# Patient Record
Sex: Female | Born: 1945
Health system: Southern US, Community
[De-identification: ages and names within clinical notes are randomized; demographics above are authoritative.]

## PROBLEM LIST (undated history)

## (undated) DIAGNOSIS — I10 Essential (primary) hypertension: Secondary | ICD-10-CM

## (undated) DIAGNOSIS — S62102A Fracture of unspecified carpal bone, left wrist, initial encounter for closed fracture: Secondary | ICD-10-CM

## (undated) DIAGNOSIS — F329 Major depressive disorder, single episode, unspecified: Secondary | ICD-10-CM

## (undated) DIAGNOSIS — J449 Chronic obstructive pulmonary disease, unspecified: Secondary | ICD-10-CM

## (undated) DIAGNOSIS — S82892B Other fracture of left lower leg, initial encounter for open fracture type I or II: Secondary | ICD-10-CM

## (undated) DIAGNOSIS — M1990 Unspecified osteoarthritis, unspecified site: Secondary | ICD-10-CM

## (undated) DIAGNOSIS — K219 Gastro-esophageal reflux disease without esophagitis: Secondary | ICD-10-CM

## (undated) DIAGNOSIS — E785 Hyperlipidemia, unspecified: Secondary | ICD-10-CM

## (undated) DIAGNOSIS — F32A Depression, unspecified: Secondary | ICD-10-CM

## (undated) DIAGNOSIS — M719 Bursopathy, unspecified: Secondary | ICD-10-CM

## (undated) DIAGNOSIS — R55 Syncope and collapse: Secondary | ICD-10-CM

## (undated) DIAGNOSIS — I2510 Atherosclerotic heart disease of native coronary artery without angina pectoris: Secondary | ICD-10-CM

## (undated) HISTORY — DX: Other fracture of left lower leg, initial encounter for open fracture type I or II: S82.892B

## (undated) HISTORY — PX: TOTAL KNEE ARTHROPLASTY: SHX125

## (undated) HISTORY — PX: CARDIAC CATHETERIZATION: SHX172

## (undated) HISTORY — DX: Hyperlipidemia, unspecified: E78.5

## (undated) HISTORY — PX: NECK SURGERY: SHX720

## (undated) HISTORY — DX: Fracture of unspecified carpal bone, left wrist, initial encounter for closed fracture: S62.102A

## (undated) HISTORY — PX: VAGINAL HYSTERECTOMY: SUR661

## (undated) HISTORY — DX: Unspecified osteoarthritis, unspecified site: M19.90

## (undated) HISTORY — DX: Gastro-esophageal reflux disease without esophagitis: K21.9

## (undated) HISTORY — DX: Depression, unspecified: F32.A

## (undated) HISTORY — DX: Major depressive disorder, single episode, unspecified: F32.9

## (undated) HISTORY — DX: Bursopathy, unspecified: M71.9

## (undated) HISTORY — DX: Morbid (severe) obesity due to excess calories: E66.01

---

## 1999-06-17 ENCOUNTER — Other Ambulatory Visit: Admission: RE | Admit: 1999-06-17 | Discharge: 1999-06-17 | Payer: Self-pay | Admitting: Internal Medicine

## 2000-02-11 ENCOUNTER — Encounter: Admission: RE | Admit: 2000-02-11 | Discharge: 2000-02-11 | Payer: Self-pay

## 2000-02-19 ENCOUNTER — Encounter: Payer: Self-pay | Admitting: Neurosurgery

## 2000-02-20 ENCOUNTER — Encounter: Payer: Self-pay | Admitting: Neurosurgery

## 2000-02-20 ENCOUNTER — Inpatient Hospital Stay (HOSPITAL_COMMUNITY): Admission: RE | Admit: 2000-02-20 | Discharge: 2000-02-21 | Payer: Self-pay | Admitting: Neurosurgery

## 2000-04-27 ENCOUNTER — Encounter: Admission: RE | Admit: 2000-04-27 | Discharge: 2000-04-27 | Payer: Self-pay | Admitting: Neurosurgery

## 2000-04-27 ENCOUNTER — Encounter: Payer: Self-pay | Admitting: Neurosurgery

## 2000-05-07 ENCOUNTER — Encounter: Admission: RE | Admit: 2000-05-07 | Discharge: 2000-05-18 | Payer: Self-pay | Admitting: Neurosurgery

## 2001-06-29 ENCOUNTER — Encounter: Payer: Self-pay | Admitting: *Deleted

## 2001-07-01 ENCOUNTER — Inpatient Hospital Stay (HOSPITAL_COMMUNITY): Admission: RE | Admit: 2001-07-01 | Discharge: 2001-07-07 | Payer: Self-pay | Admitting: *Deleted

## 2001-07-01 ENCOUNTER — Encounter: Payer: Self-pay | Admitting: *Deleted

## 2001-09-17 ENCOUNTER — Ambulatory Visit (HOSPITAL_COMMUNITY): Admission: RE | Admit: 2001-09-17 | Discharge: 2001-09-17 | Payer: Self-pay | Admitting: *Deleted

## 2001-11-25 ENCOUNTER — Encounter: Admission: RE | Admit: 2001-11-25 | Discharge: 2001-11-25 | Payer: Self-pay | Admitting: Internal Medicine

## 2001-11-25 ENCOUNTER — Encounter: Payer: Self-pay | Admitting: Internal Medicine

## 2002-01-27 ENCOUNTER — Ambulatory Visit (HOSPITAL_COMMUNITY): Admission: RE | Admit: 2002-01-27 | Discharge: 2002-01-27 | Payer: Self-pay | Admitting: Internal Medicine

## 2002-01-27 ENCOUNTER — Encounter: Payer: Self-pay | Admitting: Internal Medicine

## 2004-02-06 ENCOUNTER — Emergency Department (HOSPITAL_COMMUNITY): Admission: EM | Admit: 2004-02-06 | Discharge: 2004-02-07 | Payer: Self-pay | Admitting: Emergency Medicine

## 2004-02-16 ENCOUNTER — Encounter: Admission: RE | Admit: 2004-02-16 | Discharge: 2004-02-16 | Payer: Self-pay | Admitting: Internal Medicine

## 2004-02-29 ENCOUNTER — Ambulatory Visit (HOSPITAL_COMMUNITY): Admission: RE | Admit: 2004-02-29 | Discharge: 2004-02-29 | Payer: Self-pay | Admitting: Internal Medicine

## 2005-02-11 ENCOUNTER — Encounter: Admission: RE | Admit: 2005-02-11 | Discharge: 2005-02-11 | Payer: Self-pay | Admitting: Internal Medicine

## 2005-11-12 ENCOUNTER — Encounter: Admission: RE | Admit: 2005-11-12 | Discharge: 2005-11-12 | Payer: Self-pay | Admitting: Internal Medicine

## 2007-11-09 ENCOUNTER — Emergency Department (HOSPITAL_COMMUNITY): Admission: EM | Admit: 2007-11-09 | Discharge: 2007-11-09 | Payer: Self-pay | Admitting: Emergency Medicine

## 2008-02-21 ENCOUNTER — Inpatient Hospital Stay (HOSPITAL_COMMUNITY): Admission: EM | Admit: 2008-02-21 | Discharge: 2008-02-23 | Payer: Self-pay | Admitting: Emergency Medicine

## 2008-06-07 ENCOUNTER — Encounter: Admission: RE | Admit: 2008-06-07 | Discharge: 2008-07-12 | Payer: Self-pay | Admitting: Internal Medicine

## 2008-08-25 ENCOUNTER — Encounter (INDEPENDENT_AMBULATORY_CARE_PROVIDER_SITE_OTHER): Payer: Self-pay | Admitting: *Deleted

## 2008-08-25 ENCOUNTER — Ambulatory Visit (HOSPITAL_COMMUNITY): Admission: RE | Admit: 2008-08-25 | Discharge: 2008-08-25 | Payer: Self-pay | Admitting: *Deleted

## 2009-02-25 ENCOUNTER — Emergency Department (HOSPITAL_COMMUNITY): Admission: EM | Admit: 2009-02-25 | Discharge: 2009-02-25 | Payer: Self-pay | Admitting: Emergency Medicine

## 2009-07-16 ENCOUNTER — Encounter: Admission: RE | Admit: 2009-07-16 | Discharge: 2009-07-16 | Payer: Self-pay | Admitting: Internal Medicine

## 2009-09-09 ENCOUNTER — Encounter: Admission: RE | Admit: 2009-09-09 | Discharge: 2009-09-09 | Payer: Self-pay | Admitting: Internal Medicine

## 2009-09-17 ENCOUNTER — Encounter: Admission: RE | Admit: 2009-09-17 | Discharge: 2009-09-17 | Payer: Self-pay | Admitting: Neurosurgery

## 2009-11-20 ENCOUNTER — Encounter: Admission: RE | Admit: 2009-11-20 | Discharge: 2009-11-20 | Payer: Self-pay | Admitting: Neurosurgery

## 2010-06-27 ENCOUNTER — Encounter
Admission: RE | Admit: 2010-06-27 | Discharge: 2010-06-27 | Payer: Self-pay | Source: Home / Self Care | Admitting: Internal Medicine

## 2010-11-12 ENCOUNTER — Other Ambulatory Visit: Payer: Self-pay | Admitting: Internal Medicine

## 2010-11-12 DIAGNOSIS — D1803 Hemangioma of intra-abdominal structures: Secondary | ICD-10-CM

## 2010-11-13 ENCOUNTER — Ambulatory Visit
Admission: RE | Admit: 2010-11-13 | Discharge: 2010-11-13 | Disposition: A | Discharge status: Home / Self Care | Payer: Medicare Other | Source: Ambulatory Visit | Attending: Internal Medicine | Admitting: Internal Medicine

## 2010-11-13 DIAGNOSIS — D1803 Hemangioma of intra-abdominal structures: Secondary | ICD-10-CM

## 2010-11-15 ENCOUNTER — Other Ambulatory Visit: Payer: Self-pay | Admitting: Internal Medicine

## 2010-11-15 ENCOUNTER — Ambulatory Visit
Admission: RE | Admit: 2010-11-15 | Discharge: 2010-11-15 | Disposition: A | Discharge status: Home / Self Care | Payer: PRIVATE HEALTH INSURANCE | Source: Ambulatory Visit | Attending: Internal Medicine | Admitting: Internal Medicine

## 2010-11-15 DIAGNOSIS — R1011 Right upper quadrant pain: Secondary | ICD-10-CM

## 2010-11-17 ENCOUNTER — Emergency Department (HOSPITAL_COMMUNITY): Payer: Medicare Other

## 2010-11-17 ENCOUNTER — Observation Stay (HOSPITAL_COMMUNITY)
Admission: EM | Admit: 2010-11-17 | Discharge: 2010-11-18 | Disposition: A | Discharge status: Home / Self Care | Payer: Medicare Other | Attending: Internal Medicine | Admitting: Internal Medicine

## 2010-11-17 DIAGNOSIS — R61 Generalized hyperhidrosis: Secondary | ICD-10-CM | POA: Insufficient documentation

## 2010-11-17 DIAGNOSIS — R0989 Other specified symptoms and signs involving the circulatory and respiratory systems: Secondary | ICD-10-CM | POA: Insufficient documentation

## 2010-11-17 DIAGNOSIS — E669 Obesity, unspecified: Secondary | ICD-10-CM | POA: Insufficient documentation

## 2010-11-17 DIAGNOSIS — K219 Gastro-esophageal reflux disease without esophagitis: Secondary | ICD-10-CM | POA: Insufficient documentation

## 2010-11-17 DIAGNOSIS — R0609 Other forms of dyspnea: Secondary | ICD-10-CM | POA: Insufficient documentation

## 2010-11-17 DIAGNOSIS — M79609 Pain in unspecified limb: Secondary | ICD-10-CM | POA: Insufficient documentation

## 2010-11-17 DIAGNOSIS — I10 Essential (primary) hypertension: Secondary | ICD-10-CM | POA: Insufficient documentation

## 2010-11-17 DIAGNOSIS — E039 Hypothyroidism, unspecified: Secondary | ICD-10-CM | POA: Insufficient documentation

## 2010-11-17 DIAGNOSIS — E785 Hyperlipidemia, unspecified: Secondary | ICD-10-CM | POA: Insufficient documentation

## 2010-11-17 DIAGNOSIS — R0789 Other chest pain: Principal | ICD-10-CM | POA: Insufficient documentation

## 2010-11-17 DIAGNOSIS — R110 Nausea: Secondary | ICD-10-CM | POA: Insufficient documentation

## 2010-11-17 LAB — DIFFERENTIAL
Lymphocytes Relative: 27 % (ref 12–46)
Lymphs Abs: 3 10*3/uL (ref 0.7–4.0)
Monocytes Absolute: 0.6 10*3/uL (ref 0.1–1.0)
Monocytes Relative: 6 % (ref 3–12)
Neutro Abs: 7.1 10*3/uL (ref 1.7–7.7)

## 2010-11-17 LAB — TSH: TSH: 3.066 u[IU]/mL (ref 0.350–4.500)

## 2010-11-17 LAB — CBC
HCT: 43.8 % (ref 36.0–46.0)
Hemoglobin: 15.3 g/dL — ABNORMAL HIGH (ref 12.0–15.0)
Hemoglobin: 14.9 g/dL (ref 12.0–15.0)
MCH: 30.1 pg (ref 26.0–34.0)
MCHC: 34.9 g/dL (ref 30.0–36.0)
RBC: 5 MIL/uL (ref 3.87–5.11)

## 2010-11-17 LAB — BASIC METABOLIC PANEL
BUN: 14 mg/dL (ref 6–23)
CO2: 28 mEq/L (ref 19–32)
Calcium: 9.4 mg/dL (ref 8.4–10.5)
GFR calc Af Amer: >60 mL/min (ref >60)
GFR calc non Af Amer: >60 mL/min (ref >60)
Glucose, Bld: 136 mg/dL — ABNORMAL HIGH (ref 70–99)

## 2010-11-17 LAB — CK TOTAL AND CKMB (NOT AT ARMC)
CK, MB: 1.4 ng/mL (ref 0.3–4.0)
Relative Index: INVALID (ref 0.0–2.5)

## 2010-11-17 LAB — D-DIMER, QUANTITATIVE: D-Dimer, Quant: 0.88 ug/mL-FEU — ABNORMAL HIGH (ref 0.00–0.48)

## 2010-11-17 LAB — TROPONIN I: Troponin I: 0.03 ng/mL (ref 0.00–0.06)

## 2010-11-17 LAB — BRAIN NATRIURETIC PEPTIDE: Pro B Natriuretic peptide (BNP): <30 pg/mL (ref 0.0–100.0)

## 2010-11-18 ENCOUNTER — Observation Stay (HOSPITAL_COMMUNITY): Payer: Medicare Other

## 2010-11-18 DIAGNOSIS — M79609 Pain in unspecified limb: Secondary | ICD-10-CM

## 2010-11-18 DIAGNOSIS — R079 Chest pain, unspecified: Secondary | ICD-10-CM

## 2010-11-18 LAB — LIPID PANEL
HDL: 44 mg/dL (ref >39)
Total CHOL/HDL Ratio: 4.9 RATIO

## 2010-11-18 MED ORDER — TECHNETIUM TO 99M ALBUMIN AGGREGATED
6.0000 | Freq: Once | INTRAVENOUS | Status: AC | PRN
  Administered 2010-11-18: 6 via INTRAVENOUS

## 2010-11-18 MED ORDER — XENON XE 133 GAS
10.0000 | GAS_FOR_INHALATION | Freq: Once | RESPIRATORY_TRACT | Status: AC | PRN
  Administered 2010-11-18: 10 via RESPIRATORY_TRACT

## 2010-11-22 NOTE — Consult Note (Signed)
NAME:  KALEB, LINQUIST                 ACCOUNT NO.:  [id number]  MEDICAL RECORD NO.:  [id number]           PATIENT TYPE:  O  LOCATION:  2039                         FACILITY:  [facility]  PHYSICIAN:  Noralyn Pick. Eden Emms, MD, FACCDATE OF BIRTH:  [birth year]  DATE OF CONSULTATION: DATE OF DISCHARGE:                                CONSULTATION   Ms. Goyal is a 65-year patient of Dr. Pearson Grippe.  She was admitted to the hospital on November 17, 2010, for atypical chest pain.  The patient has had a history since Thursday she indicates of constant chest pain and the pain she says was unrelenting.  It had a positional component, worse on her left side.  There is no pleuritic component.  Nothing she did seem to make the pain better.  There was no associated shortness of breath, although she is chronically mildly dyspneic with exertion due to her weight.  The patient has mild lower extremity edema at baseline and status post 2 knee replacements.  There has been not any change in the lower extremity edema or leg girth.  She also has significant GI symptoms including some right upper and right mid quadrant pain with nausea.  She apparently saw Dr. Elmyra Ricks PA on the 17th and was referred for an outpatient abdominal ultrasound, which showed hepatic steatosis with hyperechoic lesions in the spleen, gallbladder itself was unremarkable.  She subsequently saw Dr. Selena Batten on the 19th and was going to be referred to a cardiologist but continued to have chest pain and was brought to the hospital by her family.  Since in the hospital, her pain has resolved spontaneously.  She is ruled out for myocardial infarction.  Her V/Q scan was low likelihood of PE, and there were no acute cardiopulmonary abnormalities.  On her chest x-ray, question of previous right apical lung mass on prior exam is not identified  In talking to the patient, she has had GI problems in the past.  She has had GERD with an esophageal dilatation and  this has caused nausea and some pains in her upper epigastrium area and chest in the past.  A 10-point review of systems is otherwise negative and in particular, there has been no melena, no hemoptysis, no syncope, no palpitations.  PAST MEDICAL HISTORY:  Hypertension, hyperlipidemia, arthritis, central obesity, GERD.  PAST SURGICAL HISTORY:  Esophageal dilatation, bilateral knee replacements, diskectomy of the neck, hysterectomy.  SOCIAL HISTORY:  The patient is a nonsmoker, nondrinker.  She is fairly sedentary.  ALLERGIES:  She is allergic to PENICILLIN and SULFA.  She reports having anaphylaxis with IV CONTRAST.  FAMILY HISTORY:  Negative for premature coronary artery disease.  MEDICATIONS:  Here in the hospital including an aspirin a day, Lovenox DVT prophylaxis, Protonix 40 a day, and Norco 2 tablets p.o. q.4 h. p.r.n., Zofran 4 mg IV q.6 h.  PHYSICAL EXAMINATION:  GENERAL:  Remarkable for an obese female in no distress. VITAL SIGNS:  Her blood pressure is 140/80, pulse 85 and regular, respiratory rate 14, afebrile, room air sats 93%. HEENT:  Unremarkable. NECK:  Carotids are normal without bruit.  No lymphadenopathy, thyromegaly, or JVP elevation. LUNGS:  Clear.  Good diaphragmatic motion.  No wheezing. CARDIAC:  S1 and S2 with distant heart sounds.  PMI not palpable. ABDOMEN:  Currently benign.  No hepatosplenomegaly, hepatojugular reflux.  No AAA.  No epigastric pain.  No right upper quadrant tenderness. EXTREMITIES:  Distal pulses are intact with trace edema status post bilateral knee replacements. NEUROLOGIC:  Nonfocal. SKIN:  Warm and dry.  No muscular weakness.  EKG shows sinus rhythm with nonspecific ST-T wave changes.  No acute changes, poor baseline tracing due to patient movement.  Lab work is remarkable for LDL cholesterol of 140, negative CPK x2 with negative troponins.  The D-dimer was mildly elevated at 0.88 but V/Q scan was low prob.  White count  mildly elevated at 11.6, hematocrit 43.4.  BNP less than 30 and hematocrit 43.8.  IMPRESSION: 1. Very atypical chest pain, negative enzymes despite fairly constant     pain since Thursday, positional features as well as GI features,     EKG essentially normal with nonspecific changes and negative     enzymes.  The patient can be discharged home.  She can have     outpatient followup with Dr. Selena Batten.  He may want to reevaluate her     and order an outpatient stress test given the patient's size,     either a dobutamine echo or today Lexiscan scan Myoview would     likely be the best test for her since her V/Q scan was negative and     she is painfree.  I think it is reasonable for the primary service     to discharge her home today. 2. Elevated lipids.  Dietary consultation with low carb diet.  Target     LDL in a patient without significant vascular disease would be 130     or less. 3. History of GERD with esophageal dilatation, should probably have GI     followup to consider repeat endoscopy.  This may be related to her     symptoms.     Noralyn Pick. Eden Emms, MD, [facility]     PCN/MEDQ  D:  11/18/2010  T:  11/19/2010  Job:  [id number]  Electronically Signed by Charlton Haws MD [facility] on 11/22/2010 11:30:09 AM

## 2010-11-25 ENCOUNTER — Encounter (HOSPITAL_COMMUNITY): Payer: Medicare Other | Admitting: Radiology

## 2010-11-27 NOTE — H&P (Signed)
NAME:  Shirley Leblanc, Shirley Leblanc NO.:  [id number]  MEDICAL RECORD NO.:  [id number]           PATIENT TYPE:  E  LOCATION:  [facility]                         FACILITY:  [facility]  PHYSICIAN:  Brendia Sacks, MD    DATE OF BIRTH:  1945-12-15  DATE OF ADMISSION:  11/17/2010 DATE OF DISCHARGE:                             HISTORY & PHYSICAL   PRIMARY CARE PHYSICIAN:  Massie Maroon, MD  CHIEF COMPLAINT:  Chest pain.  HISTORY OF PRESENT ILLNESS:  This 65 year old woman presents to the emergency room with a 5-day history of constant chest pain.  Prior to 5 days ago, the patient has never had chest pain before of significance. She has never had any stress testing done and she has no cardiologist. One week ago, she developed right upper to right mid quadrant pain with nausea.  Although she had nausea for months, this pain was new.  She saw Dr. Elmyra Ricks PA on the 17th and was referred for an outpatient abdominal ultrasound which was completed on the 18th.  This did show hepatic steatosis and 3 hyperechoic lesions of the spleen of unclear etiology. The gallbladder was unremarkable.  About 2 hours after the abdominal ultrasound on the 18th, she developed chest pain.  This was retrosternal in nature with some radiation to her left shoulder and left arm.  She had intermittent shortness of breath which was not necessarily associated with exertion although she did have worsening shortness of breath with exertion.  Pain lasted in intensity about 2 hours although it has been present since Wednesday.  She saw Dr. Selena Batten on the 19th and at that time an EKG was done and was, per the patient, noted to be abnormal and she was referred in the outpatient setting to Cardiology.  However, her pain persisted until today and her daughter insisted that she come to the emergency room for further evaluation.  At its maximum intensity, the pain has been a 5/10, associated with nausea but no vomiting.  She took some  aspirin last night which seemed to help and she was given aspirin in the emergency room which completely relieved her pain.  She is currently painfree.  She has no shortness of breath now.  She has had no diaphoresis.  REVIEW OF SYSTEMS:  Negative for fever, changes to her vision, sore throat, rash, muscle aches, dysuria, or bleeding.  She has had nausea for months now.  She denies any recent travel, prolonged immobility, or risk factors for venous thromboembolism.  PAST MEDICAL HISTORY: 1. Hypertension. 2. Hyperlipidemia. 3. Arthritis. 4. Obesity. 5. GERD. 6. Denies history of venous thromboembolism, MI, CVA, and diabetes.  PAST SURGICAL HISTORY: 1. Esophageal dilatation. 2. Bilateral total knee replacements. 3. Neck diskectomy. 4. Hysterectomy. 5. BTL.  SOCIAL HISTORY:  Nonsmoker, nondrinker.  ALLERGIES:  To PENICILLIN and SULFA which cause hives but not anaphylaxis and an allergy to IV CONTRAST which she describes as being anaphylaxis.  FAMILY HISTORY:  Father had heart disease in his 72s although the age of onset is unknown.  MEDICATIONS:  Pending at this time.  PHYSICAL EXAMINATION:  VITAL SIGNS:  Initial blood pressure 141/92, repeat 99/61, pulse 93-103, respirations 17, temperature 98.1, sat 97%. GENERAL:  Well-developed, well-nourished woman sitting up in bed, in no acute distress.  She appears calm and comfortable and has no pain. HEENT:  Eyes: She wears glasses.  Pupils equal, round, reactive to light.  Lids, irises, and conjunctivae appear unremarkable.  ENT: Hearing is grossly normal.  Lips and tongue appear unremarkable.  She does have dentures. NECK:  Supple.  No lymphadenopathy or masses.  No thyromegaly. CHEST:  Clear to auscultation bilaterally.  No wheezes, rales, or rhonchi.  There is normal respiratory effort. EXTREMITIES:  There is no significant lower extremity edema. ABDOMEN:  Soft, nontender, nondistended.  No masses are appreciated. SKIN:   Normal without rash or indurations.  Nontender to palpation. Tone and strength in the upper and extremities is 5/5 and symmetric. Digits and nails in the upper and lower extremities appear unremarkable. PSYCHIATRY:  Grossly normal mood and affect.  Speech is fluent and appropriate.  ANCILLARY STUDIES:  Independent review of EKG shows sinus tachycardia with a rate of 100, left axis deviation, no acute changes are seen.  IMAGING: 1. Chest x-ray April 22:  Interval possible right apical mass.     Consider further evaluation. 2. CT of the abdomen and pelvis performed prior to admission April 20:     Small fat-containing hernias but was described with erythematous     change of the abdominal aorta with no other acute abnormality seen. 3. Abdominal ultrasound performed April 18:  Probable hepatic     steatosis. 4. Hyperechoic lesions of the spleen.  Possibly multiple splenic     hemangiomata.  Consider MRI for further assessment as metastases     cannot be excluded. 5. Pelvic ultrasound, April 18:  No acute findings identified.  Status     post hysterectomy.  LABORATORY STUDIES: 1. CBC notable for modest leukocytosis of 11.2, hemoglobin is 15.3,     platelet count within normal limits. 2. Basic metabolic panel is notable for potassium of 3.3 and a random     glucose of 136.  First set of cardiac enzymes are negative.  BNP is     less than 30.  ASSESSMENT AND PLAN:  This is a 65 year old woman who presents to the emergency room with 5 days of constant chest pain and initial negative cardiac markers. 1. Atypical chest pain.  The patient has some features of typical     chest pain but also of atypical chest pain.  She has had 5 days of     chest pain with negative cardiac markers.  She is completely     painfree here in the emergency room and has already received     aspirin.  Clinically, I suspect gastroesophageal reflux as she     notes that she has frequent symptoms of this and she  has had an     esophageal dilatation in the past.  However, she has had risk     factors including hypertension and hyperlipidemia, so we will admit     for rule out with cardiac enzymes.  Hopefully can arrange for     stress testing in the morning.  If this is negative, would consider     pursuing outpatient gastroenterologic consultation.  We will place     on PPI therapy here. 2. Gastroesophageal reflux disease with frequent symptoms.  Plan as     above. 3. Splenic lesions seen on imaging, April 18.  Defer further  evaluation to the outpatient setting. 4. Abnormal chest x-ray.  The patient has a history of IV dye     anaphylaxis by report.  We will repeat a 2-view chest x-ray to see     whether this apical lesion persists and if so consider noncontrast     CT. 5. History of hypertension and hyperlipidemia.  These appear to be     stable at this point.  Her systolic blood pressure is in the high     90s.  At this point, she is completely asymptomatic.  We will     continue to follow this. 6. Hypokalemia.  We will replete. 7. The patient has no risk factors for venous thromboembolism.  We     will check a D-dimer as my suspicion is low.  If this is positive,     would consider a V/Q scan but at this point, given low clinical     suspicion, we will simply place her on prophylactic Lovenox.     Brendia Sacks, MD     DG/MEDQ  D:  11/17/2010  T:  11/17/2010  Job:  [id number]  cc:   Massie Maroon, MD  Electronically Signed by Brendia Sacks  on 11/27/2010 09:58:01 PM

## 2010-12-04 NOTE — Discharge Summary (Signed)
NAME:  Shirley Leblanc, Shirley Leblanc                 ACCOUNT NO.:  [id number]  MEDICAL RECORD NO.:  [id number]           PATIENT TYPE:  O  LOCATION:  2039                         FACILITY:  [facility]  PHYSICIAN:  Altha Harm, MDDATE OF BIRTH:  [birth year]  DATE OF ADMISSION:  11/17/2010 DATE OF DISCHARGE:  11/18/2010                              DISCHARGE SUMMARY   DISCHARGE DISPOSITION:  Home.  FINAL DISCHARGE DIAGNOSES: 1. Chest pain, atypical. 2. Hyperlipidemia. 3. Hypertension. 4. Arthritis. 5. Lower extremity pain. 6. Obesity. 7. Gastroesophageal reflux disease. 8. Hypothyroidism.  DISCHARGE MEDICATIONS: 1. Nitroglycerin 0.4 mg sublingual q.5 minutes up to 3 doses as needed     for chest pain. 2. Albuterol inhaler 2 puffs q.4 h p.r.n. shortness of breath. 3. Aspirin 81 mg p.o. daily. 4. Crestor 5 mg p.o. daily. 5. Hydrochlorothiazide 25 mg p.o. daily. 6. Vicodin 5/500 one tab p.o. q.6 h p.r.n. pain. 7. Prilosec 20 mg p.o. daily.8. Promethazine 25 mg p.o. q.6 h p.r.n. nausea. 9. Quinapril 40 mg p.o. daily. 10.Synthroid 50 mcg p.o. daily. 11.Vitamin D3, 4000 units p.o. daily. 12.Zoloft 150 mg p.o. daily.  CONSULTANTS:  [facility].  PROCEDURES:  None.  DIAGNOSTIC STUDIES: 1. V/Q scan which was low probability for PE. 2. Doppler ultrasound of lower extremity showed no evidence of DVTs. 3. Two-view portable chest x-ray which shows no evidence of     cardiopulmonary disease.  PRIMARY CARE PHYSICIAN:  Massie Maroon, MD  CODE STATUS:  Full code.  ALLERGIES: 1. CODEINE. 2. SULFA. 3. PENICILLIN. 4. CONTRAST MEDIA.  CHIEF COMPLAINT:  Chest pain.  HISTORY OF PRESENT ILLNESS:  Please refer to the H and P by Dr. Irene Limbo for details of the HPI.  However, in short, this is a 65 year old lady who presents with atypical chest pain in the setting of known hypertension and hyperlipidemia.  The patient had resolution of her chest pain over the course of  the hospitalization.  Cardiac enzymes were cycled and there were all found to be normal.  The patient did complain of some lower extremity pain and right-sided chest.  A D-dimer was obtained which was mildly elevated. The V/Q scan was obtained which was found to be negative as well as a lower of the lower extremity Doppler which was found to be negative for DVTs.  The patient was seen by Cardiology who recommended that she can have a stress test as an outpatient as she was low risk.  The patient is being discharged home to follow up with her primary care physician within 5 days and to follow up as an outpatient scheduled for stress test as an outpatient setting.  Otherwise, the patient has remained stable.  As of today, her chest pain is completely resolved.  Condition at time of discharge is stable.  PHYSICAL EXAMINATION:  VITAL SIGNS:  Temperature is 97.6, heart rate 85, blood pressure 140/83, respiratory rate 18, and O2 saturation of 98% on room air. HEENT:  She is normocephalic and atraumatic.  Pupils equally round and reactive to light and accommodation.  Extraocular movements are intact. Oropharynx is moist.  No exudate, erythema,  or lesions noted. LUNGS:  Clear to auscultation.  No wheezing or rhonchi noted. CARDIOVASCULAR:  She has got normal S1 and S2.  No murmurs, rubs, or gallops noted.  She has normal sinus rhythm on the monitor. ABDOMEN:  Obese, soft, nontender, and nondistended.  No masses.  No hepatosplenomegaly. EXTREMITIES:  No clubbing, cyanosis, or edema. NEUROLOGIC:  The patient has no focal neurological deficits.  Cranial nerves II through XII grossly intact. PSYCHIATRIC:  She is alert and oriented x3.  Good insight and cognition. Good recent and remote recall.  DISCHARGE INSTRUCTIONS:  The patient is to follow up with her primary care physician, Dr. Selena Batten, in 5 days.  In terms of Cardiology, an appointment will be made for the outpatient stress test at  [facility] prior to discharge.  DIETARY RESTRICTIONS:  The patient should be on a heart-healthy diet.  Total Time for Discharge including Face-to-Face time approximately 45 minutes.     Altha Harm, MD     MAM/MEDQ  D:  11/18/2010  T:  11/19/2010  Job:  [id number]  cc:   Massie Maroon, MD  Electronically Signed by Marthann Schiller MD on 12/04/2010 02:08:52 PM

## 2010-12-10 NOTE — H&P (Signed)
NAME:  Shirley, Leblanc                 ACCOUNT NO.:  [id number]   MEDICAL RECORD NO.:  [id number]          PATIENT TYPE:  INP   LOCATION:  0112                         FACILITY:  [facility]   PHYSICIAN:  Hind I Elsaid, MD      DATE OF BIRTH:  03-11-1946   DATE OF ADMISSION:  02/21/2008  DATE OF DISCHARGE:                              HISTORY & PHYSICAL   CHIEF COMPLAINT:  Generalized body weakness, nausea, vomiting, and  diarrhea.   HISTORY OF PRESENT ILLNESS:  This is a 65 year old female with a history  of hypertension.  Patient apparently went to the beach two weeks ago.  At that time, she admitted she started feeling weak and coughing.  Lots  of her family members were there with acute bronchitis but they  improved.  Her symptoms started one week ago, mainly with cough,  nonproductive.  Also onset of nausea, vomiting, and diarrhea.  The  patient used to vomit 4-5 times daily, mainly clear vomitus, associated  with diarrhea, but no blood.  No mucus.  Patient has contacted Dr. Selena Batten.  He ran blood tests.  Everything seemed to be under good control,  including white blood cells, renal function, and thyroid function tests.  At that time, they only found her TSH is 7.5.  At that time, the patient  diagnosed with acute bronchitis.  Patient prescribed antibiotics.  The  patient cannot exactly remember the antibiotic's name but mentioned the  antibiotic could be Biaxin.  Four days from that day, the patient  contacted Dr. Selena Batten again and prescribed a stronger antibiotic.  No  significant improvement on her condition.   The patient today was on her way to work and started feeling sick again  with generalized pain, nausea, and vomiting and had one episode of  diarrhea.  Did call Dr. Selena Batten but he recommend to come to the emergency  room for evaluation.  In the emergency room, the patient found to be  hypotensive with blood pressure of 83/53 and some raise in her  creatinine.  Was advised to be admitted  for evaluation for dehydration.  The patient denies any abdominal pain.  Continued to complain of nausea  and vomiting.  According to her, the diarrhea significantly improved  since last week.  The last time she had diarrhea once a day in the  morning, and since coming to the emergency room, no further diarrhea.  Patient denies shortness of breath.  Denies any chest pain.  Continues  to complain of dry cough.  Denies any fever.  Denies any chills.  Continues to have sweating all day long.  The patient denies any dysuria  or burning micturition.  Denies any normal rash.  Patient denies any  dysuria or burning micturition.  Denies any abnormal rash.  Denies any  headache.  Denies any neck pain.   PAST MEDICAL HISTORY:  1. Hypertension.  2. Hyperlipidemia.  3. Osteoarthritis.  4. Obesity.  5. Gastroesophageal reflux disease.   MEDICATIONS:  1. Accupril.  2. Hydrochlorothiazide.  3. Zoloft 200 mg.  4. Vicodin.   ALLERGIES:  1. PENICILLIN.  2. SULFA.   PAST SURGICAL HISTORY:  1. Hysterectomy.  2. Tubal ligation.  3. Two knee surgeries, back surgeries.  4. Tonsillectomy and adenoidectomy when she was young.   SOCIAL HISTORY:  She lives with her son and his wife and two  grandchildren.  She denies smoking.  Denies any drinking.  Denies any IV  drug abuse.   REVIEW OF SYSTEMS:  As reviewed in the HPI.   FAMILY HISTORY:  Noncontributory.   PHYSICAL EXAMINATION:  VITAL SIGNS:  Temperature 98.1, blood pressure  94/62 after IV fluids, pulse rate 81, respiratory rate 16, saturating  100% on room air.  HEENT:  Patient not in respiratory distress or shortness of breath but  in pain.  Pupils are equal, round and reactive to light and  accommodation.  Extraocular muscle movement within normal.  Mucosa  moist.  NECK:  Supple.  No JVD.  No lymphadenopathy.  HEART:  S1 and S2 without any murmur.  LUNGS:  Normal fascicular breathing with equal air entry.  ABDOMEN:  Obese, soft,  nontender.  Bowel sounds positive.  EXTREMITIES :  Peripheral pulses intact.  No lower limb edema.  CNS:  Patient alert and oriented x3 without focal neurological finding.   BLOOD WORK:  Urine microscopy 0-2, sodium 138, potassium 3.1, chloride  94, CO2 32, glucose 120, BUN 22, creatinine 1.61.  Total bilirubin 0.99,  alkaline phosphatase 76, AST 29, ALT 26.  Total protein 7.1.  CBC:  White blood cells 12.1, hemoglobin 14.7, hematocrit 42.2, platelets 285.   CHEST X-RAY:  No active cardiopulmonary disease.   ASSESSMENT/PLAN:  This is a 65 year old female who presented with:  1. Acute bronchitis:  Will treat with oxygen.  Will hold off on      antibiotic.  Patient already received enough antibiotic.  Chest x-      ray did not show any evidence of pneumonia.  2. Nausea, vomiting, and diarrhea:  Will treat with IV fluids.      Symptom management, mainly antiemetics.  Get stool workup.  Would      hold off on antibiotics at this time.  3. Dehydration:  IV fluids.  4. Hypokalemia replacement.  5. Hypotension:  Will bolus with IV fluids and see if responds to IV      fluids.  6. Deep venous thrombosis/gastrointestinal prophylaxis.      Hind Bosie Helper, MD  Electronically Signed     HIE/MEDQ  D:  02/21/2008  T:  02/21/2008  Job:  [id number]

## 2010-12-10 NOTE — Op Note (Signed)
NAME:  Shirley Leblanc, Shirley Leblanc                 ACCOUNT NO.:  [id number]   MEDICAL RECORD NO.:  [id number]          PATIENT TYPE:  AMB   LOCATION:  ENDO                         FACILITY:  [facility]   PHYSICIAN:  Georgiana Spinner, M.D.    DATE OF BIRTH:  1946-01-10   DATE OF PROCEDURE:  08/25/2008  DATE OF DISCHARGE:                               OPERATIVE REPORT   PROCEDURE:  Upper endoscopy with dilation and biopsy.   INDICATIONS:  Dysphagia.   ANESTHESIA:  Fentanyl 100 mcg, Versed 10 mg, Benadryl 25 mg.   PROCEDURE:  With the patient mildly sedated in the left lateral  decubitus position, the Pentax videoscopic endoscope was inserted in the  mouth passed under direct vision through the esophagus, which appeared  normal although the distal esophagus was not well seen due to some edema  there, but we advanced the endoscope into the stomach.  Fundus, body,  antrum,  duodenal bulb, second portion of duodenum were visualized.  All  appeared normal.  From this point, the endoscope was slowly withdrawn  taking circumferential views of duodenal mucosa until the endoscope been  pulled back into stomach and placed in retroflexion to view the stomach  from below.  The endoscope was then straightened.  A guidewire was  passed the endoscope was withdrawn.  Subsequently over the guidewire, 15  and 17 Savary dilators were passed with fluoroscopic control rather  easily.  No heme was seen on either dilator.  With the latter the  guidewire was removed, the endoscope was reinserted into the stomach and  pulled back.  The distal esophagus showed changes of possible Barrett's  photographed and biopsied.  We then withdrew the endoscope taking  circumferential views of remaining esophageal mucosa.  The patient's  vital signs, pulse oximeter remained stable.  The patient tolerated  procedure well without apparent complications.   FINDINGS:  Question of Barrett's esophagus.  Dilation of distal  esophagus to 15 and 17  Savary under fluoroscopic control.   PLAN:  Await biopsy report and clinical response.  The patient will call  me for results and follow-up with me as an outpatient.           ______________________________  Georgiana Spinner, M.D.     GMO/MEDQ  D:  08/25/2008  T:  08/25/2008  Job:  [id number]

## 2010-12-10 NOTE — Discharge Summary (Signed)
NAME:  Shirley Leblanc, Shirley Leblanc                 ACCOUNT NO.:  [id number]   MEDICAL RECORD NO.:  [id number]          PATIENT TYPE:  INP   LOCATION:  1440                         FACILITY:  [facility]   PHYSICIAN:  Herbie Saxon, MDDATE OF BIRTH:  12-07-1945   DATE OF ADMISSION:  02/21/2008  DATE OF DISCHARGE:  02/23/2008                               DISCHARGE SUMMARY   DISCHARGE DIAGNOSES:  1. Acute bronchitis, improved.  2. Acute gastroenteritis, improved.  3. Dehydration, improved.  4. Hypokalemia, improved.  5. Hypotension, resolved.  6. History of hyperlipidemia.  7. Osteoarthritis.  8. Morbid obesity.  9. Gastroesophageal reflux disease.   HOSPITAL COURSE:  This 65 year old lady was admitted with nausea,  vomiting, diarrhea, generalized body weakness, cough productive of clear  phlegm.  The patient was profoundly hypotensive and dehydrated on  admission.  She has been successfully resuscitated with IV fluid  hydration.  Potassium has been supplemented.  Diuretic is on hold.  The  patient was noticed to have a prolonged QT on EKG.  It is felt it  might  be secondary to hypokalemia, and this has been repleted.  She has been  placed on statins (Zocor) for hyperlipidemia.  She is very anxious to be  discharged, and her primary care physician, Dr. Selena Batten, visited her today.  There is much improvement, no nausea, and her energy level is also  improved.   DISCHARGE CONDITION:  Stable.   DIET:  Heart healthy, low cholesterol.   ACTIVITY:  No restrictions.   FOLLOW UP:  With her primary care physician, Dr. Selena Batten 02/24/2008.  She is  to have a repeat potassium level at that time.   MEDICATIONS ON DISCHARGE:  1. Accupril 2.5 mg daily.  2. HCTZ 0.5 mg daily.  3. Blood pressure medications to be reviewed, and treated as an      outpatient by the primary care physician.  4. KCl 20 mEq daily for the next one week, and this potassium therapy      is also to be reviewed as an outpatient by the  primary care      physician.  5. Continue with the Zoloft 20 mg daily.  6. Start her on Cipro 500 mg b.i.d. for the next three days.  7. Zocor 20 mg q.h.s.  8. Tylenol 60 mg q 6 hours p.r.n.  9. Motrin 40 mg q 8 hours p.r.n.   On examination today, she is an elderly lady, not in acute distress.  Temperature 98, pulse 88, respiratory rate is 18.  Blood pressure is  131/72.  Pupils are equal, reactive to light and accommodation, extraocular  muscles are intact.  Head is atraumatic, normocephalic.  Nasopharynx and  oropharynx are clear.  NECK:  Supple.  There is no elevated JVD, no adenopathy or thyromegaly.  CHEST:  Clinically clear, heart sounds 1 and 2, regular rate and rhythm.  ABDOMEN:  Benign.  She has truncal obesity, no organomegaly.  She is  alert and oriented to time, place and person.  Power is 5 in all limbs, peripheral pulses present, no pedal edema.  Cranial  nerves II-XII are intact.   LABS:  The WBC is 8, hematocrit 39, platelet count is 197.  Chemistry  shows a sodium of 142, potassium 3.4, chloride 103, bicarbonate 13, BUN  11, creatinine 0.8.  Note that the hemoglobin A1c is 6.2  Borderline  hypoglycemia, rule out type 2 diabetes .  This will be followed up in  the outpatient setting.  I noted a CD toxin was negative.  Fecal  lactoferrin was 2.2.  was negative.  T4 was normal.  Urine culture shows  35,000 colonies of multiple bacteria of multiple types.  Blood culture:  No growth to date.  Total lipids are 251, triglyceride 265, LDL 164, HDL  34.   RADIOLOGY:  Chest x-ray on 02/21/2008 showed no active cardiopulmonary  disease.  The patient's  test, and need to follow up with the primary  care physician.  Potassium level  explained to her.  She verbalizes  understanding.      Herbie Saxon, MD  Electronically Signed     MIO/MEDQ  D:  02/23/2008  T:  02/23/2008  Job:  [id number]   cc:   Massie Maroon, MD  Fax: [phone]

## 2010-12-13 NOTE — H&P (Signed)
[place]. [facility]  Patient:    Shirley Leblanc, Shirley Leblanc. Visit Number: [id number] MRN: [id number]          Service Type: Attending:  Reynolds Bowl, M.D. Dictated by:   Reynolds Bowl, M.D.                           History and Physical  INTRODUCTION:  Mrs. Doorn is a 65 year old lady with chief complaint of severe right knee pain.  She was evaluated in this office a month ago and found to have severe osteoarthritis of her knees.  She had previously undergone Synvisc therapy to no avail.  We discussed total knee arthroplasty which she had in her left knee in October 1997.  She feels that that knee now is essentially asymptomatic.  We discussed the fact there are no guarantees.  There could be complications of infection, phlebitis, pulmonary emboli, loosening, pain, limitation of motion, etc. and she would still like to proceed on with total knee arthroplasty.  PAST HISTORY:  ALLERGIES:  PENICILLIN.  PRIOR SURGERY:  Lumbar surgery in 2001.  Left total knee in October 1997.  REVIEW OF SYSTEMS:  Some urinary frequency and questionable burning.  No cardiovascular or GI symptoms.  DAILY MEDICATIONS:  Vicodin, estradiol, Zoloft, Accupril, Norvasc, furosemide.  FURTHER REVIEW OF SYSTEMS:  Patient relates that she is not current with mammograms or Pap smear.  We discussed that and she plan on scheduling that some time in the future.  PHYSICAL EXAMINATION:  VITAL SIGNS:  She is 5 feet 7 inches, 262 pounds.  Blood pressure 144/90, temperature 98.2, pulse 84, respirations 16.  HEENT:  Has upper dentures.  Her extraocular movements are full.  Her tympanic membranes are normal.  NECK:  There is no carotid bruit, no cervical mass.  She moves her neck well.  CHEST:  Volume is good.  Breath sounds are clear.  HEART:  Regular.  No murmurs heard.  ABDOMEN:  Round, soft, nontender.  No palpable masses.  Bowel sounds are present.  VASCULAR:  Femoral pulses are  vague.  There is no murmur heard.  Dorsalis pedis pulse is excellent and the foot is warm.  EXTREMITIES:  There is no leg edema.  The left knee that underwent total knee arthroplasty in October 1997 has 0-135 degrees of motion and is stable.  The right knee motion is from 7-120 degrees.  It is crepitant and it is painful. There is 1+ varus-valgus laxity and a slight amount of effusion.  LABORATORY DATA:  X-rays show a severe osteoarthritis and varus deformity of the right knee.  IMPRESSION: 1. Right knee osteoarthritis with varus deformity. 2. Hypertension. 3. Obesity.  PLAN:  Right total knee arthroplasty as fully discussed with patient; no guarantees were offered.  Today, I wrote prescriptions for her to use postoperatively.  That would be to take Tylox or Vicodin and Coumadin as per protocol.  I wrote a prescription for her to start outpatient physical therapy a little more than two weeks postoperatively and she plans on having home physical therapy prior to that. Dictated by:   Reynolds Bowl, M.D. Attending:  Reynolds Bowl, M.D. DD:  06/29/01 TD:  06/29/01 Job: [id number]

## 2010-12-13 NOTE — Op Note (Signed)
[place]. [facility]  Patient:    Shirley Leblanc, Shirley Leblanc Visit Number: [id number] MRN: [id number]          Service Type: SUR Location: [id number] Attending Physician:  Maryanna Shape Dictated by:   Reynolds Bowl, M.D. Proc. Date: 07/01/01 Admit Date:  07/01/2001                             Operative Report  PREOPERATIVE DIAGNOSIS: Osteoarthritis, right knee, with varus deformity.  POSTOPERATIVE DIAGNOSIS: Osteoarthritis, right knee, with varus deformity.  OPERATION/PROCEDURE: Right total knee arthroplasty.  SURGEON: Reynolds Bowl, M.D.  ASSISTANT: Cammy Copa, M.D.  ANESTHESIA: General via endotracheal tube and postoperative femoral block.  DESCRIPTION OF PROCEDURE: The patient was given general anesthetic and by IV was given 1 g of vancomycin.  A Foley catheter was started.  Arms were placed in a comfortable position and a roll placed under her right hip.  A proximal pneumatic tourniquet was applied over a stockinette and stabilized with a U-drape.  The right lower extremity was prepped from the toe tips to include the edges of the U-drape, then draped in the usual fashion including the use of two Vi-Drapes.  The knee was placed in flexion and a long anterior incision made and carried down to the level of the prepatella bursa.  At that point soft tissue was dissected medially sufficiently to allow opening of the retinaculum medially.  This incision was then carried on down to the medial side of the patellar tendon and proximally the quadriceps tendon was incised, leaving about a 1/4 inch base of the tendon intact.  This then allowed the patella to be everted and the knee flexed.  There were patellar osteophytes, degenerative changes, and there was articular loss with exposure of bone medially, and there were osteophytes around the femur.  I resected part of the fat pad, did partial medial and lateral meniscectomy, then subperiosteally  elevated the medial capsule and collateral ligament to the posterior medial corner.  Likewise, soft tissues were mobilized laterally.  After exposing the femur and the tibia and resecting the cruciates, I then resected the distal femur in five degrees of valgus using an intramedullary guide.  We then used the sizer and established that the femoral size was #7.  We then marked for about four degrees of external rotation and used the guide to cut the various chamfers.  A notch for the Scorpio component was then made using the guides and cutters and impacter.  Attention was then directed to the tibia.  The tibial plateau was resected between 11-12 mm down from the medial tibial plateau using the intramedullary guide and cutting with five degrees of posterior tilt.  Having accomplished all this we then put in trials and felt that there was some lift-off, therefore did posterior releases using a Cobb osteotome and elevating the periosteum from the femur.  Having done this then it was felt that 12 mm thickness tibial bearing insert was required for good stability. At this point we began to have some problems with flexion and extension, having the tibial tray want to internally rotate with flexion and what was felt to be relieved by further release in some posterolateral capsule as well as recessing the popliteus, and then in order to keep it from being completely free I used suture and sutured it to the right soft tissues.  Then, with flexion and extension this  rotation problem seemed to be under control.  There were some lateral femoral patellar bands which were released.  Some of the synovium was tight laterally and that was released.  I dissected a little bit laterally at the level of the prepatella bursa so that I could ream the patella and stand it perpendicular in the femoral trochlea.  A resurfacing patella prosthesis was used by first freeing up soft tissue around the edges of the  patella all the way around, then measuring the patella to be a little more than 21 mm in thickness.  I resected 10-11 mm and used the guide and decided on patella component size #5.  Peg holes were drilled and then with all the components in place the patella seemed to track quite well.  We then removed the trial components again, copiously irrigated with pulsatile lavage, then cemented the tibial tray, then the femoral component, looked for and removed all excess cement and while this was setting had a trial 12 mm tibial bearing in place and allowed the knee to full in extension and rest that way, and having done that we then went on to cement the patellar button and held it in place using a patellar clamp and removed all excess cement.  We then continued to irrigate and waited until all the cement had hardened. We then removed the trial tibial bearing and made a concerted effort to remove all bits of excess cement and any free bits of cement.  Following this the tibia was subluxed anteriorly.  The tray was completely cleaned and we then inserted the permanent tibial bearing insert #5, 12 mm in thickness. Following this we had good stability and the knee would fall into essentially full flexion and the patella tracked well.  The area was then again irrigated with pulsatile lavage.  The retinaculum was approximated using figure-of-eight sutures of #1 Vicryl.  The subcutaneous tissues were approximated with 2-0 Vicryl and skin edges were held in apposition with metal staples.  Xeroform, 4 x 4s, ABD type dressing followed by Kerlix and Ace wraps were applied.  Just superficial to the retinaculum a suction Hemovac was placed and then exited superolaterally.  Having completed the dressing a knee immobilizer was applied loosely.  Throughout the procedure the wound was intermittently irrigated with pulsatile lavage.  After having cemented the components the proximal pneumatic tourniquet  which had been elevated to 350 mm Hg was let down.  There were a few venous bleeders and these were cauterized.   At the completion of the procedure it was felt that estimated blood loss was about 300, perhaps 350 cc, none was replaced.  Following this Dr. Zoila Shutter was going to do a femoral block. Dictated by:   Reynolds Bowl, M.D. Attending Physician:  Maryanna Shape DD:  07/01/01 TD:  07/01/01 Job: [id number]

## 2010-12-13 NOTE — Discharge Summary (Signed)
[facility]  Patient:    Shirley Leblanc, Shirley Leblanc                        MRN: [id number] Adm. Date:  [id number] Disc. Date: [id number] Attending:  Colon Branch                           Discharge Summary  DIAGNOSES: 1. Paralumbar stenosis L4-5. 2. Left foraminal stenosis L5-S1.  PROCEDURE:  Bilateral 4-5 laminectomies and foraminotomies and left 5-1 semi-hemilaminectomy and foraminotomy with microdissection with microscope.  REASON FOR ADMISSION:  The patient is a 65 year old woman who, over the last six months, had progressive low back and left pain with neurogenic claudication after one block.  Steroid dosepak helped only minimally and temporarily.  Nonsteroidal anti-inflammatories do not help much at all.  MRI was done showing lateral stenosis at 4-5 due to spondylitic change and foraminal stenosis left 5-1 compressing the 5 root.  The patient was brought in for decompression.  HOSPITAL COURSE:   The patient was admitted on the day of surgery and underwent the procedure noted above without complications.  Postop, the patient was transferred to the recovery room and then the floor.  There, she is ambulating limited to the bathroom and back, having significant incisional pain but no leg pain.  On the first postop day, she started eating well, and we will have her ambulate a little more in the hall here, get a little more active, and then after lunch she will be discharged home.  CONDITION:  Stable.  DISCHARGE MEDICATIONS:  Same as preoperatively plus Percocet 1 to 2 p.o. q.4-6h. p.r.n., #51 given; and Flexeril 10 mg p.o. q.8h. p.r.n.  DIET:  As tolerated.  ACTIVITY:  No strenuous activity.  No bending, twisting.  May ambulate ad lib. No sitting longer than 20 to 30 minutes at a time.  No driving for a couple of weeks.  FOLLOWUP:  With me in my office. DD:  02/21/00 TD:  02/24/00 Job: [id number]

## 2010-12-13 NOTE — Op Note (Signed)
[place]. [facility]  Patient:    Shirley Leblanc, Shirley Leblanc                        MRN: [id number] Proc. Date: 02/20/00 Adm. Date:  [id number] Attending:  Colon Branch                           Operative Report  PREOPERATIVE DIAGNOSIS:  Lumbar stenosis L4-5 and foraminal stenosis left L5-S1.  POSTOPERATIVE DIAGNOSIS:  Lumbar stenosis L4-5 and foraminal stenosis left L5-S1.  PROCEDURE:  Bilateral L4-5 semi-hemilaminectomies and foraminotomies, left L5-S1 semi-hemilaminectomy and foraminotomy, microdissection with microscope.  SURGEON:  Clydene Fake, M.D.  ASSISTANT:  Bradd Canary., M.D.  ANESTHESIA:  General endotracheal anesthesia.  ESTIMATED BLOOD LOSS:  Minimal.  BLOOD GIVEN:  None.  DRAINS:  None.  COMPLICATIONS:  None.  REASON FOR PROCEDURE:  The patient is a 65 year old woman who has had neurogenic claudication of one block and has been worsening over six months, back and left leg pain in the L5 distribution.  MRI was done showing stenosis of L4-5 due to spinal changes and left foraminal narrowing compressing the L5 root.  The patient brought in for a decompression.  PROCEDURE IN DETAIL:  The patient was brought to the operating room and general anesthesia was induced.  The patient was placed in the prone position on a Wilson frame with all pressure points padded.  The patient was prepped and draped in the sterile fashion.  The site of the incision was injected with 10 cc of 1% lidocaine with epinephrine.  A needle was placed in the midline and a lower lumbar spine x-ray was obtain showing that this was at the L3-4 level.  An incision was then made centered below where the needle was.  The incision was taken down to the fascia and hemostasis was obtained with Bovie cauterization.  The fascia was incised and subperiosteal dissection was done on the left side over the L4 to S1 spinous processes of the lamina up to the facets.  A  marker was placed at the 4-5 interspace.  X-ray obtained showing this was 4-5.  Subperiosteal dissection was done on the right side at L4-5 out to the facet there.  Retractor was placed.  Microscope was brought into the field for microdissection.  A drill was used to perform hemilaminectomy on each side. This was extended with Kerrison punch.  Ligamentum flavum was then removed decompressing the thecal sac.  We then went out laterally removing the hypertrophied ligament and doing foraminotomies both over L4-5 roots.  We did this first on the left side extensively and repeated this on the right side. We explored the disk space.  There was no disk herniation seen.  After this, we had good decompression of the thecal sac and nerve roots bilaterally.  We also used the curets to pull on the ligament that was more cephalad from the bony exposure out to further decompressing the stenosis. Hemostasis was obtained with bipolar cauterization and Gelfoam and thrombin in the epidural space.  Attention was then taken to the L5-S1 interspace on the left where a semi-hemilaminectomy was performed with drill and Kerrison punches.  We did a foraminotomy over the S1 root and explored the disk space.  There was no disk herniation but there was a small bulge there and we left that alone.  We then more importantly started removing the hypertrophied ligament  and some osteophytes from the facet that were impinging the upper foramen that was compressing the fiber.  We opened this up.  We had complete decompression of the left L5 root after this.  Epidural hemostasis was obtained with bipolar cauterization and Gelfoam and thrombin.  All the Gelfoam was then irrigated out and irrigated with antibiotic solution and then the fascia was closed with 0 Vicryl interrupted suture.  The subcutaneous tissues were closed with 3-0 Vicryl interrupted sutures and the skin was closed with Dura-Bond skin glue. Dressing was  placed.  The patient was placed back in the supine position, awakened from anesthesia and transferred to the recovery room in stable condition. DD:  02/20/00 TD:  02/21/00 Job: [id number]

## 2010-12-13 NOTE — H&P (Signed)
[place]. [facility]  Patient:    Shirley Leblanc, Shirley Leblanc                        MRN: [id number] Adm. Date:  [id number] Attending:  Colon Branch                         History and Physical  CHIEF COMPLAINT:  Back and left pain.  HISTORY OF PRESENT ILLNESS:  This patient is a 65 year old woman who has undergone a two-level anterior cervical diskectomy and fusion in 1999, who over the last ix months has been having progressive low back and left leg pain with neurogenic claudication.  Has to stop after about one block.  The pain goes down in the L5  distribution.  No bowel or bladder complaints.  She had been on a steroid dosepak which helped just temporarily and has been on anti-inflammatories but they have not helped much.  An MRI of the lumbar spine was done showing degenerative changes and spondylosis throughout the lumbar spine, most significant at L4-5 with lateral stenosis due to a disk bulging and hypertrophic facets and ligamentous hypertrophy.  There is also a foraminal narrowing of the left L5-S1, compressing the L5 root there.  The patient is admitted for a decompression.  PAST MEDICAL HISTORY: 1. Hypertension. 2. Obesity.  PAST SURGICAL HISTORY: 1. Hysterectomy. 2. Left knee replacement in the [facility].  CURRENT MEDICATIONS: 1. Furosemide. 2. Estrace. 3. Accupril. 4. Zoloft. 5. Percocet.  ALLERGIES:  PENICILLIN which causes a rash.  CODEINE causes itching.  REVIEW OF SYSTEMS:  Otherwise negative.  FAMILY HISTORY:   Noncontributory.  PHYSICAL EXAMINATION:  GENERAL:  The patient is pleasant.  HEENT:  Unremarkable.  NECK:  Supple.  LUNGS:  Clear.  HEART:  A regular rhythm.  ABDOMEN:  Soft, nontender, obese.  EXTREMITIES:  Intact, no edema.  NEUROLOGIC/BACK:  Shows range of motion in the back flexion decreased, in extension which increases her back pain.  Left lateral bending also increases the left leg pain.  Gait is kind of  a shuffling-type of gait.  She has a straight leg raising on the left, negative on the right.  Motor strength is intact.  Sensation slightly  decreased in the left L5 distribution.  Deep tendon reflexes decreased at the knees and ankles bilaterally.  ASSESSMENT:   A patient with lumbar stenosis with L5 radicular component.  The patient is brought in for a decompression. DD:  02/20/00 TD:  02/20/00 Job: [id number]

## 2010-12-13 NOTE — Discharge Summary (Signed)
[place]  Patient:    Shirley Leblanc, Shirley Leblanc Visit Number: [id number] MRN: [id number]          Service Type: SUR Location: [id number] Attending Physician:  Maryanna Shape Dictated by:   Reynolds Bowl, M.D. Admit Date:  07/01/2001 Discharge Date: 07/07/2001                             Discharge Summary  ADMITTING DIAGNOSES: 1. Right knee osteoarthritis with varus deformity. 2. Hypertension. 3. Obesity.  OPERATIVE PROCEDURE:  On July 01, 2001, right total knee replacement with cementing of components utilizing tibial tray size 5, patellar component size 5, femoral component size 7, tibial bearing insert size 5, 12 mm thickness.  HISTORY OF PRESENT ILLNESS:  For history and physical, see that dictated on admission.  HOSPITAL COURSE:  On admission, the patient was taken to the operating room. She underwent right total knee arthroplasty as detailed in the operative note. All components were cemented.  The estimated blood loss was 300 cc of fluid. Pre- and postoperatively she was on prophylactic antibiotics, and she was begun on Coumadin postoperatively.  Her Foley was discontinued the following day, as was her suction Hemovac.  She was progressed with CPM and early ambulation.  By day 4, her INR was 2.5 with a goal of 2.0, her wound appeared fine, and the knee was flexing to approximately 70 degrees.  On December 10, she was flexing to approximately 75 degrees, her wound was felt to be healing well, and plans were made for her to be discharged for home physical therapy, continued Coumadin a total of four weeks per protocol, take Tylox for pain, partially weightbear to tolerance.  She was discharged December 11 after every other staple was removed.  Some of the laboratory data obtained for this admission:  EKG:  Normal sinus rhythm, normal EKG, and sent EKG of February 19, 2000, within normal limits. Initial hemoglobin 13.9, final hemoglobin  8.5.  On admission, her sedimentation rate was 37.  Her INR on discharge was 2.6.  Her admission electrolytes were normal.  Total protein 7.1, the albumin 3.7.  Routine chemistries were normal.  C-reactive protein was 2.8.  Urinalysis normal.  Urine culture:  Multiple species, probable contamination. Dictated by:   Reynolds Bowl, M.D. Attending Physician:  Maryanna Shape DD:  07/29/01 TD:  07/29/01 Job: [id number]

## 2011-03-10 ENCOUNTER — Telehealth: Payer: Self-pay | Admitting: Cardiology

## 2011-03-10 NOTE — Telephone Encounter (Signed)
The patient is scheduled for an appointment on 04/02/11 with Dr. Jens Som for an abnormal EKG. We have received no records on this patient per scheduling. I have left a message for Dr. Elmyra Ricks office at [phone] to send records.

## 2011-03-10 NOTE — Telephone Encounter (Signed)
Per pt call, pt has been having awful sweats when pt is just walking and pt breathing is horrible trying to catch her breath. Pt said she can hardly do anything, go to the grocery store or anything without sweating bad. Pt said her EKG was abnormal the past time she had it. Pt has an appt w/ Crenshaw Sept 5th but wanted to know if she can be seen sooner by either Crenshaw or if she can see another MD. Pt has not seen anyone is practice. Pt husband has since passed but was a previous pt of Dr. Jens Som. Please return pt call to advise/discuss.

## 2011-03-10 NOTE — Telephone Encounter (Signed)
I left a message for the patient to call. 

## 2011-03-11 NOTE — Telephone Encounter (Signed)
Records received from [facility] with pt, she reports not being able to walk from room to room in the house without getting SOB. She denies any edema. She states her medical md wanted her to see [place] sooner. She will see dr wall on Friday this week at 9:30am Shirley Leblanc

## 2011-03-13 ENCOUNTER — Encounter: Payer: Self-pay | Admitting: Cardiology

## 2011-03-13 ENCOUNTER — Encounter: Payer: Self-pay | Admitting: *Deleted

## 2011-03-14 ENCOUNTER — Ambulatory Visit (INDEPENDENT_AMBULATORY_CARE_PROVIDER_SITE_OTHER): Payer: Medicare Other | Admitting: Cardiology

## 2011-03-14 ENCOUNTER — Encounter: Payer: Self-pay | Admitting: Cardiology

## 2011-03-14 VITALS — BP 148/92 | HR 93 | Ht 65.0 in | Wt 276.0 lb

## 2011-03-14 DIAGNOSIS — R0600 Dyspnea, unspecified: Secondary | ICD-10-CM | POA: Insufficient documentation

## 2011-03-14 DIAGNOSIS — R0789 Other chest pain: Secondary | ICD-10-CM

## 2011-03-14 DIAGNOSIS — R0609 Other forms of dyspnea: Secondary | ICD-10-CM

## 2011-03-14 DIAGNOSIS — R0989 Other specified symptoms and signs involving the circulatory and respiratory systems: Secondary | ICD-10-CM

## 2011-03-14 NOTE — Progress Notes (Signed)
HPI Shirley Leblanc is a J89-year-old white female who comes in today referred by Dr. Selena Batten for evaluation of relatively new onset dyspnea on exertion and some mild chest tightness.  He was admitted to the hospital in April with some atypical chest pain. She ruled out for myocardial infarction. She had lower extremity Dopplers which were negative for DVT with a low positive d-dimer. She subsequently had a VQ scan which was low probably of chest x-ray showed no acute cardiopulmonary disease.  The symptoms she is having now are somewhat different than the band. She can only walk for 4 minutes on a treadmill. However, she is very overweight, does not exercise on regular basis, has had 2 total knee replacements.  Her risk factors also include hyperlipidemia currently on statin, hypertension, and age.  She was scheduled for stress test after her hospitalization but canceled it.  Her EKG today shows normal sinus rhythm with a left axis deviation with no acute changes. There's been no change since November 18, 2010.  Also of note she is allergic to contrast. He said the last time she had a contrast load she could not breathe. Past Medical History  Diagnosis Date  . DJD (degenerative joint disease)   . Bursitis   . Hyperlipemia   . Depression   . GERD (gastroesophageal reflux disease)   . Obesities, morbid   . Urinary incontinence     Past Surgical History  Procedure Date  . Neck surgery   . Cardiac catheterization   . Total knee arthroplasty     x2  . Vaginal hysterectomy     Family History  Problem Relation Age of Onset  . Lung disease    . Heart attack      History   Social History  . Marital Status: Widowed    Spouse Name: N/A    Number of Children: 4  . Years of Education: N/A   Occupational History  . RETIRED    Social History Main Topics  . Smoking status: Never Smoker   . Smokeless tobacco: Not on file  . Alcohol Use: No  . Drug Use: No  . Sexually Active: Not on file    Other Topics Concern  . Not on file   Social History Narrative  . No narrative on file    Allergies  Allergen Reactions  . Iodinated Diagnostic Agents   . Penicillins   . Sulfa Antibiotics     Current Outpatient Prescriptions  Medication Sig Dispense Refill  . aspirin 81 MG EC tablet Take 81 mg by mouth daily.        . Cholecalciferol (VITAMIN D3) 2000 UNITS TABS Take 2,000 Int'l Units by mouth daily.        . hydrochlorothiazide (,MICROZIDE/HYDRODIURIL,) 12.5 MG capsule Take 12.5 mg by mouth daily.        Marland Kitchen HYDROcodone-acetaminophen (VICODIN) 5-500 MG per tablet Take 1 tablet by mouth 2 (two) times daily.        Marland Kitchen levothyroxine (SYNTHROID, LEVOTHROID) 50 MCG tablet Take 50 mcg by mouth daily.        Marland Kitchen LORazepam (ATIVAN) 0.5 MG tablet Take 0.5 mg by mouth as needed.        . quinapril (ACCUPRIL) 40 MG tablet Take 40 mg by mouth at bedtime.        . rosuvastatin (CRESTOR) 5 MG tablet Take 5 mg by mouth at bedtime.        . sertraline (ZOLOFT) 100 MG tablet Take 100 mg  by mouth daily. Take 1 and 1/2 tablets daily         ROS No cough, fever, difficulty swallowing, reflux, abdominal discomfort, melena. All other review of systems are negative.   PE General Appearance: well developed, well nourished in no acute distress, morbidly obese HEENT: symmetrical face, PERRLA, dentures Neck: no JVD, thyromegaly, or adenopathy, trachea midline Chest: symmetric without deformity Cardiac: PMI not well appreciated, RRR, normal S1, S2, no gallop or murmur Lung: clear to ausculation and percussion Vascular: all pulses full without bruits  Abdominal: nondistended, nontender, good bowel sounds, no HSM, no bruits Extremities: no cyanosis, clubbing or edema, no sign of DVT, no varicosities  Skin: normal color, no rashes Neuro: alert and oriented x 3, non-focal Pysch: normal affect Filed Vitals:   03/14/11 0856  BP: 148/92  Pulse: 93  Height: 5\' 5"  (1.651 m)  Weight: 276 lb (125.193 kg)     EKG  Labs and Studies Reviewed.   Lab Results  Component Value Date   WBC 11.6* 11/17/2010   HGB 14.9 11/17/2010   HCT 43.4 11/17/2010   MCV 86.8 11/17/2010   PLT 266 11/17/2010      Chemistry      Component Value Date/Time   NA 138 11/17/2010 0945   K 3.3* 11/17/2010 0945   CL 100 11/17/2010 0945   CO2 28 11/17/2010 0945   BUN 14 11/17/2010 0945   CREATININE 0.77 11/17/2010 0945      Component Value Date/Time   CALCIUM 9.4 11/17/2010 0945       Lab Results  Component Value Date   CHOL  Value: 217        ATP III CLASSIFICATION:  <200     mg/dL   Desirable  454-098  mg/dL   Borderline High  >=119    mg/dL   High       * 1/47/8295   Lab Results  Component Value Date   HDL 44 11/18/2010   Lab Results  Component Value Date   LDLCALC  Value: 140        Total Cholesterol/HDL:CHD Risk Coronary Heart Disease Risk Table                     Men   Women  1/2 Average Risk   3.4   3.3  Average Risk       5.0   4.4  2 X Average Risk   9.6   7.1  3 X Average Risk  23.4   11.0        Use the calculated Patient Ratio above and the CHD Risk Table to determine the patient's CHD Risk.        ATP III CLASSIFICATION (LDL):  <100     mg/dL   Optimal  621-308  mg/dL   Near or Above                    Optimal  130-159  mg/dL   Borderline  657-846  mg/dL   High  >962     mg/dL   Very High* 9/52/8413   Lab Results  Component Value Date   TRIG 164* 11/18/2010   Lab Results  Component Value Date   CHOLHDL 4.9 11/18/2010   No results found for this basename: HGBA1C   No results found for this basename: ALT, AST, GGT, ALKPHOS, BILITOT   Lab Results  Component Value Date   TSH 3.066 11/17/2010

## 2011-03-14 NOTE — Patient Instructions (Signed)
Your physician has requested that you have a lexiscan myoview. For further information please visit [email or web address]. Please follow instruction sheet, as given.   

## 2011-03-17 ENCOUNTER — Encounter: Payer: Self-pay | Admitting: Cardiology

## 2011-03-27 ENCOUNTER — Ambulatory Visit (HOSPITAL_COMMUNITY): Payer: Medicare Other | Attending: Cardiology | Admitting: Radiology

## 2011-03-27 DIAGNOSIS — R079 Chest pain, unspecified: Secondary | ICD-10-CM | POA: Insufficient documentation

## 2011-03-27 DIAGNOSIS — R0609 Other forms of dyspnea: Secondary | ICD-10-CM | POA: Insufficient documentation

## 2011-03-27 DIAGNOSIS — I4949 Other premature depolarization: Secondary | ICD-10-CM

## 2011-03-27 DIAGNOSIS — R0989 Other specified symptoms and signs involving the circulatory and respiratory systems: Secondary | ICD-10-CM

## 2011-03-27 DIAGNOSIS — R0602 Shortness of breath: Secondary | ICD-10-CM

## 2011-03-27 DIAGNOSIS — R0789 Other chest pain: Secondary | ICD-10-CM

## 2011-03-27 MED ORDER — TECHNETIUM TC 99M TETROFOSMIN IV KIT
33.0000 | PACK | Freq: Once | INTRAVENOUS | Status: AC | PRN
  Administered 2011-03-27: 33 via INTRAVENOUS

## 2011-03-27 MED ORDER — REGADENOSON 0.4 MG/5ML IV SOLN
0.4000 mg | Freq: Once | INTRAVENOUS | Status: AC
  Administered 2011-03-27: 0.4 mg via INTRAVENOUS

## 2011-03-27 NOTE — Progress Notes (Signed)
[facility] SITE 3 NUCLEAR MED [address] [place] [address] [phone]  Cardiology Nuclear Med Study  LULAR LETSON is a 65 y.o. female [id number] 1945-12-15   Nuclear Med Background Indication for Stress Test:  Evaluation for Ischemia, and Abnormal EKG History: 02/07 Heart Catheterization and 01/07 Myocardial Perfusion Study: inferoseptlateral ischemia Cardiac Risk Factors: Family History - CAD, Hypertension, Lipids and Obesity  Symptoms:  Chest Tightness, DOE, Nausea, Palpitations and SOB   Nuclear Pre-Procedure Caffeine/Decaff Intake:  None NPO After: 10:00pm   Lungs: clear IV 0.9% NS with Angio Cath:  20g  IV Site: R Antecubital  IV Started by:  Stanton Kidney, EMT-P  Chest Size (in):  48 Cup Size: D  Height: 5\' 5"  (1.651 m)  Weight:  277 lb (125.646 kg)  BMI:  Body mass index is 46.10 kg/(m^2). Tech Comments:  NA    Nuclear Med Study 1 or 2 day study: 2 day  Stress Test Type:  Eugenie Birks  Reading MD: Willa Rough MD  Order Authorizing Provider:  T.Wall  Resting Radionuclide: Technetium 44m Tetrofosmin  Resting Radionuclide Dose: 33 mCi   Stress Radionuclide:  Technetium 48m Tetrofosmin  Stress Radionuclide Dose: 33 mCi           Stress Protocol Rest HR: 86 Stress HR: 103  Rest BP: 115/86 Stress BP: 129/97  Exercise Time (min): n/a METS: n/a   Predicted Max HR: 155 bpm % Max HR: 66.45 bpm Rate Pressure Product: 21308   Dose of Adenosine (mg):  n/a Dose of Lexiscan: 0.4 mg  Dose of Atropine (mg): n/a Dose of Dobutamine: n/a mcg/kg/min (at max HR)  Stress Test Technologist: Milana Na, EMT-P  Nuclear Technologist:  Domenic Polite, CNMT     Rest Procedure:  Myocardial perfusion imaging was performed at rest 45 minutes following the intravenous administration of Technetium 22m Tetrofosmin. Rest ECG: NSR  Stress Procedure:  The patient received IV Lexiscan 0.4 mg over 15-seconds.  Technetium 17m Tetrofosmin injected at 30-seconds.   There were no significant changes and rare pvcs  with Lexiscan.  Quantitative spect images were obtained after a 45 minute delay. Stress ECG: No significant change from baseline ECG  QPS Raw Data Images:  Normal; no motion artifact; normal heart/lung ratio. Stress Images:  slight decreased activity at the apical cap. Rest Images:  same as stress Subtraction (SDS):  No evidence of ischemia. Transient Ischemic Dilatation (Normal <1.22):  1.05 Lung/Heart Ratio (Normal <0.45):  .41  Quantitative Gated Spect Images QGS EDV:  86 ml QGS ESV:  30 ml QGS cine images:  Normal Wall Motion QGS EF: 65%  Impression Exercise Capacity:  Lexiscan with no exercise. BP Response:  Normal blood pressure response. Clinical Symptoms:  SOB ECG Impression:  No significant ST segment change suggestive of ischemia. Comparison with Prior Nuclear Study: No images to compare  Overall Impression:  Negative stress nuclear study. There is mild apical thinning. There is no scar or ischemia.  Willa Rough

## 2011-04-01 ENCOUNTER — Ambulatory Visit (HOSPITAL_COMMUNITY): Payer: Medicare Other | Attending: Cardiology | Admitting: Radiology

## 2011-04-01 DIAGNOSIS — R079 Chest pain, unspecified: Secondary | ICD-10-CM | POA: Insufficient documentation

## 2011-04-01 DIAGNOSIS — R0989 Other specified symptoms and signs involving the circulatory and respiratory systems: Secondary | ICD-10-CM | POA: Insufficient documentation

## 2011-04-01 DIAGNOSIS — R0609 Other forms of dyspnea: Secondary | ICD-10-CM | POA: Insufficient documentation

## 2011-04-01 MED ORDER — TECHNETIUM TC 99M TETROFOSMIN IV KIT
33.0000 | PACK | Freq: Once | INTRAVENOUS | Status: AC | PRN
  Administered 2011-04-01: 33 via INTRAVENOUS

## 2011-04-02 ENCOUNTER — Ambulatory Visit: Payer: Medicare Other | Admitting: Cardiology

## 2011-04-04 ENCOUNTER — Telehealth: Payer: Self-pay | Admitting: *Deleted

## 2011-04-04 NOTE — Telephone Encounter (Signed)
Detailed message left on personal voicemail--normal stress test Mylo Red RN

## 2011-04-22 LAB — DIFFERENTIAL
Eosinophils Relative: 1
Lymphocytes Relative: 21
Lymphs Abs: 2.9
Monocytes Absolute: 0.8

## 2011-04-22 LAB — COMPREHENSIVE METABOLIC PANEL
ALT: 19
Albumin: 3.7
BUN: 13
CO2: 29
Calcium: 9.4
Chloride: 102
Potassium: 3.4 — ABNORMAL LOW
Total Protein: 7

## 2011-04-22 LAB — CBC
HCT: 49.4 — ABNORMAL HIGH
Hemoglobin: 16.8 — ABNORMAL HIGH
RBC: 5.67 — ABNORMAL HIGH
WBC: 13.8 — ABNORMAL HIGH

## 2011-04-22 LAB — URINALYSIS, ROUTINE W REFLEX MICROSCOPIC
Protein, ur: NEGATIVE
Urobilinogen, UA: 1

## 2011-04-22 LAB — LIPASE, BLOOD: Lipase: 27

## 2011-04-22 LAB — URINE MICROSCOPIC-ADD ON

## 2011-04-25 LAB — COMPREHENSIVE METABOLIC PANEL
ALT: 26
AST: 29
Albumin: 3.4 — ABNORMAL LOW
Alkaline Phosphatase: 76
BUN: 22
CO2: 32
Calcium: 9.1
Chloride: 94 — ABNORMAL LOW
Creatinine, Ser: 1.61 — ABNORMAL HIGH
GFR calc Af Amer: >60
Glucose, Bld: 120 — ABNORMAL HIGH
Potassium: 3.1 — ABNORMAL LOW
Sodium: 137
Total Bilirubin: 0.9
Total Protein: 6.6

## 2011-04-25 LAB — CLOSTRIDIUM DIFFICILE EIA: C difficile Toxins A+B, EIA: NEGATIVE

## 2011-04-25 LAB — DIFFERENTIAL
Basophils Absolute: 0
Basophils Relative: 0
Neutro Abs: 8.4 — ABNORMAL HIGH
Neutrophils Relative %: 69

## 2011-04-25 LAB — URINE MICROSCOPIC-ADD ON

## 2011-04-25 LAB — CBC
HCT: 39
HCT: 42.2
Hemoglobin: 13.3
Hemoglobin: 14.7
MCHC: 34.1
MCHC: 34.2
MCV: 85.4
MCV: 88.6
Platelets: 197
RBC: 4.83
RDW: 12.2
RDW: 13.3
WBC: 12.1 — ABNORMAL HIGH

## 2011-04-25 LAB — CARDIAC PANEL(CRET KIN+CKTOT+MB+TROPI)
CK, MB: 2
CK, MB: 2.2
Relative Index: 1.5
Total CK: 117
Total CK: 142
Troponin I: 0.04

## 2011-04-25 LAB — CULTURE, BLOOD (ROUTINE X 2): Culture: NO GROWTH

## 2011-04-25 LAB — URINALYSIS, ROUTINE W REFLEX MICROSCOPIC
Glucose, UA: NEGATIVE
Ketones, ur: NEGATIVE
Nitrite: NEGATIVE
Protein, ur: 30 — AB

## 2011-04-25 LAB — BASIC METABOLIC PANEL
BUN: 11
CO2: 30
GFR calc non Af Amer: >60
Glucose, Bld: 126 — ABNORMAL HIGH
Potassium: 3.4 — ABNORMAL LOW

## 2011-04-25 LAB — B-NATRIURETIC PEPTIDE (CONVERTED LAB): Pro B Natriuretic peptide (BNP): <30

## 2011-04-25 LAB — PHOSPHORUS: Phosphorus: 3.9

## 2011-04-25 LAB — OVA AND PARASITE EXAMINATION

## 2011-04-25 LAB — URINE CULTURE: Colony Count: 35000

## 2011-04-25 LAB — TSH: TSH: 2.237

## 2011-04-25 LAB — FECAL LACTOFERRIN, QUANT

## 2011-04-25 LAB — HOMOCYSTEINE: Homocysteine: 17.3 — ABNORMAL HIGH

## 2011-04-25 LAB — MAGNESIUM: Magnesium: 2.2

## 2011-10-07 DIAGNOSIS — Z79899 Other long term (current) drug therapy: Secondary | ICD-10-CM | POA: Diagnosis not present

## 2011-10-07 DIAGNOSIS — E039 Hypothyroidism, unspecified: Secondary | ICD-10-CM | POA: Diagnosis not present

## 2011-10-07 DIAGNOSIS — I10 Essential (primary) hypertension: Secondary | ICD-10-CM | POA: Diagnosis not present

## 2011-10-07 DIAGNOSIS — E782 Mixed hyperlipidemia: Secondary | ICD-10-CM | POA: Diagnosis not present

## 2011-12-07 DIAGNOSIS — G4733 Obstructive sleep apnea (adult) (pediatric): Secondary | ICD-10-CM | POA: Diagnosis not present

## 2011-12-16 DIAGNOSIS — F329 Major depressive disorder, single episode, unspecified: Secondary | ICD-10-CM | POA: Diagnosis not present

## 2011-12-16 DIAGNOSIS — E669 Obesity, unspecified: Secondary | ICD-10-CM | POA: Diagnosis not present

## 2011-12-16 DIAGNOSIS — R7309 Other abnormal glucose: Secondary | ICD-10-CM | POA: Diagnosis not present

## 2012-03-31 DIAGNOSIS — M25569 Pain in unspecified knee: Secondary | ICD-10-CM | POA: Diagnosis not present

## 2012-03-31 DIAGNOSIS — R7309 Other abnormal glucose: Secondary | ICD-10-CM | POA: Diagnosis not present

## 2012-03-31 DIAGNOSIS — E039 Hypothyroidism, unspecified: Secondary | ICD-10-CM | POA: Diagnosis not present

## 2012-03-31 DIAGNOSIS — M549 Dorsalgia, unspecified: Secondary | ICD-10-CM | POA: Diagnosis not present

## 2012-03-31 DIAGNOSIS — I10 Essential (primary) hypertension: Secondary | ICD-10-CM | POA: Diagnosis not present

## 2012-03-31 DIAGNOSIS — M545 Low back pain, unspecified: Secondary | ICD-10-CM | POA: Diagnosis not present

## 2012-04-01 DIAGNOSIS — R7309 Other abnormal glucose: Secondary | ICD-10-CM | POA: Diagnosis not present

## 2012-04-07 DIAGNOSIS — G894 Chronic pain syndrome: Secondary | ICD-10-CM | POA: Diagnosis not present

## 2012-04-07 DIAGNOSIS — M5380 Other specified dorsopathies, site unspecified: Secondary | ICD-10-CM | POA: Diagnosis not present

## 2012-04-07 DIAGNOSIS — Z79899 Other long term (current) drug therapy: Secondary | ICD-10-CM | POA: Diagnosis not present

## 2012-04-07 DIAGNOSIS — M79609 Pain in unspecified limb: Secondary | ICD-10-CM | POA: Diagnosis not present

## 2012-04-07 DIAGNOSIS — M5137 Other intervertebral disc degeneration, lumbosacral region: Secondary | ICD-10-CM | POA: Diagnosis not present

## 2012-04-20 DIAGNOSIS — IMO0002 Reserved for concepts with insufficient information to code with codable children: Secondary | ICD-10-CM | POA: Diagnosis not present

## 2012-04-22 DIAGNOSIS — M5137 Other intervertebral disc degeneration, lumbosacral region: Secondary | ICD-10-CM | POA: Diagnosis not present

## 2012-04-22 DIAGNOSIS — M5126 Other intervertebral disc displacement, lumbar region: Secondary | ICD-10-CM | POA: Diagnosis not present

## 2012-04-29 DIAGNOSIS — M79609 Pain in unspecified limb: Secondary | ICD-10-CM | POA: Diagnosis not present

## 2012-04-29 DIAGNOSIS — Z23 Encounter for immunization: Secondary | ICD-10-CM | POA: Diagnosis not present

## 2012-04-29 DIAGNOSIS — IMO0002 Reserved for concepts with insufficient information to code with codable children: Secondary | ICD-10-CM | POA: Diagnosis not present

## 2012-04-30 ENCOUNTER — Other Ambulatory Visit: Payer: Self-pay | Admitting: Internal Medicine

## 2012-04-30 DIAGNOSIS — Z1231 Encounter for screening mammogram for malignant neoplasm of breast: Secondary | ICD-10-CM

## 2012-05-05 ENCOUNTER — Ambulatory Visit: Payer: Medicare Other

## 2012-05-14 DIAGNOSIS — M5137 Other intervertebral disc degeneration, lumbosacral region: Secondary | ICD-10-CM | POA: Diagnosis not present

## 2012-05-14 DIAGNOSIS — M76899 Other specified enthesopathies of unspecified lower limb, excluding foot: Secondary | ICD-10-CM | POA: Diagnosis not present

## 2012-05-14 DIAGNOSIS — M5380 Other specified dorsopathies, site unspecified: Secondary | ICD-10-CM | POA: Diagnosis not present

## 2012-05-14 DIAGNOSIS — G894 Chronic pain syndrome: Secondary | ICD-10-CM | POA: Diagnosis not present

## 2012-05-18 DIAGNOSIS — IMO0002 Reserved for concepts with insufficient information to code with codable children: Secondary | ICD-10-CM | POA: Diagnosis not present

## 2012-05-19 DIAGNOSIS — M5137 Other intervertebral disc degeneration, lumbosacral region: Secondary | ICD-10-CM | POA: Diagnosis not present

## 2012-05-20 DIAGNOSIS — M5137 Other intervertebral disc degeneration, lumbosacral region: Secondary | ICD-10-CM | POA: Diagnosis not present

## 2012-05-24 DIAGNOSIS — M5137 Other intervertebral disc degeneration, lumbosacral region: Secondary | ICD-10-CM | POA: Diagnosis not present

## 2012-05-26 DIAGNOSIS — M5137 Other intervertebral disc degeneration, lumbosacral region: Secondary | ICD-10-CM | POA: Diagnosis not present

## 2012-05-31 DIAGNOSIS — M5137 Other intervertebral disc degeneration, lumbosacral region: Secondary | ICD-10-CM | POA: Diagnosis not present

## 2012-06-02 DIAGNOSIS — M5137 Other intervertebral disc degeneration, lumbosacral region: Secondary | ICD-10-CM | POA: Diagnosis not present

## 2012-06-08 DIAGNOSIS — M5137 Other intervertebral disc degeneration, lumbosacral region: Secondary | ICD-10-CM | POA: Diagnosis not present

## 2012-06-11 DIAGNOSIS — M5137 Other intervertebral disc degeneration, lumbosacral region: Secondary | ICD-10-CM | POA: Diagnosis not present

## 2012-06-14 DIAGNOSIS — M5137 Other intervertebral disc degeneration, lumbosacral region: Secondary | ICD-10-CM | POA: Diagnosis not present

## 2012-06-17 DIAGNOSIS — G894 Chronic pain syndrome: Secondary | ICD-10-CM | POA: Diagnosis not present

## 2012-06-17 DIAGNOSIS — IMO0002 Reserved for concepts with insufficient information to code with codable children: Secondary | ICD-10-CM | POA: Diagnosis not present

## 2012-06-17 DIAGNOSIS — M1710 Unilateral primary osteoarthritis, unspecified knee: Secondary | ICD-10-CM | POA: Diagnosis not present

## 2012-06-17 DIAGNOSIS — M5137 Other intervertebral disc degeneration, lumbosacral region: Secondary | ICD-10-CM | POA: Diagnosis not present

## 2012-07-05 DIAGNOSIS — R7309 Other abnormal glucose: Secondary | ICD-10-CM | POA: Diagnosis not present

## 2012-07-05 DIAGNOSIS — E785 Hyperlipidemia, unspecified: Secondary | ICD-10-CM | POA: Diagnosis not present

## 2012-07-05 DIAGNOSIS — J209 Acute bronchitis, unspecified: Secondary | ICD-10-CM | POA: Diagnosis not present

## 2012-08-05 DIAGNOSIS — IMO0002 Reserved for concepts with insufficient information to code with codable children: Secondary | ICD-10-CM | POA: Diagnosis not present

## 2012-08-05 DIAGNOSIS — M47817 Spondylosis without myelopathy or radiculopathy, lumbosacral region: Secondary | ICD-10-CM | POA: Diagnosis not present

## 2012-08-05 DIAGNOSIS — M1710 Unilateral primary osteoarthritis, unspecified knee: Secondary | ICD-10-CM | POA: Diagnosis not present

## 2012-08-05 DIAGNOSIS — G894 Chronic pain syndrome: Secondary | ICD-10-CM | POA: Diagnosis not present

## 2012-08-05 DIAGNOSIS — M5137 Other intervertebral disc degeneration, lumbosacral region: Secondary | ICD-10-CM | POA: Diagnosis not present

## 2012-09-01 DIAGNOSIS — M25559 Pain in unspecified hip: Secondary | ICD-10-CM | POA: Diagnosis not present

## 2012-09-01 DIAGNOSIS — M25569 Pain in unspecified knee: Secondary | ICD-10-CM | POA: Diagnosis not present

## 2012-09-01 DIAGNOSIS — M545 Low back pain, unspecified: Secondary | ICD-10-CM | POA: Diagnosis not present

## 2012-09-14 DIAGNOSIS — M5137 Other intervertebral disc degeneration, lumbosacral region: Secondary | ICD-10-CM | POA: Diagnosis not present

## 2012-10-04 DIAGNOSIS — J411 Mucopurulent chronic bronchitis: Secondary | ICD-10-CM | POA: Diagnosis not present

## 2012-10-29 ENCOUNTER — Ambulatory Visit
Admission: RE | Admit: 2012-10-29 | Discharge: 2012-10-29 | Disposition: A | Discharge status: Home / Self Care | Payer: Medicare Other | Source: Ambulatory Visit | Attending: Internal Medicine | Admitting: Internal Medicine

## 2012-10-29 DIAGNOSIS — Z1231 Encounter for screening mammogram for malignant neoplasm of breast: Secondary | ICD-10-CM | POA: Diagnosis not present

## 2012-11-01 ENCOUNTER — Other Ambulatory Visit: Payer: Self-pay | Admitting: Internal Medicine

## 2012-11-01 DIAGNOSIS — R928 Other abnormal and inconclusive findings on diagnostic imaging of breast: Secondary | ICD-10-CM

## 2012-11-09 DIAGNOSIS — F411 Generalized anxiety disorder: Secondary | ICD-10-CM | POA: Diagnosis not present

## 2012-11-09 DIAGNOSIS — M5137 Other intervertebral disc degeneration, lumbosacral region: Secondary | ICD-10-CM | POA: Diagnosis not present

## 2012-11-09 DIAGNOSIS — I10 Essential (primary) hypertension: Secondary | ICD-10-CM | POA: Diagnosis not present

## 2012-11-09 DIAGNOSIS — E039 Hypothyroidism, unspecified: Secondary | ICD-10-CM | POA: Diagnosis not present

## 2012-11-09 DIAGNOSIS — R7309 Other abnormal glucose: Secondary | ICD-10-CM | POA: Diagnosis not present

## 2012-11-10 ENCOUNTER — Other Ambulatory Visit: Payer: Self-pay | Admitting: Internal Medicine

## 2012-11-10 DIAGNOSIS — I8390 Asymptomatic varicose veins of unspecified lower extremity: Secondary | ICD-10-CM

## 2012-11-12 ENCOUNTER — Ambulatory Visit
Admission: RE | Admit: 2012-11-12 | Discharge: 2012-11-12 | Disposition: A | Discharge status: Home / Self Care | Payer: Medicare Other | Source: Ambulatory Visit | Attending: Internal Medicine | Admitting: Internal Medicine

## 2012-11-12 DIAGNOSIS — R928 Other abnormal and inconclusive findings on diagnostic imaging of breast: Secondary | ICD-10-CM | POA: Diagnosis not present

## 2012-11-21 ENCOUNTER — Emergency Department: Payer: Self-pay | Admitting: Emergency Medicine

## 2012-11-21 DIAGNOSIS — M549 Dorsalgia, unspecified: Secondary | ICD-10-CM | POA: Diagnosis not present

## 2012-11-21 DIAGNOSIS — S32009A Unspecified fracture of unspecified lumbar vertebra, initial encounter for closed fracture: Secondary | ICD-10-CM | POA: Diagnosis not present

## 2012-11-21 DIAGNOSIS — Z043 Encounter for examination and observation following other accident: Secondary | ICD-10-CM | POA: Diagnosis not present

## 2012-11-21 DIAGNOSIS — S93409A Sprain of unspecified ligament of unspecified ankle, initial encounter: Secondary | ICD-10-CM | POA: Diagnosis not present

## 2012-11-21 DIAGNOSIS — M47817 Spondylosis without myelopathy or radiculopathy, lumbosacral region: Secondary | ICD-10-CM | POA: Diagnosis not present

## 2012-11-21 DIAGNOSIS — M25559 Pain in unspecified hip: Secondary | ICD-10-CM | POA: Diagnosis not present

## 2012-11-25 ENCOUNTER — Other Ambulatory Visit: Payer: Medicare Other

## 2013-01-04 DIAGNOSIS — M5137 Other intervertebral disc degeneration, lumbosacral region: Secondary | ICD-10-CM | POA: Diagnosis not present

## 2013-01-11 DIAGNOSIS — Z7982 Long term (current) use of aspirin: Secondary | ICD-10-CM | POA: Diagnosis not present

## 2013-01-11 DIAGNOSIS — S51809A Unspecified open wound of unspecified forearm, initial encounter: Secondary | ICD-10-CM | POA: Diagnosis not present

## 2013-01-12 DIAGNOSIS — M25569 Pain in unspecified knee: Secondary | ICD-10-CM | POA: Diagnosis not present

## 2013-01-31 ENCOUNTER — Other Ambulatory Visit: Payer: Self-pay | Admitting: Internal Medicine

## 2013-01-31 DIAGNOSIS — R921 Mammographic calcification found on diagnostic imaging of breast: Secondary | ICD-10-CM

## 2013-02-01 DIAGNOSIS — M5137 Other intervertebral disc degeneration, lumbosacral region: Secondary | ICD-10-CM | POA: Diagnosis not present

## 2013-02-11 ENCOUNTER — Emergency Department: Payer: Self-pay | Admitting: Emergency Medicine

## 2013-02-11 DIAGNOSIS — M7989 Other specified soft tissue disorders: Secondary | ICD-10-CM | POA: Diagnosis not present

## 2013-02-11 DIAGNOSIS — S42293A Other displaced fracture of upper end of unspecified humerus, initial encounter for closed fracture: Secondary | ICD-10-CM | POA: Diagnosis not present

## 2013-02-11 DIAGNOSIS — IMO0002 Reserved for concepts with insufficient information to code with codable children: Secondary | ICD-10-CM | POA: Diagnosis not present

## 2013-02-11 DIAGNOSIS — M25569 Pain in unspecified knee: Secondary | ICD-10-CM | POA: Diagnosis not present

## 2013-02-11 DIAGNOSIS — S43006A Unspecified dislocation of unspecified shoulder joint, initial encounter: Secondary | ICD-10-CM | POA: Diagnosis not present

## 2013-02-11 DIAGNOSIS — T148XXA Other injury of unspecified body region, initial encounter: Secondary | ICD-10-CM | POA: Diagnosis not present

## 2013-02-11 DIAGNOSIS — S42213A Unspecified displaced fracture of surgical neck of unspecified humerus, initial encounter for closed fracture: Secondary | ICD-10-CM | POA: Diagnosis not present

## 2013-02-11 DIAGNOSIS — M549 Dorsalgia, unspecified: Secondary | ICD-10-CM | POA: Diagnosis not present

## 2013-02-14 DIAGNOSIS — M25519 Pain in unspecified shoulder: Secondary | ICD-10-CM | POA: Diagnosis not present

## 2013-02-14 DIAGNOSIS — S42209A Unspecified fracture of upper end of unspecified humerus, initial encounter for closed fracture: Secondary | ICD-10-CM | POA: Diagnosis not present

## 2013-02-16 ENCOUNTER — Emergency Department (HOSPITAL_COMMUNITY)
Admission: EM | Admit: 2013-02-16 | Discharge: 2013-02-16 | Disposition: A | Discharge status: Home / Self Care | Payer: Medicare Other | Attending: Emergency Medicine | Admitting: Emergency Medicine

## 2013-02-16 ENCOUNTER — Emergency Department (HOSPITAL_COMMUNITY): Payer: Medicare Other

## 2013-02-16 ENCOUNTER — Encounter (HOSPITAL_COMMUNITY): Payer: Self-pay | Admitting: *Deleted

## 2013-02-16 ENCOUNTER — Other Ambulatory Visit: Payer: Self-pay | Admitting: Orthopedic Surgery

## 2013-02-16 DIAGNOSIS — Z79899 Other long term (current) drug therapy: Secondary | ICD-10-CM | POA: Diagnosis not present

## 2013-02-16 DIAGNOSIS — S42309D Unspecified fracture of shaft of humerus, unspecified arm, subsequent encounter for fracture with routine healing: Secondary | ICD-10-CM | POA: Diagnosis not present

## 2013-02-16 DIAGNOSIS — S8990XA Unspecified injury of unspecified lower leg, initial encounter: Secondary | ICD-10-CM | POA: Diagnosis not present

## 2013-02-16 DIAGNOSIS — Z8719 Personal history of other diseases of the digestive system: Secondary | ICD-10-CM | POA: Diagnosis not present

## 2013-02-16 DIAGNOSIS — Z8739 Personal history of other diseases of the musculoskeletal system and connective tissue: Secondary | ICD-10-CM | POA: Insufficient documentation

## 2013-02-16 DIAGNOSIS — R531 Weakness: Secondary | ICD-10-CM

## 2013-02-16 DIAGNOSIS — Y92009 Unspecified place in unspecified non-institutional (private) residence as the place of occurrence of the external cause: Secondary | ICD-10-CM | POA: Insufficient documentation

## 2013-02-16 DIAGNOSIS — R6889 Other general symptoms and signs: Secondary | ICD-10-CM | POA: Diagnosis not present

## 2013-02-16 DIAGNOSIS — F3289 Other specified depressive episodes: Secondary | ICD-10-CM | POA: Insufficient documentation

## 2013-02-16 DIAGNOSIS — W19XXXA Unspecified fall, initial encounter: Secondary | ICD-10-CM | POA: Insufficient documentation

## 2013-02-16 DIAGNOSIS — E860 Dehydration: Secondary | ICD-10-CM | POA: Diagnosis not present

## 2013-02-16 DIAGNOSIS — M25569 Pain in unspecified knee: Secondary | ICD-10-CM | POA: Diagnosis not present

## 2013-02-16 DIAGNOSIS — M25512 Pain in left shoulder: Secondary | ICD-10-CM

## 2013-02-16 DIAGNOSIS — E785 Hyperlipidemia, unspecified: Secondary | ICD-10-CM | POA: Diagnosis not present

## 2013-02-16 DIAGNOSIS — Z87448 Personal history of other diseases of urinary system: Secondary | ICD-10-CM | POA: Diagnosis not present

## 2013-02-16 DIAGNOSIS — S42302K Unspecified fracture of shaft of humerus, left arm, subsequent encounter for fracture with nonunion: Secondary | ICD-10-CM

## 2013-02-16 DIAGNOSIS — S79919A Unspecified injury of unspecified hip, initial encounter: Secondary | ICD-10-CM | POA: Diagnosis not present

## 2013-02-16 DIAGNOSIS — M25519 Pain in unspecified shoulder: Secondary | ICD-10-CM | POA: Diagnosis not present

## 2013-02-16 DIAGNOSIS — M25559 Pain in unspecified hip: Secondary | ICD-10-CM | POA: Diagnosis not present

## 2013-02-16 DIAGNOSIS — F329 Major depressive disorder, single episode, unspecified: Secondary | ICD-10-CM | POA: Insufficient documentation

## 2013-02-16 DIAGNOSIS — G8921 Chronic pain due to trauma: Secondary | ICD-10-CM | POA: Insufficient documentation

## 2013-02-16 DIAGNOSIS — T148XXA Other injury of unspecified body region, initial encounter: Secondary | ICD-10-CM | POA: Diagnosis not present

## 2013-02-16 DIAGNOSIS — Z7982 Long term (current) use of aspirin: Secondary | ICD-10-CM | POA: Insufficient documentation

## 2013-02-16 DIAGNOSIS — S42213A Unspecified displaced fracture of surgical neck of unspecified humerus, initial encounter for closed fracture: Secondary | ICD-10-CM | POA: Diagnosis not present

## 2013-02-16 DIAGNOSIS — Y939 Activity, unspecified: Secondary | ICD-10-CM | POA: Insufficient documentation

## 2013-02-16 DIAGNOSIS — S42209A Unspecified fracture of upper end of unspecified humerus, initial encounter for closed fracture: Secondary | ICD-10-CM | POA: Diagnosis not present

## 2013-02-16 LAB — COMPREHENSIVE METABOLIC PANEL
AST: 17 U/L (ref 0–37)
Albumin: 2.9 g/dL — ABNORMAL LOW (ref 3.5–5.2)
BUN: 34 mg/dL — ABNORMAL HIGH (ref 6–23)
Calcium: 9.8 mg/dL (ref 8.4–10.5)
Chloride: 95 mEq/L — ABNORMAL LOW (ref 96–112)
Creatinine, Ser: 1.3 mg/dL — ABNORMAL HIGH (ref 0.50–1.10)
Total Protein: 6.3 g/dL (ref 6.0–8.3)

## 2013-02-16 LAB — CBC WITH DIFFERENTIAL/PLATELET
Basophils Absolute: 0 10*3/uL (ref 0.0–0.1)
Basophils Relative: 0 % (ref 0–1)
HCT: 35.3 % — ABNORMAL LOW (ref 36.0–46.0)
Hemoglobin: 11.9 g/dL — ABNORMAL LOW (ref 12.0–15.0)
MCH: 30.3 pg (ref 26.0–34.0)
MCV: 89.8 fL (ref 78.0–100.0)
Monocytes Absolute: 1.3 10*3/uL — ABNORMAL HIGH (ref 0.1–1.0)
Platelets: 253 10*3/uL (ref 150–400)
RBC: 3.93 MIL/uL (ref 3.87–5.11)
WBC: 17.3 10*3/uL — ABNORMAL HIGH (ref 4.0–10.5)

## 2013-02-16 MED ORDER — SODIUM CHLORIDE 0.9 % IV SOLN
Freq: Once | INTRAVENOUS | Status: AC
  Administered 2013-02-16: 21:00:00 via INTRAVENOUS

## 2013-02-16 MED ORDER — FENTANYL CITRATE 0.05 MG/ML IJ SOLN
50.0000 ug | Freq: Once | INTRAMUSCULAR | Status: AC
  Administered 2013-02-16: 50 ug via INTRAVENOUS
  Filled 2013-02-16: qty 2

## 2013-02-16 NOTE — ED Provider Notes (Signed)
History    CSN: [id number] Arrival date & time 02/16/13  1702  First MD Initiated Contact with Patient 02/16/13 1705     Chief Complaint  Patient presents with  . Fall   (Consider location/radiation/quality/duration/timing/severity/associated sxs/prior Treatment) HPI  Patient presents after a fall. She had a series of falls over the past week.  After the initial fall she had a diagnosis of fracture of humerus. In the interval which she continues to take clonazepam and Flexeril and Norco. The falls seem to occur after periods of disequilibrium, with no near-syncope/syncope, chest pain or other notable prodromal period. No falls have resulted in loss of consciousness, or any post fall chest pain, lightheadedness, syncope, nausea, vomiting, disorientation, confusion.    Past Medical History  Diagnosis Date  . DJD (degenerative joint disease)   . Bursitis   . Hyperlipemia   . Depression   . GERD (gastroesophageal reflux disease)   . Obesities, morbid   . Urinary incontinence    Past Surgical History  Procedure Laterality Date  . Neck surgery    . Cardiac catheterization    . Total knee arthroplasty      x2  . Vaginal hysterectomy     Family History  Problem Relation Age of Onset  . Lung disease    . Heart attack     History  Substance Use Topics  . Smoking status: Never Smoker   . Smokeless tobacco: Not on file  . Alcohol Use: No   OB History   Grav Para Term Preterm Abortions TAB SAB Ect Mult Living                 Review of Systems  Constitutional:       Per HPI, otherwise negative  HENT:       Per HPI, otherwise negative  Respiratory:       Per HPI, otherwise negative  Cardiovascular:       Per HPI, otherwise negative  Gastrointestinal: Negative for vomiting.  Endocrine:       Negative aside from HPI  Genitourinary:       Neg aside from HPI   Musculoskeletal:       Per HPI, otherwise negative  Skin: Negative.   Neurological: Negative for  syncope.    Allergies  Contrast media; Penicillins; Sulfa antibiotics; and Morphine and related  Home Medications   Current Outpatient Rx  Name  Route  Sig  Dispense  Refill  . aspirin 81 MG EC tablet   Oral   Take 81 mg by mouth daily.           Marland Kitchen atorvastatin (LIPITOR) 10 MG tablet   Oral   Take 10 mg by mouth daily.         . Cholecalciferol (VITAMIN D3) 2000 UNITS TABS   Oral   Take 2,000 Units by mouth daily.          . clonazePAM (KLONOPIN) 0.5 MG tablet   Oral   Take 0.5 mg by mouth 2 (two) times daily as needed for anxiety.         . cyclobenzaprine (FLEXERIL) 10 MG tablet   Oral   Take 10 mg by mouth 3 (three) times daily as needed for muscle spasms (up to three times daily).         . hydrochlorothiazide (,MICROZIDE/HYDRODIURIL,) 12.5 MG capsule   Oral   Take 12.5 mg by mouth daily.           Marland Kitchen  HYDROcodone-acetaminophen (NORCO) 10-325 MG per tablet   Oral   Take 1 tablet by mouth every 4 (four) hours as needed for pain.         Marland Kitchen ibuprofen (ADVIL,MOTRIN) 600 MG tablet   Oral   Take 600 mg by mouth every 8 (eight) hours as needed for pain.         Marland Kitchen levothyroxine (SYNTHROID, LEVOTHROID) 50 MCG tablet   Oral   Take 50 mcg by mouth daily.           Marland Kitchen oxyCODONE-acetaminophen (PERCOCET) 10-325 MG per tablet   Oral   Take 1 tablet by mouth every 4 (four) hours as needed for pain.         Marland Kitchen quinapril (ACCUPRIL) 40 MG tablet   Oral   Take 40 mg by mouth at bedtime.           . sertraline (ZOLOFT) 100 MG tablet   Oral   Take 200 mg by mouth daily.           BP 83/34  Pulse 111  Temp(Src) 97.9 F (36.6 C) (Oral)  Resp 20  SpO2 95% Physical Exam  Nursing note and vitals reviewed. Constitutional: She is oriented to person, place, and time. She appears well-developed and well-nourished.  Obese elderly female resting in bed with a sling on left arm.  HENT:  Head: Normocephalic and atraumatic.  Eyes: Conjunctivae and EOM are  normal.  Neck: Trachea normal and full passive range of motion without pain. Muscular tenderness present. No spinous process tenderness present. No rigidity. No edema, no erythema and normal range of motion present. No Brudzinski's sign noted.  Cardiovascular: Normal rate and regular rhythm.   Pulmonary/Chest: Effort normal and breath sounds normal. No stridor. No respiratory distress.  Abdominal: She exhibits no distension.  Musculoskeletal: She exhibits no edema.       Left shoulder: She exhibits decreased range of motion, tenderness, bony tenderness, swelling, effusion, crepitus, deformity, pain and spasm. She exhibits no laceration, normal pulse and normal strength.       Left elbow: Normal.       Left wrist: Normal.  Patient has tenderness to palpation about both anterior hips, laterally, superiorly.  She does flex each hip independently.  Hips have no deformities, otherwise no notable tenderness to palpation.   Left knee and right knee both have midline scar from prior surgery. The left knee has a notable ecchymotic changes with edema.  Range of motion is limited to 180/110. Distal neurovascular status is appropriate.   Neurological: She is alert and oriented to person, place, and time. She displays no atrophy and no tremor. No cranial nerve deficit or sensory deficit. She exhibits normal muscle tone. She displays no seizure activity.  Skin: Skin is warm and dry.  Psychiatric: She has a normal mood and affect.    ED Course  Procedures (including critical care time) Labs Reviewed  COMPREHENSIVE METABOLIC PANEL - Abnormal; Notable for the following:    Chloride 95 (*)    Glucose, Bld 124 (*)    BUN 34 (*)    Creatinine, Ser 1.30 (*)    Albumin 2.9 (*)    GFR calc non Af Amer 41 (*)    GFR calc Af Amer 48 (*)    All other components within normal limits  CBC WITH DIFFERENTIAL - Abnormal; Notable for the following:    WBC 17.3 (*)    Hemoglobin 11.9 (*)    HCT 35.3 (*)  Neutro Abs 12.5 (*)    Monocytes Absolute 1.3 (*)    All other components within normal limits  URINALYSIS, ROUTINE W REFLEX MICROSCOPIC   Dg Hip Complete Left  02/16/2013   *RADIOLOGY REPORT*  Clinical Data: Fall.  Bilateral hip pain.  LEFT HIP - COMPLETE 2+ VIEW  Comparison: Right hip films of the same day.  Findings: The left hip is located.  No acute bone or soft tissue abnormalities are present.  IMPRESSION: No acute abnormality.   Original Report Authenticated By: Marin Roberts, M.D.   Dg Hip Complete Right  02/16/2013   *RADIOLOGY REPORT*  Clinical Data: Fall.  Bilateral hip pain.  RIGHT HIP - COMPLETE 2+ VIEW  Comparison: CT abdomen and pelvis 11/15/2010 at [facility].  Findings: No acute bone or soft tissue abnormalities are evident. The right hip is located.  Mild osteopenia is evident. Degenerative changes are noted at the SI joints and lower lumbar spine.  IMPRESSION:  1.  No acute abnormality. 2.  Mild osteopenia. 3.  Degenerative changes.   Original Report Authenticated By: Marin Roberts, M.D.   Dg Knee Complete 4 Views Left  02/16/2013   *RADIOLOGY REPORT*  Clinical Data: Fall.  Lateral knee pain.  LEFT KNEE - COMPLETE 4+ VIEW  Comparison: None.  Findings: The the patient is status post left total knee arthroplasty.  Knee is located.  No acute bone or soft tissue abnormalities are present.  There is no significant joint effusion.  IMPRESSION:  1.  Status post left total knee arthroplasty. 2.  No acute abnormality.   Original Report Authenticated By: Marin Roberts, M.D.   No diagnosis found.   I reviewed the patient's chart. Pulse oximetry 100% room air normal  Cardiac: 95sr, normal  EKG with rate 104, sinus tachycardia, with premature atrial complexes, LAD, abnormal EKG.  No   Patient's orthopedist has recommendation for CT shoulder. 8:07 PM HR improved, BP improved.  Update: Patient appears better.  I reviewed the results with her and her family  members.  With the patient's ongoing disequilibrium, although she is largely reassuring labs, we discussed nursing home placement.  The patient was not amenable to this suggestion. She was amenable to additional home health, which she will have a prescription for.  Update: I reviewed the patient's evaluation, imaging, including CT with her and her family. MDM  Patient presents after a series of the falls.  Notably, the patient was briefly hypotensive after receiving initial analgesics.  However, she is awake and alert throughout her emergency department stay.  She rate in no distress, with appropriate pain reduction with different analgesics. Patient's demonstration of preserved neurologic the capacity, the absence of notable new traumatic findings is all reassuring.  The patient does have humerus fracture.  This was imaged, and results were demonstrated to the patient and her family members.  She has followup with orthopedist for additional evaluation and management already.  I suggested that the patient could be admitted for rehabilitation or nursing home placement given her recurrent falls.  The patient was not agreeable to this suggestion.  Gerhard Munch, MD 02/16/13 2213

## 2013-02-16 NOTE — ED Notes (Signed)
The pt returned from c-t iv nss was supposed  To be running wo.  She came back with the iv disconneted from xray so the iv fluid  Had not run in

## 2013-02-16 NOTE — ED Notes (Signed)
The pt arrived from [facility] from home.  She fell one week ago and fractured her lt humerus and she fell again today.  C/o pain in that shoulder distal pulses.  ems gave her ms 4mg  enroute for pain.  bp low after ms given

## 2013-02-16 NOTE — ED Notes (Signed)
Liter of nss has infused.

## 2013-02-16 NOTE — ED Notes (Signed)
Iv med given for pain

## 2013-02-16 NOTE — ED Notes (Signed)
The pt returned from c-t 

## 2013-02-16 NOTE — ED Notes (Signed)
Pt given an ice pack for her lt knee

## 2013-02-16 NOTE — ED Notes (Signed)
The pt has fallen x 3 in the past week.  Her daughter reports that the pts knees give way when she attempts to walk. Pt alert bruising with swelling

## 2013-02-16 NOTE — ED Notes (Signed)
Pt to xray

## 2013-02-21 DIAGNOSIS — M25519 Pain in unspecified shoulder: Secondary | ICD-10-CM | POA: Diagnosis not present

## 2013-02-25 DIAGNOSIS — M24873 Other specific joint derangements of unspecified ankle, not elsewhere classified: Secondary | ICD-10-CM | POA: Diagnosis not present

## 2013-02-25 DIAGNOSIS — M24876 Other specific joint derangements of unspecified foot, not elsewhere classified: Secondary | ICD-10-CM | POA: Diagnosis not present

## 2013-02-25 DIAGNOSIS — M549 Dorsalgia, unspecified: Secondary | ICD-10-CM | POA: Diagnosis not present

## 2013-02-25 DIAGNOSIS — M25569 Pain in unspecified knee: Secondary | ICD-10-CM | POA: Diagnosis not present

## 2013-02-25 DIAGNOSIS — R269 Unspecified abnormalities of gait and mobility: Secondary | ICD-10-CM | POA: Diagnosis not present

## 2013-03-03 DIAGNOSIS — S42209A Unspecified fracture of upper end of unspecified humerus, initial encounter for closed fracture: Secondary | ICD-10-CM | POA: Diagnosis not present

## 2013-03-24 DIAGNOSIS — S42209A Unspecified fracture of upper end of unspecified humerus, initial encounter for closed fracture: Secondary | ICD-10-CM | POA: Diagnosis not present

## 2013-05-17 DIAGNOSIS — M5137 Other intervertebral disc degeneration, lumbosacral region: Secondary | ICD-10-CM | POA: Diagnosis not present

## 2013-06-08 DIAGNOSIS — I10 Essential (primary) hypertension: Secondary | ICD-10-CM | POA: Diagnosis not present

## 2013-06-08 DIAGNOSIS — E039 Hypothyroidism, unspecified: Secondary | ICD-10-CM | POA: Diagnosis not present

## 2013-06-08 DIAGNOSIS — R7309 Other abnormal glucose: Secondary | ICD-10-CM | POA: Diagnosis not present

## 2013-06-08 DIAGNOSIS — Z23 Encounter for immunization: Secondary | ICD-10-CM | POA: Diagnosis not present

## 2013-06-08 DIAGNOSIS — B359 Dermatophytosis, unspecified: Secondary | ICD-10-CM | POA: Diagnosis not present

## 2013-06-14 DIAGNOSIS — M5137 Other intervertebral disc degeneration, lumbosacral region: Secondary | ICD-10-CM | POA: Diagnosis not present

## 2013-07-13 ENCOUNTER — Other Ambulatory Visit: Payer: Self-pay | Admitting: Internal Medicine

## 2013-07-13 DIAGNOSIS — N644 Mastodynia: Secondary | ICD-10-CM

## 2013-07-13 DIAGNOSIS — R921 Mammographic calcification found on diagnostic imaging of breast: Secondary | ICD-10-CM

## 2013-07-13 DIAGNOSIS — N63 Unspecified lump in unspecified breast: Secondary | ICD-10-CM

## 2013-07-22 ENCOUNTER — Ambulatory Visit
Admission: RE | Admit: 2013-07-22 | Discharge: 2013-07-22 | Disposition: A | Discharge status: Home / Self Care | Payer: Medicare Other | Source: Ambulatory Visit | Attending: Internal Medicine | Admitting: Internal Medicine

## 2013-07-22 DIAGNOSIS — N63 Unspecified lump in unspecified breast: Secondary | ICD-10-CM

## 2013-07-22 DIAGNOSIS — N644 Mastodynia: Secondary | ICD-10-CM

## 2013-07-22 DIAGNOSIS — R928 Other abnormal and inconclusive findings on diagnostic imaging of breast: Secondary | ICD-10-CM | POA: Diagnosis not present

## 2013-07-22 DIAGNOSIS — R921 Mammographic calcification found on diagnostic imaging of breast: Secondary | ICD-10-CM

## 2013-08-03 DIAGNOSIS — L821 Other seborrheic keratosis: Secondary | ICD-10-CM | POA: Diagnosis not present

## 2013-08-03 DIAGNOSIS — B354 Tinea corporis: Secondary | ICD-10-CM | POA: Diagnosis not present

## 2013-10-13 DIAGNOSIS — M5137 Other intervertebral disc degeneration, lumbosacral region: Secondary | ICD-10-CM | POA: Diagnosis not present

## 2013-11-11 DIAGNOSIS — M5126 Other intervertebral disc displacement, lumbar region: Secondary | ICD-10-CM | POA: Diagnosis not present

## 2013-11-11 DIAGNOSIS — M4120 Other idiopathic scoliosis, site unspecified: Secondary | ICD-10-CM | POA: Diagnosis not present

## 2013-11-11 DIAGNOSIS — M48061 Spinal stenosis, lumbar region without neurogenic claudication: Secondary | ICD-10-CM | POA: Diagnosis not present

## 2013-11-22 DIAGNOSIS — H47329 Drusen of optic disc, unspecified eye: Secondary | ICD-10-CM | POA: Diagnosis not present

## 2013-11-22 DIAGNOSIS — H2510 Age-related nuclear cataract, unspecified eye: Secondary | ICD-10-CM | POA: Diagnosis not present

## 2013-11-29 DIAGNOSIS — H2510 Age-related nuclear cataract, unspecified eye: Secondary | ICD-10-CM | POA: Diagnosis not present

## 2013-11-29 DIAGNOSIS — H02839 Dermatochalasis of unspecified eye, unspecified eyelid: Secondary | ICD-10-CM | POA: Diagnosis not present

## 2013-11-29 DIAGNOSIS — H47329 Drusen of optic disc, unspecified eye: Secondary | ICD-10-CM | POA: Diagnosis not present

## 2013-12-08 DIAGNOSIS — M5137 Other intervertebral disc degeneration, lumbosacral region: Secondary | ICD-10-CM | POA: Diagnosis not present

## 2013-12-12 DIAGNOSIS — H2510 Age-related nuclear cataract, unspecified eye: Secondary | ICD-10-CM | POA: Diagnosis not present

## 2013-12-13 DIAGNOSIS — H2589 Other age-related cataract: Secondary | ICD-10-CM | POA: Diagnosis not present

## 2013-12-19 DIAGNOSIS — H269 Unspecified cataract: Secondary | ICD-10-CM | POA: Diagnosis not present

## 2013-12-19 DIAGNOSIS — H2589 Other age-related cataract: Secondary | ICD-10-CM | POA: Diagnosis not present

## 2014-01-18 ENCOUNTER — Emergency Department (HOSPITAL_COMMUNITY): Payer: Medicare Other

## 2014-01-18 ENCOUNTER — Emergency Department (HOSPITAL_COMMUNITY)
Admission: EM | Admit: 2014-01-18 | Discharge: 2014-01-18 | Disposition: A | Discharge status: Home / Self Care | Payer: Medicare Other | Attending: Emergency Medicine | Admitting: Emergency Medicine

## 2014-01-18 ENCOUNTER — Encounter (HOSPITAL_COMMUNITY): Payer: Self-pay | Admitting: Emergency Medicine

## 2014-01-18 ENCOUNTER — Other Ambulatory Visit: Payer: Self-pay | Admitting: Family Medicine

## 2014-01-18 DIAGNOSIS — Z8639 Personal history of other endocrine, nutritional and metabolic disease: Secondary | ICD-10-CM | POA: Insufficient documentation

## 2014-01-18 DIAGNOSIS — Z8719 Personal history of other diseases of the digestive system: Secondary | ICD-10-CM | POA: Diagnosis not present

## 2014-01-18 DIAGNOSIS — R51 Headache: Secondary | ICD-10-CM | POA: Insufficient documentation

## 2014-01-18 DIAGNOSIS — Z862 Personal history of diseases of the blood and blood-forming organs and certain disorders involving the immune mechanism: Secondary | ICD-10-CM | POA: Insufficient documentation

## 2014-01-18 DIAGNOSIS — R0989 Other specified symptoms and signs involving the circulatory and respiratory systems: Secondary | ICD-10-CM | POA: Insufficient documentation

## 2014-01-18 DIAGNOSIS — R0600 Dyspnea, unspecified: Secondary | ICD-10-CM

## 2014-01-18 DIAGNOSIS — Z8739 Personal history of other diseases of the musculoskeletal system and connective tissue: Secondary | ICD-10-CM | POA: Insufficient documentation

## 2014-01-18 DIAGNOSIS — R079 Chest pain, unspecified: Secondary | ICD-10-CM | POA: Diagnosis not present

## 2014-01-18 DIAGNOSIS — F329 Major depressive disorder, single episode, unspecified: Secondary | ICD-10-CM | POA: Insufficient documentation

## 2014-01-18 DIAGNOSIS — R0609 Other forms of dyspnea: Secondary | ICD-10-CM | POA: Diagnosis not present

## 2014-01-18 DIAGNOSIS — M7989 Other specified soft tissue disorders: Secondary | ICD-10-CM

## 2014-01-18 DIAGNOSIS — Z79899 Other long term (current) drug therapy: Secondary | ICD-10-CM | POA: Insufficient documentation

## 2014-01-18 DIAGNOSIS — Z880 Allergy status to penicillin: Secondary | ICD-10-CM | POA: Diagnosis not present

## 2014-01-18 DIAGNOSIS — I8310 Varicose veins of unspecified lower extremity with inflammation: Secondary | ICD-10-CM | POA: Diagnosis not present

## 2014-01-18 DIAGNOSIS — F3289 Other specified depressive episodes: Secondary | ICD-10-CM | POA: Insufficient documentation

## 2014-01-18 DIAGNOSIS — R0602 Shortness of breath: Secondary | ICD-10-CM

## 2014-01-18 LAB — BASIC METABOLIC PANEL
BUN: 16 mg/dL (ref 6–23)
CALCIUM: 10 mg/dL (ref 8.4–10.5)
CHLORIDE: 99 meq/L (ref 96–112)
CO2: 29 meq/L (ref 19–32)
Creatinine, Ser: 0.72 mg/dL (ref 0.50–1.10)
GFR calc Af Amer: >90 mL/min (ref >90)
GFR calc non Af Amer: 86 mL/min — ABNORMAL LOW (ref >90)
GLUCOSE: 118 mg/dL — AB (ref 70–99)
POTASSIUM: 3.8 meq/L (ref 3.7–5.3)
SODIUM: 142 meq/L (ref 137–147)

## 2014-01-18 LAB — CBC
HCT: 42.3 % (ref 36.0–46.0)
HEMOGLOBIN: 13.9 g/dL (ref 12.0–15.0)
MCH: 29.6 pg (ref 26.0–34.0)
MCHC: 32.9 g/dL (ref 30.0–36.0)
MCV: 90 fL (ref 78.0–100.0)
PLATELETS: 234 10*3/uL (ref 150–400)
RBC: 4.7 MIL/uL (ref 3.87–5.11)
RDW: 13.4 % (ref 11.5–15.5)
WBC: 12.6 10*3/uL — AB (ref 4.0–10.5)

## 2014-01-18 LAB — I-STAT TROPONIN, ED
TROPONIN I, POC: 0 ng/mL (ref 0.00–0.08)
TROPONIN I, POC: 0.01 ng/mL (ref 0.00–0.08)

## 2014-01-18 LAB — PRO B NATRIURETIC PEPTIDE: PRO B NATRI PEPTIDE: 130.2 pg/mL — AB (ref 0–125)

## 2014-01-18 LAB — D-DIMER, QUANTITATIVE (NOT AT ARMC): D DIMER QUANT: 1.32 ug{FEU}/mL — AB (ref 0.00–0.48)

## 2014-01-18 MED ORDER — HYDROCODONE-ACETAMINOPHEN 10-325 MG PO TABS: 1.0000 | ORAL_TABLET | ORAL | Status: DC | PRN

## 2014-01-18 MED ORDER — HYDROCODONE-ACETAMINOPHEN 5-325 MG PO TABS
2.0000 | ORAL_TABLET | ORAL | Status: DC | PRN
  Administered 2014-01-18: 2 via ORAL
  Filled 2014-01-18: qty 2

## 2014-01-18 MED ORDER — TECHNETIUM TC 99M DIETHYLENETRIAME-PENTAACETIC ACID: 40.0000 | Freq: Once | INTRAVENOUS | Status: DC | PRN

## 2014-01-18 MED ORDER — TECHNETIUM TO 99M ALBUMIN AGGREGATED
6.0000 | Freq: Once | INTRAVENOUS | Status: AC | PRN
  Administered 2014-01-18: 6 via INTRAVENOUS

## 2014-01-18 NOTE — ED Notes (Signed)
Spoke with radiology, will take 1 hour to mix medication for VQ scan.

## 2014-01-18 NOTE — ED Notes (Signed)
Pt off unit for pulmonary perfusion

## 2014-01-18 NOTE — ED Notes (Signed)
Pt undressed for vascular.

## 2014-01-18 NOTE — ED Notes (Signed)
Pt reports central dull chest pressure x months. States she has intermittent diaphoresis. Pt also reports progressively worsening SOB with exertion x "months". States increase in swelling to legs as well for several months. Pt speaks in complete sentences, NAD. Denies chest pain at this time.

## 2014-01-18 NOTE — Progress Notes (Signed)
VASCULAR LAB PRELIMINARY  PRELIMINARY  PRELIMINARY  PRELIMINARY  Bilateral lower extremity venous duplex completed.    Preliminary report:  Bilateral:  No evidence of DVT, superficial thrombosis, or Baker's Cyst.   SLAUGHTER, [place], RVS 01/18/2014, 7:06 PM

## 2014-01-18 NOTE — Discharge Instructions (Signed)

## 2014-01-18 NOTE — ED Provider Notes (Addendum)
CSN: [id number]     Arrival date & time 01/18/14  1305 History   First MD Initiated Contact with Patient 01/18/14 1557     Chief Complaint  Patient presents with  . Chest Pain  . Shortness of Breath     (Consider location/radiation/quality/duration/timing/severity/associated sxs/prior Treatment) Patient is a 68 y.o. female presenting with chest pain and shortness of breath. The history is provided by the patient.  Chest Pain Associated symptoms: headache   Associated symptoms: no abdominal pain, no back pain, no fever, no nausea, no shortness of breath and not vomiting   Shortness of Breath Associated symptoms: chest pain and headaches   Associated symptoms: no abdominal pain, no fever, no rash and no vomiting    patient with several month history of exertional shortness of breath. Associated with some mild dull chest pain. But the main complaint is the shortness of breath. Patient also had increased swelling in her legs. Patient was at being clinic earlier today they noted the leg swelling and found out about the history of his exertional shortness of breath and a center here for further evaluation. None of this is new or worse. Patient states that is just dull chest pain nonradiating shortness breath is the main complaint. Patient walks any distance she gets short of breath. Patient does not have a history of asthma or COPD and is a nonsmoker. Past Medical History  Diagnosis Date  . DJD (degenerative joint disease)   . Bursitis   . Hyperlipemia   . Depression   . GERD (gastroesophageal reflux disease)   . Obesities, morbid   . Urinary incontinence    Past Surgical History  Procedure Laterality Date  . Neck surgery    . Cardiac catheterization    . Total knee arthroplasty      x2  . Vaginal hysterectomy     Family History  Problem Relation Age of Onset  . Lung disease    . Heart attack     History  Substance Use Topics  . Smoking status: Never Smoker   . Smokeless  tobacco: Not on file  . Alcohol Use: No   OB History   Grav Para Term Preterm Abortions TAB SAB Ect Mult Living                 Review of Systems  Constitutional: Negative for fever.  HENT: Negative for congestion.   Eyes: Negative for visual disturbance.  Respiratory: Negative for shortness of breath.   Cardiovascular: Positive for chest pain and leg swelling.  Gastrointestinal: Negative for nausea, vomiting and abdominal pain.  Genitourinary: Negative for dysuria.  Musculoskeletal: Negative for back pain.  Skin: Negative for rash.  Neurological: Positive for headaches.  Hematological: Does not bruise/bleed easily.  Psychiatric/Behavioral: Negative for confusion.      Allergies  Contrast media; Penicillins; Sulfa antibiotics; and Morphine and related  Home Medications   Prior to Admission medications   Medication Sig Start Date End Date Taking? Authorizing Provider  acetaminophen (TYLENOL) 500 MG tablet Take 1,000 mg by mouth every 6 (six) hours as needed for moderate pain.   Yes Historical Provider, MD  aspirin 81 MG EC tablet Take 81 mg by mouth daily.     Yes Historical Provider, MD  atorvastatin (LIPITOR) 10 MG tablet Take 10 mg by mouth daily.   Yes Historical Provider, MD  Cholecalciferol (VITAMIN D3) 2000 UNITS TABS Take 2,000 Units by mouth daily.    Yes Historical Provider, MD  clonazePAM (KLONOPIN) 0.5  MG tablet Take 0.5 mg by mouth 2 (two) times daily as needed for anxiety.   Yes Historical Provider, MD  cyclobenzaprine (FLEXERIL) 10 MG tablet Take 10 mg by mouth 3 (three) times daily as needed for muscle spasms (up to three times daily).   Yes Historical Provider, MD  hydrochlorothiazide (,MICROZIDE/HYDRODIURIL,) 12.5 MG capsule Take 12.5 mg by mouth daily.     Yes Historical Provider, MD  HYDROcodone-acetaminophen (NORCO) 10-325 MG per tablet Take 1 tablet by mouth every 4 (four) hours as needed for pain.   Yes Historical Provider, MD  ibuprofen (ADVIL,MOTRIN)  600 MG tablet Take 600 mg by mouth every 8 (eight) hours as needed for pain.   Yes Historical Provider, MD  levothyroxine (SYNTHROID, LEVOTHROID) 50 MCG tablet Take 50 mcg by mouth daily.     Yes Historical Provider, MD  quinapril (ACCUPRIL) 40 MG tablet Take 40 mg by mouth at bedtime.     Yes Historical Provider, MD  sertraline (ZOLOFT) 100 MG tablet Take 200 mg by mouth daily.    Yes Historical Provider, MD   BP 136/105  Pulse 84  Temp(Src) 97.6 F (36.4 C) (Oral)  Resp 23  SpO2 96% Physical Exam  Nursing note and vitals reviewed. Constitutional: She is oriented to person, place, and time. She appears well-developed and well-nourished. No distress.  HENT:  Head: Normocephalic and atraumatic.  Mouth/Throat: Oropharynx is clear and moist.  Eyes: Conjunctivae are normal. Pupils are equal, round, and reactive to light.  Neck: Normal range of motion.  Cardiovascular: Normal rate, regular rhythm and normal heart sounds.   No murmur heard. Pulmonary/Chest: Effort normal and breath sounds normal. No respiratory distress. She has no wheezes. She has no rales.  Abdominal: Soft. Bowel sounds are normal. There is no tenderness.  Musculoskeletal: Normal range of motion. She exhibits edema.  Neurological: She is alert and oriented to person, place, and time. No cranial nerve deficit. She exhibits normal muscle tone. Coordination normal.  Skin: Skin is warm. No rash noted.    ED Course  Procedures (including critical care time) Labs Review Labs Reviewed  CBC - Abnormal; Notable for the following:    WBC 12.6 (*)    All other components within normal limits  BASIC METABOLIC PANEL - Abnormal; Notable for the following:    Glucose, Bld 118 (*)    GFR calc non Af Amer 86 (*)    All other components within normal limits  PRO B NATRIURETIC PEPTIDE - Abnormal; Notable for the following:    Pro B Natriuretic peptide (BNP) 130.2 (*)    All other components within normal limits  D-DIMER,  QUANTITATIVE - Abnormal; Notable for the following:    D-Dimer, Quant 1.32 (*)    All other components within normal limits  I-STAT TROPOININ, ED  I-STAT TROPOININ, ED   Results for orders placed during the hospital encounter of 01/18/14  CBC      Result Value Ref Range   WBC 12.6 (*) 4.0 - 10.5 K/uL   RBC 4.70  3.87 - 5.11 MIL/uL   Hemoglobin 13.9  12.0 - 15.0 g/dL   HCT 42.3  36.0 - 46.0 %   MCV 90.0  78.0 - 100.0 fL   MCH 29.6  26.0 - 34.0 pg   MCHC 32.9  30.0 - 36.0 g/dL   RDW 13.4  11.5 - 15.5 %   Platelets 234  150 - 400 K/uL  BASIC METABOLIC PANEL      Result Value Ref  Range   Sodium 142  137 - 147 mEq/L   Potassium 3.8  3.7 - 5.3 mEq/L   Chloride 99  96 - 112 mEq/L   CO2 29  19 - 32 mEq/L   Glucose, Bld 118 (*) 70 - 99 mg/dL   BUN 16  6 - 23 mg/dL   Creatinine, Ser 0.72  0.50 - 1.10 mg/dL   Calcium 10.0  8.4 - 10.5 mg/dL   GFR calc non Af Amer 86 (*) >90 mL/min   GFR calc Af Amer >90  >90 mL/min  PRO B NATRIURETIC PEPTIDE      Result Value Ref Range   Pro B Natriuretic peptide (BNP) 130.2 (*) 0 - 125 pg/mL  D-DIMER, QUANTITATIVE      Result Value Ref Range   D-Dimer, Quant 1.32 (*) 0.00 - 0.48 ug/mL-FEU  I-STAT TROPOININ, ED      Result Value Ref Range   Troponin i, poc 0.00  0.00 - 0.08 ng/mL   Comment 3           I-STAT TROPOININ, ED      Result Value Ref Range   Troponin i, poc 0.01  0.00 - 0.08 ng/mL   Comment 3              Imaging Review Dg Chest 2 View  01/18/2014   CLINICAL DATA:  Shortness of breath.  Chest pain.  Dizziness.  EXAM: CHEST  2 VIEW  COMPARISON:  11/18/2010  FINDINGS: The heart size and mediastinal contours are within normal limits. Both lungs are clear. The visualized skeletal structures are unremarkable.  IMPRESSION: No acute abnormality.  No change since the prior exam.   Electronically Signed   By: Rozetta Nunnery M.D.   On: 01/18/2014 14:25   Ct Chest Wo Contrast  01/18/2014   CLINICAL DATA:  Chest pain and shortness of breath.   EXAM: CT CHEST WITHOUT CONTRAST  TECHNIQUE: Multidetector CT imaging of the chest was performed following the standard protocol without IV contrast.  COMPARISON:  Chest x-ray 01/18/2014  FINDINGS: The chest wall is unremarkable. No breast masses, supraclavicular or axillary adenopathy. The thyroid gland is grossly normal. The bony thorax is intact. There appears to be chronic subluxation of the left humeral head and probable remote fracture. Cervical fusion hardware is noted. The thoracic vertebral bodies are normally aligned. No fracture or destructive bony changes. Moderate degenerative changes are noted.  The heart is normal in size. No pericardial effusion. Aortic and coronary artery calcifications are noted. No focal aneurysm. No mediastinal or hilar mass or adenopathy. The esophagus is grossly normal.  Examination of the lung parenchyma demonstrates patchy areas of subsegmental and deep and atelectasis but no infiltrates, edema or effusions. No pneumothorax. No worrisome pulmonary lesions.  The upper abdomen is unremarkable.  IMPRESSION: No acute pulmonary findings. No worrisome pulmonary lesions or pleural effusion.  No mediastinal or hilar mass or adenopathy.  Scattered atherosclerotic calcifications involving the aorta and three-vessel coronary artery calcifications.   Electronically Signed   By: Kalman Jewels M.D.   On: 01/18/2014 17:29     EKG Interpretation   Date/Time:  Wednesday January 18 2014 13:07:18 EDT Ventricular Rate:  96 PR Interval:  144 QRS Duration: 78 QT Interval:  358 QTC Calculation: 452 R Axis:   -42 Text Interpretation:  Normal sinus rhythm Left axis deviation Abnormal ECG  No significant change since last tracing Confirmed by ZACKOWSKI  MD, SCOTT  ([id number]) on 01/18/2014 3:50:31 PM  MDM   Final diagnoses:  Dyspnea on exertion    The patient presenting with a complaint of shortness of breath on exertion according to daughter's been present for many months maybe  9 months. Patient also with bilateral leg swelling. Patient was at the pain clinic today they noted the swelling. The history of the shortness of breath and she was referred in for further evaluation. Patient's workup here a chest x-ray without any significant findings. Troponin negative x2. BMP without significant elevation. Dimer was elevated over 1. Doppler studies ordered of both legs where there is swelling. Patient is not eligible for CT and you do to a significant dye allergy. CT of chest so did not show any pulmonary edema or any subtle pneumonia findings. Patient will go for VQ scan. Disposition will be based on the results of the Doppler studies of the leg in VQ scan. If it's shows a PE patient will require admission if it's equivocal patient may require admission and observation if it's negative or low probability patient can probably go home.  Patient was ambulated here in the emergency department to evaluate her shortness of breath. Patient ambulated fine she went with a pulse ox oxygen saturation is never dropped below 90%.   Fredia Sorrow, MD 01/18/14 1926  Doppler studies of both legs without evidence of DVT or any other abnormalities. Still waiting on VQ scan. Patient will be turned over to the evening mid-level for disposition.  Fredia Sorrow, MD 01/18/14 2035

## 2014-01-18 NOTE — ED Notes (Signed)
Pt c/o SOB when standing too long, in the hot weather or anytime walking more than 10 feet.  She becomes dizzy.  She has fallen 4 times in the last 25month after feeling out of breath and dizzy.

## 2014-01-18 NOTE — ED Notes (Signed)
Pt taken to CT with Roderic Palau

## 2014-01-18 NOTE — ED Notes (Signed)
Pt rt from CT.

## 2014-01-18 NOTE — ED Notes (Signed)
Pager 2 

## 2014-01-18 NOTE — ED Notes (Addendum)
Pt ambulated in hallway on pulse oximetry. Ranged from 90%-94% during ambulation, went up to 98%-100% once pt returned to bed.

## 2014-01-18 NOTE — ED Provider Notes (Signed)
2325 - Patient care assumed from Dr. Rogene Houston at 2000. Patient presenting for dyspnea on exertion. Patient with V/Q scan pending at shift change. Plan discussed with Dr. Venita Sheffield which includes discharge if VQ scan shows low probability of pulmonary embolism. Imaging results reviewed. V/Q today with low probability of PE. Patient has been hemodynamically stable throughout ED course. She is able to ambulate without hypoxia. Cardiac workup also negative. Patient stable and appropriate for discharge with instruction to followup with her primary care provider. Return precautions provided and patient agreeable to plan with no unaddressed concerns.   Filed Vitals:   01/18/14 2100 01/18/14 2130 01/18/14 2145 01/18/14 2316  BP: 118/77   121/99  Pulse: 81 84 70   Temp:      TempSrc:      Resp: 17 18 21 15   SpO2: 95% 95% 94%     Dg Chest 2 View  01/18/2014   CLINICAL DATA:  Shortness of breath.  Chest pain.  Dizziness.  EXAM: CHEST  2 VIEW  COMPARISON:  11/18/2010  FINDINGS: The heart size and mediastinal contours are within normal limits. Both lungs are clear. The visualized skeletal structures are unremarkable.  IMPRESSION: No acute abnormality.  No change since the prior exam.   Electronically Signed   By: Rozetta Nunnery M.D.   On: 01/18/2014 14:25   Ct Chest Wo Contrast  01/18/2014   CLINICAL DATA:  Chest pain and shortness of breath.  EXAM: CT CHEST WITHOUT CONTRAST  TECHNIQUE: Multidetector CT imaging of the chest was performed following the standard protocol without IV contrast.  COMPARISON:  Chest x-ray 01/18/2014  FINDINGS: The chest wall is unremarkable. No breast masses, supraclavicular or axillary adenopathy. The thyroid gland is grossly normal. The bony thorax is intact. There appears to be chronic subluxation of the left humeral head and probable remote fracture. Cervical fusion hardware is noted. The thoracic vertebral bodies are normally aligned. No fracture or destructive bony changes.  Moderate degenerative changes are noted.  The heart is normal in size. No pericardial effusion. Aortic and coronary artery calcifications are noted. No focal aneurysm. No mediastinal or hilar mass or adenopathy. The esophagus is grossly normal.  Examination of the lung parenchyma demonstrates patchy areas of subsegmental and deep and atelectasis but no infiltrates, edema or effusions. No pneumothorax. No worrisome pulmonary lesions.  The upper abdomen is unremarkable.  IMPRESSION: No acute pulmonary findings. No worrisome pulmonary lesions or pleural effusion.  No mediastinal or hilar mass or adenopathy.  Scattered atherosclerotic calcifications involving the aorta and three-vessel coronary artery calcifications.   Electronically Signed   By: Kalman Jewels M.D.   On: 01/18/2014 17:29   Nm Pulmonary Perf And Vent  01/18/2014   CLINICAL DATA:  Shortness of breath and chest pain for a couple of weeks.  EXAM: NUCLEAR MEDICINE VENTILATION - PERFUSION LUNG SCAN  TECHNIQUE: Ventilation images were obtained in multiple projections using inhaled aerosol technetium 99 M DTPA. Perfusion images were obtained in multiple projections after intravenous injection of Tc-60m MAA.  RADIOPHARMACEUTICALS:  40 mCi Tc-7m DTPA aerosol and 6 mCi Tc-51m MAA  COMPARISON:  11/18/2010  FINDINGS: Ventilation: There is somewhat heterogeneous uptake of tracer activity in the lungs bilaterally with significant non tagged activity in the GI tract.  Perfusion: No wedge shaped peripheral perfusion defects to suggest acute pulmonary embolism.  IMPRESSION: Low probability of pulmonary embolus.   Electronically Signed   By: Lucienne Capers M.D.   On: 01/18/2014 23:20  Antonietta Breach, PA-C 01/18/14 2340

## 2014-01-19 NOTE — ED Provider Notes (Signed)
Medical screening examination/treatment/procedure(s) were performed by non-physician practitioner and as supervising physician I was immediately available for consultation/collaboration.   EKG Interpretation   Date/Time:  Wednesday January 18 2014 13:07:18 EDT Ventricular Rate:  96 PR Interval:  144 QRS Duration: 78 QT Interval:  358 QTC Calculation: 452 R Axis:   -42 Text Interpretation:  Normal sinus rhythm Left axis deviation Abnormal ECG  No significant change since last tracing Confirmed by ZACKOWSKI  MD, Morral  [phone]) on 01/18/2014 3:50:31 PM        Shea Evans Ruthe Mannan, MD 01/19/14 1756

## 2014-01-21 ENCOUNTER — Other Ambulatory Visit: Payer: Self-pay | Admitting: Family Medicine

## 2014-01-24 DIAGNOSIS — M5137 Other intervertebral disc degeneration, lumbosacral region: Secondary | ICD-10-CM | POA: Diagnosis not present

## 2014-02-21 DIAGNOSIS — M47817 Spondylosis without myelopathy or radiculopathy, lumbosacral region: Secondary | ICD-10-CM | POA: Diagnosis not present

## 2014-03-07 DIAGNOSIS — G894 Chronic pain syndrome: Secondary | ICD-10-CM | POA: Diagnosis not present

## 2014-03-07 DIAGNOSIS — M47817 Spondylosis without myelopathy or radiculopathy, lumbosacral region: Secondary | ICD-10-CM | POA: Diagnosis not present

## 2014-03-07 DIAGNOSIS — M545 Low back pain, unspecified: Secondary | ICD-10-CM | POA: Diagnosis not present

## 2014-03-30 DIAGNOSIS — M5137 Other intervertebral disc degeneration, lumbosacral region: Secondary | ICD-10-CM | POA: Diagnosis not present

## 2014-04-18 DIAGNOSIS — M5137 Other intervertebral disc degeneration, lumbosacral region: Secondary | ICD-10-CM | POA: Diagnosis not present

## 2014-04-18 DIAGNOSIS — M47817 Spondylosis without myelopathy or radiculopathy, lumbosacral region: Secondary | ICD-10-CM | POA: Diagnosis not present

## 2014-04-18 DIAGNOSIS — G894 Chronic pain syndrome: Secondary | ICD-10-CM | POA: Diagnosis not present

## 2014-04-18 DIAGNOSIS — M79609 Pain in unspecified limb: Secondary | ICD-10-CM | POA: Diagnosis not present

## 2014-05-03 DIAGNOSIS — G894 Chronic pain syndrome: Secondary | ICD-10-CM | POA: Diagnosis not present

## 2014-05-03 DIAGNOSIS — M5137 Other intervertebral disc degeneration, lumbosacral region: Secondary | ICD-10-CM | POA: Diagnosis not present

## 2014-05-03 DIAGNOSIS — M5417 Radiculopathy, lumbosacral region: Secondary | ICD-10-CM | POA: Diagnosis not present

## 2014-05-03 DIAGNOSIS — Z79899 Other long term (current) drug therapy: Secondary | ICD-10-CM | POA: Diagnosis not present

## 2014-05-04 DIAGNOSIS — M5137 Other intervertebral disc degeneration, lumbosacral region: Secondary | ICD-10-CM | POA: Diagnosis not present

## 2014-05-22 DIAGNOSIS — E039 Hypothyroidism, unspecified: Secondary | ICD-10-CM | POA: Diagnosis not present

## 2014-05-22 DIAGNOSIS — R739 Hyperglycemia, unspecified: Secondary | ICD-10-CM | POA: Diagnosis not present

## 2014-05-22 DIAGNOSIS — I10 Essential (primary) hypertension: Secondary | ICD-10-CM | POA: Diagnosis not present

## 2014-05-22 DIAGNOSIS — Z23 Encounter for immunization: Secondary | ICD-10-CM | POA: Diagnosis not present

## 2014-06-01 DIAGNOSIS — M961 Postlaminectomy syndrome, not elsewhere classified: Secondary | ICD-10-CM | POA: Diagnosis not present

## 2014-06-19 ENCOUNTER — Ambulatory Visit (INDEPENDENT_AMBULATORY_CARE_PROVIDER_SITE_OTHER): Payer: Medicare Other

## 2014-06-19 ENCOUNTER — Encounter: Payer: Self-pay | Admitting: Podiatry

## 2014-06-19 ENCOUNTER — Ambulatory Visit (INDEPENDENT_AMBULATORY_CARE_PROVIDER_SITE_OTHER): Payer: Medicare Other | Admitting: Podiatry

## 2014-06-19 VITALS — BP 135/79 | HR 72 | Resp 16

## 2014-06-19 DIAGNOSIS — M722 Plantar fascial fibromatosis: Secondary | ICD-10-CM

## 2014-06-19 DIAGNOSIS — M779 Enthesopathy, unspecified: Secondary | ICD-10-CM

## 2014-06-19 MED ORDER — TRIAMCINOLONE ACETONIDE 10 MG/ML IJ SUSP
10.0000 mg | Freq: Once | INTRAMUSCULAR | Status: AC
  Administered 2014-06-19: 10 mg

## 2014-06-19 NOTE — Progress Notes (Signed)
   Subjective:    Patient ID: Shirley Leblanc, female    DOB: 1946-03-14, 68 y.o.   MRN: [id number]  HPI Comments: "I turned my foot"  Patient states that she twisted foot, left, about 1 month. She fell in the driveway and the lateral foot and ankle on both are really swollen and sore. No home or professional treatment.  Foot Pain Associated symptoms include arthralgias and myalgias.      Review of Systems  HENT: Positive for hearing loss.   Respiratory: Positive for wheezing.   Cardiovascular: Positive for palpitations and leg swelling.  Endocrine: Positive for polyphagia.  Musculoskeletal: Positive for myalgias, back pain, arthralgias and gait problem.  Hematological: Bruises/bleeds easily.  All other systems reviewed and are negative.      Objective:   Physical Exam        Assessment & Plan:

## 2014-06-20 NOTE — Progress Notes (Signed)
Subjective:     Patient ID: Shirley Leblanc, female   DOB: Dec 26, 1945, 68 y.o.   MRN: [id number]  HPI patient presents stating that I twisted my ankle and I been having pain in both of them with the worse being the left over the right   Review of Systems  All other systems reviewed and are negative.      Objective:   Physical Exam  Constitutional: She is oriented to person, place, and time.  Cardiovascular: Intact distal pulses.   Musculoskeletal: Normal range of motion.  Neurological: She is oriented to person, place, and time.  Skin: Skin is warm.   neurovascular status intact muscle strength adequate and range of motion somewhat diminished. Patient does not have good inversion eversion of the ankles and I noted there to be quite a bit of discomfort in the sinus tarsi of both feet with mild discomfort in the lateral foot and into the forefoot around the metatarsal phalangeal joints. Patient's digits are well-perfused and the patient is well oriented 3     Assessment:     Probable inflammatory sinus tarsitis secondary to injury in gait    Plan:     H&P and x-rays reviewed and today I did careful sinus tarsi injection bilateral 3 mg Kenalog 5 mg Xylocaine and instructed on physical therapy supportive shoes and to reappoint if symptoms persist

## 2014-07-12 DIAGNOSIS — M549 Dorsalgia, unspecified: Secondary | ICD-10-CM | POA: Diagnosis not present

## 2014-07-12 DIAGNOSIS — M5136 Other intervertebral disc degeneration, lumbar region: Secondary | ICD-10-CM | POA: Diagnosis not present

## 2014-07-12 DIAGNOSIS — J45909 Unspecified asthma, uncomplicated: Secondary | ICD-10-CM | POA: Diagnosis not present

## 2014-07-12 DIAGNOSIS — I10 Essential (primary) hypertension: Secondary | ICD-10-CM | POA: Diagnosis not present

## 2014-07-12 DIAGNOSIS — M797 Fibromyalgia: Secondary | ICD-10-CM | POA: Diagnosis not present

## 2014-07-12 DIAGNOSIS — M5137 Other intervertebral disc degeneration, lumbosacral region: Secondary | ICD-10-CM | POA: Diagnosis not present

## 2014-07-12 DIAGNOSIS — Z880 Allergy status to penicillin: Secondary | ICD-10-CM | POA: Diagnosis not present

## 2014-07-12 DIAGNOSIS — Z91041 Radiographic dye allergy status: Secondary | ICD-10-CM | POA: Diagnosis not present

## 2014-07-12 DIAGNOSIS — E119 Type 2 diabetes mellitus without complications: Secondary | ICD-10-CM | POA: Diagnosis not present

## 2014-07-12 DIAGNOSIS — Z79899 Other long term (current) drug therapy: Secondary | ICD-10-CM | POA: Diagnosis not present

## 2014-07-12 DIAGNOSIS — Z981 Arthrodesis status: Secondary | ICD-10-CM | POA: Diagnosis not present

## 2014-07-12 DIAGNOSIS — E785 Hyperlipidemia, unspecified: Secondary | ICD-10-CM | POA: Diagnosis not present

## 2014-07-12 DIAGNOSIS — M479 Spondylosis, unspecified: Secondary | ICD-10-CM | POA: Diagnosis not present

## 2014-07-12 DIAGNOSIS — Z882 Allergy status to sulfonamides status: Secondary | ICD-10-CM | POA: Diagnosis not present

## 2014-07-12 DIAGNOSIS — E039 Hypothyroidism, unspecified: Secondary | ICD-10-CM | POA: Diagnosis not present

## 2014-07-18 DIAGNOSIS — G894 Chronic pain syndrome: Secondary | ICD-10-CM | POA: Diagnosis not present

## 2014-07-18 DIAGNOSIS — M961 Postlaminectomy syndrome, not elsewhere classified: Secondary | ICD-10-CM | POA: Diagnosis not present

## 2014-07-25 DIAGNOSIS — G894 Chronic pain syndrome: Secondary | ICD-10-CM | POA: Diagnosis not present

## 2014-07-25 DIAGNOSIS — Z9181 History of falling: Secondary | ICD-10-CM | POA: Diagnosis not present

## 2014-07-25 DIAGNOSIS — M47817 Spondylosis without myelopathy or radiculopathy, lumbosacral region: Secondary | ICD-10-CM | POA: Diagnosis not present

## 2014-07-25 DIAGNOSIS — M5137 Other intervertebral disc degeneration, lumbosacral region: Secondary | ICD-10-CM | POA: Diagnosis not present

## 2014-07-25 DIAGNOSIS — M1990 Unspecified osteoarthritis, unspecified site: Secondary | ICD-10-CM | POA: Diagnosis not present

## 2014-07-25 DIAGNOSIS — E119 Type 2 diabetes mellitus without complications: Secondary | ICD-10-CM | POA: Diagnosis not present

## 2014-07-25 DIAGNOSIS — M5412 Radiculopathy, cervical region: Secondary | ICD-10-CM | POA: Diagnosis not present

## 2014-07-25 DIAGNOSIS — M79609 Pain in unspecified limb: Secondary | ICD-10-CM | POA: Diagnosis not present

## 2014-07-25 DIAGNOSIS — M961 Postlaminectomy syndrome, not elsewhere classified: Secondary | ICD-10-CM | POA: Diagnosis not present

## 2014-08-01 ENCOUNTER — Other Ambulatory Visit: Payer: Self-pay | Admitting: Pain Medicine

## 2014-08-01 DIAGNOSIS — M542 Cervicalgia: Secondary | ICD-10-CM

## 2014-08-03 DIAGNOSIS — M542 Cervicalgia: Secondary | ICD-10-CM | POA: Diagnosis not present

## 2014-08-03 DIAGNOSIS — G894 Chronic pain syndrome: Secondary | ICD-10-CM | POA: Diagnosis not present

## 2014-08-03 DIAGNOSIS — M545 Low back pain: Secondary | ICD-10-CM | POA: Diagnosis not present

## 2014-08-03 DIAGNOSIS — M961 Postlaminectomy syndrome, not elsewhere classified: Secondary | ICD-10-CM | POA: Diagnosis not present

## 2014-08-04 DIAGNOSIS — E119 Type 2 diabetes mellitus without complications: Secondary | ICD-10-CM | POA: Diagnosis not present

## 2014-08-04 DIAGNOSIS — H02831 Dermatochalasis of right upper eyelid: Secondary | ICD-10-CM | POA: Diagnosis not present

## 2014-08-04 DIAGNOSIS — H47323 Drusen of optic disc, bilateral: Secondary | ICD-10-CM | POA: Diagnosis not present

## 2014-08-04 DIAGNOSIS — Z961 Presence of intraocular lens: Secondary | ICD-10-CM | POA: Diagnosis not present

## 2014-08-04 DIAGNOSIS — H02834 Dermatochalasis of left upper eyelid: Secondary | ICD-10-CM | POA: Diagnosis not present

## 2014-08-05 ENCOUNTER — Other Ambulatory Visit: Payer: PRIVATE HEALTH INSURANCE

## 2014-08-16 DIAGNOSIS — I10 Essential (primary) hypertension: Secondary | ICD-10-CM | POA: Diagnosis not present

## 2014-08-16 DIAGNOSIS — R05 Cough: Secondary | ICD-10-CM | POA: Diagnosis not present

## 2014-08-16 DIAGNOSIS — R739 Hyperglycemia, unspecified: Secondary | ICD-10-CM | POA: Diagnosis not present

## 2014-08-16 DIAGNOSIS — I700 Atherosclerosis of aorta: Secondary | ICD-10-CM | POA: Diagnosis not present

## 2014-08-16 DIAGNOSIS — E039 Hypothyroidism, unspecified: Secondary | ICD-10-CM | POA: Diagnosis not present

## 2014-08-23 DIAGNOSIS — G894 Chronic pain syndrome: Secondary | ICD-10-CM | POA: Diagnosis not present

## 2014-08-23 DIAGNOSIS — M545 Low back pain: Secondary | ICD-10-CM | POA: Diagnosis not present

## 2014-08-23 DIAGNOSIS — M961 Postlaminectomy syndrome, not elsewhere classified: Secondary | ICD-10-CM | POA: Diagnosis not present

## 2014-08-23 DIAGNOSIS — M542 Cervicalgia: Secondary | ICD-10-CM | POA: Diagnosis not present

## 2014-10-04 DIAGNOSIS — M961 Postlaminectomy syndrome, not elsewhere classified: Secondary | ICD-10-CM | POA: Diagnosis not present

## 2014-10-04 DIAGNOSIS — M545 Low back pain: Secondary | ICD-10-CM | POA: Diagnosis not present

## 2014-10-04 DIAGNOSIS — M542 Cervicalgia: Secondary | ICD-10-CM | POA: Diagnosis not present

## 2014-10-04 DIAGNOSIS — G894 Chronic pain syndrome: Secondary | ICD-10-CM | POA: Diagnosis not present

## 2014-10-05 ENCOUNTER — Other Ambulatory Visit: Payer: Self-pay | Admitting: Internal Medicine

## 2014-10-05 DIAGNOSIS — R921 Mammographic calcification found on diagnostic imaging of breast: Secondary | ICD-10-CM

## 2014-10-11 ENCOUNTER — Ambulatory Visit
Admission: RE | Admit: 2014-10-11 | Discharge: 2014-10-11 | Disposition: A | Discharge status: Home / Self Care | Payer: Medicare Other | Source: Ambulatory Visit | Attending: Internal Medicine | Admitting: Internal Medicine

## 2014-10-11 DIAGNOSIS — R921 Mammographic calcification found on diagnostic imaging of breast: Secondary | ICD-10-CM | POA: Diagnosis not present

## 2014-10-16 DIAGNOSIS — E039 Hypothyroidism, unspecified: Secondary | ICD-10-CM | POA: Diagnosis not present

## 2014-10-16 DIAGNOSIS — I10 Essential (primary) hypertension: Secondary | ICD-10-CM | POA: Diagnosis not present

## 2014-10-16 DIAGNOSIS — E559 Vitamin D deficiency, unspecified: Secondary | ICD-10-CM | POA: Diagnosis not present

## 2014-10-16 DIAGNOSIS — M1990 Unspecified osteoarthritis, unspecified site: Secondary | ICD-10-CM | POA: Diagnosis not present

## 2014-11-13 DIAGNOSIS — E119 Type 2 diabetes mellitus without complications: Secondary | ICD-10-CM | POA: Diagnosis not present

## 2014-11-13 DIAGNOSIS — E780 Pure hypercholesterolemia: Secondary | ICD-10-CM | POA: Diagnosis not present

## 2015-04-25 ENCOUNTER — Emergency Department (HOSPITAL_COMMUNITY): Payer: Medicare Other

## 2015-04-25 ENCOUNTER — Emergency Department (HOSPITAL_COMMUNITY)
Admission: EM | Admit: 2015-04-25 | Discharge: 2015-04-25 | Disposition: A | Discharge status: Home / Self Care | Payer: Medicare Other | Attending: Emergency Medicine | Admitting: Emergency Medicine

## 2015-04-25 ENCOUNTER — Encounter (HOSPITAL_COMMUNITY): Payer: Self-pay | Admitting: Emergency Medicine

## 2015-04-25 DIAGNOSIS — R109 Unspecified abdominal pain: Secondary | ICD-10-CM | POA: Diagnosis not present

## 2015-04-25 DIAGNOSIS — Z7982 Long term (current) use of aspirin: Secondary | ICD-10-CM | POA: Diagnosis not present

## 2015-04-25 DIAGNOSIS — K219 Gastro-esophageal reflux disease without esophagitis: Secondary | ICD-10-CM | POA: Insufficient documentation

## 2015-04-25 DIAGNOSIS — R079 Chest pain, unspecified: Secondary | ICD-10-CM | POA: Insufficient documentation

## 2015-04-25 DIAGNOSIS — E785 Hyperlipidemia, unspecified: Secondary | ICD-10-CM | POA: Insufficient documentation

## 2015-04-25 DIAGNOSIS — Z90710 Acquired absence of both cervix and uterus: Secondary | ICD-10-CM | POA: Diagnosis not present

## 2015-04-25 DIAGNOSIS — R0602 Shortness of breath: Secondary | ICD-10-CM | POA: Insufficient documentation

## 2015-04-25 DIAGNOSIS — R1110 Vomiting, unspecified: Secondary | ICD-10-CM | POA: Insufficient documentation

## 2015-04-25 DIAGNOSIS — Z79899 Other long term (current) drug therapy: Secondary | ICD-10-CM | POA: Diagnosis not present

## 2015-04-25 DIAGNOSIS — F329 Major depressive disorder, single episode, unspecified: Secondary | ICD-10-CM | POA: Diagnosis not present

## 2015-04-25 DIAGNOSIS — R531 Weakness: Secondary | ICD-10-CM | POA: Diagnosis present

## 2015-04-25 DIAGNOSIS — M1990 Unspecified osteoarthritis, unspecified site: Secondary | ICD-10-CM | POA: Diagnosis not present

## 2015-04-25 DIAGNOSIS — Z880 Allergy status to penicillin: Secondary | ICD-10-CM | POA: Insufficient documentation

## 2015-04-25 LAB — URINALYSIS, ROUTINE W REFLEX MICROSCOPIC
GLUCOSE, UA: NEGATIVE mg/dL
HGB URINE DIPSTICK: NEGATIVE
KETONES UR: NEGATIVE mg/dL
Nitrite: NEGATIVE
PROTEIN: NEGATIVE mg/dL
Specific Gravity, Urine: 1.038 — ABNORMAL HIGH (ref 1.005–1.030)
UROBILINOGEN UA: 0.2 mg/dL (ref 0.0–1.0)
pH: 5 (ref 5.0–8.0)

## 2015-04-25 LAB — BASIC METABOLIC PANEL
ANION GAP: 8 (ref 5–15)
BUN: 13 mg/dL (ref 6–20)
CALCIUM: 9.4 mg/dL (ref 8.9–10.3)
CO2: 24 mmol/L (ref 22–32)
CREATININE: 0.65 mg/dL (ref 0.44–1.00)
Chloride: 104 mmol/L (ref 101–111)
GLUCOSE: 114 mg/dL — AB (ref 65–99)
Potassium: 3.9 mmol/L (ref 3.5–5.1)
Sodium: 136 mmol/L (ref 135–145)

## 2015-04-25 LAB — CBC
HCT: 42.5 % (ref 36.0–46.0)
HEMOGLOBIN: 14 g/dL (ref 12.0–15.0)
MCH: 30.1 pg (ref 26.0–34.0)
MCHC: 32.9 g/dL (ref 30.0–36.0)
MCV: 91.4 fL (ref 78.0–100.0)
PLATELETS: 231 10*3/uL (ref 150–400)
RBC: 4.65 MIL/uL (ref 3.87–5.11)
RDW: 13.2 % (ref 11.5–15.5)
WBC: 11.2 10*3/uL — ABNORMAL HIGH (ref 4.0–10.5)

## 2015-04-25 LAB — URINE MICROSCOPIC-ADD ON

## 2015-04-25 LAB — LIPASE, BLOOD: LIPASE: 21 U/L — AB (ref 22–51)

## 2015-04-25 LAB — I-STAT TROPONIN, ED: Troponin i, poc: 0 ng/mL (ref 0.00–0.08)

## 2015-04-25 LAB — BRAIN NATRIURETIC PEPTIDE: B Natriuretic Peptide: 28.8 pg/mL (ref 0.0–100.0)

## 2015-04-25 MED ORDER — LORAZEPAM 2 MG/ML IJ SOLN
INTRAMUSCULAR | Status: AC
  Filled 2015-04-25: qty 1

## 2015-04-25 NOTE — ED Notes (Signed)
EKG shown to Dr. Goldston  

## 2015-04-25 NOTE — ED Notes (Signed)
Per pt, states she has been weak for 2 weeks-states she is SOB upon exertion-states chest pain on and off

## 2015-04-25 NOTE — ED Provider Notes (Signed)
CSN: [id number]     Arrival date & time 04/25/15  1127 History   First MD Initiated Contact with Patient 04/25/15 1149     Chief Complaint  Patient presents with  . Weakness     (Consider location/radiation/quality/duration/timing/severity/associated sxs/prior Treatment) HPI Comments: 69 year old female here with 2 weeks of shortness of breath, vomiting, chest pain. Shortness of breath worse with ambulation. Mild cough is nonproductive. Chest pain is tightness, central, nonradiating.  Patient is a 69 y.o. female presenting with weakness. The history is provided by the patient.  Weakness This is a new problem. The current episode started more than 1 week ago. The problem occurs constantly. The problem has not changed since onset.Associated symptoms include chest pain, abdominal pain and shortness of breath. Nothing aggravates the symptoms. Nothing relieves the symptoms.    Past Medical History  Diagnosis Date  . DJD (degenerative joint disease)   . Bursitis   . Hyperlipemia   . Depression   . GERD (gastroesophageal reflux disease)   . Obesities, morbid   . Urinary incontinence    Past Surgical History  Procedure Laterality Date  . Neck surgery    . Cardiac catheterization    . Total knee arthroplasty      x2  . Vaginal hysterectomy     Family History  Problem Relation Age of Onset  . Lung disease    . Heart attack     Social History  Substance Use Topics  . Smoking status: Never Smoker   . Smokeless tobacco: None  . Alcohol Use: No   OB History    No data available     Review of Systems  Constitutional: Negative for fever.  Respiratory: Positive for shortness of breath. Negative for cough.   Cardiovascular: Positive for chest pain. Negative for leg swelling.  Gastrointestinal: Positive for vomiting (every morning) and abdominal pain.  Neurological: Positive for weakness.  All other systems reviewed and are negative.     Allergies  Contrast media;  Penicillins; Sulfa antibiotics; and Morphine and related  Home Medications   Prior to Admission medications   Medication Sig Start Date End Date Taking? Authorizing Nillie Bartolotta  aspirin 81 MG EC tablet Take 81 mg by mouth daily.     Yes Historical Mazell Aylesworth, MD  atorvastatin (LIPITOR) 10 MG tablet Take 10 mg by mouth daily.   Yes Historical Cortana Vanderford, MD  Cholecalciferol (VITAMIN D3) 2000 UNITS TABS Take 2,000 Units by mouth daily.    Yes Historical Josseline Reddin, MD  clonazePAM (KLONOPIN) 0.5 MG tablet Take 0.5 mg by mouth 2 (two) times daily as needed for anxiety.   Yes Historical Maaz Spiering, MD  hydrochlorothiazide (,MICROZIDE/HYDRODIURIL,) 12.5 MG capsule Take 12.5 mg by mouth daily.     Yes Historical Edenilson Austad, MD  HYDROcodone-acetaminophen (NORCO) 10-325 MG per tablet Take 1-2 tablets by mouth every 4 (four) hours as needed for moderate pain.    Yes Historical Rosie Golson, MD  ibuprofen (ADVIL,MOTRIN) 600 MG tablet Take 600 mg by mouth every 8 (eight) hours as needed for pain.   Yes Historical Ramzy Cappelletti, MD  levothyroxine (SYNTHROID, LEVOTHROID) 50 MCG tablet Take 50 mcg by mouth daily.     Yes Historical Fabiha Rougeau, MD  quinapril (ACCUPRIL) 40 MG tablet Take 40 mg by mouth at bedtime.     Yes Historical Trampas Stettner, MD  sertraline (ZOLOFT) 100 MG tablet Take 200 mg by mouth daily.    Yes Historical Khalia Gong, MD   BP 146/93 mmHg  Pulse 85  Temp(Src) 98.4 F (36.9  C) (Oral)  Resp 18  SpO2 94% Physical Exam  Constitutional: She is oriented to person, place, and time. She appears well-developed and well-nourished. No distress.  HENT:  Head: Normocephalic and atraumatic.  Mouth/Throat: Oropharynx is clear and moist.  Eyes: EOM are normal. Pupils are equal, round, and reactive to light.  Neck: Normal range of motion. Neck supple.  Cardiovascular: Normal rate and regular rhythm.  Exam reveals no friction rub.   No murmur heard. Pulmonary/Chest: Effort normal and breath sounds normal. No respiratory  distress. She has no wheezes. She has no rales.  Abdominal: Soft. She exhibits no distension. There is no tenderness. There is no rebound.  Musculoskeletal: Normal range of motion. She exhibits no edema.  Neurological: She is alert and oriented to person, place, and time.  Skin: No rash noted. She is not diaphoretic.  Nursing note and vitals reviewed.   ED Course  Procedures (including critical care time) Labs Review Labs Reviewed  CBC - Abnormal; Notable for the following:    WBC 11.2 (*)    All other components within normal limits  BASIC METABOLIC PANEL  URINALYSIS, ROUTINE W REFLEX MICROSCOPIC (NOT AT [facility])  LIPASE, BLOOD  BRAIN NATRIURETIC PEPTIDE  I-STAT TROPOININ, ED    Imaging Review Dg Chest 2 View  04/25/2015   CLINICAL DATA:  ongoing intermittent chest pain for 6 months  EXAM: CHEST  2 VIEW  COMPARISON:  Radiograph 08/16/2014  FINDINGS: Anterior cervical fixation noted. Stimulation electrodes in the lower thoracic spine central canal. Normal cardiac silhouette with ectatic aorta. Central venous pulmonary pulmonary congestion is present. No pulmonary edema. Small RIGHT effusion.  IMPRESSION: Central venous congestion small RIGHT effusion.   Electronically Signed   By: Suzy Bouchard M.D.   On: 04/25/2015 13:00   I have personally reviewed and evaluated these images and lab results as part of my medical decision-making.   EKG Interpretation   Date/Time:  Wednesday April 25 2015 11:40:04 EDT Ventricular Rate:  87 PR Interval:  141 QRS Duration: 89 QT Interval:  367 QTC Calculation: 441 R Axis:   -34 Text Interpretation:  Sinus rhythm Left axis deviation Low voltage,  precordial leads Abnormal R-wave progression, late transition No  significant change since last tracing Confirmed by Mingo Amber  MD, Jordan  ([id number]) on 04/25/2015 12:38:40 PM      MDM   Final diagnoses:  Shortness of breath    69 year old female with history of obesity presents with 2 weeks of  multiple complaints. She's been vomiting every day, having dyspnea on exertion, having central chest pain, and having shortness of breath when not had exertion. No fever. States mild cough. No diarrhea. She has some belly pain also. Here lungs are clear. She has no peripheral edema or JVD. Abdominal exam is normal without focal pain, peritonitis, guarding, masses. We'll check labs, chest x-ray, urinalysis.  Labs all normal. Offered a CT scan because she complained about some intermittent belly pain but she refused. Instructed to follow-up with PCP. No concern for heart failure, pneumonia. Chest x-ray shows small right pleural effusion which could have multiple etiologies. I do not feel she needs to get admitted with stable vitals and normal labs.  She is amenable to this plan. Stable for discharge.  Evelina Bucy, MD 04/25/15 [phone]

## 2015-04-25 NOTE — Discharge Instructions (Signed)

## 2015-04-25 NOTE — ED Notes (Signed)
Patient transported to X-ray 

## 2015-04-30 ENCOUNTER — Other Ambulatory Visit: Payer: Self-pay | Admitting: Internal Medicine

## 2015-04-30 DIAGNOSIS — R5383 Other fatigue: Secondary | ICD-10-CM | POA: Diagnosis not present

## 2015-04-30 DIAGNOSIS — R0600 Dyspnea, unspecified: Secondary | ICD-10-CM

## 2015-04-30 DIAGNOSIS — J90 Pleural effusion, not elsewhere classified: Secondary | ICD-10-CM | POA: Diagnosis not present

## 2015-04-30 DIAGNOSIS — R739 Hyperglycemia, unspecified: Secondary | ICD-10-CM | POA: Diagnosis not present

## 2015-05-02 ENCOUNTER — Ambulatory Visit
Admission: RE | Admit: 2015-05-02 | Discharge: 2015-05-02 | Disposition: A | Discharge status: Home / Self Care | Payer: Medicare Other | Source: Ambulatory Visit | Attending: Internal Medicine | Admitting: Internal Medicine

## 2015-05-02 DIAGNOSIS — R0600 Dyspnea, unspecified: Secondary | ICD-10-CM

## 2015-05-02 DIAGNOSIS — R0602 Shortness of breath: Secondary | ICD-10-CM | POA: Diagnosis not present

## 2015-05-02 DIAGNOSIS — R0609 Other forms of dyspnea: Secondary | ICD-10-CM | POA: Diagnosis not present

## 2015-05-09 ENCOUNTER — Emergency Department (HOSPITAL_COMMUNITY): Payer: Medicare Other

## 2015-05-09 ENCOUNTER — Encounter (HOSPITAL_COMMUNITY): Payer: Self-pay | Admitting: Emergency Medicine

## 2015-05-09 ENCOUNTER — Inpatient Hospital Stay (HOSPITAL_COMMUNITY)
Admission: EM | Admit: 2015-05-09 | Discharge: 2015-05-17 | DRG: 417 | Disposition: A | Discharge status: Home Health | Payer: Medicare Other | Attending: Internal Medicine | Admitting: Internal Medicine

## 2015-05-09 DIAGNOSIS — I480 Paroxysmal atrial fibrillation: Secondary | ICD-10-CM | POA: Diagnosis not present

## 2015-05-09 DIAGNOSIS — Z23 Encounter for immunization: Secondary | ICD-10-CM

## 2015-05-09 DIAGNOSIS — E039 Hypothyroidism, unspecified: Secondary | ICD-10-CM | POA: Diagnosis present

## 2015-05-09 DIAGNOSIS — K8000 Calculus of gallbladder with acute cholecystitis without obstruction: Secondary | ICD-10-CM | POA: Diagnosis not present

## 2015-05-09 DIAGNOSIS — J9811 Atelectasis: Secondary | ICD-10-CM | POA: Diagnosis not present

## 2015-05-09 DIAGNOSIS — R1011 Right upper quadrant pain: Secondary | ICD-10-CM | POA: Diagnosis not present

## 2015-05-09 DIAGNOSIS — Z96653 Presence of artificial knee joint, bilateral: Secondary | ICD-10-CM | POA: Diagnosis present

## 2015-05-09 DIAGNOSIS — E871 Hypo-osmolality and hyponatremia: Secondary | ICD-10-CM | POA: Diagnosis present

## 2015-05-09 DIAGNOSIS — I2510 Atherosclerotic heart disease of native coronary artery without angina pectoris: Secondary | ICD-10-CM | POA: Diagnosis not present

## 2015-05-09 DIAGNOSIS — E1159 Type 2 diabetes mellitus with other circulatory complications: Secondary | ICD-10-CM

## 2015-05-09 DIAGNOSIS — I10 Essential (primary) hypertension: Secondary | ICD-10-CM | POA: Diagnosis not present

## 2015-05-09 DIAGNOSIS — Z885 Allergy status to narcotic agent status: Secondary | ICD-10-CM

## 2015-05-09 DIAGNOSIS — Z791 Long term (current) use of non-steroidal anti-inflammatories (NSAID): Secondary | ICD-10-CM

## 2015-05-09 DIAGNOSIS — Z79899 Other long term (current) drug therapy: Secondary | ICD-10-CM

## 2015-05-09 DIAGNOSIS — R059 Cough, unspecified: Secondary | ICD-10-CM

## 2015-05-09 DIAGNOSIS — E785 Hyperlipidemia, unspecified: Secondary | ICD-10-CM | POA: Diagnosis present

## 2015-05-09 DIAGNOSIS — R0902 Hypoxemia: Secondary | ICD-10-CM

## 2015-05-09 DIAGNOSIS — I4891 Unspecified atrial fibrillation: Secondary | ICD-10-CM

## 2015-05-09 DIAGNOSIS — Z7982 Long term (current) use of aspirin: Secondary | ICD-10-CM

## 2015-05-09 DIAGNOSIS — J9601 Acute respiratory failure with hypoxia: Secondary | ICD-10-CM | POA: Diagnosis not present

## 2015-05-09 DIAGNOSIS — E876 Hypokalemia: Secondary | ICD-10-CM | POA: Diagnosis not present

## 2015-05-09 DIAGNOSIS — R0602 Shortness of breath: Secondary | ICD-10-CM

## 2015-05-09 DIAGNOSIS — Z6841 Body Mass Index (BMI) 40.0 and over, adult: Secondary | ICD-10-CM | POA: Diagnosis not present

## 2015-05-09 DIAGNOSIS — K8020 Calculus of gallbladder without cholecystitis without obstruction: Secondary | ICD-10-CM | POA: Diagnosis not present

## 2015-05-09 DIAGNOSIS — F329 Major depressive disorder, single episode, unspecified: Secondary | ICD-10-CM | POA: Diagnosis not present

## 2015-05-09 DIAGNOSIS — K810 Acute cholecystitis: Secondary | ICD-10-CM | POA: Diagnosis not present

## 2015-05-09 DIAGNOSIS — G8929 Other chronic pain: Secondary | ICD-10-CM | POA: Diagnosis not present

## 2015-05-09 DIAGNOSIS — R1013 Epigastric pain: Secondary | ICD-10-CM | POA: Diagnosis not present

## 2015-05-09 DIAGNOSIS — Z880 Allergy status to penicillin: Secondary | ICD-10-CM

## 2015-05-09 DIAGNOSIS — I110 Hypertensive heart disease with heart failure: Secondary | ICD-10-CM | POA: Diagnosis present

## 2015-05-09 DIAGNOSIS — I5032 Chronic diastolic (congestive) heart failure: Secondary | ICD-10-CM | POA: Diagnosis present

## 2015-05-09 DIAGNOSIS — R112 Nausea with vomiting, unspecified: Secondary | ICD-10-CM | POA: Diagnosis not present

## 2015-05-09 DIAGNOSIS — R072 Precordial pain: Secondary | ICD-10-CM | POA: Insufficient documentation

## 2015-05-09 DIAGNOSIS — Z91041 Radiographic dye allergy status: Secondary | ICD-10-CM

## 2015-05-09 DIAGNOSIS — Z9049 Acquired absence of other specified parts of digestive tract: Secondary | ICD-10-CM | POA: Insufficient documentation

## 2015-05-09 DIAGNOSIS — M6281 Muscle weakness (generalized): Secondary | ICD-10-CM | POA: Diagnosis present

## 2015-05-09 DIAGNOSIS — Z79891 Long term (current) use of opiate analgesic: Secondary | ICD-10-CM

## 2015-05-09 DIAGNOSIS — M549 Dorsalgia, unspecified: Secondary | ICD-10-CM | POA: Diagnosis not present

## 2015-05-09 DIAGNOSIS — K219 Gastro-esophageal reflux disease without esophagitis: Secondary | ICD-10-CM | POA: Diagnosis present

## 2015-05-09 DIAGNOSIS — Z881 Allergy status to other antibiotic agents status: Secondary | ICD-10-CM

## 2015-05-09 DIAGNOSIS — I152 Hypertension secondary to endocrine disorders: Secondary | ICD-10-CM

## 2015-05-09 DIAGNOSIS — R05 Cough: Secondary | ICD-10-CM

## 2015-05-09 LAB — CBC
HCT: 41.6 % (ref 36.0–46.0)
Hemoglobin: 13.9 g/dL (ref 12.0–15.0)
MCH: 30.3 pg (ref 26.0–34.0)
MCHC: 33.4 g/dL (ref 30.0–36.0)
MCV: 90.6 fL (ref 78.0–100.0)
Platelets: 212 10*3/uL (ref 150–400)
RBC: 4.59 MIL/uL (ref 3.87–5.11)
RDW: 12.9 % (ref 11.5–15.5)
WBC: 9.7 10*3/uL (ref 4.0–10.5)

## 2015-05-09 LAB — COMPREHENSIVE METABOLIC PANEL
ALBUMIN: 3.9 g/dL (ref 3.5–5.0)
ALK PHOS: 85 U/L (ref 38–126)
ALT: 17 U/L (ref 14–54)
ANION GAP: 8 (ref 5–15)
AST: 20 U/L (ref 15–41)
BILIRUBIN TOTAL: 0.7 mg/dL (ref 0.3–1.2)
BUN: 14 mg/dL (ref 6–20)
CALCIUM: 9.4 mg/dL (ref 8.9–10.3)
CO2: 26 mmol/L (ref 22–32)
Chloride: 104 mmol/L (ref 101–111)
Creatinine, Ser: 0.55 mg/dL (ref 0.44–1.00)
GFR calc Af Amer: >60 mL/min (ref >60)
Glucose, Bld: 131 mg/dL — ABNORMAL HIGH (ref 65–99)
Potassium: 3.8 mmol/L (ref 3.5–5.1)
Sodium: 138 mmol/L (ref 135–145)
TOTAL PROTEIN: 7.1 g/dL (ref 6.5–8.1)

## 2015-05-09 LAB — URINALYSIS, ROUTINE W REFLEX MICROSCOPIC
Glucose, UA: NEGATIVE mg/dL
Ketones, ur: NEGATIVE mg/dL
Nitrite: NEGATIVE
PH: 6 (ref 5.0–8.0)
Protein, ur: NEGATIVE mg/dL
SPECIFIC GRAVITY, URINE: 1.03 (ref 1.005–1.030)
UROBILINOGEN UA: 0.2 mg/dL (ref 0.0–1.0)

## 2015-05-09 LAB — URINE MICROSCOPIC-ADD ON

## 2015-05-09 LAB — I-STAT TROPONIN, ED: Troponin i, poc: 0 ng/mL (ref 0.00–0.08)

## 2015-05-09 LAB — LIPASE, BLOOD: Lipase: 23 U/L (ref 22–51)

## 2015-05-09 MED ORDER — ONDANSETRON HCL 4 MG/2ML IJ SOLN
4.0000 mg | Freq: Four times a day (QID) | INTRAMUSCULAR | Status: DC | PRN
  Administered 2015-05-10 – 2015-05-14 (×4): 4 mg via INTRAVENOUS
  Filled 2015-05-09 (×6): qty 2

## 2015-05-09 MED ORDER — ATORVASTATIN CALCIUM 10 MG PO TABS
10.0000 mg | ORAL_TABLET | Freq: Every day | ORAL | Status: DC
  Administered 2015-05-10 – 2015-05-17 (×8): 10 mg via ORAL
  Filled 2015-05-09 (×8): qty 1

## 2015-05-09 MED ORDER — LISINOPRIL 40 MG PO TABS
40.0000 mg | ORAL_TABLET | Freq: Every day | ORAL | Status: DC
  Administered 2015-05-10 – 2015-05-13 (×4): 40 mg via ORAL
  Filled 2015-05-09 (×4): qty 1

## 2015-05-09 MED ORDER — ONDANSETRON HCL 4 MG PO TABS
4.0000 mg | ORAL_TABLET | Freq: Four times a day (QID) | ORAL | Status: DC | PRN
  Administered 2015-05-14 – 2015-05-16 (×3): 4 mg via ORAL
  Filled 2015-05-09 (×3): qty 1

## 2015-05-09 MED ORDER — ASPIRIN EC 81 MG PO TBEC
81.0000 mg | DELAYED_RELEASE_TABLET | Freq: Every day | ORAL | Status: DC
  Administered 2015-05-10 – 2015-05-17 (×6): 81 mg via ORAL
  Filled 2015-05-09 (×7): qty 1

## 2015-05-09 MED ORDER — DEXTROSE-NACL 5-0.45 % IV SOLN
INTRAVENOUS | Status: DC
  Administered 2015-05-09 – 2015-05-10 (×2): via INTRAVENOUS

## 2015-05-09 MED ORDER — POLYETHYLENE GLYCOL 3350 17 G PO PACK: 17.0000 g | PACK | Freq: Every day | ORAL | Status: DC | PRN

## 2015-05-09 MED ORDER — ONDANSETRON HCL 4 MG/2ML IJ SOLN
4.0000 mg | Freq: Once | INTRAMUSCULAR | Status: AC
  Administered 2015-05-09: 4 mg via INTRAVENOUS
  Filled 2015-05-09: qty 2

## 2015-05-09 MED ORDER — INFLUENZA VAC SPLIT QUAD 0.5 ML IM SUSY
0.5000 mL | PREFILLED_SYRINGE | INTRAMUSCULAR | Status: AC | PRN
  Administered 2015-05-17: 0.5 mL via INTRAMUSCULAR
  Filled 2015-05-09: qty 0.5

## 2015-05-09 MED ORDER — PANTOPRAZOLE SODIUM 40 MG IV SOLR
40.0000 mg | Freq: Two times a day (BID) | INTRAVENOUS | Status: DC
  Administered 2015-05-09 – 2015-05-16 (×14): 40 mg via INTRAVENOUS
  Filled 2015-05-09 (×15): qty 40

## 2015-05-09 MED ORDER — ACETAMINOPHEN 500 MG PO TABS
1000.0000 mg | ORAL_TABLET | Freq: Once | ORAL | Status: AC
  Administered 2015-05-09: 1000 mg via ORAL
  Filled 2015-05-09: qty 2

## 2015-05-09 MED ORDER — SODIUM CHLORIDE 0.9 % IV BOLUS (SEPSIS)
1000.0000 mL | Freq: Once | INTRAVENOUS | Status: AC
  Administered 2015-05-09: 1000 mL via INTRAVENOUS

## 2015-05-09 MED ORDER — HYDROCODONE-ACETAMINOPHEN 10-325 MG PO TABS
1.0000 | ORAL_TABLET | ORAL | Status: DC | PRN
  Administered 2015-05-09 – 2015-05-10 (×2): 2 via ORAL
  Administered 2015-05-10: 1 via ORAL
  Administered 2015-05-11: 2 via ORAL
  Administered 2015-05-11: 1 via ORAL
  Administered 2015-05-12 – 2015-05-15 (×6): 2 via ORAL
  Administered 2015-05-16: 1 via ORAL
  Administered 2015-05-16: 2 via ORAL
  Administered 2015-05-16: 1 via ORAL
  Filled 2015-05-09 (×6): qty 2
  Filled 2015-05-09 (×2): qty 1
  Filled 2015-05-09 (×2): qty 2
  Filled 2015-05-09 (×2): qty 1
  Filled 2015-05-09 (×4): qty 2

## 2015-05-09 MED ORDER — HYDROMORPHONE HCL 1 MG/ML IJ SOLN
0.5000 mg | INTRAMUSCULAR | Status: DC | PRN
  Administered 2015-05-10 – 2015-05-11 (×8): 0.5 mg via INTRAVENOUS
  Filled 2015-05-09 (×8): qty 1

## 2015-05-09 MED ORDER — QUINAPRIL HCL 10 MG PO TABS: 40.0000 mg | ORAL_TABLET | Freq: Every day | ORAL | Status: DC

## 2015-05-09 MED ORDER — HYDROCHLOROTHIAZIDE 12.5 MG PO CAPS
12.5000 mg | ORAL_CAPSULE | Freq: Every day | ORAL | Status: DC
  Administered 2015-05-10 – 2015-05-11 (×2): 12.5 mg via ORAL
  Filled 2015-05-09 (×2): qty 1

## 2015-05-09 MED ORDER — HEPARIN SODIUM (PORCINE) 5000 UNIT/ML IJ SOLN
5000.0000 [IU] | Freq: Three times a day (TID) | INTRAMUSCULAR | Status: DC
  Administered 2015-05-09 – 2015-05-16 (×20): 5000 [IU] via SUBCUTANEOUS
  Filled 2015-05-09 (×22): qty 1

## 2015-05-09 MED ORDER — HYDROMORPHONE HCL 1 MG/ML IJ SOLN
1.0000 mg | Freq: Once | INTRAMUSCULAR | Status: AC
  Administered 2015-05-09: 1 mg via INTRAVENOUS
  Filled 2015-05-09: qty 1

## 2015-05-09 MED ORDER — LEVOTHYROXINE SODIUM 50 MCG PO TABS
50.0000 ug | ORAL_TABLET | Freq: Every day | ORAL | Status: DC
  Administered 2015-05-10 – 2015-05-17 (×8): 50 ug via ORAL
  Filled 2015-05-09 (×9): qty 1

## 2015-05-09 MED ORDER — CLONAZEPAM 0.5 MG PO TABS
0.5000 mg | ORAL_TABLET | Freq: Two times a day (BID) | ORAL | Status: DC | PRN
Start: 2015-05-09 — End: 2015-05-17
  Administered 2015-05-15: 0.5 mg via ORAL
  Filled 2015-05-09: qty 1

## 2015-05-09 MED ORDER — CETYLPYRIDINIUM CHLORIDE 0.05 % MT LIQD
7.0000 mL | Freq: Two times a day (BID) | OROMUCOSAL | Status: DC
  Administered 2015-05-09 – 2015-05-17 (×12): 7 mL via OROMUCOSAL

## 2015-05-09 MED ORDER — SERTRALINE HCL 100 MG PO TABS
200.0000 mg | ORAL_TABLET | Freq: Every day | ORAL | Status: DC
  Administered 2015-05-10 – 2015-05-17 (×8): 200 mg via ORAL
  Filled 2015-05-09 (×8): qty 2

## 2015-05-09 NOTE — ED Notes (Signed)
Patient transported to X-ray 

## 2015-05-09 NOTE — ED Notes (Signed)
MD at bedside. EDP Mingo Amber

## 2015-05-09 NOTE — H&P (Signed)
[facility] History and Physical     History and Physical:    Shirley Leblanc   PPJ:[id number] DOB: 1945-10-25 DOA: 05/09/2015  Referring MD/provider: Dr. Mingo Amber PCP: [name], MD   Chief Complaint: abd pain  History of Present Illness:   Shirley Leblanc is an 69 y.o. female obese morbid, essential hypertension also with a history of heard the comes into the hospital for epigastric pain and right upper quadrant pain, she relates she stopped taking ibuprofen about 3-4 weeks when her PCP change her to [facility] as she was having abdominal pain since then she's been having epigastric and right upper quadrant pain and vomiting once a day for the last 2 weeks, she relates her pain has been on and off with nausea and some vomiting she relates no blood, she relates she's had a fever but she hasn't checked her temperature, she also relates pain in her genital area but no dysuria. She's been having regular bowel movements but no diarrhea or constipation. She relates her water doesn't make it worse she relates she did go out to eat in a restaurant and did not vomit.  In the ED Basic metabolic panel was done with a CBC that was within normal limits her blood glucose was 131 for set of cardiac biomarkers was negative, her UA showed no white blood cells and a few bacteria . ED a CT scan of the abdomen and pelvis was done gallbladder stone and abdominal ultrasound was done that showed choledocholithiasis but no acute cholecystitis signs. She has no elevation in her LFTs.  ROS:   ROS  Constitutional: No fever, no chills;  Appetite normal; No weight loss, no weight gain, no fatigue.   HEENT: No blurry vision, no diplopia, no pharyngitis, no dysphagia  CV: No chest pain, no palpitations, no PND, no orthopnea, no edema.   Resp: No SOB, no cough, no pleuritic pain.  GI:  no diarrhea, no melena, no hematochezia, no constipation, no abdominal pain.   GU: No dysuria, no hematuria, no frequency, no urgency.   MSK: No myalgias, no arthralgias.   Neuro:  No headache, no focal neurological deficits, no history of seizures.   Psych: No depression, no anxiety.   Endo: No heat intolerance, no cold intolerance, no polyuria, no polydipsia   Skin: No rashes, no skin lesions.   Heme: No easy bruising.   Travel history: No recent travel.   Past Medical History:   Past Medical History  Diagnosis Date  . DJD (degenerative joint disease)   . Bursitis   . Hyperlipemia   . Depression   . GERD (gastroesophageal reflux disease)   . Obesities, morbid ([place])   . Urinary incontinence     Past Surgical History:   Past Surgical History  Procedure Laterality Date  . Neck surgery    . Cardiac catheterization    . Total knee arthroplasty      x2  . Vaginal hysterectomy      Social History:   Social History   Social History  . Marital Status: Widowed    Spouse Name: N/A  . Number of Children: 4  . Years of Education: N/A   Occupational History  . RETIRED    Social History Main Topics  . Smoking status: Never Smoker   . Smokeless tobacco: Not on file  . Alcohol Use: No  . Drug Use: No  . Sexual Activity: No   Other Topics Concern  . Not on file   Social  History Narrative    Family history:   Family History  Problem Relation Age of Onset  . Lung disease    . Heart attack    . Lung disease Mother   . Heart attack Father     Allergies   Contrast media; Penicillins; Sulfa antibiotics; and Morphine and related  Current Medications:   Prior to Admission medications   Medication Sig Start Date End Date Taking? Authorizing Provider  aspirin 81 MG EC tablet Take 81 mg by mouth daily.     Yes Historical Provider, MD  atorvastatin (LIPITOR) 10 MG tablet Take 10 mg by mouth daily.   Yes Historical Provider, MD  Cholecalciferol (VITAMIN D3) 2000 UNITS TABS Take 2,000 Units by mouth daily.    Yes Historical Provider, MD  clonazePAM (KLONOPIN) 0.5 MG tablet Take 0.5 mg by mouth 2  (two) times daily as needed for anxiety.   Yes Historical Provider, MD  hydrochlorothiazide (,MICROZIDE/HYDRODIURIL,) 12.5 MG capsule Take 12.5 mg by mouth daily.     Yes Historical Provider, MD  HYDROcodone-acetaminophen (NORCO) 10-325 MG per tablet Take 1-2 tablets by mouth every 4 (four) hours as needed for moderate pain.    Yes Historical Provider, MD  levothyroxine (SYNTHROID, LEVOTHROID) 50 MCG tablet Take 50 mcg by mouth daily.     Yes Historical Provider, MD  quinapril (ACCUPRIL) 40 MG tablet Take 40 mg by mouth daily.    Yes Historical Provider, MD  sertraline (ZOLOFT) 100 MG tablet Take 200 mg by mouth daily.    Yes Historical Provider, MD    Physical Exam:   Filed Vitals:   05/09/15 1135 05/09/15 1328 05/09/15 1523 05/09/15 1533  BP: 108/65 116/52  134/70  Pulse: 85 89 89 83  Temp:    99.5 F (37.5 C)  TempSrc:    Rectal  Resp: 18 22 15 14   Height:      Weight:      SpO2: 94% 98% 88% 94%     Physical Exam: Blood pressure 134/70, pulse 83, temperature 99.5 F (37.5 C), temperature source Rectal, resp. rate 14, height 5\' 6"  (1.676 m), weight 118.842 kg (262 lb), SpO2 94 %. Gen: No acute distress. Head: Normocephalic, atraumatic. Eyes: PERRL, EOMI, sclerae nonicteric. Mouth: Oropharynx Neck: Supple, no thyromegaly, no lymphadenopathy, no jugular venous distention. Chest: Good air movement clear to auscultation CV: Regular rate and rhythm Abdomen: Soft, with epigastric tenderness Murphy sign negative no rebound or guarding some mild tenderness in the right upper quadrant Extremities: Extremities Skin: Warm and dry. Neuro: Alert and oriented times 3; cranial nerves II through XII grossly intact. Psych: Mood and affect normal.   Data Review:    Labs: Basic Metabolic Panel:  Recent Labs Lab 05/09/15 1047  NA 138  K 3.8  CL 104  CO2 26  GLUCOSE 131*  BUN 14  CREATININE 0.55  CALCIUM 9.4   Liver Function Tests:  Recent Labs Lab 05/09/15 1047  AST 20    ALT 17  ALKPHOS 85  BILITOT 0.7  PROT 7.1  ALBUMIN 3.9    Recent Labs Lab 05/09/15 1047  LIPASE 23   No results for input(s): AMMONIA in the last 168 hours. CBC:  Recent Labs Lab 05/09/15 1047  WBC 9.7  HGB 13.9  HCT 41.6  MCV 90.6  PLT 212   Cardiac Enzymes: No results for input(s): CKTOTAL, CKMB, CKMBINDEX, TROPONINI in the last 168 hours.  BNP (last 3 results) No results for input(s): PROBNP in the last 8760  hours. CBG: No results for input(s): GLUCAP in the last 168 hours.  Radiographic Studies: Ct Abdomen Pelvis Wo Contrast  05/09/2015  CLINICAL DATA:  Right side abdominal pain, nausea, vomiting for 2 weeks. Gallstones. EXAM: CT ABDOMEN AND PELVIS WITHOUT CONTRAST TECHNIQUE: Multidetector CT imaging of the abdomen and pelvis was performed following the standard protocol without IV contrast. COMPARISON:  Ultrasound 05/09/2015 FINDINGS: Lung bases are clear.  No effusions.  Heart is normal size. Mild elevation of the right hemidiaphragm. Mild fatty infiltration of the liver without focal abnormality. Small gallstone noted layering within the gallbladder. Spleen, pancreas, adrenals and kidneys have an unremarkable unenhanced appearance. No renal or ureteral stones. No hydronephrosis. 14 mm calcified splenic artery aneurysm in the left upper quadrant. Small duodenal diverticulum off the second portion of the duodenum. Stomach and small bowel are decompressed. Appendix not visualized. Large bowel unremarkable. No free fluid, free air or adenopathy. Prior hysterectomy. No adnexal masses. Urinary bladder is decompressed, grossly unremarkable. Aortic and iliac calcifications without aneurysm. No acute bony abnormality or focal bone lesion. Degenerative disc and facet disease throughout the lumbar spine. Mild levoscoliosis. IMPRESSION: No evidence of renal or ureteral stones.  No hydronephrosis. No acute findings in the abdomen or pelvis. Fatty liver. For 2 mm calcified splenic  artery aneurysm. Electronically Signed   By: Rolm Baptise M.D.   On: 05/09/2015 12:29   Dg Chest 2 View  05/09/2015  CLINICAL DATA:  Shortness of breath and chest pain for 3 month EXAM: CHEST  2 VIEW COMPARISON:  Chest radiograph April 25, 2015; chest CT May 02, 2015 FINDINGS: There is mild scarring in the left lower lobe. Lungs elsewhere clear. Heart size and pulmonary vascularity are normal. No adenopathy. No pneumothorax. There is a thoracic stimulator with the tip posteriorly at the mid thoracic level. There is postoperative change in the lower cervical spine. There is degenerative change in the thoracic spine. IMPRESSION: No edema or consolidation.  Slight scarring left lower lobe. Electronically Signed   By: Lowella Grip III M.D.   On: 05/09/2015 15:53   US Abdomen Limited Ruq  05/09/2015  CLINICAL DATA:  Vomiting for 1 month.  Hypertension. EXAM: US ABDOMEN LIMITED - RIGHT UPPER QUADRANT COMPARISON:  11/15/2010 FINDINGS: Gallbladder: Echogenic mobile 1 cm gallstone in the gallbladder. No gallbladder wall thickening or sonographic Murphy sign. No pericholecystic fluid. Common bile duct: Diameter: 4 mm, within normal limits. Liver: Coarse echogenic liver with poor sonic penetration compatible with diffuse hepatic steatosis. IMPRESSION: 1. Cholelithiasis. Sonographic Murphy's sign absent; no gallbladder wall thickening. 2. Coarse echogenic liver with poor sonic penetration compatible with diffuse hepatic steatosis. Electronically Signed   By: Van Clines M.D.   On: 05/09/2015 10:44   *I have personally reviewed the images above*  EKG: Independently reviewed. Sinus rhythm with interventricular block nonspecific T-wave changes   Assessment/Plan:   RUQ pain Her CT scan does not show any acute cholecystitis, abdominal ultrasound was also done that did not show any acute cholecystitis is some gallbladder stones, her Percell Miller sign is negative her LFTs are within normal limits she  relates her symptoms are on and off on constant. She doesn't have a white count and has remained afebrile here in the hospital. She relates she had been taking ibuprofen until a re-weeks ago when she was changed to Norco due to her severe epigastric pain. She's had a history of GERD in the past and has taken Protonix in the past. I will go ahead and admitted to  the MedSurg unit start her on a clear liquid diet started on Protonix IV twice a day and reevaluate her in the morning. Surgery has already been consulted by emergency room physician.  Essential hypertension Continue current home regimen.  Hypothyroidism: Continue Synthroid.  Morbid obesity: Counseled.  DVT prophylaxis  Code Status: Full. Family Communication: non Disposition Plan: Home when stable.  Time spent: 75 min  Charlynne Cousins [facility] Pager [phone]  If 7PM-7AM, please contact night-coverage [email or web address] Password [email or web address] 05/09/2015, 4:09 PM

## 2015-05-09 NOTE — ED Notes (Signed)
MD at bedside. ADMITTING MD PRESENT SPEAKING WITH PT

## 2015-05-09 NOTE — ED Notes (Signed)
Patient transported to CT 

## 2015-05-09 NOTE — ED Notes (Signed)
Pt sent by doctor.  Has been having rt sided pain and NV x 2 wks.  Had CT done that showed gallstones.

## 2015-05-09 NOTE — ED Provider Notes (Signed)
CSN: [id number]     Arrival date & time 05/09/15  6834 History   First MD Initiated Contact with Patient 05/09/15 [phone]     Chief Complaint  Patient presents with  . Cholelithiasis  . Nausea  . Emesis     (Consider location/radiation/quality/duration/timing/severity/associated sxs/prior Treatment) HPI Comments: RUQ pain for past week, pain radiates to other parts of her abdomen - LUQ and R flank. She's also having some mild chest pain just above her RUQ.  Patient is a 69 y.o. female presenting with vomiting. The history is provided by the patient.  Emesis Severity:  Mild Timing:  Intermittent Quality:  Stomach contents Progression:  Worsening Associated symptoms: abdominal pain   Associated symptoms: no diarrhea   Abdominal pain:    Location:  RUQ and LUQ   Quality:  Aching and sharp   Severity:  Moderate   Duration:  1 week   Timing:  Intermittent   Progression:  Worsening   Chronicity:  New   Past Medical History  Diagnosis Date  . DJD (degenerative joint disease)   . Bursitis   . Hyperlipemia   . Depression   . GERD (gastroesophageal reflux disease)   . Obesities, morbid ([place])   . Urinary incontinence    Past Surgical History  Procedure Laterality Date  . Neck surgery    . Cardiac catheterization    . Total knee arthroplasty      x2  . Vaginal hysterectomy     Family History  Problem Relation Age of Onset  . Lung disease    . Heart attack     Social History  Substance Use Topics  . Smoking status: Never Smoker   . Smokeless tobacco: None  . Alcohol Use: No   OB History    No data available     Review of Systems  Constitutional: Negative for fever.  Respiratory: Negative for cough and shortness of breath.   Gastrointestinal: Positive for nausea, vomiting and abdominal pain. Negative for diarrhea.  All other systems reviewed and are negative.     Allergies  Contrast media; Penicillins; Sulfa antibiotics; and Morphine and related  Home  Medications   Prior to Admission medications   Medication Sig Start Date End Date Taking? Authorizing Provider  aspirin 81 MG EC tablet Take 81 mg by mouth daily.      Historical Provider, MD  atorvastatin (LIPITOR) 10 MG tablet Take 10 mg by mouth daily.    Historical Provider, MD  Cholecalciferol (VITAMIN D3) 2000 UNITS TABS Take 2,000 Units by mouth daily.     Historical Provider, MD  clonazePAM (KLONOPIN) 0.5 MG tablet Take 0.5 mg by mouth 2 (two) times daily as needed for anxiety.    Historical Provider, MD  hydrochlorothiazide (,MICROZIDE/HYDRODIURIL,) 12.5 MG capsule Take 12.5 mg by mouth daily.      Historical Provider, MD  HYDROcodone-acetaminophen (NORCO) 10-325 MG per tablet Take 1-2 tablets by mouth every 4 (four) hours as needed for moderate pain.     Historical Provider, MD  ibuprofen (ADVIL,MOTRIN) 600 MG tablet Take 600 mg by mouth every 8 (eight) hours as needed for pain.    Historical Provider, MD  levothyroxine (SYNTHROID, LEVOTHROID) 50 MCG tablet Take 50 mcg by mouth daily.      Historical Provider, MD  quinapril (ACCUPRIL) 40 MG tablet Take 40 mg by mouth at bedtime.      Historical Provider, MD  sertraline (ZOLOFT) 100 MG tablet Take 200 mg by mouth daily.  Historical Provider, MD   BP 130/82 mmHg  Pulse 97  Temp(Src) 97.7 F (36.5 C) (Oral)  Resp 19  SpO2 95% Physical Exam  Constitutional: She is oriented to person, place, and time. She appears well-developed and well-nourished. No distress.  HENT:  Head: Normocephalic and atraumatic.  Mouth/Throat: Oropharynx is clear and moist.  Eyes: EOM are normal. Pupils are equal, round, and reactive to light.  Neck: Normal range of motion. Neck supple.  Cardiovascular: Normal rate and regular rhythm.  Exam reveals no friction rub.   No murmur heard. Pulmonary/Chest: Effort normal and breath sounds normal. No respiratory distress. She has no wheezes. She has no rales.  Abdominal: Soft. She exhibits no distension. There  is tenderness (RUQ, moderate with guarding. Mild LUQ. No lower abdominal pain. ). There is no rebound.  Musculoskeletal: Normal range of motion. She exhibits no edema.  Neurological: She is alert and oriented to person, place, and time. No cranial nerve deficit. She exhibits normal muscle tone. Coordination normal.  Skin: No rash noted. She is not diaphoretic.  Nursing note and vitals reviewed.   ED Course  Procedures (including critical care time) Labs Review Labs Reviewed - No data to display  Imaging Review Ct Abdomen Pelvis Wo Contrast  05/09/2015  CLINICAL DATA:  Right side abdominal pain, nausea, vomiting for 2 weeks. Gallstones. EXAM: CT ABDOMEN AND PELVIS WITHOUT CONTRAST TECHNIQUE: Multidetector CT imaging of the abdomen and pelvis was performed following the standard protocol without IV contrast. COMPARISON:  Ultrasound 05/09/2015 FINDINGS: Lung bases are clear.  No effusions.  Heart is normal size. Mild elevation of the right hemidiaphragm. Mild fatty infiltration of the liver without focal abnormality. Small gallstone noted layering within the gallbladder. Spleen, pancreas, adrenals and kidneys have an unremarkable unenhanced appearance. No renal or ureteral stones. No hydronephrosis. 14 mm calcified splenic artery aneurysm in the left upper quadrant. Small duodenal diverticulum off the second portion of the duodenum. Stomach and small bowel are decompressed. Appendix not visualized. Large bowel unremarkable. No free fluid, free air or adenopathy. Prior hysterectomy. No adnexal masses. Urinary bladder is decompressed, grossly unremarkable. Aortic and iliac calcifications without aneurysm. No acute bony abnormality or focal bone lesion. Degenerative disc and facet disease throughout the lumbar spine. Mild levoscoliosis. IMPRESSION: No evidence of renal or ureteral stones.  No hydronephrosis. No acute findings in the abdomen or pelvis. Fatty liver. For 2 mm calcified splenic artery aneurysm.  Electronically Signed   By: Rolm Baptise M.D.   On: 05/09/2015 12:29   Dg Chest 2 View  05/09/2015  CLINICAL DATA:  Shortness of breath and chest pain for 3 month EXAM: CHEST  2 VIEW COMPARISON:  Chest radiograph April 25, 2015; chest CT May 02, 2015 FINDINGS: There is mild scarring in the left lower lobe. Lungs elsewhere clear. Heart size and pulmonary vascularity are normal. No adenopathy. No pneumothorax. There is a thoracic stimulator with the tip posteriorly at the mid thoracic level. There is postoperative change in the lower cervical spine. There is degenerative change in the thoracic spine. IMPRESSION: No edema or consolidation.  Slight scarring left lower lobe. Electronically Signed   By: Lowella Grip III M.D.   On: 05/09/2015 15:53   US Abdomen Limited Ruq  05/09/2015  CLINICAL DATA:  Vomiting for 1 month.  Hypertension. EXAM: US ABDOMEN LIMITED - RIGHT UPPER QUADRANT COMPARISON:  11/15/2010 FINDINGS: Gallbladder: Echogenic mobile 1 cm gallstone in the gallbladder. No gallbladder wall thickening or sonographic Murphy sign. No pericholecystic fluid.  Common bile duct: Diameter: 4 mm, within normal limits. Liver: Coarse echogenic liver with poor sonic penetration compatible with diffuse hepatic steatosis. IMPRESSION: 1. Cholelithiasis. Sonographic Murphy's sign absent; no gallbladder wall thickening. 2. Coarse echogenic liver with poor sonic penetration compatible with diffuse hepatic steatosis. Electronically Signed   By: Van Clines M.D.   On: 05/09/2015 10:44   I have personally reviewed and evaluated these images and lab results as part of my medical decision-making.   EKG Interpretation None      MDM   Final diagnoses:  RUQ pain    17F here with abdominal pain. She's had a right upper quadrant pain for the past week. Pain is worse with eating. Intermittent throughout the day. She's had some intermittent fevers. She had a CT done by her PCP without contrast of her  chest that showed some mild gallstones. She's had no relief with hydrocodone. Here vitals are stable. She has some mild left upper quadrant pain and a lot of right upper quadrant pain with some mild guarding. She's not pulling my hand away during exam. Will start with right quadrant ultrasound, labs, pain meds, fluids. Concern for possible cholecystitis. If right upper quadrant which was nonrevealing we'll scan her abdomen.  Ultrasound shows gallstones. CT without acute abdominal findings. I spoke with neurosurgery to see if she fit requirements for symptomatic gallstones. She does not have acute cholecystitis. Surgery evaluated patient and surgery feels she needs evaluation by medicine for some chest pain, shortness of breath. She is mildly hypoxic here. She does have a recent chest CT that was normal. Chest x-ray normal.  Admitted by medicine  Evelina Bucy, MD 05/09/15 [phone]

## 2015-05-09 NOTE — ED Notes (Signed)
MD at bedside. Surgical MD present to speak with this pt at [facility]. Delay in Transport.

## 2015-05-09 NOTE — Consult Note (Signed)
Shirley Leblanc is an 69 y.o. female.  Chief Complaint: Weakness, SOB, vomiting and chest pain, right sided pain for 2 weeks.  PCP:  [name], MD Requested by:  Dr. Evelina Bucy  (ED)  HPI:  Pt with a 2 week hx of RUQ pain, going to LUQ and right flank. Nausea and vomiting for 2 weeks also. She was seen on 04/25/15 with weakness, DOE, chest pain on and off. She also complained of abdominal pain at that time, but refused to get a CT scan at that point. She was sent home and referred to her PCP. CXR showed some congestion and small right effusion. CT scan done on 05/02/15 without contrast showed no acute issues.  She returns today with the above noted complaints, she reports some pre syncope yesterday. She notes She is sick mostly in the AM, she does not eat breakfast, but will try to eat lunch or supper, which always makes her vomit. She does have pain currently on palpation RUQ.  Work up in the ED shows she is afebrile, BP down some, but HR and RR are stable. Labs are normal aside from a mildly elevated glucose. CBC is normal. UA shows some ketones, and WBC in urine. Troponin are negative x 1. US shows: 1 cm gallstone in the gallbladder. No gallbladder wall thickening or sonographic Murphy sign. No pericholecystic fluid. CBD is 4 mm. Coarse echogenic liver with poor sonic penetration compatible with diffuse hepatic steatosis. CT scan shows: Mild elevation of the right hemidiaphragm. Mild fatty infiltration of the liver without focal abnormality. Small gallstone noted layering within the gallbladder. Spleen, pancreas, adrenals and kidneys have an unremarkable unenhanced appearance. No renal or ureteral stones. No hydronephrosis. 2 mm splenic aneurysm. Which is stable. In the ER she drops her sats of O2, she reports pre syncope, chronic DOE, sleep apnea, untreated, occasional chest pain, chronic back pain with a TENs unit implanted. We are ask to see for symptomatic  cholelithiasis.  Past Medical History  Diagnosis Date  DJD (degenerative joint disease)   Bursitis   Hyperlipemia   Depression   GERD (gastroesophageal reflux disease)   Obesities, morbid (BMI 42)   Hypertension    Sleep apnea   Hypothyroid   Chronic back pain with implanted TENS unit, prior back surgery.   Remote history of diverticulitis   Anxiety   14 mm splenic artery aneurysm     Past Surgical History  Procedure Laterality Date  . Neck surgery    . Cardiac catheterization 09/08/05    . Total knee arthroplasty      x2  . Vaginal hysterectomy Prior back surgery with implanted TENS unit on the right.      Family History  Problem Relation Age of Onset  . Lung disease    . Heart attack     Social History:  reports that she has never smoked. She does not have any smokeless tobacco history on file. She reports that she does not drink alcohol or use illicit drugs. Tobacco: 1 year Allergies:  Allergies  Allergen Reactions  . Contrast Media [Iodinated Diagnostic Agents] Anaphylaxis       . Penicillins Hives, Itching and Rash    Has patient had a PCN reaction causing immediate rash, facial/tongue/throat swelling, SOB or lightheadedness with hypotension Yes Has patient had a PCN reaction causing severe rash involving mucus membranes or skin necrosis: Yes Has patient had a PCN reaction that required hospitalization No Has patient had a PCN reaction occurring within  the last 10 years: No If all of the above answers are "NO", then may proceed with Cephalosporin use.   . Sulfa Antibiotics Hives, Itching and Rash  . Morphine And Related Itching    Prior to Admission medications   Medication Sig Start Date End Date Taking? Authorizing Provider  aspirin 81 MG EC tablet Take 81 mg by mouth daily.    Yes Historical Provider, MD  atorvastatin (LIPITOR) 10 MG tablet Take  10 mg by mouth daily.   Yes Historical Provider, MD  Cholecalciferol (VITAMIN D3) 2000 UNITS TABS Take 2,000 Units by mouth daily.    Yes Historical Provider, MD  clonazePAM (KLONOPIN) 0.5 MG tablet Take 0.5 mg by mouth 2 (two) times daily as needed for anxiety.   Yes Historical Provider, MD  hydrochlorothiazide (,MICROZIDE/HYDRODIURIL,) 12.5 MG capsule Take 12.5 mg by mouth daily.    Yes Historical Provider, MD  HYDROcodone-acetaminophen (NORCO) 10-325 MG per tablet Take 1-2 tablets by mouth every 4 (four) hours as needed for moderate pain.    Yes Historical Provider, MD  levothyroxine (SYNTHROID, LEVOTHROID) 50 MCG tablet Take 50 mcg by mouth daily.    Yes Historical Provider, MD  quinapril (ACCUPRIL) 40 MG tablet Take 40 mg by mouth daily.    Yes Historical Provider, MD  sertraline (ZOLOFT) 100 MG tablet Take 200 mg by mouth daily.    Yes Historical Provider, MD      Lab Results Last 48 Hours    Results for orders placed or performed during the hospital encounter of 05/09/15 (from the past 48 hour(s))  Urinalysis, Routine w reflex microscopic (not at [facility]) Status: Abnormal   Collection Time: 05/09/15 10:33 AM  Result Value Ref Range   Color, Urine AMBER (A) YELLOW    Comment: BIOCHEMICALS MAY BE AFFECTED BY COLOR   APPearance CLOUDY (A) CLEAR   Specific Gravity, Urine 1.030 1.005 - 1.030   pH 6.0 5.0 - 8.0   Glucose, UA NEGATIVE NEGATIVE mg/dL   Hgb urine dipstick TRACE (A) NEGATIVE   Bilirubin Urine SMALL (A) NEGATIVE   Ketones, ur NEGATIVE NEGATIVE mg/dL   Protein, ur NEGATIVE NEGATIVE mg/dL   Urobilinogen, UA 0.2 0.0 - 1.0 mg/dL   Nitrite NEGATIVE NEGATIVE   Leukocytes, UA SMALL (A) NEGATIVE  Urine microscopic-add on Status: Abnormal   Collection Time: 05/09/15 10:33 AM  Result Value Ref Range   Squamous Epithelial / LPF RARE RARE   WBC, UA 3-6 <3  WBC/hpf   RBC / HPF 0-2 <3 RBC/hpf   Bacteria, UA FEW (A) RARE   Urine-Other MUCOUS PRESENT   CBC Status: None   Collection Time: 05/09/15 10:47 AM  Result Value Ref Range   WBC 9.7 4.0 - 10.5 K/uL   RBC 4.59 3.87 - 5.11 MIL/uL   Hemoglobin 13.9 12.0 - 15.0 g/dL   HCT 41.6 36.0 - 46.0 %   MCV 90.6 78.0 - 100.0 fL   MCH 30.3 26.0 - 34.0 pg   MCHC 33.4 30.0 - 36.0 g/dL   RDW 12.9 11.5 - 15.5 %   Platelets 212 150 - 400 K/uL  Comprehensive metabolic panel Status: Abnormal   Collection Time: 05/09/15 10:47 AM  Result Value Ref Range   Sodium 138 135 - 145 mmol/L   Potassium 3.8 3.5 - 5.1 mmol/L   Chloride 104 101 - 111 mmol/L   CO2 26 22 - 32 mmol/L   Glucose, Bld 131 (H) 65 - 99 mg/dL   BUN 14 6 - 20 mg/dL  Creatinine, Ser 0.55 0.44 - 1.00 mg/dL   Calcium 9.4 8.9 - 10.3 mg/dL   Total Protein 7.1 6.5 - 8.1 g/dL   Albumin 3.9 3.5 - 5.0 g/dL   AST 20 15 - 41 U/L   ALT 17 14 - 54 U/L   Alkaline Phosphatase 85 38 - 126 U/L   Total Bilirubin 0.7 0.3 - 1.2 mg/dL   GFR calc non Af Amer >60 >60 mL/min   GFR calc Af Amer >60 >60 mL/min    Comment: (NOTE) The eGFR has been calculated using the CKD EPI equation. This calculation has not been validated in all clinical situations. eGFR's persistently <60 mL/min signify possible Chronic Kidney Disease.    Anion gap 8 5 - 15  Lipase, blood Status: None   Collection Time: 05/09/15 10:47 AM  Result Value Ref Range   Lipase 23 22 - 51 U/L  I-stat troponin, ED Status: None   Collection Time: 05/09/15 10:57 AM  Result Value Ref Range   Troponin i, poc 0.00 0.00 - 0.08 ng/mL   Comment 3       Comment: Due to the release kinetics of cTnI, a negative result within the first hours of the onset of symptoms does not rule out myocardial infarction with  certainty. If myocardial infarction is still suspected, repeat the test at appropriate intervals.       Imaging Results (Last 48 hours)    Ct Abdomen Pelvis Wo Contrast  05/09/2015 CLINICAL DATA: Right side abdominal pain, nausea, vomiting for 2 weeks. Gallstones. EXAM: CT ABDOMEN AND PELVIS WITHOUT CONTRAST TECHNIQUE: Multidetector CT imaging of the abdomen and pelvis was performed following the standard protocol without IV contrast. COMPARISON: Ultrasound 05/09/2015 FINDINGS: Lung bases are clear. No effusions. Heart is normal size. Mild elevation of the right hemidiaphragm. Mild fatty infiltration of the liver without focal abnormality. Small gallstone noted layering within the gallbladder. Spleen, pancreas, adrenals and kidneys have an unremarkable unenhanced appearance. No renal or ureteral stones. No hydronephrosis. 14 mm calcified splenic artery aneurysm in the left upper quadrant. Small duodenal diverticulum off the second portion of the duodenum. Stomach and small bowel are decompressed. Appendix not visualized. Large bowel unremarkable. No free fluid, free air or adenopathy. Prior hysterectomy. No adnexal masses. Urinary bladder is decompressed, grossly unremarkable. Aortic and iliac calcifications without aneurysm. No acute bony abnormality or focal bone lesion. Degenerative disc and facet disease throughout the lumbar spine. Mild levoscoliosis. IMPRESSION: No evidence of renal or ureteral stones. No hydronephrosis. No acute findings in the abdomen or pelvis. Fatty liver. For 2 mm calcified splenic artery aneurysm. Electronically Signed By: Rolm Baptise M.D. On: 05/09/2015 12:29   US Abdomen Limited Ruq  05/09/2015 CLINICAL DATA: Vomiting for 1 month. Hypertension. EXAM: US ABDOMEN LIMITED - RIGHT UPPER QUADRANT COMPARISON: 11/15/2010 FINDINGS: Gallbladder: Echogenic mobile 1 cm gallstone in the gallbladder. No gallbladder wall thickening or sonographic Murphy sign. No  pericholecystic fluid. Common bile duct: Diameter: 4 mm, within normal limits. Liver: Coarse echogenic liver with poor sonic penetration compatible with diffuse hepatic steatosis. IMPRESSION: 1. Cholelithiasis. Sonographic Murphy's sign absent; no gallbladder wall thickening. 2. Coarse echogenic liver with poor sonic penetration compatible with diffuse hepatic steatosis. Electronically Signed By: Van Clines M.D. On: 05/09/2015 10:44     Review of Systems  Constitutional: Positive for fever and malaise/fatigue. Weight loss: she thinks so but has not taken her temp.  HENT: Negative.  Eyes: Negative.  Respiratory: Positive for shortness of breath (DOE at 10-20 feet, scheduled  for PFT's 05/17/15). Negative for cough, hemoptysis, sputum production and wheezing.  Cardiovascular: Positive for chest pain, leg swelling and PND (she has sleep apnea but has not had the test for treatment). Negative for palpitations, orthopnea and claudication.  Gastrointestinal: Positive for heartburn, nausea, vomiting, abdominal pain (ruq, midabdomen, occasional mid abd pain: "going to her vagina ") and diarrhea. Negative for constipation (R), blood in stool and melena.  Genitourinary: Positive for dysuria and frequency.  Musculoskeletal: Positive for back pain (chronic back pain with an implanted TENS unit, prior back surgery), joint pain (Both knees hurt with walking, prior knee replacements bilat) and falls (she has been dizzy at home and pre syncope yesterday.).  Skin: Negative.  Neurological: Positive for dizziness, loss of consciousness (Pre syncope, but no sycope reported) and weakness. Negative for tingling, tremors, sensory change, speech change, focal weakness and seizures.  Endo/Heme/Allergies: Negative for environmental allergies and polydipsia. Does not bruise/bleed easily.  Psychiatric/Behavioral: The patient is nervous/anxious.    Blood pressure 116/52, pulse 89, temperature 97.7 F (36.5  C), temperature source Oral, resp. rate 22, height $RemoveBe'5\' 6"'sLQURZfeb$  (1.676 m), weight 118.842 kg (262 lb), SpO2 98 %. Physical Exam  Constitutional:  Pt is in bed very uncomfortable, on O2, sats in the low 90's. Desaturates to 88-86% off O2. Chronic back pain, pain with both knees. Trouble with mobility and some presyncope/dizziness recorded.  HENT:  Head: Normocephalic and atraumatic.  Nose: Nose normal.  Eyes: Conjunctivae and EOM are normal. Right eye exhibits no discharge. Left eye exhibits no discharge. No scleral icterus.  Neck: Normal range of motion. Neck supple. No JVD present. No tracheal deviation present. No thyromegaly present.  Cardiovascular: Normal rate, normal heart sounds and intact distal pulses.  No murmur heard. HR 90's. Sinus tachycardia at rest in bed. Respiratory: Breath sounds normal. She is in respiratory distress (mild chronic respiratory issues, cannot walk more than to BR without SOB). She has no wheezes. She has no rales. She exhibits no tenderness.  Her sats drop to 88-86% off [place] at rest, I had her move up in bed, RR up to 33,  GI: Soft. Bowel sounds are normal. She exhibits no distension and no mass. There is tenderness (RUQ currently, she points to mid line, upper chest, and down to LLQ as sites of pain before today, but not currently.). There is no rebound and no guarding.  Musculoskeletal: She exhibits tenderness. She exhibits no edema.  Lymphadenopathy:   She has no cervical adenopathy.  Neurological: She is alert. No cranial nerve deficit.  Skin: Skin is warm and dry. No rash noted. No erythema. No pallor.  Psychiatric: She has a normal mood and affect. Her behavior is normal. Judgment and thought content normal.     Assessment/Plan Nausea, vomiting, abdominal pain, with probable symptomatic cholelithiasis; 2-4 weeks duration Hiatal hernia Chest pain, DOE, with prior hx of cardiac catheterization 2007 Pre-syncope Sleep apnea, without  treatment Hypertension Hypothyroid Chronic back pain with DJD/prior back surgery/TENS unit Dyslipidemia Bilateral knee pain with prior knee replacements bilateral Hx of depression and anxiety Morbid obesity with BMI 42   Plan: I think she probably does have symptomatic cholelithiasis. Unfortunately I think her intermittent chest pain, DOE, presyncope, all need to be looked at before we consider taking her to the OR. She is scheduled to have PFT's next week for her chronic DOE. i would allow Medicine to admit and evaluate her cardiac and pulmonary issues, keep her NPO except ice, hydrate her, try PPI for her  GERD like symptoms and get her cleared for possible surgery. We would operate on her based on her symptoms, currently there is no hard data to prove her cholelithiasis is the source of her troubles. It is all based on history and exam.     Alleya Demeter 05/09/2015, 2:14 PM

## 2015-05-09 NOTE — ED Notes (Signed)
Lab delay due to ultrasound being in room

## 2015-05-09 NOTE — H&P (Deleted)
Shirley Leblanc is an 69 y.o. female.   Chief Complaint:  Weakness, SOB, vomiting and chest pain, right sided pain  for 2 weeks.   HPI:  Pt with a 2 week hx of RUQ pain, going to LUQ and right flank.  Nausea and vomiting for 2 weeks also.   She was seen on 04/25/15 with weakness, DOE, chest pain on and off.  She also complained of abdominal pain at that time, but refused to get a CT scan at that point.  She was sent home and referred to her PCP.  CXR showed some congestion and small right effusion.  CT scan done on 05/02/15 without contrast showed no acute issues.  She returns today with the above noted complaints, she reports some pre syncope yesterday.  She notes She is sick mostly in the AM, she does not eat breakfast, but will try to eat lunch or supper, which always makes her vomit.  She does have pain currently on palpation RUQ.  Work up in the ED shows she is afebrile, BP down some, but HR and RR are stable.  Labs are normal aside from a mildly elevated glucose.  CBC is normal.  UA shows some ketones, and WBC in urine.   Troponin are negative x 1.  US shows:  1 cm gallstone in the gallbladder. No gallbladder wall thickening or sonographic Murphy sign. No pericholecystic fluid.  CBD is 4 mm.   Coarse echogenic liver with poor sonic penetration compatible with diffuse hepatic steatosis.  CT scan shows:  Mild elevation of the right hemidiaphragm. Mild fatty infiltration of the liver without focal abnormality. Small gallstone noted layering within the gallbladder. Spleen, pancreas, adrenals and kidneys have an unremarkable unenhanced appearance. No renal or ureteral stones. No hydronephrosis. 2 mm splenic aneurysm. Which is stable.  In the ER she drops her sats of O2, she reports pre syncope, chronic DOE, sleep apnea, untreated, occasional chest pain, chronic back pain with a TENs unit implanted.  We are ask to see for symptomatic cholelithiasis.  Past Medical History  Diagnosis Date  DJD (degenerative  joint disease)   Bursitis   Hyperlipemia   Depression   GERD (gastroesophageal reflux disease)   Obesities, morbid (BMI 42)   Hypertension    Sleep apnea   Hypothyroid   Chronic back pain with implanted TENS unit, prior back surgery.   Remote history of diverticulitis   Anxiety   14 mm splenic artery aneurysm     Past Surgical History  Procedure Laterality Date  . Neck surgery    . Cardiac catheterization  09/08/05    . Total knee arthroplasty      x2  . Vaginal hysterectomy Prior back surgery with implanted TENS unit on the right.      Family History  Problem Relation Age of Onset  . Lung disease    . Heart attack     Social History:  reports that she has never smoked. She does not have any smokeless tobacco history on file. She reports that she does not drink alcohol or use illicit drugs. Tobacco:  1 year Allergies:  Allergies  Allergen Reactions  . Contrast Media [Iodinated Diagnostic Agents] Anaphylaxis       . Penicillins Hives, Itching and Rash    Has patient had a PCN reaction causing immediate rash, facial/tongue/throat swelling, SOB or lightheadedness with hypotension Yes Has patient had a PCN reaction causing severe rash involving mucus membranes or skin necrosis: Yes Has patient had  a PCN reaction that required hospitalization No Has patient had a PCN reaction occurring within the last 10 years: No If all of the above answers are "NO", then may proceed with Cephalosporin use.   . Sulfa Antibiotics Hives, Itching and Rash  . Morphine And Related Itching    Prior to Admission medications   Medication Sig Start Date End Date Taking? Authorizing Provider  aspirin 81 MG EC tablet Take 81 mg by mouth daily.     Yes Historical Provider, MD  atorvastatin (LIPITOR) 10 MG tablet Take 10 mg by mouth daily.   Yes Historical Provider, MD  Cholecalciferol (VITAMIN D3) 2000 UNITS TABS Take 2,000 Units by mouth daily.    Yes Historical Provider, MD  clonazePAM  (KLONOPIN) 0.5 MG tablet Take 0.5 mg by mouth 2 (two) times daily as needed for anxiety.   Yes Historical Provider, MD  hydrochlorothiazide (,MICROZIDE/HYDRODIURIL,) 12.5 MG capsule Take 12.5 mg by mouth daily.     Yes Historical Provider, MD  HYDROcodone-acetaminophen (NORCO) 10-325 MG per tablet Take 1-2 tablets by mouth every 4 (four) hours as needed for moderate pain.    Yes Historical Provider, MD  levothyroxine (SYNTHROID, LEVOTHROID) 50 MCG tablet Take 50 mcg by mouth daily.     Yes Historical Provider, MD  quinapril (ACCUPRIL) 40 MG tablet Take 40 mg by mouth daily.    Yes Historical Provider, MD  sertraline (ZOLOFT) 100 MG tablet Take 200 mg by mouth daily.    Yes Historical Provider, MD     Results for orders placed or performed during the hospital encounter of 05/09/15 (from the past 48 hour(s))  Urinalysis, Routine w reflex microscopic (not at [facility])     Status: Abnormal   Collection Time: 05/09/15 10:33 AM  Result Value Ref Range   Color, Urine AMBER (A) YELLOW    Comment: BIOCHEMICALS MAY BE AFFECTED BY COLOR   APPearance CLOUDY (A) CLEAR   Specific Gravity, Urine 1.030 1.005 - 1.030   pH 6.0 5.0 - 8.0   Glucose, UA NEGATIVE NEGATIVE mg/dL   Hgb urine dipstick TRACE (A) NEGATIVE   Bilirubin Urine SMALL (A) NEGATIVE   Ketones, ur NEGATIVE NEGATIVE mg/dL   Protein, ur NEGATIVE NEGATIVE mg/dL   Urobilinogen, UA 0.2 0.0 - 1.0 mg/dL   Nitrite NEGATIVE NEGATIVE   Leukocytes, UA SMALL (A) NEGATIVE  Urine microscopic-add on     Status: Abnormal   Collection Time: 05/09/15 10:33 AM  Result Value Ref Range   Squamous Epithelial / LPF RARE RARE   WBC, UA 3-6 <3 WBC/hpf   RBC / HPF 0-2 <3 RBC/hpf   Bacteria, UA FEW (A) RARE   Urine-Other MUCOUS PRESENT   CBC     Status: None   Collection Time: 05/09/15 10:47 AM  Result Value Ref Range   WBC 9.7 4.0 - 10.5 K/uL   RBC 4.59 3.87 - 5.11 MIL/uL   Hemoglobin 13.9 12.0 - 15.0 g/dL   HCT 41.6 36.0 - 46.0 %   MCV 90.6 78.0 - 100.0 fL    MCH 30.3 26.0 - 34.0 pg   MCHC 33.4 30.0 - 36.0 g/dL   RDW 12.9 11.5 - 15.5 %   Platelets 212 150 - 400 K/uL  Comprehensive metabolic panel     Status: Abnormal   Collection Time: 05/09/15 10:47 AM  Result Value Ref Range   Sodium 138 135 - 145 mmol/L   Potassium 3.8 3.5 - 5.1 mmol/L   Chloride 104 101 - 111 mmol/L  CO2 26 22 - 32 mmol/L   Glucose, Bld 131 (H) 65 - 99 mg/dL   BUN 14 6 - 20 mg/dL   Creatinine, Ser 0.55 0.44 - 1.00 mg/dL   Calcium 9.4 8.9 - 10.3 mg/dL   Total Protein 7.1 6.5 - 8.1 g/dL   Albumin 3.9 3.5 - 5.0 g/dL   AST 20 15 - 41 U/L   ALT 17 14 - 54 U/L   Alkaline Phosphatase 85 38 - 126 U/L   Total Bilirubin 0.7 0.3 - 1.2 mg/dL   GFR calc non Af Amer >60 >60 mL/min   GFR calc Af Amer >60 >60 mL/min    Comment: (NOTE) The eGFR has been calculated using the CKD EPI equation. This calculation has not been validated in all clinical situations. eGFR's persistently <60 mL/min signify possible Chronic Kidney Disease.    Anion gap 8 5 - 15  Lipase, blood     Status: None   Collection Time: 05/09/15 10:47 AM  Result Value Ref Range   Lipase 23 22 - 51 U/L  I-stat troponin, ED     Status: None   Collection Time: 05/09/15 10:57 AM  Result Value Ref Range   Troponin i, poc 0.00 0.00 - 0.08 ng/mL   Comment 3            Comment: Due to the release kinetics of cTnI, a negative result within the first hours of the onset of symptoms does not rule out myocardial infarction with certainty. If myocardial infarction is still suspected, repeat the test at appropriate intervals.    Ct Abdomen Pelvis Wo Contrast  05/09/2015  CLINICAL DATA:  Right side abdominal pain, nausea, vomiting for 2 weeks. Gallstones. EXAM: CT ABDOMEN AND PELVIS WITHOUT CONTRAST TECHNIQUE: Multidetector CT imaging of the abdomen and pelvis was performed following the standard protocol without IV contrast. COMPARISON:  Ultrasound 05/09/2015 FINDINGS: Lung bases are clear.  No effusions.  Heart is  normal size. Mild elevation of the right hemidiaphragm. Mild fatty infiltration of the liver without focal abnormality. Small gallstone noted layering within the gallbladder. Spleen, pancreas, adrenals and kidneys have an unremarkable unenhanced appearance. No renal or ureteral stones. No hydronephrosis. 14 mm calcified splenic artery aneurysm in the left upper quadrant. Small duodenal diverticulum off the second portion of the duodenum. Stomach and small bowel are decompressed. Appendix not visualized. Large bowel unremarkable. No free fluid, free air or adenopathy. Prior hysterectomy. No adnexal masses. Urinary bladder is decompressed, grossly unremarkable. Aortic and iliac calcifications without aneurysm. No acute bony abnormality or focal bone lesion. Degenerative disc and facet disease throughout the lumbar spine. Mild levoscoliosis. IMPRESSION: No evidence of renal or ureteral stones.  No hydronephrosis. No acute findings in the abdomen or pelvis. Fatty liver. For 2 mm calcified splenic artery aneurysm. Electronically Signed   By: Rolm Baptise M.D.   On: 05/09/2015 12:29   US Abdomen Limited Ruq  05/09/2015  CLINICAL DATA:  Vomiting for 1 month.  Hypertension. EXAM: US ABDOMEN LIMITED - RIGHT UPPER QUADRANT COMPARISON:  11/15/2010 FINDINGS: Gallbladder: Echogenic mobile 1 cm gallstone in the gallbladder. No gallbladder wall thickening or sonographic Murphy sign. No pericholecystic fluid. Common bile duct: Diameter: 4 mm, within normal limits. Liver: Coarse echogenic liver with poor sonic penetration compatible with diffuse hepatic steatosis. IMPRESSION: 1. Cholelithiasis. Sonographic Murphy's sign absent; no gallbladder wall thickening. 2. Coarse echogenic liver with poor sonic penetration compatible with diffuse hepatic steatosis. Electronically Signed   By: Cindra Eves.D.  On: 05/09/2015 10:44    Review of Systems  Constitutional: Positive for fever and malaise/fatigue. Weight loss: she  thinks so but has not taken her temp.  HENT: Negative.   Eyes: Negative.   Respiratory: Positive for shortness of breath (DOE at 10-20 feet, scheduled for PFT's 05/17/15). Negative for cough, hemoptysis, sputum production and wheezing.   Cardiovascular: Positive for chest pain, leg swelling and PND (she has sleep apnea but has not had the test for treatment). Negative for palpitations, orthopnea and claudication.  Gastrointestinal: Positive for heartburn, nausea, vomiting, abdominal pain (ruq, midabdomen,  occasional mid abd pain: "going to her vagina ") and diarrhea. Negative for constipation (R), blood in stool and melena.  Genitourinary: Positive for dysuria and frequency.  Musculoskeletal: Positive for back pain (chronic back pain with an implanted TENS unit, prior back surgery), joint pain (Both knees hurt with walking, prior knee replacements bilat) and falls (she has been dizzy at home and pre syncope yesterday.).  Skin: Negative.   Neurological: Positive for dizziness, loss of consciousness (Pre syncope, but no sycope reported) and weakness. Negative for tingling, tremors, sensory change, speech change, focal weakness and seizures.  Endo/Heme/Allergies: Negative for environmental allergies and polydipsia. Does not bruise/bleed easily.  Psychiatric/Behavioral: The patient is nervous/anxious.     Blood pressure 116/52, pulse 89, temperature 97.7 F (36.5 C), temperature source Oral, resp. rate 22, height 5' 6"  (1.676 m), weight 118.842 kg (262 lb), SpO2 98 %. Physical Exam  Constitutional:  Pt is in bed very uncomfortable, on O2, sats in the low 90's.  Desaturates to 88-86% off O2.  Chronic back pain, pain with both knees.  Trouble with mobility and some presyncope/dizziness recorded.  HENT:  Head: Normocephalic and atraumatic.  Nose: Nose normal.  Eyes: Conjunctivae and EOM are normal. Right eye exhibits no discharge. Left eye exhibits no discharge. No scleral icterus.  Neck: Normal  range of motion. Neck supple. No JVD present. No tracheal deviation present. No thyromegaly present.  Cardiovascular: Normal rate, normal heart sounds and intact distal pulses.   No murmur heard. HR 90's.  Sinus tachycardia at rest in bed.   Respiratory: Breath sounds normal. She is in respiratory distress (mild chronic respiratory issues, cannot walk more than to BR without SOB). She has no wheezes. She has no rales. She exhibits no tenderness.  Her sats drop to 88-86% off [place] at rest,  I had her move up in bed, RR up to 33,   GI: Soft. Bowel sounds are normal. She exhibits no distension and no mass. There is tenderness (RUQ currently, she points to mid line, upper chest, and down to LLQ as sites of pain before today, but not currently.). There is no rebound and no guarding.  Musculoskeletal: She exhibits tenderness. She exhibits no edema.  Lymphadenopathy:    She has no cervical adenopathy.  Neurological: She is alert. No cranial nerve deficit.  Skin: Skin is warm and dry. No rash noted. No erythema. No pallor.  Psychiatric: She has a normal mood and affect. Her behavior is normal. Judgment and thought content normal.     Assessment/Plan Nausea, vomiting, abdominal pain, with probable symptomatic cholelithiasis; 2-4 weeks duration Hiatal hernia Chest pain, DOE, with prior hx of cardiac catheterization 2007 Pre-syncope Sleep apnea, without treatment Hypertension Hypothyroid Chronic back pain with DJD/prior back surgery/TENS unit Dyslipidemia Bilateral knee pain with prior knee replacements bilateral Hx of depression and anxiety Morbid obesity with BMI 42   Plan:  I think she probably  does have symptomatic cholelithiasis.  Unfortunately I think her intermittent chest pain, DOE, presyncope, all need to be looked at before we consider taking her to the OR.  She is scheduled to have PFT's next week for her chronic DOE.   i would allow Medicine to admit and evaluate her cardiac and  pulmonary issues, keep her NPO except ice, hydrate her, try PPI for her GERD like symptoms and get her cleared for possible surgery.  We would operate on her based on her symptoms, currently there is no hard data to prove her cholelithiasis is the source of her troubles. It is all based on history and exam.      Jess Toney 05/09/2015, 2:14 PM

## 2015-05-09 NOTE — ED Notes (Signed)
Drinking oral contrast 

## 2015-05-09 NOTE — ED Notes (Signed)
Will do ekg when ultrasound finishes

## 2015-05-10 DIAGNOSIS — K8000 Calculus of gallbladder with acute cholecystitis without obstruction: Secondary | ICD-10-CM | POA: Diagnosis not present

## 2015-05-10 DIAGNOSIS — Z6841 Body Mass Index (BMI) 40.0 and over, adult: Secondary | ICD-10-CM | POA: Diagnosis not present

## 2015-05-10 DIAGNOSIS — E871 Hypo-osmolality and hyponatremia: Secondary | ICD-10-CM | POA: Diagnosis not present

## 2015-05-10 DIAGNOSIS — I110 Hypertensive heart disease with heart failure: Secondary | ICD-10-CM | POA: Diagnosis not present

## 2015-05-10 DIAGNOSIS — I10 Essential (primary) hypertension: Secondary | ICD-10-CM | POA: Diagnosis not present

## 2015-05-10 DIAGNOSIS — I5032 Chronic diastolic (congestive) heart failure: Secondary | ICD-10-CM | POA: Diagnosis not present

## 2015-05-10 DIAGNOSIS — J9601 Acute respiratory failure with hypoxia: Secondary | ICD-10-CM | POA: Diagnosis not present

## 2015-05-10 DIAGNOSIS — R1011 Right upper quadrant pain: Secondary | ICD-10-CM | POA: Diagnosis not present

## 2015-05-10 DIAGNOSIS — R1013 Epigastric pain: Secondary | ICD-10-CM | POA: Diagnosis not present

## 2015-05-10 DIAGNOSIS — K810 Acute cholecystitis: Secondary | ICD-10-CM | POA: Diagnosis not present

## 2015-05-10 DIAGNOSIS — R072 Precordial pain: Secondary | ICD-10-CM | POA: Diagnosis not present

## 2015-05-10 LAB — BASIC METABOLIC PANEL
ANION GAP: 6 (ref 5–15)
BUN: 13 mg/dL (ref 6–20)
CALCIUM: 9 mg/dL (ref 8.9–10.3)
CO2: 27 mmol/L (ref 22–32)
Chloride: 102 mmol/L (ref 101–111)
Creatinine, Ser: 0.69 mg/dL (ref 0.44–1.00)
Glucose, Bld: 118 mg/dL — ABNORMAL HIGH (ref 65–99)
Potassium: 4 mmol/L (ref 3.5–5.1)
SODIUM: 135 mmol/L (ref 135–145)

## 2015-05-10 LAB — HEPATIC FUNCTION PANEL
ALBUMIN: 3.5 g/dL (ref 3.5–5.0)
ALT: 17 U/L (ref 14–54)
AST: 19 U/L (ref 15–41)
Alkaline Phosphatase: 75 U/L (ref 38–126)
Bilirubin, Direct: 0.1 mg/dL (ref 0.1–0.5)
Indirect Bilirubin: 0.4 mg/dL (ref 0.3–0.9)
TOTAL PROTEIN: 6.3 g/dL — AB (ref 6.5–8.1)
Total Bilirubin: 0.5 mg/dL (ref 0.3–1.2)

## 2015-05-10 LAB — CBC
HEMATOCRIT: 40 % (ref 36.0–46.0)
Hemoglobin: 12.9 g/dL (ref 12.0–15.0)
MCH: 29.7 pg (ref 26.0–34.0)
MCHC: 32.3 g/dL (ref 30.0–36.0)
MCV: 92.2 fL (ref 78.0–100.0)
PLATELETS: 212 10*3/uL (ref 150–400)
RBC: 4.34 MIL/uL (ref 3.87–5.11)
RDW: 13.1 % (ref 11.5–15.5)
WBC: 7.6 10*3/uL (ref 4.0–10.5)

## 2015-05-10 NOTE — Progress Notes (Signed)
[facility] PROGRESS NOTE    Progress Note   Shirley Leblanc UDJ:[id number] DOB: 01-Mar-1946 DOA: 05/09/2015 PCP: [name], MD   Brief Narrative:   Shirley Leblanc is an 69 y.o. female they comes in for epigastric pain nausea and vomiting  Assessment/Plan:   Active Problems:   RUQ pain   Essential hypertension   Epigastric abdominal pain  She continues to have abdominal pain she relates she vomited this morning. She is tolerating her diet now. She says the protonic does not help with the pain she relates that only narcotics help with the pain. Her LFTs are pending. Multiple risk factor for surgery. We'll get a 2-D echo consulted cardiology for preop evaluation. They recommended a 2 days stress an transfer to [facility]. Her BP seems to be stable continue current regimen. Will hold lisinopril 24 hours prior to surgery.    DVT Prophylaxis - Lovenox ordered.  Family Communication: none Disposition Plan: Home when stable. Code Status:     Code Status Orders        Start     Ordered   05/09/15 1700  Full code   Continuous     05/09/15 1659        IV Access:    Peripheral IV   Procedures and diagnostic studies:   Ct Abdomen Pelvis Wo Contrast  05/09/2015  CLINICAL DATA:  Right side abdominal pain, nausea, vomiting for 2 weeks. Gallstones. EXAM: CT ABDOMEN AND PELVIS WITHOUT CONTRAST TECHNIQUE: Multidetector CT imaging of the abdomen and pelvis was performed following the standard protocol without IV contrast. COMPARISON:  Ultrasound 05/09/2015 FINDINGS: Lung bases are clear.  No effusions.  Heart is normal size. Mild elevation of the right hemidiaphragm. Mild fatty infiltration of the liver without focal abnormality. Small gallstone noted layering within the gallbladder. Spleen, pancreas, adrenals and kidneys have an unremarkable unenhanced appearance. No renal or ureteral stones. No hydronephrosis. 14 mm calcified splenic artery aneurysm in the left upper quadrant.  Small duodenal diverticulum off the second portion of the duodenum. Stomach and small bowel are decompressed. Appendix not visualized. Large bowel unremarkable. No free fluid, free air or adenopathy. Prior hysterectomy. No adnexal masses. Urinary bladder is decompressed, grossly unremarkable. Aortic and iliac calcifications without aneurysm. No acute bony abnormality or focal bone lesion. Degenerative disc and facet disease throughout the lumbar spine. Mild levoscoliosis. IMPRESSION: No evidence of renal or ureteral stones.  No hydronephrosis. No acute findings in the abdomen or pelvis. Fatty liver. For 2 mm calcified splenic artery aneurysm. Electronically Signed   By: Rolm Baptise M.D.   On: 05/09/2015 12:29   Dg Chest 2 View  05/09/2015  CLINICAL DATA:  Shortness of breath and chest pain for 3 month EXAM: CHEST  2 VIEW COMPARISON:  Chest radiograph April 25, 2015; chest CT May 02, 2015 FINDINGS: There is mild scarring in the left lower lobe. Lungs elsewhere clear. Heart size and pulmonary vascularity are normal. No adenopathy. No pneumothorax. There is a thoracic stimulator with the tip posteriorly at the mid thoracic level. There is postoperative change in the lower cervical spine. There is degenerative change in the thoracic spine. IMPRESSION: No edema or consolidation.  Slight scarring left lower lobe. Electronically Signed   By: Lowella Grip III M.D.   On: 05/09/2015 15:53   US Abdomen Limited Ruq  05/09/2015  CLINICAL DATA:  Vomiting for 1 month.  Hypertension. EXAM: US ABDOMEN LIMITED - RIGHT UPPER QUADRANT COMPARISON:  11/15/2010 FINDINGS: Gallbladder: Echogenic mobile 1  cm gallstone in the gallbladder. No gallbladder wall thickening or sonographic Murphy sign. No pericholecystic fluid. Common bile duct: Diameter: 4 mm, within normal limits. Liver: Coarse echogenic liver with poor sonic penetration compatible with diffuse hepatic steatosis. IMPRESSION: 1. Cholelithiasis. Sonographic  Murphy's sign absent; no gallbladder wall thickening. 2. Coarse echogenic liver with poor sonic penetration compatible with diffuse hepatic steatosis. Electronically Signed   By: Van Clines M.D.   On: 05/09/2015 10:44     Medical Consultants:    None.  Anti-Infectives:   Anti-infectives    None      Subjective:    HONG TIMM she continues to have abdominal pain she not vomited this morning.  Objective:    Filed Vitals:   05/09/15 2300 05/10/15 0500 05/10/15 0935 05/10/15 1300  BP: 145/69 138/76 139/74 147/68  Pulse: 76 78 86 75  Temp: 98.3 F (36.8 C) 97.5 F (36.4 C) 98.5 F (36.9 C) 98.1 F (36.7 C)  TempSrc:  Oral Oral Oral  Resp: 20 20 18 20   Height:      Weight:      SpO2: 96% 96% 95% 92%    Intake/Output Summary (Last 24 hours) at 05/10/15 1457 Last data filed at 05/10/15 1341  Gross per 24 hour  Intake   1555 ml  Output   1550 ml  Net      5 ml   Filed Weights   05/09/15 1050  Weight: 118.842 kg (262 lb)    Exam: Gen:  NAD Cardiovascular:  RRR, No M/R/G Chest and lungs:   CTAB Abdomen:  Abdomen soft, NT/ND, + BS Extremities:  No C/E/C   Data Reviewed:    Labs: Basic Metabolic Panel:  Recent Labs Lab 05/09/15 1047 05/10/15 0515  NA 138 135  K 3.8 4.0  CL 104 102  CO2 26 27  GLUCOSE 131* 118*  BUN 14 13  CREATININE 0.55 0.69  CALCIUM 9.4 9.0   GFR Estimated Creatinine Clearance: 87.1 mL/min (by C-G formula based on Cr of 0.69). Liver Function Tests:  Recent Labs Lab 05/09/15 1047 05/10/15 0515  AST 20 19  ALT 17 17  ALKPHOS 85 75  BILITOT 0.7 0.5  PROT 7.1 6.3*  ALBUMIN 3.9 3.5    Recent Labs Lab 05/09/15 1047  LIPASE 23   No results for input(s): AMMONIA in the last 168 hours. Coagulation profile No results for input(s): INR, PROTIME in the last 168 hours.  CBC:  Recent Labs Lab 05/09/15 1047 05/10/15 0515  WBC 9.7 7.6  HGB 13.9 12.9  HCT 41.6 40.0  MCV 90.6 92.2  PLT 212 212   Cardiac  Enzymes: No results for input(s): CKTOTAL, CKMB, CKMBINDEX, TROPONINI in the last 168 hours. BNP (last 3 results) No results for input(s): PROBNP in the last 8760 hours. CBG: No results for input(s): GLUCAP in the last 168 hours. D-Dimer: No results for input(s): DDIMER in the last 72 hours. Hgb A1c: No results for input(s): HGBA1C in the last 72 hours. Lipid Profile: No results for input(s): CHOL, HDL, LDLCALC, TRIG, CHOLHDL, LDLDIRECT in the last 72 hours. Thyroid function studies: No results for input(s): TSH, T4TOTAL, T3FREE, THYROIDAB in the last 72 hours.  Invalid input(s): FREET3 Anemia work up: No results for input(s): VITAMINB12, FOLATE, FERRITIN, TIBC, IRON, RETICCTPCT in the last 72 hours. Sepsis Labs:  Recent Labs Lab 05/09/15 1047 05/10/15 0515  WBC 9.7 7.6   Microbiology No results found for this or any previous visit (from the past  240 hour(s)).   Medications:   . antiseptic oral rinse  7 mL Mouth Rinse BID  . aspirin EC  81 mg Oral Daily  . atorvastatin  10 mg Oral Daily  . heparin  5,000 Units Subcutaneous 3 times per day  . hydrochlorothiazide  12.5 mg Oral Daily  . levothyroxine  50 mcg Oral QAC breakfast  . lisinopril  40 mg Oral Daily  . pantoprazole (PROTONIX) IV  40 mg Intravenous Q12H  . sertraline  200 mg Oral Daily   Continuous Infusions: . dextrose 5 % and 0.45% NaCl 50 mL/hr at 05/10/15 1403    Time spent: 25 min   LOS: 1 day   Charlynne Cousins  [facility] Pager [phone]  *Please refer to [email or web address], password [email or web address] to get updated schedule on who will round on this patient, as hospitalists switch teams weekly. If 7PM-7AM, please contact night-coverage at [email or web address], password [email or web address] for any overnight needs.  05/10/2015, 2:57 PM

## 2015-05-10 NOTE — Progress Notes (Addendum)
[facility] PROGRESS NOTE    Progress Note   [name] BZJ:[id number] DOB: Dec 02, 1945 DOA: 05/09/2015 PCP: [name], MD   Brief Narrative:   MALAISHA Leblanc is an 69 y.o. female they comes in for epigastric pain nausea and vomiting  Assessment/Plan:   Active Problems:   RUQ pain   Essential hypertension   Epigastric abdominal pain  She continues to have abdominal pain she relates she vomited this morning. She is tolerating her diet now. She says the protonic does not help with the pain she relates that only narcotics help with the pain. Her LFTs are pending. We'll get a 2-D echo consult cardiologist for preop evaluation. They recommended a 2 days stress an transfer to [facility]. Her BP seems to be stable continue current regimen. Will hold lisinopril 24 hours prior to surgery.    DVT Prophylaxis - Lovenox ordered.  Family Communication: none Disposition Plan: Home when stable. Code Status:     Code Status Orders        Start     Ordered   05/09/15 1700  Full code   Continuous     05/09/15 1659        IV Access:    Peripheral IV   Procedures and diagnostic studies:   Ct Abdomen Pelvis Wo Contrast  05/09/2015  CLINICAL DATA:  Right side abdominal pain, nausea, vomiting for 2 weeks. Gallstones. EXAM: CT ABDOMEN AND PELVIS WITHOUT CONTRAST TECHNIQUE: Multidetector CT imaging of the abdomen and pelvis was performed following the standard protocol without IV contrast. COMPARISON:  Ultrasound 05/09/2015 FINDINGS: Lung bases are clear.  No effusions.  Heart is normal size. Mild elevation of the right hemidiaphragm. Mild fatty infiltration of the liver without focal abnormality. Small gallstone noted layering within the gallbladder. Spleen, pancreas, adrenals and kidneys have an unremarkable unenhanced appearance. No renal or ureteral stones. No hydronephrosis. 14 mm calcified splenic artery aneurysm in the left upper quadrant. Small duodenal diverticulum off the  second portion of the duodenum. Stomach and small bowel are decompressed. Appendix not visualized. Large bowel unremarkable. No free fluid, free air or adenopathy. Prior hysterectomy. No adnexal masses. Urinary bladder is decompressed, grossly unremarkable. Aortic and iliac calcifications without aneurysm. No acute bony abnormality or focal bone lesion. Degenerative disc and facet disease throughout the lumbar spine. Mild levoscoliosis. IMPRESSION: No evidence of renal or ureteral stones.  No hydronephrosis. No acute findings in the abdomen or pelvis. Fatty liver. For 2 mm calcified splenic artery aneurysm. Electronically Signed   By: Rolm Baptise M.D.   On: 05/09/2015 12:29   Dg Chest 2 View  05/09/2015  CLINICAL DATA:  Shortness of breath and chest pain for 3 month EXAM: CHEST  2 VIEW COMPARISON:  Chest radiograph April 25, 2015; chest CT May 02, 2015 FINDINGS: There is mild scarring in the left lower lobe. Lungs elsewhere clear. Heart size and pulmonary vascularity are normal. No adenopathy. No pneumothorax. There is a thoracic stimulator with the tip posteriorly at the mid thoracic level. There is postoperative change in the lower cervical spine. There is degenerative change in the thoracic spine. IMPRESSION: No edema or consolidation.  Slight scarring left lower lobe. Electronically Signed   By: Lowella Grip III M.D.   On: 05/09/2015 15:53   US Abdomen Limited Ruq  05/09/2015  CLINICAL DATA:  Vomiting for 1 month.  Hypertension. EXAM: US ABDOMEN LIMITED - RIGHT UPPER QUADRANT COMPARISON:  11/15/2010 FINDINGS: Gallbladder: Echogenic mobile 1 cm gallstone in the gallbladder.  No gallbladder wall thickening or sonographic Murphy sign. No pericholecystic fluid. Common bile duct: Diameter: 4 mm, within normal limits. Liver: Coarse echogenic liver with poor sonic penetration compatible with diffuse hepatic steatosis. IMPRESSION: 1. Cholelithiasis. Sonographic Murphy's sign absent; no gallbladder  wall thickening. 2. Coarse echogenic liver with poor sonic penetration compatible with diffuse hepatic steatosis. Electronically Signed   By: Van Clines M.D.   On: 05/09/2015 10:44     Medical Consultants:    None.  Anti-Infectives:   Anti-infectives    None      Subjective:    Shirley Leblanc she continues to have abdominal pain she not vomited this morning.  Objective:    Filed Vitals:   05/09/15 1700 05/09/15 2300 05/10/15 0500 05/10/15 0935  BP: 133/84 145/69 138/76 139/74  Pulse: 84 76 78 86  Temp:  98.3 F (36.8 C) 97.5 F (36.4 C) 98.5 F (36.9 C)  TempSrc:   Oral Oral  Resp: 20 20 20 18   Height:      Weight:      SpO2: 95% 96% 96% 95%    Intake/Output Summary (Last 24 hours) at 05/10/15 1148 Last data filed at 05/10/15 1100  Gross per 24 hour  Intake 1180.83 ml  Output    700 ml  Net 480.83 ml   Filed Weights   05/09/15 1050  Weight: 118.842 kg (262 lb)    Exam: Gen:  NAD Cardiovascular:  RRR, No M/R/G Chest and lungs:   CTAB Abdomen:  Abdomen soft, NT/ND, + BS Extremities:  No C/E/C   Data Reviewed:    Labs: Basic Metabolic Panel:  Recent Labs Lab 05/09/15 1047 05/10/15 0515  NA 138 135  K 3.8 4.0  CL 104 102  CO2 26 27  GLUCOSE 131* 118*  BUN 14 13  CREATININE 0.55 0.69  CALCIUM 9.4 9.0   GFR Estimated Creatinine Clearance: 87.1 mL/min (by C-G formula based on Cr of 0.69). Liver Function Tests:  Recent Labs Lab 05/09/15 1047  AST 20  ALT 17  ALKPHOS 85  BILITOT 0.7  PROT 7.1  ALBUMIN 3.9    Recent Labs Lab 05/09/15 1047  LIPASE 23   No results for input(s): AMMONIA in the last 168 hours. Coagulation profile No results for input(s): INR, PROTIME in the last 168 hours.  CBC:  Recent Labs Lab 05/09/15 1047 05/10/15 0515  WBC 9.7 7.6  HGB 13.9 12.9  HCT 41.6 40.0  MCV 90.6 92.2  PLT 212 212   Cardiac Enzymes: No results for input(s): CKTOTAL, CKMB, CKMBINDEX, TROPONINI in the last 168  hours. BNP (last 3 results) No results for input(s): PROBNP in the last 8760 hours. CBG: No results for input(s): GLUCAP in the last 168 hours. D-Dimer: No results for input(s): DDIMER in the last 72 hours. Hgb A1c: No results for input(s): HGBA1C in the last 72 hours. Lipid Profile: No results for input(s): CHOL, HDL, LDLCALC, TRIG, CHOLHDL, LDLDIRECT in the last 72 hours. Thyroid function studies: No results for input(s): TSH, T4TOTAL, T3FREE, THYROIDAB in the last 72 hours.  Invalid input(s): FREET3 Anemia work up: No results for input(s): VITAMINB12, FOLATE, FERRITIN, TIBC, IRON, RETICCTPCT in the last 72 hours. Sepsis Labs:  Recent Labs Lab 05/09/15 1047 05/10/15 0515  WBC 9.7 7.6   Microbiology No results found for this or any previous visit (from the past 240 hour(s)).   Medications:   . antiseptic oral rinse  7 mL Mouth Rinse BID  . aspirin EC  81 mg Oral Daily  . atorvastatin  10 mg Oral Daily  . heparin  5,000 Units Subcutaneous 3 times per day  . hydrochlorothiazide  12.5 mg Oral Daily  . levothyroxine  50 mcg Oral QAC breakfast  . lisinopril  40 mg Oral Daily  . pantoprazole (PROTONIX) IV  40 mg Intravenous Q12H  . sertraline  200 mg Oral Daily   Continuous Infusions: . dextrose 5 % and 0.45% NaCl 50 mL/hr at 05/09/15 1835    Time spent: 25 min   LOS: 1 day   Charlynne Cousins  [facility] Pager [phone]  *Please refer to [email or web address], password [email or web address] to get updated schedule on who will round on this patient, as hospitalists switch teams weekly. If 7PM-7AM, please contact night-coverage at [email or web address], password [email or web address] for any overnight needs.  05/10/2015, 11:48 AM

## 2015-05-10 NOTE — Progress Notes (Signed)
Subjective: She looks fine, she is taking clears well.  She says pain is in her abdomen going to her back.  She says it's not her regular back pain.  Again labs are normal.  Objective: Vital signs in last 24 hours: Temp:  [97.5 F (36.4 C)-99.5 F (37.5 C)] 98.5 F (36.9 C) (10/13 0935) Pulse Rate:  [76-89] 86 (10/13 0935) Resp:  [14-22] 18 (10/13 0935) BP: (108-145)/(52-84) 139/74 mmHg (10/13 0935) SpO2:  [88 %-98 %] 95 % (10/13 0935) FiO2 (%):  [2 %] 2 % (10/13 0500) Last BM Date: 05/09/15 PO 360 since admit recorded 700 urine output Afebrile, VSS  sats OK on Tool Labs remain normal Diet clears Intake/Output from previous day: 10/12 0701 - 10/13 0700 In: 690.8 [P.O.:120; I.V.:570.8] Out: 700 [Urine:700] Intake/Output this shift: Total I/O In: 490 [P.O.:240; I.V.:250] Out: -   General appearance: alert, cooperative and no distress Resp: clear to auscultation bilaterally GI: soft, still tender mid epigastric area and her RUQ, but not very much.  Otherwise normal abdominal exam  Lab Results:   Recent Labs  05/09/15 1047 05/10/15 0515  WBC 9.7 7.6  HGB 13.9 12.9  HCT 41.6 40.0  PLT 212 212    BMET  Recent Labs  05/09/15 1047 05/10/15 0515  NA 138 135  K 3.8 4.0  CL 104 102  CO2 26 27  GLUCOSE 131* 118*  BUN 14 13  CREATININE 0.55 0.69  CALCIUM 9.4 9.0   PT/INR No results for input(s): LABPROT, INR in the last 72 hours.   Recent Labs Lab 05/09/15 1047  AST 20  ALT 17  ALKPHOS 85  BILITOT 0.7  PROT 7.1  ALBUMIN 3.9     Lipase     Component Value Date/Time   LIPASE 23 05/09/2015 1047     Studies/Results: Ct Abdomen Pelvis Wo Contrast  05/09/2015  CLINICAL DATA:  Right side abdominal pain, nausea, vomiting for 2 weeks. Gallstones. EXAM: CT ABDOMEN AND PELVIS WITHOUT CONTRAST TECHNIQUE: Multidetector CT imaging of the abdomen and pelvis was performed following the standard protocol without IV contrast. COMPARISON:  Ultrasound 05/09/2015  FINDINGS: Lung bases are clear.  No effusions.  Heart is normal size. Mild elevation of the right hemidiaphragm. Mild fatty infiltration of the liver without focal abnormality. Small gallstone noted layering within the gallbladder. Spleen, pancreas, adrenals and kidneys have an unremarkable unenhanced appearance. No renal or ureteral stones. No hydronephrosis. 14 mm calcified splenic artery aneurysm in the left upper quadrant. Small duodenal diverticulum off the second portion of the duodenum. Stomach and small bowel are decompressed. Appendix not visualized. Large bowel unremarkable. No free fluid, free air or adenopathy. Prior hysterectomy. No adnexal masses. Urinary bladder is decompressed, grossly unremarkable. Aortic and iliac calcifications without aneurysm. No acute bony abnormality or focal bone lesion. Degenerative disc and facet disease throughout the lumbar spine. Mild levoscoliosis. IMPRESSION: No evidence of renal or ureteral stones.  No hydronephrosis. No acute findings in the abdomen or pelvis. Fatty liver. For 2 mm calcified splenic artery aneurysm. Electronically Signed   By: Rolm Baptise M.D.   On: 05/09/2015 12:29   Dg Chest 2 View  05/09/2015  CLINICAL DATA:  Shortness of breath and chest pain for 3 month EXAM: CHEST  2 VIEW COMPARISON:  Chest radiograph April 25, 2015; chest CT May 02, 2015 FINDINGS: There is mild scarring in the left lower lobe. Lungs elsewhere clear. Heart size and pulmonary vascularity are normal. No adenopathy. No pneumothorax. There is a thoracic  stimulator with the tip posteriorly at the mid thoracic level. There is postoperative change in the lower cervical spine. There is degenerative change in the thoracic spine. IMPRESSION: No edema or consolidation.  Slight scarring left lower lobe. Electronically Signed   By: Lowella Grip III M.D.   On: 05/09/2015 15:53   US Abdomen Limited Ruq  05/09/2015  CLINICAL DATA:  Vomiting for 1 month.  Hypertension.  EXAM: US ABDOMEN LIMITED - RIGHT UPPER QUADRANT COMPARISON:  11/15/2010 FINDINGS: Gallbladder: Echogenic mobile 1 cm gallstone in the gallbladder. No gallbladder wall thickening or sonographic Murphy sign. No pericholecystic fluid. Common bile duct: Diameter: 4 mm, within normal limits. Liver: Coarse echogenic liver with poor sonic penetration compatible with diffuse hepatic steatosis. IMPRESSION: 1. Cholelithiasis. Sonographic Murphy's sign absent; no gallbladder wall thickening. 2. Coarse echogenic liver with poor sonic penetration compatible with diffuse hepatic steatosis. Electronically Signed   By: Van Clines M.D.   On: 05/09/2015 10:44    Medications: . antiseptic oral rinse  7 mL Mouth Rinse BID  . aspirin EC  81 mg Oral Daily  . atorvastatin  10 mg Oral Daily  . heparin  5,000 Units Subcutaneous 3 times per day  . hydrochlorothiazide  12.5 mg Oral Daily  . levothyroxine  50 mcg Oral QAC breakfast  . lisinopril  40 mg Oral Daily  . pantoprazole (PROTONIX) IV  40 mg Intravenous Q12H  . sertraline  200 mg Oral Daily    Assessment/Plan Nausea, vomiting, abdominal pain, with probable symptomatic cholelithiasis; 2-4 weeks duration Hiatal hernia Chest pain, DOE, with prior hx of cardiac catheterization 2007 Pre-syncope Sleep apnea, without treatment Hypertension Hypothyroid Chronic back pain with DJD/prior back surgery/TENS unit Dyslipidemia Bilateral knee pain with prior knee replacements bilateral Hx of depression and anxiety Morbid obesity with BMI 42 Antibiotics:  None DVT: heparin/SCD Plan:  Dr. Olevia Bowens is checking Echo, and getting cardiology to see.  Labs remain normal.  Will follow with you.    LOS: 1 day    Shirley Leblanc 05/10/2015

## 2015-05-10 NOTE — Consult Note (Signed)
CARDIOLOGY CONSULT NOTE   Patient ID: Shirley Leblanc MRN: [id number] DOB/AGE: 69/15/1947 69 y.o.  Admit date: 05/09/2015  Primary Physician   [name], MD Primary Cardiologist   New (previously seen by Dr. Verl Blalock) Reason for Consultation   Pre op clearance.   HPI: Shirley Leblanc is a 69 y.o. female with a history of morbid obesity, HLD, HTN, chronic dyspnea and chest pain who presented to [facility] with N/V & abdominal pain and felt to have probable symptomatic cholelithiasis x2-4 weeks duration. Cardiology is asked for pre-op clearance.   She had a dobutamine cardiolite in 07/2005 which showed evidence of ischemia in inferolat/septal segments. She was was set up for diagnostic heart catheterization as well as evaluation of her abdominal aorta and renal arteries due to her long history of HTN. This revealed single vessel CAD ( prox LAD had 40% narrowing) all other coronary arteries appeared normal. There was a large dominant normal RCA which supplied the distribution of the previous Cardiolite abnormality. She had a normal LIMA, normal LV function and normal abdominal aorta, renal, iliac and femoral arteries. She had a subsequent normal lexiscan myoview w/ EF 65% in 02/2011. She was told she did not need to follow with cardiology long term. Recent chest CT showed no acute abnormality but "scattered atherosclerotic calcifications aorta, coronary arteries and minimally proximal great vessels."  Patient is hard of hearing and a poor historian. She is extremely overweight and not very mobile. She gets around with a walker and has chronic dyspnea. She cannot go up a flight of stairs without significant dyspnea. She has a long history of chest pain with negative work ups in the past. Last episode of chest pain was last week. Her chest pain is atypical and not related to exertion.    Past Medical History  Diagnosis Date  . DJD (degenerative joint disease)   . Bursitis   . Hyperlipemia   . Depression     . GERD (gastroesophageal reflux disease)   . Obesities, morbid ([place])   . Urinary incontinence      Past Surgical History  Procedure Laterality Date  . Neck surgery    . Cardiac catheterization    . Total knee arthroplasty      x2  . Vaginal hysterectomy      Allergies  Allergen Reactions  . Contrast Media [Iodinated Diagnostic Agents] Anaphylaxis       . Penicillins Hives, Itching and Rash    Has patient had a PCN reaction causing immediate rash, facial/tongue/throat swelling, SOB or lightheadedness with hypotension Yes Has patient had a PCN reaction causing severe rash involving mucus membranes or skin necrosis: Yes Has patient had a PCN reaction that required hospitalization No Has patient had a PCN reaction occurring within the last 10 years: No If all of the above answers are "NO", then may proceed with Cephalosporin use.   . Sulfa Antibiotics Hives, Itching and Rash  . Morphine And Related Itching    I have reviewed the patient's current medications . antiseptic oral rinse  7 mL Mouth Rinse BID  . aspirin EC  81 mg Oral Daily  . atorvastatin  10 mg Oral Daily  . heparin  5,000 Units Subcutaneous 3 times per day  . hydrochlorothiazide  12.5 mg Oral Daily  . levothyroxine  50 mcg Oral QAC breakfast  . lisinopril  40 mg Oral Daily  . pantoprazole (PROTONIX) IV  40 mg Intravenous Q12H  . sertraline  200  mg Oral Daily   . dextrose 5 % and 0.45% NaCl 50 mL/hr at 05/09/15 1835   clonazePAM, HYDROcodone-acetaminophen, HYDROmorphone (DILAUDID) injection, Influenza vac split quadrivalent PF, ondansetron **OR** ondansetron (ZOFRAN) IV, polyethylene glycol  Prior to Admission medications   Medication Sig Start Date End Date Taking? Authorizing Provider  aspirin 81 MG EC tablet Take 81 mg by mouth daily.     Yes Historical Provider, MD  atorvastatin (LIPITOR) 10 MG tablet Take 10 mg by mouth daily.   Yes Historical Provider, MD  Cholecalciferol (VITAMIN D3) 2000 UNITS TABS  Take 2,000 Units by mouth daily.    Yes Historical Provider, MD  clonazePAM (KLONOPIN) 0.5 MG tablet Take 0.5 mg by mouth 2 (two) times daily as needed for anxiety.   Yes Historical Provider, MD  hydrochlorothiazide (,MICROZIDE/HYDRODIURIL,) 12.5 MG capsule Take 12.5 mg by mouth daily.     Yes Historical Provider, MD  HYDROcodone-acetaminophen (NORCO) 10-325 MG per tablet Take 1-2 tablets by mouth every 4 (four) hours as needed for moderate pain.    Yes Historical Provider, MD  levothyroxine (SYNTHROID, LEVOTHROID) 50 MCG tablet Take 50 mcg by mouth daily.     Yes Historical Provider, MD  quinapril (ACCUPRIL) 40 MG tablet Take 40 mg by mouth daily.    Yes Historical Provider, MD  sertraline (ZOLOFT) 100 MG tablet Take 200 mg by mouth daily.    Yes Historical Provider, MD     Social History   Social History  . Marital Status: Widowed    Spouse Name: N/A  . Number of Children: 4  . Years of Education: N/A   Occupational History  . RETIRED    Social History Main Topics  . Smoking status: Never Smoker   . Smokeless tobacco: Not on file  . Alcohol Use: No  . Drug Use: No  . Sexual Activity: No   Other Topics Concern  . Not on file   Social History Narrative    No family status information on file.   Family History  Problem Relation Age of Onset  . Lung disease    . Heart attack    . Lung disease Mother   . Heart attack Father      ROS:  Full 14 point review of systems complete and found to be negative unless listed above.  Physical Exam: Blood pressure 139/74, pulse 86, temperature 98.5 F (36.9 C), temperature source Oral, resp. rate 18, height 5\' 6"  (1.676 m), weight 262 lb (118.842 kg), SpO2 95 %.  General: Well developed, well nourished, female in no acute distress. obese Head: Eyes PERRLA, No xanthomas.   Normocephalic and atraumatic, oropharynx without edema or exudate.   Lungs: ctab Heart: HRRR S1 S2, no rub/gallop, Heart irregular rate and rhythm with S1, S2   murmur. pulses are 2+ extrem.   Neck: No carotid bruits. No lymphadenopathy. No JVD. Abdomen: Bowel sounds present, abdomen soft and non-tender without masses or hernias noted. Msk:  No spine or cva tenderness. No weakness, no joint deformities or effusions. Extremities: No clubbing or cyanosis. Trace LE bilateral  edema.  Neuro: Alert and oriented X 3. No focal deficits noted. Psych:  Good affect, responds appropriately Skin: No rashes or lesions noted.  Labs:   Lab Results  Component Value Date   WBC 7.6 05/10/2015   HGB 12.9 05/10/2015   HCT 40.0 05/10/2015   MCV 92.2 05/10/2015   PLT 212 05/10/2015   No results for input(s): INR in the last 72 hours.  Recent Labs Lab 05/09/15 1047 05/10/15 0515  NA 138 135  K 3.8 4.0  CL 104 102  CO2 26 27  BUN 14 13  CREATININE 0.55 0.69  CALCIUM 9.4 9.0  PROT 7.1  --   BILITOT 0.7  --   ALKPHOS 85  --   ALT 17  --   AST 20  --   GLUCOSE 131* 118*  ALBUMIN 3.9  --      Recent Labs  05/09/15 1057  TROPIPOC 0.00   PRO B NATRIURETIC PEPTIDE (BNP)  Date/Time Value Ref Range Status  01/18/2014 01:15 PM 130.2* 0 - 125 pg/mL Final  11/17/2010 09:45 AM <30.0 0.0 - 100.0 pg/mL Final     LIPASE  Date/Time Value Ref Range Status  05/09/2015 10:47 AM 23 22 - 51 U/L Final    Echo: pending  ECG: Sinus, LAFB  Abnormal R-wave progression, late transition Borderline T abnormalities, diffuse leads Prolonged QT interval  Radiology:  Ct Abdomen Pelvis Wo Contrast  05/09/2015  CLINICAL DATA:  Right side abdominal pain, nausea, vomiting for 2 weeks. Gallstones. EXAM: CT ABDOMEN AND PELVIS WITHOUT CONTRAST TECHNIQUE: Multidetector CT imaging of the abdomen and pelvis was performed following the standard protocol without IV contrast. COMPARISON:  Ultrasound 05/09/2015 FINDINGS: Lung bases are clear.  No effusions.  Heart is normal size. Mild elevation of the right hemidiaphragm. Mild fatty infiltration of the liver without focal  abnormality. Small gallstone noted layering within the gallbladder. Spleen, pancreas, adrenals and kidneys have an unremarkable unenhanced appearance. No renal or ureteral stones. No hydronephrosis. 14 mm calcified splenic artery aneurysm in the left upper quadrant. Small duodenal diverticulum off the second portion of the duodenum. Stomach and small bowel are decompressed. Appendix not visualized. Large bowel unremarkable. No free fluid, free air or adenopathy. Prior hysterectomy. No adnexal masses. Urinary bladder is decompressed, grossly unremarkable. Aortic and iliac calcifications without aneurysm. No acute bony abnormality or focal bone lesion. Degenerative disc and facet disease throughout the lumbar spine. Mild levoscoliosis. IMPRESSION: No evidence of renal or ureteral stones.  No hydronephrosis. No acute findings in the abdomen or pelvis. Fatty liver. For 2 mm calcified splenic artery aneurysm. Electronically Signed   By: Rolm Baptise M.D.   On: 05/09/2015 12:29   Dg Chest 2 View  05/09/2015  CLINICAL DATA:  Shortness of breath and chest pain for 3 month EXAM: CHEST  2 VIEW COMPARISON:  Chest radiograph April 25, 2015; chest CT May 02, 2015 FINDINGS: There is mild scarring in the left lower lobe. Lungs elsewhere clear. Heart size and pulmonary vascularity are normal. No adenopathy. No pneumothorax. There is a thoracic stimulator with the tip posteriorly at the mid thoracic level. There is postoperative change in the lower cervical spine. There is degenerative change in the thoracic spine. IMPRESSION: No edema or consolidation.  Slight scarring left lower lobe. Electronically Signed   By: Lowella Grip III M.D.   On: 05/09/2015 15:53   US Abdomen Limited Ruq  05/09/2015  CLINICAL DATA:  Vomiting for 1 month.  Hypertension. EXAM: US ABDOMEN LIMITED - RIGHT UPPER QUADRANT COMPARISON:  11/15/2010 FINDINGS: Gallbladder: Echogenic mobile 1 cm gallstone in the gallbladder. No gallbladder wall  thickening or sonographic Murphy sign. No pericholecystic fluid. Common bile duct: Diameter: 4 mm, within normal limits. Liver: Coarse echogenic liver with poor sonic penetration compatible with diffuse hepatic steatosis. IMPRESSION: 1. Cholelithiasis. Sonographic Murphy's sign absent; no gallbladder wall thickening. 2. Coarse echogenic liver with poor sonic penetration compatible  with diffuse hepatic steatosis. Electronically Signed   By: Van Clines M.D.   On: 05/09/2015 10:44    ASSESSMENT AND PLAN:    Active Problems:   RUQ pain   Essential hypertension   Epigastric abdominal pain   Shirley Leblanc is a 69 y.o. female with a history of morbid obesity, HLD, HTN, chronic dyspnea and chest pain who presented to [facility] with N/V & abdominal pain and felt to have probable symptomatic cholelithiasis x2-4 weeks duration. Cardiology is asked for pre-op clearance.   Pre-op Clearance: patient complains of dyspnea and chest pain. Review of prior records reveal that this is chronic. She states it has gotten worse recently. She does have ongoing RFs of HTN, HLD and obesity, evidence of coronary calcifications incidentally noted on Chest CT, 40% stenosis in LAD on 2007 heart cath. Her most recent ischemic w/up was in 2012 and normal will EF 65% and no evidence of ischemia. Her functional capacity is very poor and she likely cannot perform 4 METS without difficulty. Agree with 2D ECHO being ordered by primary team. Will defer to Dr. Percival Spanish on thoughts about further ischemic w/up prior to planned cholecystectomy.   SignedCrista Luria 05/10/2015 12:33 PM  Pager [phone]  Co-Sign MD  History and all data above reviewed.  Patient examined.  I agree with the findings as above. She has had progressive dyspnea. She has had frequent chest pain for the past month.  She had previous non obstructive disease.  She has a very low functional level.   The patient exam reveals COR:RRR  ,  Lungs:  Decreased breath sounds  ,  Abd: Positive bowel sounds, no rebound no guarding, Ext No edema  .  All available labs, radiology testing, previous records reviewed. Agree with documented assessment and plan. Preop:  Given the symptoms and situation, I cannot exclude the possibility that she has obstructive high risk CAD.  She needs to be screened and I think a stress perfusion study would be reasonable.  However, this will need to be done at [facility] and as a two day study.  Of note she reports a recent echo at [place] and so I will cancel the one ordered and we can try to locate this one.    Shirley Leblanc  2:15 PM  05/10/2015

## 2015-05-11 ENCOUNTER — Encounter (HOSPITAL_COMMUNITY): Payer: Medicare Other

## 2015-05-11 ENCOUNTER — Observation Stay (HOSPITAL_COMMUNITY): Payer: Medicare Other

## 2015-05-11 DIAGNOSIS — I5032 Chronic diastolic (congestive) heart failure: Secondary | ICD-10-CM | POA: Diagnosis not present

## 2015-05-11 DIAGNOSIS — K810 Acute cholecystitis: Secondary | ICD-10-CM | POA: Diagnosis not present

## 2015-05-11 DIAGNOSIS — R072 Precordial pain: Secondary | ICD-10-CM | POA: Diagnosis not present

## 2015-05-11 DIAGNOSIS — E039 Hypothyroidism, unspecified: Secondary | ICD-10-CM

## 2015-05-11 DIAGNOSIS — J9601 Acute respiratory failure with hypoxia: Secondary | ICD-10-CM | POA: Diagnosis not present

## 2015-05-11 DIAGNOSIS — I10 Essential (primary) hypertension: Secondary | ICD-10-CM | POA: Diagnosis not present

## 2015-05-11 DIAGNOSIS — K8000 Calculus of gallbladder with acute cholecystitis without obstruction: Secondary | ICD-10-CM | POA: Diagnosis not present

## 2015-05-11 DIAGNOSIS — R1011 Right upper quadrant pain: Secondary | ICD-10-CM | POA: Diagnosis not present

## 2015-05-11 DIAGNOSIS — I110 Hypertensive heart disease with heart failure: Secondary | ICD-10-CM | POA: Diagnosis not present

## 2015-05-11 DIAGNOSIS — J9811 Atelectasis: Secondary | ICD-10-CM | POA: Diagnosis not present

## 2015-05-11 DIAGNOSIS — E871 Hypo-osmolality and hyponatremia: Secondary | ICD-10-CM | POA: Diagnosis not present

## 2015-05-11 DIAGNOSIS — Z6841 Body Mass Index (BMI) 40.0 and over, adult: Secondary | ICD-10-CM | POA: Diagnosis not present

## 2015-05-11 DIAGNOSIS — R05 Cough: Secondary | ICD-10-CM | POA: Diagnosis not present

## 2015-05-11 MED ORDER — REGADENOSON 0.4 MG/5ML IV SOLN
INTRAVENOUS | Status: AC
  Administered 2015-05-11: 0.4 mg via INTRAVENOUS
  Filled 2015-05-11: qty 5

## 2015-05-11 MED ORDER — GUAIFENESIN ER 600 MG PO TB12
1200.0000 mg | ORAL_TABLET | Freq: Two times a day (BID) | ORAL | Status: DC
  Administered 2015-05-11 – 2015-05-12 (×3): 1200 mg via ORAL
  Administered 2015-05-12: 600 mg via ORAL
  Administered 2015-05-13 – 2015-05-17 (×8): 1200 mg via ORAL
  Filled 2015-05-11 (×12): qty 2

## 2015-05-11 MED ORDER — REGADENOSON 0.4 MG/5ML IV SOLN
0.4000 mg | Freq: Once | INTRAVENOUS | Status: AC
  Administered 2015-05-11: 0.4 mg via INTRAVENOUS
  Filled 2015-05-11: qty 5

## 2015-05-11 NOTE — Progress Notes (Signed)
Patient Demographics  Shirley Leblanc, is a 69 y.o. female, DOB - August 01, 1945, NID:[id number]  Admit date - 05/09/2015   Admitting Physician Charlynne Cousins, MD  Outpatient Primary MD for the patient is [name], MD  LOS - 2   Chief Complaint  Patient presents with  . Cholelithiasis  . Nausea  . Emesis       Admission HPI/Brief narrative: 69 y/o female presents with  2 week hx of RUQ pain, CT abd /pelvis significant for gallstone, and by surgery, pain consistent with pericolic, cardiology following patient for preoperative clearance, patient transferred from [facility] to [facility] cone 10/13 for planned 2 days stress test prior to surgery.  Subjective:   [name] today has, No headache, No chest pain, No Nausea, No new weakness tingling or numbness, No Cough - SOB., Complaining of abdominal pain.  Assessment & Plan    Active Problems:   RUQ pain   Essential hypertension   Epigastric abdominal pain  Right upper quadrant pain secondary to be antecolic - Workup Significant for cholelithiasis - Cardiology consult appreciated, plan is for nuclear stress test for preop evaluation. - Possible need for laparoscopic cholecystectomy depending on stress test results. - Continue with when necessary pain and nausea medication.  Hypertension - Acceptable, continue with lisinopril and hydrochlorothiazide  Hyperlipidemia - Continue with statin  Hypothyroidism - Continue with Synthroid  Patient having productive cough today, will check chest x-ray, will start on Mucinex, and incentive spirometry, and ambulate.  Code Status: Full  Family Communication: none at bedside  Disposition Plan: Pending further workup   Procedures  none   Consults   Gen. Surgery Cardiology   Medications  Scheduled Meds: . antiseptic oral rinse  7 mL Mouth Rinse BID  . aspirin EC  81 mg Oral Daily  .  atorvastatin  10 mg Oral Daily  . heparin  5,000 Units Subcutaneous 3 times per day  . hydrochlorothiazide  12.5 mg Oral Daily  . levothyroxine  50 mcg Oral QAC breakfast  . lisinopril  40 mg Oral Daily  . pantoprazole (PROTONIX) IV  40 mg Intravenous Q12H  . sertraline  200 mg Oral Daily   Continuous Infusions: . dextrose 5 % and 0.45% NaCl 50 mL/hr at 05/10/15 1403   PRN Meds:.clonazePAM, HYDROcodone-acetaminophen, HYDROmorphone (DILAUDID) injection, Influenza vac split quadrivalent PF, ondansetron **OR** ondansetron (ZOFRAN) IV, polyethylene glycol  DVT Prophylaxis  heparin  Lab Results  Component Value Date   PLT 212 05/10/2015    Antibiotics    Anti-infectives    None          Objective:   Filed Vitals:   05/10/15 2058 05/10/15 2220 05/11/15 0441 05/11/15 0928  BP: 118/59 125/78 119/60 135/78  Pulse: 73 85 86   Temp: 98.1 F (36.7 C) 98.1 F (36.7 C) 98.6 F (37 C)   TempSrc: Oral Oral Oral   Resp: 16 16 16    Height:  5\' 6"  (1.676 m)    Weight:  123.832 kg (273 lb)    SpO2: 98% 92% 92%     Wt Readings from Last 3 Encounters:  05/10/15 123.832 kg (273 lb)  03/27/11 125.646 kg (277 lb)  03/14/11 125.193 kg (276 lb)     Intake/Output Summary (  Last 24 hours) at 05/11/15 1013 Last data filed at 05/11/15 0800  Gross per 24 hour  Intake 864.17 ml  Output   2200 ml  Net -1335.83 ml     Physical Exam  Awake Alert, Oriented X 3, [place].AT,PERRAL Supple Neck,No JVD,  Symmetrical Chest wall movement, Good air movement bilaterally,  RRR,No Gallops,Rubs or new Murmurs, No Parasternal Heave +ve B.Sounds, Abd Soft, mild right upper quadrant tenderness,No rebound - guarding or rigidity. No Cyanosis, Clubbing or edema, No new Rash or bruise     Data Review   Micro Results No results found for this or any previous visit (from the past 240 hour(s)).  Radiology Reports Ct Abdomen Pelvis Wo Contrast  05/09/2015  CLINICAL DATA:  Right side abdominal pain,  nausea, vomiting for 2 weeks. Gallstones. EXAM: CT ABDOMEN AND PELVIS WITHOUT CONTRAST TECHNIQUE: Multidetector CT imaging of the abdomen and pelvis was performed following the standard protocol without IV contrast. COMPARISON:  Ultrasound 05/09/2015 FINDINGS: Lung bases are clear.  No effusions.  Heart is normal size. Mild elevation of the right hemidiaphragm. Mild fatty infiltration of the liver without focal abnormality. Small gallstone noted layering within the gallbladder. Spleen, pancreas, adrenals and kidneys have an unremarkable unenhanced appearance. No renal or ureteral stones. No hydronephrosis. 14 mm calcified splenic artery aneurysm in the left upper quadrant. Small duodenal diverticulum off the second portion of the duodenum. Stomach and small bowel are decompressed. Appendix not visualized. Large bowel unremarkable. No free fluid, free air or adenopathy. Prior hysterectomy. No adnexal masses. Urinary bladder is decompressed, grossly unremarkable. Aortic and iliac calcifications without aneurysm. No acute bony abnormality or focal bone lesion. Degenerative disc and facet disease throughout the lumbar spine. Mild levoscoliosis. IMPRESSION: No evidence of renal or ureteral stones.  No hydronephrosis. No acute findings in the abdomen or pelvis. Fatty liver. For 2 mm calcified splenic artery aneurysm. Electronically Signed   By: Rolm Baptise M.D.   On: 05/09/2015 12:29   Dg Chest 2 View  05/09/2015  CLINICAL DATA:  Shortness of breath and chest pain for 3 month EXAM: CHEST  2 VIEW COMPARISON:  Chest radiograph April 25, 2015; chest CT May 02, 2015 FINDINGS: There is mild scarring in the left lower lobe. Lungs elsewhere clear. Heart size and pulmonary vascularity are normal. No adenopathy. No pneumothorax. There is a thoracic stimulator with the tip posteriorly at the mid thoracic level. There is postoperative change in the lower cervical spine. There is degenerative change in the thoracic spine.  IMPRESSION: No edema or consolidation.  Slight scarring left lower lobe. Electronically Signed   By: Lowella Grip III M.D.   On: 05/09/2015 15:53   Dg Chest 2 View  04/25/2015  CLINICAL DATA:  ongoing intermittent chest pain for 6 months EXAM: CHEST  2 VIEW COMPARISON:  Radiograph 08/16/2014 FINDINGS: Anterior cervical fixation noted. Stimulation electrodes in the lower thoracic spine central canal. Normal cardiac silhouette with ectatic aorta. Central venous pulmonary pulmonary congestion is present. No pulmonary edema. Small RIGHT effusion. IMPRESSION: Central venous congestion small RIGHT effusion. Electronically Signed   By: Suzy Bouchard M.D.   On: 04/25/2015 13:00   Ct Chest Wo Contrast  05/02/2015  CLINICAL DATA:  Shortness of breath, mid chest pain intermittently, productive cough for 2 weeks, small RIGHT pleural effusion on recent chest radiograph EXAM: CT CHEST WITHOUT CONTRAST TECHNIQUE: Multidetector CT imaging of the chest was performed following the standard protocol without IV contrast. COMPARISON:  01/18/2014 CT chest, chest radiograph 04/25/2015  FINDINGS: Scattered atherosclerotic calcifications aorta, coronary arteries and minimally proximal great vessels. No thoracic adenopathy. Minimal dependent density within gallbladder question tiny gallstones. Small hiatal hernia questioned. Calcified splenic artery aneurysm 15 x 12 mm unchanged. Intraspinal stimulator. Lungs clear. No pulmonary infiltrate, pleural effusion or pneumothorax. Mild elevation of RIGHT diaphragm and prominent epicardial fat pads noted. Mild degenerative disc disease changes thoracic spine with evidence of prior cervical spine fusion. IMPRESSION: Small hiatal hernia. Scattered calcified atherosclerotic plaque including a small calcified splenic artery aneurysm. No acute intra thoracic abnormalities. Suspect minimal cholelithiasis. Electronically Signed   By: Lavonia Dana M.D.   On: 05/02/2015 09:17   US Abdomen  Limited Ruq  05/09/2015  CLINICAL DATA:  Vomiting for 1 month.  Hypertension. EXAM: US ABDOMEN LIMITED - RIGHT UPPER QUADRANT COMPARISON:  11/15/2010 FINDINGS: Gallbladder: Echogenic mobile 1 cm gallstone in the gallbladder. No gallbladder wall thickening or sonographic Murphy sign. No pericholecystic fluid. Common bile duct: Diameter: 4 mm, within normal limits. Liver: Coarse echogenic liver with poor sonic penetration compatible with diffuse hepatic steatosis. IMPRESSION: 1. Cholelithiasis. Sonographic Murphy's sign absent; no gallbladder wall thickening. 2. Coarse echogenic liver with poor sonic penetration compatible with diffuse hepatic steatosis. Electronically Signed   By: Van Clines M.D.   On: 05/09/2015 10:44     CBC  Recent Labs Lab 05/09/15 1047 05/10/15 0515  WBC 9.7 7.6  HGB 13.9 12.9  HCT 41.6 40.0  PLT 212 212  MCV 90.6 92.2  MCH 30.3 29.7  MCHC 33.4 32.3  RDW 12.9 13.1    Chemistries   Recent Labs Lab 05/09/15 1047 05/10/15 0515  NA 138 135  K 3.8 4.0  CL 104 102  CO2 26 27  GLUCOSE 131* 118*  BUN 14 13  CREATININE 0.55 0.69  CALCIUM 9.4 9.0  AST 20 19  ALT 17 17  ALKPHOS 85 75  BILITOT 0.7 0.5   ------------------------------------------------------------------------------------------------------------------ estimated creatinine clearance is 89.2 mL/min (by C-G formula based on Cr of 0.69). ------------------------------------------------------------------------------------------------------------------ No results for input(s): HGBA1C in the last 72 hours. ------------------------------------------------------------------------------------------------------------------ No results for input(s): CHOL, HDL, LDLCALC, TRIG, CHOLHDL, LDLDIRECT in the last 72 hours. ------------------------------------------------------------------------------------------------------------------ No results for input(s): TSH, T4TOTAL, T3FREE, THYROIDAB in the last 72  hours.  Invalid input(s): FREET3 ------------------------------------------------------------------------------------------------------------------ No results for input(s): VITAMINB12, FOLATE, FERRITIN, TIBC, IRON, RETICCTPCT in the last 72 hours.  Coagulation profile No results for input(s): INR, PROTIME in the last 168 hours.  No results for input(s): DDIMER in the last 72 hours.  Cardiac Enzymes No results for input(s): CKMB, TROPONINI, MYOGLOBIN in the last 168 hours.  Invalid input(s): CK ------------------------------------------------------------------------------------------------------------------ Invalid input(s): POCBNP     Time Spent in minutes   35 minutes   Dua Mehler M.D on 05/11/2015 at 10:13 AM  Between 7am to 7pm - Pager - [phone]  After 7pm go to [email or web address] - password [email or web address]  [facility]   Office  [phone]

## 2015-05-11 NOTE — Progress Notes (Signed)
    SUBJECTIVE:  No further chest pain.     PHYSICAL EXAM Filed Vitals:   05/10/15 2058 05/10/15 2220 05/11/15 0441 05/11/15 0928  BP: 118/59 125/78 119/60 135/78  Pulse: 73 85 86   Temp: 98.1 F (36.7 C) 98.1 F (36.7 C) 98.6 F (37 C)   TempSrc: Oral Oral Oral   Resp: 16 16 16    Height:  5\' 6"  (1.676 m)    Weight:  273 lb (123.832 kg)    SpO2: 98% 92% 92%    General:  No distress Lungs:  Clear Heart:  RRR Abdomen:  Positive bowel sounds, no rebound no guarding Extremities:  No edema   LABS:  No results found for this or any previous visit (from the past 24 hour(s)).  Intake/Output Summary (Last 24 hours) at 05/11/15 0946 Last data filed at 05/10/15 2200  Gross per 24 hour  Intake 1104.17 ml  Output   1950 ml  Net -845.83 ml     ASSESSMENT AND PLAN:  CHEST PAIN:   Day one of a two day stress test today.  Cholecystectomy is pending this result.  No change in therapy.    Jeneen Rinks [facility] 05/11/2015 9:46 AM

## 2015-05-11 NOTE — Progress Notes (Signed)
Subjective: No complaints of pain, complains of productive cough  Objective: Vital signs in last 24 hours: Temp:  [98.1 F (36.7 C)-98.6 F (37 C)] 98.6 F (37 C) (10/14 0441) Pulse Rate:  [73-86] 86 (10/14 0441) Resp:  [16-20] 16 (10/14 0441) BP: (118-147)/(59-78) 119/60 mmHg (10/14 0441) SpO2:  [92 %-98 %] 92 % (10/14 0441) Weight:  [123.832 kg (273 lb)] 123.832 kg (273 lb) (10/13 2220) Last BM Date: 05/09/15  Intake/Output from previous day: 10/13 0701 - 10/14 0700 In: 1104.2 [P.O.:720; I.V.:384.2] Out: 1950 [Urine:1950] Intake/Output this shift:    GI: mild ruq tenderness, soft  Lab Results:   Recent Labs  05/09/15 1047 05/10/15 0515  WBC 9.7 7.6  HGB 13.9 12.9  HCT 41.6 40.0  PLT 212 212   BMET  Recent Labs  05/09/15 1047 05/10/15 0515  NA 138 135  K 3.8 4.0  CL 104 102  CO2 26 27  GLUCOSE 131* 118*  BUN 14 13  CREATININE 0.55 0.69  CALCIUM 9.4 9.0   PT/INR No results for input(s): LABPROT, INR in the last 72 hours. ABG No results for input(s): PHART, HCO3 in the last 72 hours.  Invalid input(s): PCO2, PO2  Studies/Results: Ct Abdomen Pelvis Wo Contrast  05/09/2015  CLINICAL DATA:  Right side abdominal pain, nausea, vomiting for 2 weeks. Gallstones. EXAM: CT ABDOMEN AND PELVIS WITHOUT CONTRAST TECHNIQUE: Multidetector CT imaging of the abdomen and pelvis was performed following the standard protocol without IV contrast. COMPARISON:  Ultrasound 05/09/2015 FINDINGS: Lung bases are clear.  No effusions.  Heart is normal size. Mild elevation of the right hemidiaphragm. Mild fatty infiltration of the liver without focal abnormality. Small gallstone noted layering within the gallbladder. Spleen, pancreas, adrenals and kidneys have an unremarkable unenhanced appearance. No renal or ureteral stones. No hydronephrosis. 14 mm calcified splenic artery aneurysm in the left upper quadrant. Small duodenal diverticulum off the second portion of the duodenum.  Stomach and small bowel are decompressed. Appendix not visualized. Large bowel unremarkable. No free fluid, free air or adenopathy. Prior hysterectomy. No adnexal masses. Urinary bladder is decompressed, grossly unremarkable. Aortic and iliac calcifications without aneurysm. No acute bony abnormality or focal bone lesion. Degenerative disc and facet disease throughout the lumbar spine. Mild levoscoliosis. IMPRESSION: No evidence of renal or ureteral stones.  No hydronephrosis. No acute findings in the abdomen or pelvis. Fatty liver. For 2 mm calcified splenic artery aneurysm. Electronically Signed   By: Rolm Baptise M.D.   On: 05/09/2015 12:29   Dg Chest 2 View  05/09/2015  CLINICAL DATA:  Shortness of breath and chest pain for 3 month EXAM: CHEST  2 VIEW COMPARISON:  Chest radiograph April 25, 2015; chest CT May 02, 2015 FINDINGS: There is mild scarring in the left lower lobe. Lungs elsewhere clear. Heart size and pulmonary vascularity are normal. No adenopathy. No pneumothorax. There is a thoracic stimulator with the tip posteriorly at the mid thoracic level. There is postoperative change in the lower cervical spine. There is degenerative change in the thoracic spine. IMPRESSION: No edema or consolidation.  Slight scarring left lower lobe. Electronically Signed   By: Lowella Grip III M.D.   On: 05/09/2015 15:53   US Abdomen Limited Ruq  05/09/2015  CLINICAL DATA:  Vomiting for 1 month.  Hypertension. EXAM: US ABDOMEN LIMITED - RIGHT UPPER QUADRANT COMPARISON:  11/15/2010 FINDINGS: Gallbladder: Echogenic mobile 1 cm gallstone in the gallbladder. No gallbladder wall thickening or sonographic Murphy sign. No pericholecystic fluid. Common bile  duct: Diameter: 4 mm, within normal limits. Liver: Coarse echogenic liver with poor sonic penetration compatible with diffuse hepatic steatosis. IMPRESSION: 1. Cholelithiasis. Sonographic Murphy's sign absent; no gallbladder wall thickening. 2. Coarse  echogenic liver with poor sonic penetration compatible with diffuse hepatic steatosis. Electronically Signed   By: Van Clines M.D.   On: 05/09/2015 10:44    Anti-infectives: Anti-infectives    None      Assessment/Plan: Biliary colic  Will await cardiac stress test before discussing possible lap chole  [facility] 05/11/2015

## 2015-05-12 ENCOUNTER — Observation Stay (HOSPITAL_BASED_OUTPATIENT_CLINIC_OR_DEPARTMENT_OTHER): Payer: Medicare Other

## 2015-05-12 DIAGNOSIS — R1011 Right upper quadrant pain: Secondary | ICD-10-CM | POA: Diagnosis not present

## 2015-05-12 DIAGNOSIS — I5032 Chronic diastolic (congestive) heart failure: Secondary | ICD-10-CM | POA: Diagnosis not present

## 2015-05-12 DIAGNOSIS — R0789 Other chest pain: Secondary | ICD-10-CM | POA: Diagnosis not present

## 2015-05-12 DIAGNOSIS — I110 Hypertensive heart disease with heart failure: Secondary | ICD-10-CM | POA: Diagnosis not present

## 2015-05-12 DIAGNOSIS — K8000 Calculus of gallbladder with acute cholecystitis without obstruction: Secondary | ICD-10-CM | POA: Diagnosis not present

## 2015-05-12 DIAGNOSIS — R079 Chest pain, unspecified: Secondary | ICD-10-CM | POA: Diagnosis not present

## 2015-05-12 DIAGNOSIS — E871 Hypo-osmolality and hyponatremia: Secondary | ICD-10-CM | POA: Diagnosis not present

## 2015-05-12 DIAGNOSIS — Z6841 Body Mass Index (BMI) 40.0 and over, adult: Secondary | ICD-10-CM | POA: Diagnosis not present

## 2015-05-12 DIAGNOSIS — J9601 Acute respiratory failure with hypoxia: Secondary | ICD-10-CM | POA: Diagnosis not present

## 2015-05-12 DIAGNOSIS — I10 Essential (primary) hypertension: Secondary | ICD-10-CM

## 2015-05-12 LAB — BASIC METABOLIC PANEL
Anion gap: 9 (ref 5–15)
BUN: 9 mg/dL (ref 6–20)
CALCIUM: 9.4 mg/dL (ref 8.9–10.3)
CHLORIDE: 96 mmol/L — AB (ref 101–111)
CO2: 28 mmol/L (ref 22–32)
Creatinine, Ser: 0.8 mg/dL (ref 0.44–1.00)
GFR calc non Af Amer: >60 mL/min (ref >60)
Glucose, Bld: 114 mg/dL — ABNORMAL HIGH (ref 65–99)
POTASSIUM: 4 mmol/L (ref 3.5–5.1)
Sodium: 133 mmol/L — ABNORMAL LOW (ref 135–145)

## 2015-05-12 LAB — NM MYOCAR MULTI W/SPECT W/WALL MOTION / EF
CHL CUP RESTING HR STRESS: 85 {beats}/min
Peak HR: 92 {beats}/min

## 2015-05-12 LAB — CBC
HEMATOCRIT: 39.2 % (ref 36.0–46.0)
HEMOGLOBIN: 12.7 g/dL (ref 12.0–15.0)
MCH: 29.7 pg (ref 26.0–34.0)
MCHC: 32.4 g/dL (ref 30.0–36.0)
MCV: 91.8 fL (ref 78.0–100.0)
Platelets: 220 10*3/uL (ref 150–400)
RBC: 4.27 MIL/uL (ref 3.87–5.11)
RDW: 13.4 % (ref 11.5–15.5)
WBC: 10.3 10*3/uL (ref 4.0–10.5)

## 2015-05-12 MED ORDER — HYDROMORPHONE HCL 1 MG/ML IJ SOLN
0.5000 mg | INTRAMUSCULAR | Status: DC | PRN
  Administered 2015-05-13: 0.5 mg via INTRAVENOUS
  Filled 2015-05-12: qty 1

## 2015-05-12 MED ORDER — TECHNETIUM TC 99M SESTAMIBI - CARDIOLITE
30.0000 | Freq: Once | INTRAVENOUS | Status: AC | PRN
  Administered 2015-05-12: 30 via INTRAVENOUS

## 2015-05-12 MED ORDER — TECHNETIUM TC 99M SESTAMIBI GENERIC - CARDIOLITE
30.0000 | Freq: Once | INTRAVENOUS | Status: AC | PRN
  Administered 2015-05-12: 30 via INTRAVENOUS

## 2015-05-12 NOTE — Progress Notes (Signed)
  Echocardiogram 2D Echocardiogram has been performed.  Shirley Leblanc 05/12/2015, 1:42 PM

## 2015-05-12 NOTE — Progress Notes (Signed)
Patient chart's reviewed per unit system list.    Per 05/12/15 MD progress note:  69 y/o female presents with 2 week hx of RUQ pain, CT abd /pelvis significant for gallstone, and by surgery, pain consistent with pericolic, cardiology following patient for preoperative clearance, patient transferred to [facility] cone 10/13 for planned 2 days stress test prior to surgery.    Patient has a history of morbid obesity and currently uses a walker at home.   Attempted to speak with patient via phone, no answer and unable to leave a message.   Physical therapy evaluation pending.  Unit RNCM will follow up for further discharge planning needs.   No discharge planning needs identified at this time.  Miriam Liles H. Annia Friendly, BSN, [facility]

## 2015-05-12 NOTE — Progress Notes (Signed)
PT Cancellation Note  Patient Details Name: Shirley Leblanc MRN: [id number] DOB: 31-Aug-1945   Cancelled Treatment:    Reason Eval/Treat Not Completed: Other (comment);Fatigue/lethargy limiting ability to participate (Tired and asked PT to check later)   Ramond Dial 05/12/2015, 11:48 AM   Mee Hives, PT MS Acute Rehab Dept. Number: [facility] [id number] and [place] [phone]

## 2015-05-12 NOTE — Progress Notes (Addendum)
    SUBJECTIVE:  No further chest pain.     PHYSICAL EXAM Filed Vitals:   05/11/15 1042 05/11/15 1339 05/11/15 2005 05/12/15 1103  BP:  134/77 116/53 101/51  Pulse: 87 87 77 76  Temp:  97.5 F (36.4 C) 98.5 F (36.9 C)   TempSrc:  Oral Oral   Resp:  17 18   Height:      Weight:      SpO2:  93% 93%    General:  No distress Lungs:  Clear Heart:  RRR Abdomen:  Positive bowel sounds, no rebound no guarding Extremities:  No edema   LABS:  Results for orders placed or performed during the hospital encounter of 05/09/15 (from the past 24 hour(s))  CBC     Status: None   Collection Time: 05/12/15  4:00 AM  Result Value Ref Range   WBC 10.3 4.0 - 10.5 K/uL   RBC 4.27 3.87 - 5.11 MIL/uL   Hemoglobin 12.7 12.0 - 15.0 g/dL   HCT 39.2 36.0 - 46.0 %   MCV 91.8 78.0 - 100.0 fL   MCH 29.7 26.0 - 34.0 pg   MCHC 32.4 30.0 - 36.0 g/dL   RDW 13.4 11.5 - 15.5 %   Platelets 220 150 - 400 K/uL  Basic metabolic panel     Status: Abnormal   Collection Time: 05/12/15  4:00 AM  Result Value Ref Range   Sodium 133 (L) 135 - 145 mmol/L   Potassium 4.0 3.5 - 5.1 mmol/L   Chloride 96 (L) 101 - 111 mmol/L   CO2 28 22 - 32 mmol/L   Glucose, Bld 114 (H) 65 - 99 mg/dL   BUN 9 6 - 20 mg/dL   Creatinine, Ser 0.80 0.44 - 1.00 mg/dL   Calcium 9.4 8.9 - 10.3 mg/dL   GFR calc non Af Amer >60 >60 mL/min   GFR calc Af Amer >60 >60 mL/min   Anion gap 9 5 - 15    Intake/Output Summary (Last 24 hours) at 05/12/15 1449 Last data filed at 05/12/15 1100  Gross per 24 hour  Intake    240 ml  Output   1000 ml  Net   -760 ml   Myoview: Perfusion: No decreased activity in the left ventricle on stress imaging to suggest reversible ischemia or infarction.  Wall Motion: Normal left ventricular wall motion. No left ventricular dilation.  Left Ventricular Ejection Fraction: 67 %  ASSESSMENT AND PLAN:  Pre-op clearance: Patient had chest pain and shortness of breath.  Had stress test done which  shows no evidence of ischemia with an EF of 67% and no wall motion abnormalities.  Chest pain has resolved at this point.  She is therefore at intermediate risk for an intermediate risk procedure.  Would recommend continuing her statin during the periop period to decrease her risk further.   Shirley Leblanc 05/12/2015 2:49 PM

## 2015-05-12 NOTE — Progress Notes (Signed)
   05/12/15 1100  PT Visit Information  Last PT Received On 05/12/15  Reason Eval/Treat Not Completed Other (comment);Fatigue/lethargy limiting ability to participate (Tired and asked PT to check later)  History of Present Illness 69 yo female with significant history of 2 weeks N & V from gallstone and cardiology is going to clear for surgery if able.  Also noted bibasilar atelectasis and thoracic neurostim device.  Precautions  Precautions Fall (telemetry)  Restrictions  Weight Bearing Restrictions No  Home Living  Family/patient expects to be discharged to: Private residence  Living Arrangements Alone  Available Help at Discharge Family  Type of Max One level  Bathroom Shower/Tub Walk-in shower  [facility];Shower seat;Walker - 2 wheels  Prior Function  Level of Independence Independent with assistive device(s)  Communication  Communication No difficulties  Pain Assessment  Pain Assessment No/denies pain  Cognition  Arousal/Alertness Awake/alert  Behavior During Therapy WFL for tasks assessed/performed  Overall Cognitive Status Within Functional Limits for tasks assessed  Upper Extremity Assessment  Upper Extremity Assessment Overall WFL for tasks assessed  Lower Extremity Assessment  Lower Extremity Assessment Generalized weakness  Cervical / Trunk Assessment  Cervical / Trunk Assessment Normal  Bed Mobility  Overal bed mobility Modified Independent  Transfers  Overall transfer level Modified independent  Equipment used Rolling walker (2 wheeled)  Ambulation/Gait  Ambulation/Gait assistance Min guard  Ambulation Distance (Feet) 80 Feet  Assistive device Rolling walker (2 wheeled);1 person hand held assist  Gait Pattern/deviations Step-through pattern;Wide base of support;Shuffle;Decreased dorsiflexion - left;Decreased dorsiflexion - right  General Gait Details Pt has slow pace and  pulses were controlled with both 80' out and back with sitting rest  Gait velocity reduced  Gait velocity interpretation Below normal speed for age/gender  Balance  Overall balance assessment Modified Independent  General Comments  General comments (skin integrity, edema, etc.) Pt is demonstrating low endurance with rest needed to walk.  Will need to follow up with therapy at home using HHPT and instruct her in setting limits  PT - End of Session  Activity Tolerance Patient tolerated treatment well;Patient limited by fatigue  Patient left in bed;with call bell/phone within reach  Nurse Communication Mobility status  PT Assessment  PT Therapy Diagnosis  Difficulty walking  PT Recommendation/Assessment Patient needs continued PT services  PT Problem List Decreased strength;Decreased range of motion;Decreased activity tolerance;Decreased balance;Decreased mobility;Decreased coordination;Decreased knowledge of use of DME;Cardiopulmonary status limiting activity;Obesity  Barriers to Discharge Decreased caregiver support  PT Plan  PT Frequency (ACUTE ONLY) Min 3X/week  PT Treatment/Interventions (ACUTE ONLY) DME instruction;Gait training;Functional mobility training;Therapeutic activities;Therapeutic exercise;Balance training;Neuromuscular re-education;Patient/family education  PT Recommendation  Follow Up Recommendations Home health PT  PT equipment None recommended by PT  Individuals Consulted  Consulted and Agree with Results and Recommendations Patient  Acute Rehab PT Goals  Patient Stated Goal to get home  PT Goal Formulation With patient  Time For Goal Achievement 05/26/15  Potential to Achieve Goals Good  PT Time Calculation  PT Start Time (ACUTE ONLY) 1525  PT Stop Time (ACUTE ONLY) 1550  PT Time Calculation (min) (ACUTE ONLY) 25 min  PT General Charges  $$ ACUTE PT VISIT 1 Procedure  PT Evaluation  $Initial PT Evaluation Tier I 1 Procedure  PT Treatments  $Gait Training 8-22  mins   Mee Hives, PT MS Acute Rehab Dept. Number: [facility] [id number] and [place] [phone]

## 2015-05-12 NOTE — Progress Notes (Signed)
Patient Demographics  Shirley Leblanc, is a 69 y.o. female, DOB - April 27, 1946, BTD:[id number]  Admit date - 05/09/2015   Admitting Physician Charlynne Cousins, MD  Outpatient Primary MD for the patient is [name], MD  LOS - 3   Chief Complaint  Patient presents with  . Cholelithiasis  . Nausea  . Emesis       Admission HPI/Brief narrative: 69 y/o female presents with  2 week hx of RUQ pain, CT abd /pelvis significant for gallstone, and by surgery, pain consistent with pericolic, cardiology following patient for preoperative clearance, patient transferred from [facility] to [facility] cone 10/13 for planned 2 days stress test prior to surgery.  Subjective:   Shirley Leblanc today has, No headache, No chest pain, No Nausea, No new weakness tingling or numbness, No Cough - SOB., Complaining of abdominal pain.  Assessment & Plan    Active Problems:   RUQ pain   Essential hypertension   Epigastric abdominal pain  Right upper quadrant pain secondary to be antecolic - Workup Significant for cholelithiasis - Cardiology consult appreciated, plan is for nuclear stress test for preop evaluation, today is day #2 of stress test, already done, readings are pending. - Possible need for laparoscopic cholecystectomy pending on stress test results. - Continue with when necessary pain and nausea medication.  Hypertension - Acceptable, continue with lisinopril , will discontinue hydrochlorothiazide giving sodium was trending down gradually.  Hyperlipidemia - Continue with statin  Hypothyroidism - Continue with Synthroid  Cough with productive sputum  - Secondary to bibasilar atelectasis as evident on chest x-ray , continue with Mucinex, and incentive spirometry, and ambulate.  Hyponatremia - Mild, will discontinue hydrochlorothiazide, will discontinue D5 half-normal saline, recheck BMP in a.m..  Code Status:  Full  Family Communication: none at bedside  Disposition Plan: Pending further workup   Procedures  none   Consults   Gen. Surgery Cardiology   Medications  Scheduled Meds: . antiseptic oral rinse  7 mL Mouth Rinse BID  . aspirin EC  81 mg Oral Daily  . atorvastatin  10 mg Oral Daily  . guaiFENesin  1,200 mg Oral BID  . heparin  5,000 Units Subcutaneous 3 times per day  . levothyroxine  50 mcg Oral QAC breakfast  . lisinopril  40 mg Oral Daily  . pantoprazole (PROTONIX) IV  40 mg Intravenous Q12H  . sertraline  200 mg Oral Daily   Continuous Infusions:   PRN Meds:.clonazePAM, HYDROcodone-acetaminophen, HYDROmorphone (DILAUDID) injection, Influenza vac split quadrivalent PF, ondansetron **OR** ondansetron (ZOFRAN) IV, polyethylene glycol  DVT Prophylaxis  heparin  Lab Results  Component Value Date   PLT 220 05/12/2015    Antibiotics    Anti-infectives    None          Objective:   Filed Vitals:   05/11/15 1042 05/11/15 1339 05/11/15 2005 05/12/15 1103  BP:  134/77 116/53 101/51  Pulse: 87 87 77 76  Temp:  97.5 F (36.4 C) 98.5 F (36.9 C)   TempSrc:  Oral Oral   Resp:  17 18   Height:      Weight:      SpO2:  93% 93%     Wt Readings from Last 3 Encounters:  05/10/15 364.680  kg (273 lb)  03/27/11 125.646 kg (277 lb)  03/14/11 125.193 kg (276 lb)     Intake/Output Summary (Last 24 hours) at 05/12/15 1143 Last data filed at 05/12/15 1100  Gross per 24 hour  Intake    600 ml  Output   1200 ml  Net   -600 ml     Physical Exam  Awake Alert, Oriented X 3, [place].AT,PERRAL Supple Neck,No JVD,  Symmetrical Chest wall movement, Good air movement bilaterally,  RRR,No Gallops,Rubs or new Murmurs, No Parasternal Heave +ve B.Sounds, Abd Soft, mild right upper quadrant tenderness,No rebound - guarding or rigidity. No Cyanosis, Clubbing or edema, No new Rash or bruise     Data Review   Micro Results No results found for this or any previous  visit (from the past 240 hour(s)).  Radiology Reports Ct Abdomen Pelvis Wo Contrast  05/09/2015  CLINICAL DATA:  Right side abdominal pain, nausea, vomiting for 2 weeks. Gallstones. EXAM: CT ABDOMEN AND PELVIS WITHOUT CONTRAST TECHNIQUE: Multidetector CT imaging of the abdomen and pelvis was performed following the standard protocol without IV contrast. COMPARISON:  Ultrasound 05/09/2015 FINDINGS: Lung bases are clear.  No effusions.  Heart is normal size. Mild elevation of the right hemidiaphragm. Mild fatty infiltration of the liver without focal abnormality. Small gallstone noted layering within the gallbladder. Spleen, pancreas, adrenals and kidneys have an unremarkable unenhanced appearance. No renal or ureteral stones. No hydronephrosis. 14 mm calcified splenic artery aneurysm in the left upper quadrant. Small duodenal diverticulum off the second portion of the duodenum. Stomach and small bowel are decompressed. Appendix not visualized. Large bowel unremarkable. No free fluid, free air or adenopathy. Prior hysterectomy. No adnexal masses. Urinary bladder is decompressed, grossly unremarkable. Aortic and iliac calcifications without aneurysm. No acute bony abnormality or focal bone lesion. Degenerative disc and facet disease throughout the lumbar spine. Mild levoscoliosis. IMPRESSION: No evidence of renal or ureteral stones.  No hydronephrosis. No acute findings in the abdomen or pelvis. Fatty liver. For 2 mm calcified splenic artery aneurysm. Electronically Signed   By: Rolm Baptise M.D.   On: 05/09/2015 12:29   Dg Chest 2 View  05/09/2015  CLINICAL DATA:  Shortness of breath and chest pain for 3 month EXAM: CHEST  2 VIEW COMPARISON:  Chest radiograph April 25, 2015; chest CT May 02, 2015 FINDINGS: There is mild scarring in the left lower lobe. Lungs elsewhere clear. Heart size and pulmonary vascularity are normal. No adenopathy. No pneumothorax. There is a thoracic stimulator with the tip  posteriorly at the mid thoracic level. There is postoperative change in the lower cervical spine. There is degenerative change in the thoracic spine. IMPRESSION: No edema or consolidation.  Slight scarring left lower lobe. Electronically Signed   By: Lowella Grip III M.D.   On: 05/09/2015 15:53   Dg Chest 2 View  04/25/2015  CLINICAL DATA:  ongoing intermittent chest pain for 6 months EXAM: CHEST  2 VIEW COMPARISON:  Radiograph 08/16/2014 FINDINGS: Anterior cervical fixation noted. Stimulation electrodes in the lower thoracic spine central canal. Normal cardiac silhouette with ectatic aorta. Central venous pulmonary pulmonary congestion is present. No pulmonary edema. Small RIGHT effusion. IMPRESSION: Central venous congestion small RIGHT effusion. Electronically Signed   By: Suzy Bouchard M.D.   On: 04/25/2015 13:00   Ct Chest Wo Contrast  05/02/2015  CLINICAL DATA:  Shortness of breath, mid chest pain intermittently, productive cough for 2 weeks, small RIGHT pleural effusion on recent chest radiograph EXAM: CT CHEST  WITHOUT CONTRAST TECHNIQUE: Multidetector CT imaging of the chest was performed following the standard protocol without IV contrast. COMPARISON:  01/18/2014 CT chest, chest radiograph 04/25/2015 FINDINGS: Scattered atherosclerotic calcifications aorta, coronary arteries and minimally proximal great vessels. No thoracic adenopathy. Minimal dependent density within gallbladder question tiny gallstones. Small hiatal hernia questioned. Calcified splenic artery aneurysm 15 x 12 mm unchanged. Intraspinal stimulator. Lungs clear. No pulmonary infiltrate, pleural effusion or pneumothorax. Mild elevation of RIGHT diaphragm and prominent epicardial fat pads noted. Mild degenerative disc disease changes thoracic spine with evidence of prior cervical spine fusion. IMPRESSION: Small hiatal hernia. Scattered calcified atherosclerotic plaque including a small calcified splenic artery aneurysm. No acute  intra thoracic abnormalities. Suspect minimal cholelithiasis. Electronically Signed   By: Lavonia Dana M.D.   On: 05/02/2015 09:17   Dg Chest Port 1 View  05/11/2015  CLINICAL DATA:  69 year old female with acute cough today. EXAM: PORTABLE CHEST 1 VIEW COMPARISON:  05/09/2015 and prior radiographs FINDINGS: Upper limits normal heart size again identified. Minimal subsegmental bibasilar atelectasis noted. There is no evidence of focal airspace disease, pulmonary edema, suspicious pulmonary nodule/mass, pleural effusion, or pneumothorax. No acute bony abnormalities are identified. A remote proximal left humeral fracture is again noted. Lower cervical spine surgical hardware and thoracic neurostimulator again identified IMPRESSION: Minimal subsegmental bibasilar atelectasis. Electronically Signed   By: Margarette Canada M.D.   On: 05/11/2015 12:59   US Abdomen Limited Ruq  05/09/2015  CLINICAL DATA:  Vomiting for 1 month.  Hypertension. EXAM: US ABDOMEN LIMITED - RIGHT UPPER QUADRANT COMPARISON:  11/15/2010 FINDINGS: Gallbladder: Echogenic mobile 1 cm gallstone in the gallbladder. No gallbladder wall thickening or sonographic Murphy sign. No pericholecystic fluid. Common bile duct: Diameter: 4 mm, within normal limits. Liver: Coarse echogenic liver with poor sonic penetration compatible with diffuse hepatic steatosis. IMPRESSION: 1. Cholelithiasis. Sonographic Murphy's sign absent; no gallbladder wall thickening. 2. Coarse echogenic liver with poor sonic penetration compatible with diffuse hepatic steatosis. Electronically Signed   By: Van Clines M.D.   On: 05/09/2015 10:44     CBC  Recent Labs Lab 05/09/15 1047 05/10/15 0515 05/12/15 0400  WBC 9.7 7.6 10.3  HGB 13.9 12.9 12.7  HCT 41.6 40.0 39.2  PLT 212 212 220  MCV 90.6 92.2 91.8  MCH 30.3 29.7 29.7  MCHC 33.4 32.3 32.4  RDW 12.9 13.1 13.4    Chemistries   Recent Labs Lab 05/09/15 1047 05/10/15 0515 05/12/15 0400  NA 138 135 133*   K 3.8 4.0 4.0  CL 104 102 96*  CO2 26 27 28   GLUCOSE 131* 118* 114*  BUN 14 13 9   CREATININE 0.55 0.69 0.80  CALCIUM 9.4 9.0 9.4  AST 20 19  --   ALT 17 17  --   ALKPHOS 85 75  --   BILITOT 0.7 0.5  --    ------------------------------------------------------------------------------------------------------------------ estimated creatinine clearance is 89.2 mL/min (by C-G formula based on Cr of 0.8). ------------------------------------------------------------------------------------------------------------------ No results for input(s): HGBA1C in the last 72 hours. ------------------------------------------------------------------------------------------------------------------ No results for input(s): CHOL, HDL, LDLCALC, TRIG, CHOLHDL, LDLDIRECT in the last 72 hours. ------------------------------------------------------------------------------------------------------------------ No results for input(s): TSH, T4TOTAL, T3FREE, THYROIDAB in the last 72 hours.  Invalid input(s): FREET3 ------------------------------------------------------------------------------------------------------------------ No results for input(s): VITAMINB12, FOLATE, FERRITIN, TIBC, IRON, RETICCTPCT in the last 72 hours.  Coagulation profile No results for input(s): INR, PROTIME in the last 168 hours.  No results for input(s): DDIMER in the last 72 hours.  Cardiac Enzymes No results for input(s): CKMB,  TROPONINI, MYOGLOBIN in the last 168 hours.  Invalid input(s): CK ------------------------------------------------------------------------------------------------------------------ Invalid input(s): POCBNP     Time Spent in minutes   35 minutes   Larrisa Cravey M.D on 05/12/2015 at 11:43 AM  Between 7am to 7pm - Pager - [phone]  After 7pm go to [email or web address] - password [facility]  [facility]   Office  [phone]

## 2015-05-13 DIAGNOSIS — Z6841 Body Mass Index (BMI) 40.0 and over, adult: Secondary | ICD-10-CM | POA: Diagnosis not present

## 2015-05-13 DIAGNOSIS — I10 Essential (primary) hypertension: Secondary | ICD-10-CM | POA: Diagnosis not present

## 2015-05-13 DIAGNOSIS — I5032 Chronic diastolic (congestive) heart failure: Secondary | ICD-10-CM | POA: Diagnosis not present

## 2015-05-13 DIAGNOSIS — K8050 Calculus of bile duct without cholangitis or cholecystitis without obstruction: Secondary | ICD-10-CM

## 2015-05-13 DIAGNOSIS — K8000 Calculus of gallbladder with acute cholecystitis without obstruction: Secondary | ICD-10-CM | POA: Diagnosis not present

## 2015-05-13 DIAGNOSIS — R1011 Right upper quadrant pain: Secondary | ICD-10-CM | POA: Diagnosis not present

## 2015-05-13 DIAGNOSIS — I110 Hypertensive heart disease with heart failure: Secondary | ICD-10-CM | POA: Diagnosis not present

## 2015-05-13 DIAGNOSIS — E871 Hypo-osmolality and hyponatremia: Secondary | ICD-10-CM | POA: Diagnosis not present

## 2015-05-13 DIAGNOSIS — R072 Precordial pain: Secondary | ICD-10-CM

## 2015-05-13 DIAGNOSIS — K810 Acute cholecystitis: Secondary | ICD-10-CM | POA: Diagnosis not present

## 2015-05-13 DIAGNOSIS — J9601 Acute respiratory failure with hypoxia: Secondary | ICD-10-CM | POA: Diagnosis not present

## 2015-05-13 LAB — BASIC METABOLIC PANEL
ANION GAP: 9 (ref 5–15)
BUN: <5 mg/dL — ABNORMAL LOW (ref 6–20)
CALCIUM: 9.5 mg/dL (ref 8.9–10.3)
CHLORIDE: 99 mmol/L — AB (ref 101–111)
CO2: 32 mmol/L (ref 22–32)
Creatinine, Ser: 0.66 mg/dL (ref 0.44–1.00)
GFR calc Af Amer: >60 mL/min (ref >60)
GFR calc non Af Amer: >60 mL/min (ref >60)
GLUCOSE: 124 mg/dL — AB (ref 65–99)
POTASSIUM: 3.3 mmol/L — AB (ref 3.5–5.1)
Sodium: 140 mmol/L (ref 135–145)

## 2015-05-13 MED ORDER — LISINOPRIL 40 MG PO TABS
40.0000 mg | ORAL_TABLET | Freq: Every day | ORAL | Status: DC
  Administered 2015-05-14 – 2015-05-17 (×3): 40 mg via ORAL
  Filled 2015-05-13 (×4): qty 1

## 2015-05-13 MED ORDER — POTASSIUM CHLORIDE CRYS ER 20 MEQ PO TBCR
40.0000 meq | EXTENDED_RELEASE_TABLET | Freq: Once | ORAL | Status: AC
  Administered 2015-05-13: 40 meq via ORAL
  Filled 2015-05-13: qty 2

## 2015-05-13 MED ORDER — POTASSIUM CHLORIDE CRYS ER 20 MEQ PO TBCR: 20.0000 meq | EXTENDED_RELEASE_TABLET | Freq: Once | ORAL | Status: DC

## 2015-05-13 MED ORDER — VITAMIN D 1000 UNITS PO TABS
2000.0000 [IU] | ORAL_TABLET | Freq: Every day | ORAL | Status: DC
  Administered 2015-05-13 – 2015-05-17 (×5): 2000 [IU] via ORAL
  Filled 2015-05-13 (×5): qty 2

## 2015-05-13 MED ORDER — METRONIDAZOLE IN NACL 5-0.79 MG/ML-% IV SOLN: 500.0000 mg | INTRAVENOUS | Status: DC

## 2015-05-13 MED ORDER — CIPROFLOXACIN IN D5W 400 MG/200ML IV SOLN: 400.0000 mg | INTRAVENOUS | Status: DC

## 2015-05-13 MED ORDER — CHLORHEXIDINE GLUCONATE 4 % EX LIQD
1.0000 "application " | Freq: Once | CUTANEOUS | Status: AC
  Administered 2015-05-13: 1 via TOPICAL
  Filled 2015-05-13: qty 30

## 2015-05-13 MED ORDER — VITAMIN D3 50 MCG (2000 UT) PO TABS: 2000.0000 [IU] | ORAL_TABLET | Freq: Every day | ORAL | Status: DC

## 2015-05-13 MED ORDER — CHLORHEXIDINE GLUCONATE 4 % EX LIQD
1.0000 "application " | Freq: Once | CUTANEOUS | Status: AC
  Administered 2015-05-14: 1 via TOPICAL
  Filled 2015-05-13: qty 15

## 2015-05-13 NOTE — Progress Notes (Signed)
[address] [place], [place] [address] Phone: [phone] FAX: [phone]   BRAILEIGH LANDENBERGER [id number] 1946-04-23   Problem List:   Active Problems:   RUQ pain   Essential hypertension   Epigastric abdominal pain   Precordial chest pain      * No surgery found *    Assessment  Biliary colic w chronic cholecystits  Cardiac clearance:  "Had stress test done which shows no evidence of ischemia with an EF of 67% and no wall motion abnormalities. Chest pain has resolved at this pointShe is therefore at intermediate risk for an intermediate risk procedure."  Plan:  -classic story of biliary colic w ERUQ back radiating to back & N/V.  Rest of diff dx unlikely.  Rec cholecystectomy.  Clearance just done - will try to tomorrow given emergencies today.  Dr Ninfa Linden:  The anatomy & physiology of hepatobiliary & pancreatic function was discussed.  The pathophysiology of gallbladder dysfunction was discussed.  Natural history risks without surgery was discussed.   I feel the risks of no intervention will lead to serious problems that outweigh the operative risks; therefore, I recommended cholecystectomy to remove the pathology.  I explained laparoscopic techniques with possible need for an open approach.  Probable cholangiogram to evaluate the bilary tract was explained as well.    Risks such as bleeding, infection, abscess, leak, injury to other organs, need for repair of tissues / organs, need for further treatment, stroke, heart attack, death, and other risks were discussed.  I noted a good likelihood this will help address the problem.  Possibility that this will not correct all abdominal symptoms was explained.  Goals of post-operative recovery were discussed as well.  We will work to minimize complications.  An educational handout further explaining the pathology and treatment options was given as well.  Questions were answered.   The patient expresses understanding & wishes to proceed with surgery.   -VTE prophylaxis- SCDs, etc -mobilize as tolerated to help recovery  Adin Hector, M.D., F.A.C.S. Gastrointestinal and Minimally Invasive Surgery [facility] [address] [place], [place] [address] [phone] Main / Paging   05/13/2015  Subjective:  RUQ sore but not severe Appetite OK Cardiolgy & medicine managing  Objective:  Vital signs:  Filed Vitals:   05/11/15 2005 05/12/15 1103 05/12/15 2001 05/13/15 0407  BP: 116/53 101/51 123/76 124/81  Pulse: 77 76 75 82  Temp: 98.5 F (36.9 C)  97.7 F (36.5 C) 97.7 F (36.5 C)  TempSrc: Oral  Oral Oral  Resp: _0 Height:      Weight:      SpO2: 93%  92% 92%    Last BM Date: 05/09/15  Intake/Output   Yesterday:  10/15 0701 - 10/16 0700 In: -  Out: 1900 [Urine:1900] This shift:  Total I/O In: -  Out: 300 [Urine:300]  Bowel function:  Flatus: y  BM: n  Drain: n/a  Physical Exam:  General: Pt awake/alert/oriented x4 in no acute distress Eyes: PERRL, normal EOM.  Sclera clear.  No icterus Neuro: CN II-XII intact w/o focal sensory/motor deficits. Lymph: No head/neck/groin lymphadenopathy Psych:  No delerium/psychosis/paranoia HENT: Normocephalic, Mucus membranes moist.  No thrush Neck: Supple, No tracheal deviation Chest: No chest wall pain w good excursion CV:  Pulses intact.  Regular rhythm MS: Normal AROM mjr joints.  No obvious deformity Abdomen: Soft.  Morbidly obese.  Nondistended.  Mildly tender at RUQ only.  No Murphy's sign.  No evidence of peritonitis.  No incarcerated hernias. Ext:  SCDs BLE.  No mjr edema.  No cyanosis Skin: No petechiae / purpura  Results:   Labs: Results for orders placed or performed during the hospital encounter of 05/09/15 (from the past 48 hour(s))  CBC     Status: None   Collection Time: 05/12/15  4:00 AM  Result Value Ref Range   WBC 10.3 4.0  - 10.5 K/uL   RBC 4.27 3.87 - 5.11 MIL/uL   Hemoglobin 12.7 12.0 - 15.0 g/dL   HCT 39.2 36.0 - 46.0 %   MCV 91.8 78.0 - 100.0 fL   MCH 29.7 26.0 - 34.0 pg   MCHC 32.4 30.0 - 36.0 g/dL   RDW 13.4 11.5 - 15.5 %   Platelets 220 150 - 400 K/uL  Basic metabolic panel     Status: Abnormal   Collection Time: 05/12/15  4:00 AM  Result Value Ref Range   Sodium 133 (L) 135 - 145 mmol/L   Potassium 4.0 3.5 - 5.1 mmol/L   Chloride 96 (L) 101 - 111 mmol/L   CO2 28 22 - 32 mmol/L   Glucose, Bld 114 (H) 65 - 99 mg/dL   BUN 9 6 - 20 mg/dL   Creatinine, Ser 0.80 0.44 - 1.00 mg/dL   Calcium 9.4 8.9 - 10.3 mg/dL   GFR calc non Af Amer >60 >60 mL/min   GFR calc Af Amer >60 >60 mL/min    Comment: (NOTE) The eGFR has been calculated using the CKD EPI equation. This calculation has not been validated in all clinical situations. eGFR's persistently <60 mL/min signify possible Chronic Kidney Disease.    Anion gap 9 5 - 15  Basic metabolic panel     Status: Abnormal   Collection Time: 05/13/15  4:19 AM  Result Value Ref Range   Sodium 140 135 - 145 mmol/L    Comment: DELTA CHECK NOTED   Potassium 3.3 (L) 3.5 - 5.1 mmol/L    Comment: DELTA CHECK NOTED   Chloride 99 (L) 101 - 111 mmol/L   CO2 32 22 - 32 mmol/L   Glucose, Bld 124 (H) 65 - 99 mg/dL   BUN <5 (L) 6 - 20 mg/dL   Creatinine, Ser 0.66 0.44 - 1.00 mg/dL   Calcium 9.5 8.9 - 10.3 mg/dL   GFR calc non Af Amer >60 >60 mL/min   GFR calc Af Amer >60 >60 mL/min    Comment: (NOTE) The eGFR has been calculated using the CKD EPI equation. This calculation has not been validated in all clinical situations. eGFR's persistently <60 mL/min signify possible Chronic Kidney Disease.    Anion gap 9 5 - 15    Imaging / Studies: Nm Myocar Multi W/spect W/wall Motion / Ef  05/12/2015  CLINICAL DATA:  Precordial chest pain EXAM: MYOCARDIAL IMAGING WITH SPECT (REST AND PHARMACOLOGIC-STRESS) GATED LEFT VENTRICULAR WALL MOTION STUDY LEFT VENTRICULAR  EJECTION FRACTION TECHNIQUE: Standard myocardial SPECT imaging was performed after resting intravenous injection of 10 mCi Tc-24msestamibi. Subsequently, intravenous infusion of Lexiscan was performed under the supervision of the Cardiology staff. At peak effect of the drug, 30 mCi Tc-939mestamibi was injected intravenously and standard myocardial SPECT imaging was performed. Quantitative gated imaging was also performed to evaluate left ventricular wall motion, and estimate left ventricular ejection fraction. COMPARISON:  None. FINDINGS: Perfusion: No decreased activity in the left ventricle on stress imaging to suggest  reversible ischemia or infarction. Wall Motion: Normal left ventricular wall motion. No left ventricular dilation. Left Ventricular Ejection Fraction: 67 % End diastolic volume 97 ml End systolic volume 32 ml IMPRESSION: 1. No reversible ischemia or infarction. 2. Normal left ventricular wall motion. 3. Left ventricular ejection fraction 67% 4. Low-risk stress test findings*. *2012 Appropriate Use Criteria for Coronary Revascularization Focused Update: J Am Coll Cardiol. 5146;04(7):998-721. http://content.[email or web address].aspx?articleid=[email or web address] Electronically Signed   By: Julian Hy M.D.   On: 05/12/2015 13:06   Dg Chest Port 1 View  05/11/2015  CLINICAL DATA:  69 year old female with acute cough today. EXAM: PORTABLE CHEST 1 VIEW COMPARISON:  05/09/2015 and prior radiographs FINDINGS: Upper limits normal heart size again identified. Minimal subsegmental bibasilar atelectasis noted. There is no evidence of focal airspace disease, pulmonary edema, suspicious pulmonary nodule/mass, pleural effusion, or pneumothorax. No acute bony abnormalities are identified. A remote proximal left humeral fracture is again noted. Lower cervical spine surgical hardware and thoracic neurostimulator again identified IMPRESSION: Minimal subsegmental bibasilar atelectasis. Electronically Signed   By:  Margarette Canada M.D.   On: 05/11/2015 12:59    Medications / Allergies: per chart  Antibiotics: Anti-infectives    None        Note: Portions of this report may have been transcribed using voice recognition software. Every effort was made to ensure accuracy; however, inadvertent computerized transcription errors may be present.   Any transcriptional errors that result from this process are unintentional.     Adin Hector, M.D., F.A.C.S. Gastrointestinal and Minimally Invasive Surgery [facility] [address] [place], [place] [address] [phone] Main / Paging   05/13/2015  CARE TEAM:  PCP: [name], MD  Outpatient Care Team: Patient Care Team: [name], MD as PCP - General (Internal Medicine)  Inpatient Treatment Team: Treatment Team: Attending Provider: Albertine Patricia, MD; Consulting Physician: Nolon Nations, MD; Registered Nurse: Mortimer Fries, RN; Technician: Sueanne Margarita, NT; Consulting Physician: Michae Kava Lbcardiology, MD; Rounding Team: Loura Back, MD; Technician: Williams Che, NT; Physical Therapist: Ramond Dial, PT; Registered Nurse: Delman Kitten, RN

## 2015-05-13 NOTE — Progress Notes (Signed)
Patient Demographics  Shirley Leblanc, is a 69 y.o. female, DOB - 1946/04/01, SWN:[id number]  Admit date - 05/09/2015   Admitting Physician Charlynne Cousins, MD  Outpatient Primary MD for the patient is [name], MD  LOS - 4   Chief Complaint  Patient presents with  . Cholelithiasis  . Nausea  . Emesis       Admission HPI/Brief narrative: 69 y/o female presents with  2 week hx of RUQ pain, CT abd /pelvis significant for gallstone, and by surgery, pain consistent with pericolic, cardiology following patient for preoperative clearance, [facility]13 for planned 2 days stress test prior to surgery, the stress test with no evidence of ischemia, patient is intermediate risk for an intermediate risk procedure, the plan is for laparoscopic cholecystectomy in a.m.  Subjective:   Shirley Leblanc today has, No headache, No chest pain, No Nausea, No new weakness tingling or numbness, No Cough - SOB., Complaining of abdominal pain.  Assessment & Plan    Active Problems:   RUQ pain   Essential hypertension   Epigastric abdominal pain   Precordial chest pain  Right upper quadrant pain secondary to be antecolic - Workup Significant for cholelithiasis - Cardiology consult appreciated, had nuclear stress test for preop evaluation, stress test shows no evidence of ischemia with an EF of 67% and no wall motion abnormalities, patient is intermediate risk for an intermediate risk procedures, plan is for laparoscopic cholecystectomy by surgery in a.m.Marland Kitchen - Continue with when necessary pain and nausea medication.  Hypertension - Acceptable, will hold lisinopril tomorrow before   surgery , will discontinue hydrochlorothiazide giving sodium was trending down gradually.  Hyperlipidemia - Continue with statin  Hypothyroidism - Continue with Synthroid  Cough with productive sputum  -  Secondary to bibasilar atelectasis as evident on chest x-ray , continue with Mucinex, and incentive spirometry, and ambulate.  Hyponatremia - Mild, will discontinue hydrochlorothiazide, will discontinue D5 half-normal saline,  - Resolved  Hypokalemia - Repleted, recheck in a.m.  Code Status: Full  Family Communication: none at bedside  Disposition Plan: Pending further workup   Procedures  Nuclear stress test   Consults   Gen. Surgery Cardiology   Medications  Scheduled Meds: . antiseptic oral rinse  7 mL Mouth Rinse BID  . aspirin EC  81 mg Oral Daily  . atorvastatin  10 mg Oral Daily  . chlorhexidine  1 application Topical Once  . [START ON 05/14/2015] chlorhexidine  1 application Topical Once  . cholecalciferol  2,000 Units Oral Daily  . metronidazole  500 mg Intravenous On Call to OR   And  . ciprofloxacin  400 mg Intravenous On Call to OR  . guaiFENesin  1,200 mg Oral BID  . heparin  5,000 Units Subcutaneous 3 times per day  . levothyroxine  50 mcg Oral QAC breakfast  . lisinopril  40 mg Oral Daily  . pantoprazole (PROTONIX) IV  40 mg Intravenous Q12H  . sertraline  200 mg Oral Daily   Continuous Infusions:   PRN Meds:.clonazePAM, HYDROcodone-acetaminophen, HYDROmorphone (DILAUDID) injection, Influenza vac split quadrivalent PF, ondansetron **OR** ondansetron (ZOFRAN) IV, polyethylene glycol  DVT Prophylaxis  heparin  Lab Results  Component Value Date  PLT 220 05/12/2015    Antibiotics    Anti-infectives    Start     Dose/Rate Route Frequency Ordered Stop   05/13/15 0900  metroNIDAZOLE (FLAGYL) IVPB 500 mg     500 mg 100 mL/hr over 60 Minutes Intravenous On call to O.R. 05/13/15 0845 05/14/15 0559   05/13/15 0900  ciprofloxacin (CIPRO) IVPB 400 mg     400 mg 200 mL/hr over 60 Minutes Intravenous On call to O.R. 05/13/15 0845 05/14/15 0559          Objective:   Filed Vitals:   05/12/15 1103 05/12/15 2001 05/13/15 0407 05/13/15 1109  BP:  101/51 123/76 124/81 131/74  Pulse: 76 75 82   Temp:  97.7 F (36.5 C) 97.7 F (36.5 C)   TempSrc:  Oral Oral   Resp:  18 18   Height:      Weight:      SpO2:  92% 92%     Wt Readings from Last 3 Encounters:  05/10/15 123.832 kg (273 lb)  03/27/11 125.646 kg (277 lb)  03/14/11 125.193 kg (276 lb)     Intake/Output Summary (Last 24 hours) at 05/13/15 1134 Last data filed at 05/13/15 0715  Gross per 24 hour  Intake      0 ml  Output   1200 ml  Net  -1200 ml     Physical Exam  Awake Alert, Oriented X 3, [place].AT,PERRAL Supple Neck,No JVD,  Symmetrical Chest wall movement, Good air movement bilaterally,  RRR,No Gallops,Rubs or new Murmurs, No Parasternal Heave +ve B.Sounds, Abd Soft, mild right upper quadrant tenderness,No rebound - guarding or rigidity. No Cyanosis, Clubbing or edema, No new Rash or bruise     Data Review   Micro Results No results found for this or any previous visit (from the past 240 hour(s)).  Radiology Reports Ct Abdomen Pelvis Wo Contrast  05/09/2015  CLINICAL DATA:  Right side abdominal pain, nausea, vomiting for 2 weeks. Gallstones. EXAM: CT ABDOMEN AND PELVIS WITHOUT CONTRAST TECHNIQUE: Multidetector CT imaging of the abdomen and pelvis was performed following the standard protocol without IV contrast. COMPARISON:  Ultrasound 05/09/2015 FINDINGS: Lung bases are clear.  No effusions.  Heart is normal size. Mild elevation of the right hemidiaphragm. Mild fatty infiltration of the liver without focal abnormality. Small gallstone noted layering within the gallbladder. Spleen, pancreas, adrenals and kidneys have an unremarkable unenhanced appearance. No renal or ureteral stones. No hydronephrosis. 14 mm calcified splenic artery aneurysm in the left upper quadrant. Small duodenal diverticulum off the second portion of the duodenum. Stomach and small bowel are decompressed. Appendix not visualized. Large bowel unremarkable. No free fluid, free air or  adenopathy. Prior hysterectomy. No adnexal masses. Urinary bladder is decompressed, grossly unremarkable. Aortic and iliac calcifications without aneurysm. No acute bony abnormality or focal bone lesion. Degenerative disc and facet disease throughout the lumbar spine. Mild levoscoliosis. IMPRESSION: No evidence of renal or ureteral stones.  No hydronephrosis. No acute findings in the abdomen or pelvis. Fatty liver. For 2 mm calcified splenic artery aneurysm. Electronically Signed   By: Rolm Baptise M.D.   On: 05/09/2015 12:29   Dg Chest 2 View  05/09/2015  CLINICAL DATA:  Shortness of breath and chest pain for 3 month EXAM: CHEST  2 VIEW COMPARISON:  Chest radiograph April 25, 2015; chest CT May 02, 2015 FINDINGS: There is mild scarring in the left lower lobe. Lungs elsewhere clear. Heart size and pulmonary vascularity are normal. No adenopathy. No  pneumothorax. There is a thoracic stimulator with the tip posteriorly at the mid thoracic level. There is postoperative change in the lower cervical spine. There is degenerative change in the thoracic spine. IMPRESSION: No edema or consolidation.  Slight scarring left lower lobe. Electronically Signed   By: Lowella Grip III M.D.   On: 05/09/2015 15:53   Dg Chest 2 View  04/25/2015  CLINICAL DATA:  ongoing intermittent chest pain for 6 months EXAM: CHEST  2 VIEW COMPARISON:  Radiograph 08/16/2014 FINDINGS: Anterior cervical fixation noted. Stimulation electrodes in the lower thoracic spine central canal. Normal cardiac silhouette with ectatic aorta. Central venous pulmonary pulmonary congestion is present. No pulmonary edema. Small RIGHT effusion. IMPRESSION: Central venous congestion small RIGHT effusion. Electronically Signed   By: Suzy Bouchard M.D.   On: 04/25/2015 13:00   Ct Chest Wo Contrast  05/02/2015  CLINICAL DATA:  Shortness of breath, mid chest pain intermittently, productive cough for 2 weeks, small RIGHT pleural effusion on recent  chest radiograph EXAM: CT CHEST WITHOUT CONTRAST TECHNIQUE: Multidetector CT imaging of the chest was performed following the standard protocol without IV contrast. COMPARISON:  01/18/2014 CT chest, chest radiograph 04/25/2015 FINDINGS: Scattered atherosclerotic calcifications aorta, coronary arteries and minimally proximal great vessels. No thoracic adenopathy. Minimal dependent density within gallbladder question tiny gallstones. Small hiatal hernia questioned. Calcified splenic artery aneurysm 15 x 12 mm unchanged. Intraspinal stimulator. Lungs clear. No pulmonary infiltrate, pleural effusion or pneumothorax. Mild elevation of RIGHT diaphragm and prominent epicardial fat pads noted. Mild degenerative disc disease changes thoracic spine with evidence of prior cervical spine fusion. IMPRESSION: Small hiatal hernia. Scattered calcified atherosclerotic plaque including a small calcified splenic artery aneurysm. No acute intra thoracic abnormalities. Suspect minimal cholelithiasis. Electronically Signed   By: Lavonia Dana M.D.   On: 05/02/2015 09:17   Nm Myocar Multi W/spect W/wall Motion / Ef  05/12/2015  CLINICAL DATA:  Precordial chest pain EXAM: MYOCARDIAL IMAGING WITH SPECT (REST AND PHARMACOLOGIC-STRESS) GATED LEFT VENTRICULAR WALL MOTION STUDY LEFT VENTRICULAR EJECTION FRACTION TECHNIQUE: Standard myocardial SPECT imaging was performed after resting intravenous injection of 10 mCi Tc-81m sestamibi. Subsequently, intravenous infusion of Lexiscan was performed under the supervision of the Cardiology staff. At peak effect of the drug, 30 mCi Tc-1m sestamibi was injected intravenously and standard myocardial SPECT imaging was performed. Quantitative gated imaging was also performed to evaluate left ventricular wall motion, and estimate left ventricular ejection fraction. COMPARISON:  None. FINDINGS: Perfusion: No decreased activity in the left ventricle on stress imaging to suggest reversible ischemia or  infarction. Wall Motion: Normal left ventricular wall motion. No left ventricular dilation. Left Ventricular Ejection Fraction: 67 % End diastolic volume 97 ml End systolic volume 32 ml IMPRESSION: 1. No reversible ischemia or infarction. 2. Normal left ventricular wall motion. 3. Left ventricular ejection fraction 67% 4. Low-risk stress test findings*. *2012 Appropriate Use Criteria for Coronary Revascularization Focused Update: J Am Coll Cardiol. 1497;02(6):378-588. http://content.[email or web address].aspx?articleid=[email or web address] Electronically Signed   By: Julian Hy M.D.   On: 05/12/2015 13:06   Dg Chest Port 1 View  05/11/2015  CLINICAL DATA:  69 year old female with acute cough today. EXAM: PORTABLE CHEST 1 VIEW COMPARISON:  05/09/2015 and prior radiographs FINDINGS: Upper limits normal heart size again identified. Minimal subsegmental bibasilar atelectasis noted. There is no evidence of focal airspace disease, pulmonary edema, suspicious pulmonary nodule/mass, pleural effusion, or pneumothorax. No acute bony abnormalities are identified. A remote proximal left humeral fracture is again noted. Lower cervical spine surgical hardware and thoracic  neurostimulator again identified IMPRESSION: Minimal subsegmental bibasilar atelectasis. Electronically Signed   By: Margarette Canada M.D.   On: 05/11/2015 12:59   US Abdomen Limited Ruq  05/09/2015  CLINICAL DATA:  Vomiting for 1 month.  Hypertension. EXAM: US ABDOMEN LIMITED - RIGHT UPPER QUADRANT COMPARISON:  11/15/2010 FINDINGS: Gallbladder: Echogenic mobile 1 cm gallstone in the gallbladder. No gallbladder wall thickening or sonographic Murphy sign. No pericholecystic fluid. Common bile duct: Diameter: 4 mm, within normal limits. Liver: Coarse echogenic liver with poor sonic penetration compatible with diffuse hepatic steatosis. IMPRESSION: 1. Cholelithiasis. Sonographic Murphy's sign absent; no gallbladder wall thickening. 2. Coarse echogenic liver with poor  sonic penetration compatible with diffuse hepatic steatosis. Electronically Signed   By: Van Clines M.D.   On: 05/09/2015 10:44     CBC  Recent Labs Lab 05/09/15 1047 05/10/15 0515 05/12/15 0400  WBC 9.7 7.6 10.3  HGB 13.9 12.9 12.7  HCT 41.6 40.0 39.2  PLT 212 212 220  MCV 90.6 92.2 91.8  MCH 30.3 29.7 29.7  MCHC 33.4 32.3 32.4  RDW 12.9 13.1 13.4    Chemistries   Recent Labs Lab 05/09/15 1047 05/10/15 0515 05/12/15 0400 05/13/15 0419  NA 138 135 133* 140  K 3.8 4.0 4.0 3.3*  CL 104 102 96* 99*  CO2 26 27 28  32  GLUCOSE 131* 118* 114* 124*  BUN 14 13 9  <5*  CREATININE 0.55 0.69 0.80 0.66  CALCIUM 9.4 9.0 9.4 9.5  AST 20 19  --   --   ALT 17 17  --   --   ALKPHOS 85 75  --   --   BILITOT 0.7 0.5  --   --    ------------------------------------------------------------------------------------------------------------------ estimated creatinine clearance is 89.2 mL/min (by C-G formula based on Cr of 0.66). ------------------------------------------------------------------------------------------------------------------ No results for input(s): HGBA1C in the last 72 hours. ------------------------------------------------------------------------------------------------------------------ No results for input(s): CHOL, HDL, LDLCALC, TRIG, CHOLHDL, LDLDIRECT in the last 72 hours. ------------------------------------------------------------------------------------------------------------------ No results for input(s): TSH, T4TOTAL, T3FREE, THYROIDAB in the last 72 hours.  Invalid input(s): FREET3 ------------------------------------------------------------------------------------------------------------------ No results for input(s): VITAMINB12, FOLATE, FERRITIN, TIBC, IRON, RETICCTPCT in the last 72 hours.  Coagulation profile No results for input(s): INR, PROTIME in the last 168 hours.  No results for input(s): DDIMER in the last 72 hours.  Cardiac  Enzymes No results for input(s): CKMB, TROPONINI, MYOGLOBIN in the last 168 hours.  Invalid input(s): CK ------------------------------------------------------------------------------------------------------------------ Invalid input(s): POCBNP     Time Spent in minutes   35 minutes   Townsend Cudworth M.D on 05/13/2015 at 11:34 AM  Between 7am to 7pm - Pager - [phone]  After 7pm go to [email or web address] - password [facility]  [facility]   Office  [phone]

## 2015-05-14 ENCOUNTER — Observation Stay (HOSPITAL_COMMUNITY): Payer: Medicare Other | Admitting: Critical Care Medicine

## 2015-05-14 ENCOUNTER — Encounter (HOSPITAL_COMMUNITY)
Admission: EM | Disposition: A | Discharge status: Home Health | Payer: Self-pay | Source: Home / Self Care | Attending: Internal Medicine

## 2015-05-14 ENCOUNTER — Encounter (HOSPITAL_COMMUNITY): Payer: Self-pay | Admitting: Critical Care Medicine

## 2015-05-14 DIAGNOSIS — K819 Cholecystitis, unspecified: Secondary | ICD-10-CM | POA: Diagnosis not present

## 2015-05-14 DIAGNOSIS — K8000 Calculus of gallbladder with acute cholecystitis without obstruction: Secondary | ICD-10-CM | POA: Diagnosis not present

## 2015-05-14 DIAGNOSIS — J9601 Acute respiratory failure with hypoxia: Secondary | ICD-10-CM | POA: Diagnosis not present

## 2015-05-14 DIAGNOSIS — I5032 Chronic diastolic (congestive) heart failure: Secondary | ICD-10-CM | POA: Diagnosis not present

## 2015-05-14 DIAGNOSIS — F329 Major depressive disorder, single episode, unspecified: Secondary | ICD-10-CM | POA: Diagnosis not present

## 2015-05-14 DIAGNOSIS — E785 Hyperlipidemia, unspecified: Secondary | ICD-10-CM | POA: Diagnosis not present

## 2015-05-14 DIAGNOSIS — K219 Gastro-esophageal reflux disease without esophagitis: Secondary | ICD-10-CM | POA: Diagnosis not present

## 2015-05-14 DIAGNOSIS — I10 Essential (primary) hypertension: Secondary | ICD-10-CM | POA: Diagnosis not present

## 2015-05-14 DIAGNOSIS — J9811 Atelectasis: Secondary | ICD-10-CM | POA: Diagnosis not present

## 2015-05-14 DIAGNOSIS — R1011 Right upper quadrant pain: Secondary | ICD-10-CM | POA: Diagnosis not present

## 2015-05-14 DIAGNOSIS — E871 Hypo-osmolality and hyponatremia: Secondary | ICD-10-CM | POA: Diagnosis not present

## 2015-05-14 DIAGNOSIS — K8010 Calculus of gallbladder with chronic cholecystitis without obstruction: Secondary | ICD-10-CM | POA: Diagnosis not present

## 2015-05-14 DIAGNOSIS — Z23 Encounter for immunization: Secondary | ICD-10-CM | POA: Diagnosis not present

## 2015-05-14 DIAGNOSIS — I110 Hypertensive heart disease with heart failure: Secondary | ICD-10-CM | POA: Diagnosis not present

## 2015-05-14 DIAGNOSIS — Z6841 Body Mass Index (BMI) 40.0 and over, adult: Secondary | ICD-10-CM | POA: Diagnosis not present

## 2015-05-14 DIAGNOSIS — K8020 Calculus of gallbladder without cholecystitis without obstruction: Secondary | ICD-10-CM | POA: Diagnosis not present

## 2015-05-14 HISTORY — PX: CHOLECYSTECTOMY: SHX55

## 2015-05-14 LAB — CBC
HEMATOCRIT: 36.7 % (ref 36.0–46.0)
HEMOGLOBIN: 12 g/dL (ref 12.0–15.0)
MCH: 29.9 pg (ref 26.0–34.0)
MCHC: 32.7 g/dL (ref 30.0–36.0)
MCV: 91.3 fL (ref 78.0–100.0)
Platelets: 177 10*3/uL (ref 150–400)
RBC: 4.02 MIL/uL (ref 3.87–5.11)
RDW: 12.9 % (ref 11.5–15.5)
WBC: 7.4 10*3/uL (ref 4.0–10.5)

## 2015-05-14 LAB — BASIC METABOLIC PANEL
ANION GAP: 8 (ref 5–15)
BUN: 5 mg/dL — ABNORMAL LOW (ref 6–20)
CHLORIDE: 99 mmol/L — AB (ref 101–111)
CO2: 33 mmol/L — AB (ref 22–32)
Calcium: 9.4 mg/dL (ref 8.9–10.3)
Creatinine, Ser: 0.63 mg/dL (ref 0.44–1.00)
GFR calc non Af Amer: >60 mL/min (ref >60)
Glucose, Bld: 103 mg/dL — ABNORMAL HIGH (ref 65–99)
POTASSIUM: 3.6 mmol/L (ref 3.5–5.1)
Sodium: 140 mmol/L (ref 135–145)

## 2015-05-14 SURGERY — LAPAROSCOPIC CHOLECYSTECTOMY
Anesthesia: General | Site: Abdomen

## 2015-05-14 MED ORDER — LACTATED RINGERS IV SOLN
INTRAVENOUS | Status: DC
  Administered 2015-05-14 (×2): via INTRAVENOUS

## 2015-05-14 MED ORDER — CHLORHEXIDINE GLUCONATE 4 % EX LIQD
CUTANEOUS | Status: AC
  Administered 2015-05-14: 05:00:00
  Filled 2015-05-14: qty 15

## 2015-05-14 MED ORDER — PHENYLEPHRINE 40 MCG/ML (10ML) SYRINGE FOR IV PUSH (FOR BLOOD PRESSURE SUPPORT)
PREFILLED_SYRINGE | INTRAVENOUS | Status: AC
Start: 2015-05-14 — End: 2015-05-14
  Filled 2015-05-14: qty 10

## 2015-05-14 MED ORDER — BUPIVACAINE-EPINEPHRINE 0.25% -1:200000 IJ SOLN
INTRAMUSCULAR | Status: DC | PRN
  Administered 2015-05-14: 20 mL

## 2015-05-14 MED ORDER — METRONIDAZOLE IN NACL 5-0.79 MG/ML-% IV SOLN
500.0000 mg | INTRAVENOUS | Status: AC
  Administered 2015-05-14: 500 mg via INTRAVENOUS
  Filled 2015-05-14: qty 100

## 2015-05-14 MED ORDER — CIPROFLOXACIN IN D5W 400 MG/200ML IV SOLN
INTRAVENOUS | Status: AC
  Administered 2015-05-14: 400 mg via INTRAVENOUS
  Filled 2015-05-14: qty 200

## 2015-05-14 MED ORDER — PROPOFOL 10 MG/ML IV BOLUS
INTRAVENOUS | Status: DC | PRN
  Administered 2015-05-14: 30 mg via INTRAVENOUS
  Administered 2015-05-14: 130 mg via INTRAVENOUS

## 2015-05-14 MED ORDER — DEXAMETHASONE SODIUM PHOSPHATE 4 MG/ML IJ SOLN
INTRAMUSCULAR | Status: DC | PRN
  Administered 2015-05-14: 4 mg via INTRAVENOUS

## 2015-05-14 MED ORDER — HYDROMORPHONE HCL 1 MG/ML IJ SOLN
1.0000 mg | INTRAMUSCULAR | Status: DC | PRN
  Administered 2015-05-14 – 2015-05-15 (×4): 1 mg via INTRAVENOUS
  Filled 2015-05-14 (×4): qty 1

## 2015-05-14 MED ORDER — LIDOCAINE HCL (CARDIAC) 20 MG/ML IV SOLN
INTRAVENOUS | Status: AC
  Filled 2015-05-14: qty 5

## 2015-05-14 MED ORDER — SUCCINYLCHOLINE CHLORIDE 20 MG/ML IJ SOLN
INTRAMUSCULAR | Status: DC | PRN
  Administered 2015-05-14: 80 mg via INTRAVENOUS

## 2015-05-14 MED ORDER — FENTANYL CITRATE (PF) 100 MCG/2ML IJ SOLN
INTRAMUSCULAR | Status: DC | PRN
  Administered 2015-05-14: 100 ug via INTRAVENOUS
  Administered 2015-05-14 (×3): 50 ug via INTRAVENOUS

## 2015-05-14 MED ORDER — ACETAMINOPHEN 160 MG/5ML PO SOLN
325.0000 mg | ORAL | Status: DC | PRN
  Filled 2015-05-14: qty 20.3

## 2015-05-14 MED ORDER — FENTANYL CITRATE (PF) 100 MCG/2ML IJ SOLN
25.0000 ug | INTRAMUSCULAR | Status: DC | PRN
  Administered 2015-05-14 (×4): 25 ug via INTRAVENOUS

## 2015-05-14 MED ORDER — ONDANSETRON HCL 4 MG/2ML IJ SOLN
INTRAMUSCULAR | Status: AC
  Filled 2015-05-14: qty 4

## 2015-05-14 MED ORDER — SODIUM CHLORIDE 0.9 % IR SOLN
Status: DC | PRN
  Administered 2015-05-14: 1000 mL

## 2015-05-14 MED ORDER — GLYCOPYRROLATE 0.2 MG/ML IJ SOLN
INTRAMUSCULAR | Status: AC
  Filled 2015-05-14: qty 4

## 2015-05-14 MED ORDER — ROCURONIUM BROMIDE 50 MG/5ML IV SOLN
INTRAVENOUS | Status: AC
  Filled 2015-05-14: qty 1

## 2015-05-14 MED ORDER — 0.9 % SODIUM CHLORIDE (POUR BTL) OPTIME
TOPICAL | Status: DC | PRN
  Administered 2015-05-14: 1000 mL

## 2015-05-14 MED ORDER — MIDAZOLAM HCL 5 MG/5ML IJ SOLN
INTRAMUSCULAR | Status: DC | PRN
  Administered 2015-05-14: 2 mg via INTRAVENOUS

## 2015-05-14 MED ORDER — NEOSTIGMINE METHYLSULFATE 10 MG/10ML IV SOLN
INTRAVENOUS | Status: DC | PRN
  Administered 2015-05-14: 4 mg via INTRAVENOUS

## 2015-05-14 MED ORDER — FENTANYL CITRATE (PF) 100 MCG/2ML IJ SOLN
INTRAMUSCULAR | Status: AC
  Filled 2015-05-14: qty 2

## 2015-05-14 MED ORDER — LIDOCAINE HCL (CARDIAC) 20 MG/ML IV SOLN
INTRAVENOUS | Status: DC | PRN
  Administered 2015-05-14: 80 mg via INTRAVENOUS

## 2015-05-14 MED ORDER — DEXAMETHASONE SODIUM PHOSPHATE 4 MG/ML IJ SOLN
INTRAMUSCULAR | Status: AC
  Filled 2015-05-14: qty 3

## 2015-05-14 MED ORDER — NEOSTIGMINE METHYLSULFATE 10 MG/10ML IV SOLN
INTRAVENOUS | Status: AC
Start: 2015-05-14 — End: 2015-05-14
  Filled 2015-05-14: qty 1

## 2015-05-14 MED ORDER — ACETAMINOPHEN 325 MG PO TABS: 325.0000 mg | ORAL_TABLET | ORAL | Status: DC | PRN

## 2015-05-14 MED ORDER — MIDAZOLAM HCL 2 MG/2ML IJ SOLN
INTRAMUSCULAR | Status: AC
  Filled 2015-05-14: qty 4

## 2015-05-14 MED ORDER — BUPIVACAINE-EPINEPHRINE (PF) 0.25% -1:200000 IJ SOLN
INTRAMUSCULAR | Status: AC
  Filled 2015-05-14: qty 30

## 2015-05-14 MED ORDER — ESMOLOL HCL 10 MG/ML IV SOLN
INTRAVENOUS | Status: DC | PRN
  Administered 2015-05-14: 30 mg via INTRAVENOUS

## 2015-05-14 MED ORDER — FENTANYL CITRATE (PF) 250 MCG/5ML IJ SOLN
INTRAMUSCULAR | Status: AC
  Filled 2015-05-14: qty 5

## 2015-05-14 MED ORDER — PROPOFOL 10 MG/ML IV BOLUS
INTRAVENOUS | Status: AC
Start: 2015-05-14 — End: 2015-05-14
  Filled 2015-05-14: qty 20

## 2015-05-14 MED ORDER — OXYCODONE HCL 5 MG/5ML PO SOLN
5.0000 mg | Freq: Once | ORAL | Status: DC | PRN
Start: 2015-05-14 — End: 2015-05-14

## 2015-05-14 MED ORDER — ONDANSETRON HCL 4 MG/2ML IJ SOLN
INTRAMUSCULAR | Status: DC | PRN
  Administered 2015-05-14: 4 mg via INTRAVENOUS

## 2015-05-14 MED ORDER — ALBUTEROL SULFATE HFA 108 (90 BASE) MCG/ACT IN AERS
INHALATION_SPRAY | RESPIRATORY_TRACT | Status: AC
Start: 2015-05-14 — End: 2015-05-14
  Filled 2015-05-14: qty 6.7

## 2015-05-14 MED ORDER — ALBUTEROL SULFATE HFA 108 (90 BASE) MCG/ACT IN AERS
INHALATION_SPRAY | RESPIRATORY_TRACT | Status: DC | PRN
  Administered 2015-05-14 (×2): 2 via RESPIRATORY_TRACT

## 2015-05-14 MED ORDER — GLYCOPYRROLATE 0.2 MG/ML IJ SOLN
INTRAMUSCULAR | Status: DC | PRN
  Administered 2015-05-14: .8 mg via INTRAVENOUS

## 2015-05-14 MED ORDER — OXYCODONE HCL 5 MG PO TABS
5.0000 mg | ORAL_TABLET | Freq: Once | ORAL | Status: DC | PRN
Start: 2015-05-14 — End: 2015-05-14

## 2015-05-14 MED ORDER — ROCURONIUM BROMIDE 100 MG/10ML IV SOLN
INTRAVENOUS | Status: DC | PRN
  Administered 2015-05-14 (×2): 10 mg via INTRAVENOUS

## 2015-05-14 SURGICAL SUPPLY — 41 items
APPLIER CLIP 5 13 M/L LIGAMAX5 (MISCELLANEOUS) ×3
APR CLP MED LRG 5 ANG JAW (MISCELLANEOUS) ×2
BAG SPEC RTRVL LRG 6X4 10 (ENDOMECHANICALS) ×2
BLADE SURG CLIPPER 3M 9600 (MISCELLANEOUS) IMPLANT
CANISTER SUCTION 2500CC (MISCELLANEOUS) ×3 IMPLANT
CHLORAPREP W/TINT 26ML (MISCELLANEOUS) ×3 IMPLANT
CLIP APPLIE 5 13 M/L LIGAMAX5 (MISCELLANEOUS) ×2 IMPLANT
COVER MAYO STAND STRL (DRAPES) ×1 IMPLANT
COVER SURGICAL LIGHT HANDLE (MISCELLANEOUS) ×3 IMPLANT
DRAPE C-ARM 42X72 X-RAY (DRAPES) ×1 IMPLANT
ELECT REM PT RETURN 9FT ADLT (ELECTROSURGICAL) ×3
ELECTRODE REM PT RTRN 9FT ADLT (ELECTROSURGICAL) ×2 IMPLANT
GLOVE BIO SURGEON STRL SZ7 (GLOVE) ×2 IMPLANT
GLOVE BIOGEL PI IND STRL 7.0 (GLOVE) ×1 IMPLANT
GLOVE BIOGEL PI IND STRL 7.5 (GLOVE) ×2 IMPLANT
GLOVE BIOGEL PI INDICATOR 7.0 (GLOVE) ×1
GLOVE BIOGEL PI INDICATOR 7.5 (GLOVE) ×2
GLOVE ECLIPSE 7.5 STRL STRAW (GLOVE) ×2 IMPLANT
GLOVE SURG SIGNA 7.5 PF LTX (GLOVE) ×3 IMPLANT
GOWN STRL REUS W/ TWL LRG LVL3 (GOWN DISPOSABLE) ×4 IMPLANT
GOWN STRL REUS W/ TWL XL LVL3 (GOWN DISPOSABLE) ×2 IMPLANT
GOWN STRL REUS W/TWL LRG LVL3 (GOWN DISPOSABLE) ×6
GOWN STRL REUS W/TWL XL LVL3 (GOWN DISPOSABLE) ×3
KIT BASIN OR (CUSTOM PROCEDURE TRAY) ×3 IMPLANT
KIT ROOM TURNOVER OR (KITS) ×3 IMPLANT
LIQUID BAND (GAUZE/BANDAGES/DRESSINGS) ×6 IMPLANT
NS IRRIG 1000ML POUR BTL (IV SOLUTION) ×3 IMPLANT
PAD ARMBOARD 7.5X6 YLW CONV (MISCELLANEOUS) ×3 IMPLANT
POUCH SPECIMEN RETRIEVAL 10MM (ENDOMECHANICALS) ×3 IMPLANT
SCISSORS LAP 5X35 DISP (ENDOMECHANICALS) ×3 IMPLANT
SET CHOLANGIOGRAPH 5 50 .035 (SET/KITS/TRAYS/PACK) ×1 IMPLANT
SET IRRIG TUBING LAPAROSCOPIC (IRRIGATION / IRRIGATOR) ×3 IMPLANT
SLEEVE ENDOPATH XCEL 5M (ENDOMECHANICALS) ×6 IMPLANT
SPECIMEN JAR SMALL (MISCELLANEOUS) ×3 IMPLANT
SUT MON AB 4-0 PC3 18 (SUTURE) ×3 IMPLANT
TOWEL OR 17X24 6PK STRL BLUE (TOWEL DISPOSABLE) ×3 IMPLANT
TOWEL OR 17X26 10 PK STRL BLUE (TOWEL DISPOSABLE) ×3 IMPLANT
TRAY LAPAROSCOPIC MC (CUSTOM PROCEDURE TRAY) ×3 IMPLANT
TROCAR XCEL BLUNT TIP 100MML (ENDOMECHANICALS) ×3 IMPLANT
TROCAR XCEL NON-BLD 5MMX100MML (ENDOMECHANICALS) ×3 IMPLANT
TUBING INSUFFLATION (TUBING) ×3 IMPLANT

## 2015-05-14 NOTE — Transfer of Care (Signed)
Immediate Anesthesia Transfer of Care Note  Patient: Shirley Leblanc  Procedure(s) Performed: Procedure(s): LAPAROSCOPIC CHOLECYSTECTOMY (N/A)  Patient Location: PACU  Anesthesia Type:General  Level of Consciousness: awake, alert  and oriented  Airway & Oxygen Therapy: Patient Spontanous Breathing and Patient connected to face mask oxygen  Post-op Assessment: Report given to RN, Post -op Vital signs reviewed and stable and Patient moving all extremities X 4  Post vital signs: Reviewed and stable  Last Vitals:  Filed Vitals:   05/14/15 1233  BP: 121/51  Pulse: 62  Temp:   Resp:   HR 99, RR 13, 155/99, Sats 68% on FM  Complications: No apparent anesthesia complications

## 2015-05-14 NOTE — Progress Notes (Signed)
  Patient seen by Dr. Curt Bears 05/12/15 and cleared for surgery (lap chole), which is scheduled for today. We will revisit tomorrow, post-op.   SIMMONS, Lina Sayre, Wonda Cheng, MD  05/14/2015 11:34 AM    [place],  [place], [place]  [address] Pager [phone] Phone: [phone]; Fax: [phone]   [facility]  [address] [place], [place]  [address] [phone]   Fax [phone]

## 2015-05-14 NOTE — Op Note (Signed)

## 2015-05-14 NOTE — Progress Notes (Signed)
Patient ID: Shirley Leblanc, female   DOB: 1945-12-12, 69 y.o.   MRN: [id number]  For lap chole today for sympt cholelithiasis  I discussed the procedure in detail.  We discussed the risks and benefits of a laparoscopic cholecystectomy and possible cholangiogram including, but not limited to bleeding, infection, injury to surrounding structures such as the intestine or liver, bile leak, retained gallstones, need to convert to an open procedure, prolonged diarrhea, blood clots such as  DVT, common bile duct injury, anesthesia risks, and possible need for additional procedures.  The likelihood of improvement in symptoms and return to the patient's normal status is good. We discussed the typical post-operative recovery course.

## 2015-05-14 NOTE — Progress Notes (Signed)
Patient Demographics  Shirley Leblanc, is a 69 y.o. female, DOB - 1945-09-09, RXV:[id number]  Admit date - 05/09/2015   Admitting Physician Charlynne Cousins, MD  Outpatient Primary MD for the patient is [name], MD  LOS - 5   Chief Complaint  Patient presents with  . Cholelithiasis  . Nausea  . Emesis       Admission HPI/Brief narrative: 69 y/o female presents with  2 week hx of RUQ pain, CT abd /pelvis significant for gallstone, and by surgery, pain consistent with pericolic, cardiology following patient for preoperative clearance, patient transferred from [facility] to [facility] 10/13 for planned 2 days stress test prior to surgery, the stress test with no evidence of ischemia, patient is intermediate risk for an intermediate risk procedure, the plan is for laparoscopic cholecystectomy this afternoon  Subjective:   Shirley Leblanc today has, No headache, No chest pain, No Nausea, No new weakness tingling or numbness, No Cough - SOB., Complaining of abdominal pain.  Assessment & Plan    Active Problems:   RUQ pain   Essential hypertension   Epigastric abdominal pain   Precordial chest pain  Right upper quadrant pain secondary to be antecolic - Workup Significant for cholelithiasis - Cardiology consult appreciated, had nuclear stress test for preop evaluation, stress test shows no evidence of ischemia with an EF of 67% and no wall motion abnormalities, patient is intermediate risk for an intermediate risk procedures, plan is for laparoscopic cholecystectomy by surgery this afternoon. - Continue with when necessary pain and nausea medication.  Hypertension - Acceptable, continue lisinopril, stopped hydrochlorothiazide giving sodium was trending down gradually.  Hyperlipidemia - Continue with statin  Hypothyroidism - Continue with Synthroid  Cough with productive sputum  - Secondary to bibasilar  atelectasis as evident on chest x-ray , continue with Mucinex, and incentive spirometry, and ambulate.  Hyponatremia - Mild, will discontinue hydrochlorothiazide, will discontinue D5 half-normal saline,  - Resolved  Hypokalemia - Repleted, recheck in a.m.  Code Status: Full  Family Communication: Granddaughter at bedside  Disposition Plan: Pending further workup   Procedures  Nuclear stress test   Consults   Gen. Surgery Cardiology   Medications  Scheduled Meds: . antiseptic oral rinse  7 mL Mouth Rinse BID  . aspirin EC  81 mg Oral Daily  . atorvastatin  10 mg Oral Daily  . cholecalciferol  2,000 Units Oral Daily  . guaiFENesin  1,200 mg Oral BID  . heparin  5,000 Units Subcutaneous 3 times per day  . levothyroxine  50 mcg Oral QAC breakfast  . lisinopril  40 mg Oral Daily  . pantoprazole (PROTONIX) IV  40 mg Intravenous Q12H  . potassium chloride  20 mEq Oral Once  . sertraline  200 mg Oral Daily   Continuous Infusions:   PRN Meds:.clonazePAM, HYDROcodone-acetaminophen, HYDROmorphone (DILAUDID) injection, Influenza vac split quadrivalent PF, ondansetron **OR** ondansetron (ZOFRAN) IV, polyethylene glycol  DVT Prophylaxis  heparin  Lab Results  Component Value Date   PLT 177 05/14/2015    Antibiotics    Anti-infectives    Start     Dose/Rate Route Frequency Ordered Stop   05/13/15 0900  metroNIDAZOLE (FLAGYL) IVPB 500 mg     500 mg 100 mL/hr over  60 Minutes Intravenous On call to O.R. 05/13/15 0845 05/14/15 0559   05/13/15 0900  ciprofloxacin (CIPRO) IVPB 400 mg     400 mg 200 mL/hr over 60 Minutes Intravenous On call to O.R. 05/13/15 0845 05/14/15 0559          Objective:   Filed Vitals:   05/13/15 1109 05/13/15 1509 05/13/15 2035 05/14/15 0500  BP: 131/74 117/80 128/84 152/87  Pulse:  68 80 81  Temp:  98.6 F (37 C) 98 F (36.7 C) 97.3 F (36.3 C)  TempSrc:  Oral Oral Oral  Resp:  17 17 16   Height:      Weight:      SpO2:  96% 94%  95%    Wt Readings from Last 3 Encounters:  05/10/15 123.832 kg (273 lb)  03/27/11 125.646 kg (277 lb)  03/14/11 125.193 kg (276 lb)     Intake/Output Summary (Last 24 hours) at 05/14/15 1035 Last data filed at 05/14/15 0845  Gross per 24 hour  Intake   1440 ml  Output      0 ml  Net   1440 ml     Physical Exam  Awake Alert, Oriented X 3, [place].AT,PERRAL Supple Neck,No JVD,  Symmetrical Chest wall movement, Good air movement bilaterally,  RRR,No Gallops,Rubs or new Murmurs, No Parasternal Heave +ve B.Sounds, Abd Soft, mild right upper quadrant tenderness,No rebound - guarding or rigidity. No Cyanosis, Clubbing or edema, No new Rash or bruise     Data Review   Micro Results No results found for this or any previous visit (from the past 240 hour(s)).  Radiology Reports Ct Abdomen Pelvis Wo Contrast  05/09/2015  CLINICAL DATA:  Right side abdominal pain, nausea, vomiting for 2 weeks. Gallstones. EXAM: CT ABDOMEN AND PELVIS WITHOUT CONTRAST TECHNIQUE: Multidetector CT imaging of the abdomen and pelvis was performed following the standard protocol without IV contrast. COMPARISON:  Ultrasound 05/09/2015 FINDINGS: Lung bases are clear.  No effusions.  Heart is normal size. Mild elevation of the right hemidiaphragm. Mild fatty infiltration of the liver without focal abnormality. Small gallstone noted layering within the gallbladder. Spleen, pancreas, adrenals and kidneys have an unremarkable unenhanced appearance. No renal or ureteral stones. No hydronephrosis. 14 mm calcified splenic artery aneurysm in the left upper quadrant. Small duodenal diverticulum off the second portion of the duodenum. Stomach and small bowel are decompressed. Appendix not visualized. Large bowel unremarkable. No free fluid, free air or adenopathy. Prior hysterectomy. No adnexal masses. Urinary bladder is decompressed, grossly unremarkable. Aortic and iliac calcifications without aneurysm. No acute bony  abnormality or focal bone lesion. Degenerative disc and facet disease throughout the lumbar spine. Mild levoscoliosis. IMPRESSION: No evidence of renal or ureteral stones.  No hydronephrosis. No acute findings in the abdomen or pelvis. Fatty liver. For 2 mm calcified splenic artery aneurysm. Electronically Signed   By: Rolm Baptise M.D.   On: 05/09/2015 12:29   Dg Chest 2 View  05/09/2015  CLINICAL DATA:  Shortness of breath and chest pain for 3 month EXAM: CHEST  2 VIEW COMPARISON:  Chest radiograph April 25, 2015; chest CT May 02, 2015 FINDINGS: There is mild scarring in the left lower lobe. Lungs elsewhere clear. Heart size and pulmonary vascularity are normal. No adenopathy. No pneumothorax. There is a thoracic stimulator with the tip posteriorly at the mid thoracic level. There is postoperative change in the lower cervical spine. There is degenerative change in the thoracic spine. IMPRESSION: No edema or consolidation.  Slight  scarring left lower lobe. Electronically Signed   By: Lowella Grip III M.D.   On: 05/09/2015 15:53   Dg Chest 2 View  04/25/2015  CLINICAL DATA:  ongoing intermittent chest pain for 6 months EXAM: CHEST  2 VIEW COMPARISON:  Radiograph 08/16/2014 FINDINGS: Anterior cervical fixation noted. Stimulation electrodes in the lower thoracic spine central canal. Normal cardiac silhouette with ectatic aorta. Central venous pulmonary pulmonary congestion is present. No pulmonary edema. Small RIGHT effusion. IMPRESSION: Central venous congestion small RIGHT effusion. Electronically Signed   By: Suzy Bouchard M.D.   On: 04/25/2015 13:00   Ct Chest Wo Contrast  05/02/2015  CLINICAL DATA:  Shortness of breath, mid chest pain intermittently, productive cough for 2 weeks, small RIGHT pleural effusion on recent chest radiograph EXAM: CT CHEST WITHOUT CONTRAST TECHNIQUE: Multidetector CT imaging of the chest was performed following the standard protocol without IV contrast.  COMPARISON:  01/18/2014 CT chest, chest radiograph 04/25/2015 FINDINGS: Scattered atherosclerotic calcifications aorta, coronary arteries and minimally proximal great vessels. No thoracic adenopathy. Minimal dependent density within gallbladder question tiny gallstones. Small hiatal hernia questioned. Calcified splenic artery aneurysm 15 x 12 mm unchanged. Intraspinal stimulator. Lungs clear. No pulmonary infiltrate, pleural effusion or pneumothorax. Mild elevation of RIGHT diaphragm and prominent epicardial fat pads noted. Mild degenerative disc disease changes thoracic spine with evidence of prior cervical spine fusion. IMPRESSION: Small hiatal hernia. Scattered calcified atherosclerotic plaque including a small calcified splenic artery aneurysm. No acute intra thoracic abnormalities. Suspect minimal cholelithiasis. Electronically Signed   By: Lavonia Dana M.D.   On: 05/02/2015 09:17   Nm Myocar Multi W/spect W/wall Motion / Ef  05/12/2015  CLINICAL DATA:  Precordial chest pain EXAM: MYOCARDIAL IMAGING WITH SPECT (REST AND PHARMACOLOGIC-STRESS) GATED LEFT VENTRICULAR WALL MOTION STUDY LEFT VENTRICULAR EJECTION FRACTION TECHNIQUE: Standard myocardial SPECT imaging was performed after resting intravenous injection of 10 mCi Tc-23m sestamibi. Subsequently, intravenous infusion of Lexiscan was performed under the supervision of the Cardiology staff. At peak effect of the drug, 30 mCi Tc-38m sestamibi was injected intravenously and standard myocardial SPECT imaging was performed. Quantitative gated imaging was also performed to evaluate left ventricular wall motion, and estimate left ventricular ejection fraction. COMPARISON:  None. FINDINGS: Perfusion: No decreased activity in the left ventricle on stress imaging to suggest reversible ischemia or infarction. Wall Motion: Normal left ventricular wall motion. No left ventricular dilation. Left Ventricular Ejection Fraction: 67 % End diastolic volume 97 ml End  systolic volume 32 ml IMPRESSION: 1. No reversible ischemia or infarction. 2. Normal left ventricular wall motion. 3. Left ventricular ejection fraction 67% 4. Low-risk stress test findings*. *2012 Appropriate Use Criteria for Coronary Revascularization Focused Update: J Am Coll Cardiol. 2706;23(7):628-315. http://content.[email or web address].aspx?articleid=[email or web address] Electronically Signed   By: Julian Hy M.D.   On: 05/12/2015 13:06   Dg Chest Port 1 View  05/11/2015  CLINICAL DATA:  69 year old female with acute cough today. EXAM: PORTABLE CHEST 1 VIEW COMPARISON:  05/09/2015 and prior radiographs FINDINGS: Upper limits normal heart size again identified. Minimal subsegmental bibasilar atelectasis noted. There is no evidence of focal airspace disease, pulmonary edema, suspicious pulmonary nodule/mass, pleural effusion, or pneumothorax. No acute bony abnormalities are identified. A remote proximal left humeral fracture is again noted. Lower cervical spine surgical hardware and thoracic neurostimulator again identified IMPRESSION: Minimal subsegmental bibasilar atelectasis. Electronically Signed   By: Margarette Canada M.D.   On: 05/11/2015 12:59   US Abdomen Limited Ruq  05/09/2015  CLINICAL DATA:  Vomiting for 1 month.  Hypertension. EXAM: US ABDOMEN LIMITED - RIGHT UPPER QUADRANT COMPARISON:  11/15/2010 FINDINGS: Gallbladder: Echogenic mobile 1 cm gallstone in the gallbladder. No gallbladder wall thickening or sonographic Murphy sign. No pericholecystic fluid. Common bile duct: Diameter: 4 mm, within normal limits. Liver: Coarse echogenic liver with poor sonic penetration compatible with diffuse hepatic steatosis. IMPRESSION: 1. Cholelithiasis. Sonographic Murphy's sign absent; no gallbladder wall thickening. 2. Coarse echogenic liver with poor sonic penetration compatible with diffuse hepatic steatosis. Electronically Signed   By: Van Clines M.D.   On: 05/09/2015 10:44     CBC  Recent  Labs Lab 05/09/15 1047 05/10/15 0515 05/12/15 0400 05/14/15 0253  WBC 9.7 7.6 10.3 7.4  HGB 13.9 12.9 12.7 12.0  HCT 41.6 40.0 39.2 36.7  PLT 212 212 220 177  MCV 90.6 92.2 91.8 91.3  MCH 30.3 29.7 29.7 29.9  MCHC 33.4 32.3 32.4 32.7  RDW 12.9 13.1 13.4 12.9    Chemistries   Recent Labs Lab 05/09/15 1047 05/10/15 0515 05/12/15 0400 05/13/15 0419 05/14/15 0253  NA 138 135 133* 140 140  K 3.8 4.0 4.0 3.3* 3.6  CL 104 102 96* 99* 99*  CO2 26 27 28  32 33*  GLUCOSE 131* 118* 114* 124* 103*  BUN 14 13 9  <5* 5*  CREATININE 0.55 0.69 0.80 0.66 0.63  CALCIUM 9.4 9.0 9.4 9.5 9.4  AST 20 19  --   --   --   ALT 17 17  --   --   --   ALKPHOS 85 75  --   --   --   BILITOT 0.7 0.5  --   --   --    ------------------------------------------------------------------------------------------------------------------ estimated creatinine clearance is 89.2 mL/min (by C-G formula based on Cr of 0.63). ------------------------------------------------------------------------------------------------------------------ No results for input(s): HGBA1C in the last 72 hours. ------------------------------------------------------------------------------------------------------------------ No results for input(s): CHOL, HDL, LDLCALC, TRIG, CHOLHDL, LDLDIRECT in the last 72 hours. ------------------------------------------------------------------------------------------------------------------ No results for input(s): TSH, T4TOTAL, T3FREE, THYROIDAB in the last 72 hours.  Invalid input(s): FREET3 ------------------------------------------------------------------------------------------------------------------ No results for input(s): VITAMINB12, FOLATE, FERRITIN, TIBC, IRON, RETICCTPCT in the last 72 hours.  Coagulation profile No results for input(s): INR, PROTIME in the last 168 hours.  No results for input(s): DDIMER in the last 72 hours.  Cardiac Enzymes No results for input(s): CKMB,  TROPONINI, MYOGLOBIN in the last 168 hours.  Invalid input(s): CK ------------------------------------------------------------------------------------------------------------------ Invalid input(s): POCBNP     Time Spent in minutes   25 minutes   Rudell Ortman M.D on 05/14/2015 at 10:35 AM  Between 7am to 7pm - Pager - [phone]  After 7pm go to [email or web address] - password [facility]  [facility]   Office  [phone]

## 2015-05-14 NOTE — Anesthesia Procedure Notes (Addendum)
Procedure Name: Intubation Date/Time: 05/14/2015 1:48 PM Performed by: Merrilyn Puma B Pre-anesthesia Checklist: Patient identified, Timeout performed, Emergency Drugs available, Patient being monitored and Suction available Patient Re-evaluated:Patient Re-evaluated prior to inductionOxygen Delivery Method: Circle system utilized Preoxygenation: Pre-oxygenation with 100% oxygen Intubation Type: IV induction Ventilation: Mask ventilation without difficulty Laryngoscope Size: Mac and 4 Grade View: Grade I Tube type: Oral Tube size: 7.0 mm Number of attempts: 1 Airway Equipment and Method: Stylet Placement Confirmation: CO2 detector,  positive ETCO2,  ETT inserted through vocal cords under direct vision and breath sounds checked- equal and bilateral Secured at: 21 cm Tube secured with: Tape Dental Injury: Teeth and Oropharynx as per pre-operative assessment

## 2015-05-14 NOTE — Progress Notes (Signed)
PT Cancellation Note  Patient Details Name: Shirley Leblanc MRN: [id number] DOB: 06/06/1946   Cancelled Treatment:    Reason Eval/Treat Not Completed: Other (comment) Pt to OR today for Lap choley. Will follow up next available time to perform PT evaluation post surgery.  [place] 05/14/2015, 9:00 AM Wray Kearns, [place], DPT [phone]

## 2015-05-14 NOTE — Anesthesia Preprocedure Evaluation (Addendum)
Anesthesia Evaluation  Patient identified by MRN, date of birth, ID band Patient awake    Reviewed: Allergy & Precautions, NPO status , Patient's Chart, lab work & pertinent test results  History of Anesthesia Complications Negative for: history of anesthetic complications  Airway Mallampati: III  TM Distance: >3 FB Neck ROM: Full    Dental  (+) Edentulous Upper, Edentulous Lower   Pulmonary shortness of breath, neg sleep apnea, neg recent URI, neg PE   breath sounds clear to auscultation       Cardiovascular hypertension, Pt. on medications  Rhythm:Regular     Neuro/Psych PSYCHIATRIC DISORDERS Depression negative neurological ROS     GI/Hepatic Neg liver ROS, GERD  Medicated and Controlled,CHOLECYSTITIS   Endo/Other  Hypothyroidism Morbid obesity  Renal/GU negative Renal ROS     Musculoskeletal  (+) Arthritis ,   Abdominal   Peds  Hematology   Anesthesia Other Findings Left ventricle: The cavity size was normal. Wall thickness was increased in a pattern of mild LVH. Systolic function was normal. The estimated ejection fraction was in the range of 55% to 60%. Wall motion was normal; there were no regional wall motion abnormalities. Doppler parameters are consistent with abnormal left ventricular relaxation (grade 1 diastolic dysfunction).  Seen by cardiac for chest pain and found to be non-ischemic  Reproductive/Obstetrics                           Anesthesia Physical Anesthesia Plan  ASA: III  Anesthesia Plan: General   Post-op Pain Management:    Induction: Intravenous  Airway Management Planned: Oral ETT  Additional Equipment: None  Intra-op Plan:   Post-operative Plan: Extubation in OR  Informed Consent: I have reviewed the patients History and Physical, chart, labs and discussed the procedure including the risks, benefits and alternatives for the proposed  anesthesia with the patient or authorized representative who has indicated his/her understanding and acceptance.   Dental advisory given  Plan Discussed with: Surgeon and CRNA  Anesthesia Plan Comments:        Anesthesia Quick Evaluation

## 2015-05-15 ENCOUNTER — Observation Stay (HOSPITAL_COMMUNITY): Payer: Medicare Other

## 2015-05-15 ENCOUNTER — Encounter (HOSPITAL_COMMUNITY): Payer: Self-pay | Admitting: Surgery

## 2015-05-15 DIAGNOSIS — Z6841 Body Mass Index (BMI) 40.0 and over, adult: Secondary | ICD-10-CM | POA: Diagnosis not present

## 2015-05-15 DIAGNOSIS — K8000 Calculus of gallbladder with acute cholecystitis without obstruction: Secondary | ICD-10-CM | POA: Diagnosis not present

## 2015-05-15 DIAGNOSIS — E039 Hypothyroidism, unspecified: Secondary | ICD-10-CM | POA: Diagnosis not present

## 2015-05-15 DIAGNOSIS — I5032 Chronic diastolic (congestive) heart failure: Secondary | ICD-10-CM | POA: Diagnosis not present

## 2015-05-15 DIAGNOSIS — J9601 Acute respiratory failure with hypoxia: Secondary | ICD-10-CM | POA: Diagnosis not present

## 2015-05-15 DIAGNOSIS — R1013 Epigastric pain: Secondary | ICD-10-CM | POA: Diagnosis not present

## 2015-05-15 DIAGNOSIS — E871 Hypo-osmolality and hyponatremia: Secondary | ICD-10-CM | POA: Diagnosis not present

## 2015-05-15 DIAGNOSIS — R072 Precordial pain: Secondary | ICD-10-CM | POA: Diagnosis not present

## 2015-05-15 DIAGNOSIS — R0902 Hypoxemia: Secondary | ICD-10-CM | POA: Diagnosis not present

## 2015-05-15 DIAGNOSIS — R1011 Right upper quadrant pain: Secondary | ICD-10-CM

## 2015-05-15 DIAGNOSIS — I10 Essential (primary) hypertension: Secondary | ICD-10-CM | POA: Diagnosis not present

## 2015-05-15 DIAGNOSIS — I110 Hypertensive heart disease with heart failure: Secondary | ICD-10-CM | POA: Diagnosis not present

## 2015-05-15 LAB — CBC
HEMATOCRIT: 37.9 % (ref 36.0–46.0)
Hemoglobin: 12.2 g/dL (ref 12.0–15.0)
MCH: 29.2 pg (ref 26.0–34.0)
MCHC: 32.2 g/dL (ref 30.0–36.0)
MCV: 90.7 fL (ref 78.0–100.0)
PLATELETS: 198 10*3/uL (ref 150–400)
RBC: 4.18 MIL/uL (ref 3.87–5.11)
RDW: 13 % (ref 11.5–15.5)
WBC: 11.8 10*3/uL — AB (ref 4.0–10.5)

## 2015-05-15 LAB — BASIC METABOLIC PANEL
ANION GAP: 10 (ref 5–15)
BUN: <5 mg/dL — ABNORMAL LOW (ref 6–20)
CALCIUM: 9.7 mg/dL (ref 8.9–10.3)
CO2: 30 mmol/L (ref 22–32)
CREATININE: 0.73 mg/dL (ref 0.44–1.00)
Chloride: 99 mmol/L — ABNORMAL LOW (ref 101–111)
GLUCOSE: 122 mg/dL — AB (ref 65–99)
Potassium: 3.7 mmol/L (ref 3.5–5.1)
Sodium: 139 mmol/L (ref 135–145)

## 2015-05-15 MED ORDER — FUROSEMIDE 20 MG PO TABS
20.0000 mg | ORAL_TABLET | Freq: Every day | ORAL | Status: DC
  Administered 2015-05-16 – 2015-05-17 (×2): 20 mg via ORAL
  Filled 2015-05-15 (×2): qty 1

## 2015-05-15 MED ORDER — FUROSEMIDE 10 MG/ML IJ SOLN
40.0000 mg | Freq: Once | INTRAMUSCULAR | Status: AC
  Administered 2015-05-15: 40 mg via INTRAVENOUS
  Filled 2015-05-15: qty 4

## 2015-05-15 NOTE — Evaluation (Signed)
Occupational Therapy Evaluation Patient Details Name: Shirley Leblanc MRN: [id number] DOB: 10-17-1945 Today's Date: 05/15/2015    History of Present Illness 69 y.o. female with significant history of 2 weeks N & V from gallstone and cardiology is going to clear for surgery if able.  Also noted bibasilar atelectasis and thoracic neurostim device. S/p Lap chole 10/17.   Clinical Impression   Pt s/p above. Pt independent with ADLs, PTA. Plan is for pt to d/c home today, so recommending [place] and deferring all further OT needs to next venue of care.    Follow Up Recommendations  Home health OT    Equipment Recommendations  None recommended by OT    Recommendations for Other Services       Precautions / Restrictions Precautions Precautions: Fall Restrictions Weight Bearing Restrictions: No      Mobility Bed Mobility Overal bed mobility: Needs Assistance Bed Mobility: Supine to Sit;Sit to Supine     Supine to sit: Modified independent (Device/Increase time) Sit to supine: (OT adjusted bed so pt could get closer to [facility], no other assist needed otherwise)      Transfers Overall transfer level: Needs assistance   Transfers: Sit to/from Stand Sit to Stand: Supervision         General transfer comment: RW in front for support    Balance  No LOB in session. Balance not formally assessed.                                           ADL Overall ADL's : Needs assistance/impaired     Grooming: Wash/dry face;Wash/dry hands;Set up;Supervision/safety;Standing               Lower Body Dressing: Sit to/from stand;Minimal assistance   Toilet Transfer: Min guard;Ambulation;RW (sit to stand from bed-supervision)           Functional mobility during ADLs: Min guard;Rolling walker General ADL Comments: Educated on energy conservation techniques. Educated on safety such as safe footwear, use of bag on walker, and sitting for LB bathing. Recommended  someone be with her for tub transfer.     Vision     Perception     Praxis      Pertinent Vitals/Pain Pain Assessment: 0-10 Pain Score: 5  Pain Location: abdomen Pain Descriptors / Indicators: Sore;Throbbing Pain Intervention(s): Monitored during session   Pt on RA when OT arrived and O2 in 80s but trended up to 90s. Placed pt on 1L of O2 before ambulating. O2 in 90s after ambulating to sink and in 90s at end of session.     Hand Dominance     Extremity/Trunk Assessment Upper Extremity Assessment Upper Extremity Assessment: RUE deficits/detail;LUE deficits/detail RUE Deficits / Details: generalized weakness LUE Deficits / Details: reports injury in LUE-limited AROM shoulder flexion; weakness   Lower Extremity Assessment Lower Extremity Assessment: Defer to PT evaluation       Communication Communication Communication: No difficulties   Cognition Arousal/Alertness: Awake/alert Behavior During Therapy: WFL for tasks assessed/performed Overall Cognitive Status: Within Functional Limits for tasks assessed                     General Comments       Exercises       Shoulder Instructions      Home Living Family/patient expects to be discharged to:: Private residence Living Arrangements: Alone Available Help  at Discharge: Family;Available PRN/intermittently Type of Home: House       Home Layout: One level     Bathroom Shower/Tub: Walk-in shower;Tub/shower unit   Bathroom Toilet: Standard Bathroom Accessibility: Yes   Home Equipment: Bedside commode;Walker - 2 wheels;Shower seat - built in          Prior Functioning/Environment Level of Independence: Independent with assistive device(s)             OT Diagnosis: Generalized weakness;Acute pain   OT Problem List: Decreased strength;Obesity;Pain;Decreased knowledge of precautions;Decreased knowledge of use of DME or AE;Decreased activity tolerance   OT Treatment/Interventions:  Self-care/ADL training;DME and/or AE instruction;Therapeutic activities;Patient/family education;Balance training;Therapeutic exercise;Energy conservation    OT Goals(Current goals can be found in the care plan section)    OT Frequency:     Barriers to D/C:            Co-evaluation              End of Session Equipment Utilized During Treatment: Gait belt;Rolling walker;Oxygen  Activity Tolerance: Patient tolerated treatment well Patient left: in bed;with call bell/phone within reach   Time: 1250-1312 OT Time Calculation (min): 22 min Charges:  OT General Charges $OT Visit: 1 Procedure OT Evaluation $Initial OT Evaluation Tier I: 1 Procedure G-Codes: OT G-codes **NOT FOR INPATIENT CLASS** Functional Assessment Tool Used: clinical judgment Functional Limitation: Self care Self Care Current Status (T0626): At least 1 percent but less than 20 percent impaired, limited or restricted Self Care Goal Status (R4854): At least 1 percent but less than 20 percent impaired, limited or restricted Self Care Discharge Status [phone]): At least 1 percent but less than 20 percent impaired, limited or restricted  Benito Mccreedy OTR/L [phone] 05/15/2015, 2:04 PM

## 2015-05-15 NOTE — Progress Notes (Signed)
Patient Demographics  Shirley Leblanc, is a 69 y.o. female, DOB - 10/10/1945, KVQ:[id number]  Admit date - 05/09/2015   Admitting Physician Charlynne Cousins, MD  Outpatient Primary MD for the patient is [name], MD  LOS - 6   Chief Complaint  Patient presents with  . Cholelithiasis  . Nausea  . Emesis       Admission HPI/Brief narrative: 69 y/o female presents with  2 week hx of RUQ pain, CT abd /pelvis significant for gallstone, and by surgery, pain consistent with pericolic, cardiology following patient for preoperative clearance, patient transferred from [facility] to [facility] 10/13 for planned 2 days stress test prior to surgery, the stress test with no evidence of ischemia, patient is intermediate risk for an intermediate risk procedure, patient had laparoscopic cholecystectomy on 05/14/2015.  Subjective:   [name] today has, No headache, No chest pain, No Nausea, No new weakness tingling or numbness,complaining of mild abdominal pain , had vomiting 2 , not eat much and breakfast or lunch .  Assessment & Plan    Active Problems:   RUQ pain   Essential hypertension   Epigastric abdominal pain   Precordial chest pain  Right upper quadrant pain secondary to be antecolic - Workup Significant for cholelithiasis - Cardiology consult appreciated, had nuclear stress test for preop evaluation, stress test shows no evidence of ischemia with an EF of 67% and no wall motion abnormalities, patient is intermediate risk for an intermediate risk procedure -  laparoscopic cholecystectomy by Dr. Ninfa Linden on 05/14/2015  - Continue with when necessary pain and nausea medication. - patient  reports she is passing gas, no bowel movement yet, has poor oral intake in breakfast and lunch, had vomiting of small amount x2,  will hold on to discharge for today , patient was encouraged to ambulate.  Hypoxia - In  the setting of chronic diastolic CHF, patient 2-D echo showing grade 1 diastolic CHF, as well chest x-ray showing evidence of bibasilar atelectasis,  patient was encouraged to ambulate, continue incentive spirometry, very likely will need oxygen on discharge . - Given 40 mg of IV Lasix today, will start on low dose Lasix 20 mg oral daily   Hypertension - Acceptable, continue lisinopril, stopped hydrochlorothiazide giving sodium was trending down gradually.  Hyperlipidemia - Continue with statin  Hypothyroidism - Continue with Synthroid  Cough with productive sputum  - Secondary to bibasilar atelectasis as evident on chest x-ray , continue with Mucinex, and incentive spirometry, and ambulate.  Hyponatremia - Mild, will discontinue hydrochlorothiazide, will discontinue D5 half-normal saline,  - Resolved  Hypokalemia - Repleted, recheck in a.m.  Code Status: Full  Family Communication: Granddaughter at bedside  Disposition Plan:  Home in 24 hours  Procedures  Nuclear stress test laparoscopic cholecystectomy 10/17 by Dr. Ninfa Linden  Consults   Gen. Surgery Cardiology   Medications  Scheduled Meds: . antiseptic oral rinse  7 mL Mouth Rinse BID  . aspirin EC  81 mg Oral Daily  . atorvastatin  10 mg Oral Daily  . cholecalciferol  2,000 Units Oral Daily  . guaiFENesin  1,200 mg Oral BID  . heparin  5,000 Units Subcutaneous 3 times per day  . levothyroxine  50 mcg Oral QAC  breakfast  . lisinopril  40 mg Oral Daily  . pantoprazole (PROTONIX) IV  40 mg Intravenous Q12H  . potassium chloride  20 mEq Oral Once  . sertraline  200 mg Oral Daily   Continuous Infusions: . lactated ringers 10 mL/hr at 05/14/15 1326   PRN Meds:.clonazePAM, HYDROcodone-acetaminophen, Influenza vac split quadrivalent PF, ondansetron **OR** ondansetron (ZOFRAN) IV, polyethylene glycol  DVT Prophylaxis  heparin  Lab Results  Component Value Date   PLT 198 05/15/2015    Antibiotics     Anti-infectives    Start     Dose/Rate Route Frequency Ordered Stop   05/14/15 1345  metroNIDAZOLE (FLAGYL) IVPB 500 mg     500 mg 100 mL/hr over 60 Minutes Intravenous To Surgery 05/14/15 1330 05/14/15 1345   05/14/15 1324  ciprofloxacin (CIPRO) 400 MG/200ML IVPB    Comments:  Merrilyn Puma   : cabinet override      05/14/15 1324 05/14/15 1346   05/13/15 0900  metroNIDAZOLE (FLAGYL) IVPB 500 mg  Status:  Discontinued     500 mg 100 mL/hr over 60 Minutes Intravenous On call to O.R. 05/13/15 0845 05/14/15 1611   05/13/15 0900  ciprofloxacin (CIPRO) IVPB 400 mg  Status:  Discontinued     400 mg 200 mL/hr over 60 Minutes Intravenous On call to O.R. 05/13/15 0845 05/14/15 1611          Objective:   Filed Vitals:   05/15/15 0600 05/15/15 0605 05/15/15 1023 05/15/15 1404  BP: 119/67  100/58 105/51  Pulse: 96  84 80  Temp: 97.9 F (36.6 C)   98.2 F (36.8 C)  TempSrc: Oral   Oral  Resp: 18 18  19   Height:      Weight:      SpO2: 87% 94%  94%    Wt Readings from Last 3 Encounters:  05/10/15 123.832 kg (273 lb)  03/27/11 125.646 kg (277 lb)  03/14/11 125.193 kg (276 lb)     Intake/Output Summary (Last 24 hours) at 05/15/15 1556 Last data filed at 05/15/15 1404  Gross per 24 hour  Intake    600 ml  Output      0 ml  Net    600 ml     Physical Exam  Awake Alert, Oriented X 3, [place].AT,PERRAL Supple Neck,No JVD,  Symmetrical Chest wall movement, Good air movement bilaterally,  RRR,No Gallops,Rubs or new Murmurs, No Parasternal Heave +ve B.Sounds, Abd Soft, appropriately tender ,incisions c/d/i, No rebound - guarding or rigidity. No Cyanosis, Clubbing or edema, No new Rash or bruise     Data Review   Micro Results No results found for this or any previous visit (from the past 240 hour(s)).  Radiology Reports Ct Abdomen Pelvis Wo Contrast  05/09/2015  CLINICAL DATA:  Right side abdominal pain, nausea, vomiting for 2 weeks. Gallstones. EXAM: CT ABDOMEN AND  PELVIS WITHOUT CONTRAST TECHNIQUE: Multidetector CT imaging of the abdomen and pelvis was performed following the standard protocol without IV contrast. COMPARISON:  Ultrasound 05/09/2015 FINDINGS: Lung bases are clear.  No effusions.  Heart is normal size. Mild elevation of the right hemidiaphragm. Mild fatty infiltration of the liver without focal abnormality. Small gallstone noted layering within the gallbladder. Spleen, pancreas, adrenals and kidneys have an unremarkable unenhanced appearance. No renal or ureteral stones. No hydronephrosis. 14 mm calcified splenic artery aneurysm in the left upper quadrant. Small duodenal diverticulum off the second portion of the duodenum. Stomach and small bowel are decompressed. Appendix not visualized.  Large bowel unremarkable. No free fluid, free air or adenopathy. Prior hysterectomy. No adnexal masses. Urinary bladder is decompressed, grossly unremarkable. Aortic and iliac calcifications without aneurysm. No acute bony abnormality or focal bone lesion. Degenerative disc and facet disease throughout the lumbar spine. Mild levoscoliosis. IMPRESSION: No evidence of renal or ureteral stones.  No hydronephrosis. No acute findings in the abdomen or pelvis. Fatty liver. For 2 mm calcified splenic artery aneurysm. Electronically Signed   By: Rolm Baptise M.D.   On: 05/09/2015 12:29   Dg Chest 2 View  05/09/2015  CLINICAL DATA:  Shortness of breath and chest pain for 3 month EXAM: CHEST  2 VIEW COMPARISON:  Chest radiograph April 25, 2015; chest CT May 02, 2015 FINDINGS: There is mild scarring in the left lower lobe. Lungs elsewhere clear. Heart size and pulmonary vascularity are normal. No adenopathy. No pneumothorax. There is a thoracic stimulator with the tip posteriorly at the mid thoracic level. There is postoperative change in the lower cervical spine. There is degenerative change in the thoracic spine. IMPRESSION: No edema or consolidation.  Slight scarring left  lower lobe. Electronically Signed   By: Lowella Grip III M.D.   On: 05/09/2015 15:53   Dg Chest 2 View  04/25/2015  CLINICAL DATA:  ongoing intermittent chest pain for 6 months EXAM: CHEST  2 VIEW COMPARISON:  Radiograph 08/16/2014 FINDINGS: Anterior cervical fixation noted. Stimulation electrodes in the lower thoracic spine central canal. Normal cardiac silhouette with ectatic aorta. Central venous pulmonary pulmonary congestion is present. No pulmonary edema. Small RIGHT effusion. IMPRESSION: Central venous congestion small RIGHT effusion. Electronically Signed   By: Suzy Bouchard M.D.   On: 04/25/2015 13:00   Ct Chest Wo Contrast  05/02/2015  CLINICAL DATA:  Shortness of breath, mid chest pain intermittently, productive cough for 2 weeks, small RIGHT pleural effusion on recent chest radiograph EXAM: CT CHEST WITHOUT CONTRAST TECHNIQUE: Multidetector CT imaging of the chest was performed following the standard protocol without IV contrast. COMPARISON:  01/18/2014 CT chest, chest radiograph 04/25/2015 FINDINGS: Scattered atherosclerotic calcifications aorta, coronary arteries and minimally proximal great vessels. No thoracic adenopathy. Minimal dependent density within gallbladder question tiny gallstones. Small hiatal hernia questioned. Calcified splenic artery aneurysm 15 x 12 mm unchanged. Intraspinal stimulator. Lungs clear. No pulmonary infiltrate, pleural effusion or pneumothorax. Mild elevation of RIGHT diaphragm and prominent epicardial fat pads noted. Mild degenerative disc disease changes thoracic spine with evidence of prior cervical spine fusion. IMPRESSION: Small hiatal hernia. Scattered calcified atherosclerotic plaque including a small calcified splenic artery aneurysm. No acute intra thoracic abnormalities. Suspect minimal cholelithiasis. Electronically Signed   By: Lavonia Dana M.D.   On: 05/02/2015 09:17   Nm Myocar Multi W/spect W/wall Motion / Ef  05/12/2015  CLINICAL DATA:   Precordial chest pain EXAM: MYOCARDIAL IMAGING WITH SPECT (REST AND PHARMACOLOGIC-STRESS) GATED LEFT VENTRICULAR WALL MOTION STUDY LEFT VENTRICULAR EJECTION FRACTION TECHNIQUE: Standard myocardial SPECT imaging was performed after resting intravenous injection of 10 mCi Tc-27m sestamibi. Subsequently, intravenous infusion of Lexiscan was performed under the supervision of the Cardiology staff. At peak effect of the drug, 30 mCi Tc-55m sestamibi was injected intravenously and standard myocardial SPECT imaging was performed. Quantitative gated imaging was also performed to evaluate left ventricular wall motion, and estimate left ventricular ejection fraction. COMPARISON:  None. FINDINGS: Perfusion: No decreased activity in the left ventricle on stress imaging to suggest reversible ischemia or infarction. Wall Motion: Normal left ventricular wall motion. No left ventricular dilation. Left Ventricular  Ejection Fraction: 67 % End diastolic volume 97 ml End systolic volume 32 ml IMPRESSION: 1. No reversible ischemia or infarction. 2. Normal left ventricular wall motion. 3. Left ventricular ejection fraction 67% 4. Low-risk stress test findings*. *2012 Appropriate Use Criteria for Coronary Revascularization Focused Update: J Am Coll Cardiol. 5974;16(3):845-364. http://content.airportbarriers.com.aspx?articleid=[email or web address] Electronically Signed   By: Julian Hy M.D.   On: 05/12/2015 13:06   Dg Chest Port 1 View  05/15/2015  CLINICAL DATA:  Hypoxia today. EXAM: PORTABLE CHEST 1 VIEW COMPARISON:  05/11/2015 FINDINGS: Heart size and pulmonary vascularity are normal. Small focal areas of atelectasis at both lung bases and in the medial aspect of the right upper lobe. No effusions. No lung consolidation. No acute osseous abnormality. Spinal cord stimulator in place in the mid thoracic spine. Chronic deformity of the proximal left humerus due to an old fracture. IMPRESSION: New slight bilateral atelectasis.  Electronically Signed   By: Lorriane Shire M.D.   On: 05/15/2015 10:32   Dg Chest Port 1 View  05/11/2015  CLINICAL DATA:  69 year old female with acute cough today. EXAM: PORTABLE CHEST 1 VIEW COMPARISON:  05/09/2015 and prior radiographs FINDINGS: Upper limits normal heart size again identified. Minimal subsegmental bibasilar atelectasis noted. There is no evidence of focal airspace disease, pulmonary edema, suspicious pulmonary nodule/mass, pleural effusion, or pneumothorax. No acute bony abnormalities are identified. A remote proximal left humeral fracture is again noted. Lower cervical spine surgical hardware and thoracic neurostimulator again identified IMPRESSION: Minimal subsegmental bibasilar atelectasis. Electronically Signed   By: Margarette Canada M.D.   On: 05/11/2015 12:59   US Abdomen Limited Ruq  05/09/2015  CLINICAL DATA:  Vomiting for 1 month.  Hypertension. EXAM: US ABDOMEN LIMITED - RIGHT UPPER QUADRANT COMPARISON:  11/15/2010 FINDINGS: Gallbladder: Echogenic mobile 1 cm gallstone in the gallbladder. No gallbladder wall thickening or sonographic Murphy sign. No pericholecystic fluid. Common bile duct: Diameter: 4 mm, within normal limits. Liver: Coarse echogenic liver with poor sonic penetration compatible with diffuse hepatic steatosis. IMPRESSION: 1. Cholelithiasis. Sonographic Murphy's sign absent; no gallbladder wall thickening. 2. Coarse echogenic liver with poor sonic penetration compatible with diffuse hepatic steatosis. Electronically Signed   By: Van Clines M.D.   On: 05/09/2015 10:44     CBC  Recent Labs Lab 05/09/15 1047 05/10/15 0515 05/12/15 0400 05/14/15 0253 05/15/15 0238  WBC 9.7 7.6 10.3 7.4 11.8*  HGB 13.9 12.9 12.7 12.0 12.2  HCT 41.6 40.0 39.2 36.7 37.9  PLT 212 212 220 177 198  MCV 90.6 92.2 91.8 91.3 90.7  MCH 30.3 29.7 29.7 29.9 29.2  MCHC 33.4 32.3 32.4 32.7 32.2  RDW 12.9 13.1 13.4 12.9 13.0    Chemistries   Recent Labs Lab  05/09/15 1047 05/10/15 0515 05/12/15 0400 05/13/15 0419 05/14/15 0253 05/15/15 0238  NA 138 135 133* 140 140 139  K 3.8 4.0 4.0 3.3* 3.6 3.7  CL 104 102 96* 99* 99* 99*  CO2 26 27 28  32 33* 30  GLUCOSE 131* 118* 114* 124* 103* 122*  BUN 14 13 9  <5* 5* <5*  CREATININE 0.55 0.69 0.80 0.66 0.63 0.73  CALCIUM 9.4 9.0 9.4 9.5 9.4 9.7  AST 20 19  --   --   --   --   ALT 17 17  --   --   --   --   ALKPHOS 85 75  --   --   --   --   BILITOT 0.7 0.5  --   --   --   --    ------------------------------------------------------------------------------------------------------------------  estimated creatinine clearance is 89.2 mL/min (by C-G formula based on Cr of 0.73). ------------------------------------------------------------------------------------------------------------------ No results for input(s): HGBA1C in the last 72 hours. ------------------------------------------------------------------------------------------------------------------ No results for input(s): CHOL, HDL, LDLCALC, TRIG, CHOLHDL, LDLDIRECT in the last 72 hours. ------------------------------------------------------------------------------------------------------------------ No results for input(s): TSH, T4TOTAL, T3FREE, THYROIDAB in the last 72 hours.  Invalid input(s): FREET3 ------------------------------------------------------------------------------------------------------------------ No results for input(s): VITAMINB12, FOLATE, FERRITIN, TIBC, IRON, RETICCTPCT in the last 72 hours.  Coagulation profile No results for input(s): INR, PROTIME in the last 168 hours.  No results for input(s): DDIMER in the last 72 hours.  Cardiac Enzymes No results for input(s): CKMB, TROPONINI, MYOGLOBIN in the last 168 hours.  Invalid input(s): CK ------------------------------------------------------------------------------------------------------------------ Invalid input(s): POCBNP     Time Spent in minutes   30   minutes   Keilah Lemire M.D on 05/15/2015 at 3:56 PM  Between 7am to 7pm - Pager - [phone]  After 7pm go to [email or web address] - password [facility]  [facility]   Office  [phone]

## 2015-05-15 NOTE — Progress Notes (Addendum)
Physical Therapy Treatment Patient Details Name: CLIFFORD COUDRIET MRN: [id number] DOB: Sep 13, 1945 Today's Date: 05/15/2015    History of Present Illness 69 yo female with significant history of 2 weeks N & V from gallstone and cardiology is going to clear for surgery if able.  Also noted bibasilar atelectasis and thoracic neurostim device. S/p Lap chole 10/17.    PT Comments    Patient presents with generalized weakness, deconditioning, impaired endurance and decrease in Sp02 at rest and worsened with mobility s/p above surgery impacting mobility. Sp02 dropped to 79% on RA. Sp02 ranged from 88-94% on 2L/min 02 during ambulation. Education re: pursed lip breathing and energy conservation techniques. Recommend using RW at all times at home. Pt adamant about returning home. Would benefit from [place] [facility] however refusing so will want to maximize [facility] services - HHPT and [place] aide. Pt's son lives close and grand daughter checks on her on weekends and a few times per week. Will most likely need supplemental 02. Will follow acutely to maximize independence and mobility prior to return home.   Follow Up Recommendations  Home health PT;Supervision for mobility/OOB (Recommend HH aide.)     Equipment Recommendations  Other (comment)    Recommendations for Other Services       Precautions / Restrictions Precautions Precautions: Fall Restrictions Weight Bearing Restrictions: No    Mobility  Bed Mobility Overal bed mobility: Needs Assistance Bed Mobility: Rolling;Sidelying to Sit Rolling: Supervision Sidelying to sit: Supervision       General bed mobility comments: HOB flat, no use of rails to simulate home.  Transfers Overall transfer level: Needs assistance Equipment used: Rolling walker (2 wheeled) Transfers: Sit to/from Stand Sit to Stand: Min guard         General transfer comment: Min guard for safety. Stood from [employer]. Increased effort. Multiple attempts to stand using momentum. Cues  for hand placement.   Ambulation/Gait Ambulation/Gait assistance: Min guard Ambulation Distance (Feet): 20 Feet (x2 bouts) Assistive device: Rolling walker (2 wheeled) Gait Pattern/deviations: Step-to pattern;Wide base of support;Shuffle;Decreased stride length Gait velocity: reduced   General Gait Details: Slow, mildly unsteady gait. Short standing rest breaks. Ambulated on RA - Sp02 decreased to 77% with dizziness. Ambulated on 2L/min 02 and SP02 ranged from 88-95%. No dizziness. Long seated rest break between bouts.   Stairs            Wheelchair Mobility    Modified Rankin (Stroke Patients Only)       Balance Overall balance assessment: Needs assistance Sitting-balance support: Feet supported;No upper extremity supported Sitting balance-Leahy Scale: Fair     Standing balance support: During functional activity Standing balance-Leahy Scale: Poor Standing balance comment: Relient on RW for support.                     Cognition Arousal/Alertness: Awake/alert Behavior During Therapy: WFL for tasks assessed/performed Overall Cognitive Status: Within Functional Limits for tasks assessed                      Exercises      General Comments General comments (skin integrity, edema, etc.): Sp02 on RA 83% at rest. Sp02 on 2L/min 02 90-95% at rest.  HR elevated to high 120s during gait.     Pertinent Vitals/Pain Pain Assessment: Faces Faces Pain Scale: Hurts little more Pain Location: abdomen Pain Descriptors / Indicators: Sore Pain Intervention(s): Repositioned;Monitored during session    Home Living  Prior Function            PT Goals (current goals can now be found in the care plan section) Progress towards PT goals: Progressing toward goals (slowly.)    Frequency  Min 3X/week    PT Plan Current plan remains appropriate    Co-evaluation             End of Session Equipment Utilized During  Treatment: Gait belt;Oxygen Activity Tolerance: Patient limited by fatigue Patient left: in bed;with call bell/phone within reach;with family/visitor present     Time: 0920-0951 PT Time Calculation (min) (ACUTE ONLY): 31 min  Charges:  $Gait Training: 8-22 mins $Therapeutic Exercise: 8-22 mins                    G Codes:  Functional Assessment Tool Used: Clinical judgment Functional Limitation: Mobility: Walking and moving around Mobility: Walking and Moving Around Current Status [phone]): At least 1 percent but less than 20 percent impaired, limited or restricted Mobility: Walking and Moving Around Goal Status [phone]): At least 1 percent but less than 20 percent impaired, limited or restricted   Craig 05/15/2015, 9:58 AM Wray Kearns, PT, DPT [phone]

## 2015-05-15 NOTE — Therapy (Signed)
SATURATION QUALIFICATIONS: (This note is used to comply with regulatory documentation for home oxygen)  Patient Saturations on Room Air at Rest = 83%  Patient Saturations on Room Air while Ambulating = 77%  Patient Saturations on 2 Liters of oxygen while Ambulating = 88-94%  Please briefly explain why patient needs home oxygen: Pt not able to maintain oxygen saturations >90% on RA and exhibits dizziness with mobility.    Wray Kearns, [place], DPT [phone]

## 2015-05-15 NOTE — Progress Notes (Signed)
Patient ID: Shirley Leblanc, female   DOB: 11-Dec-1945, 69 y.o.   MRN: [id number] 1 Day Post-Op  Subjective: Pt c/o some soreness but otherwise tolerating a regular diet with no issues.  Voiding well.  Objective: Vital signs in last 24 hours: Temp:  [97.3 F (36.3 C)-98.4 F (36.9 C)] 97.9 F (36.6 C) (10/18 0600) Pulse Rate:  [62-99] 96 (10/18 0600) Resp:  [14-28] 18 (10/18 0605) BP: (119-158)/(51-99) 119/67 mmHg (10/18 0600) SpO2:  [87 %-99 %] 94 % (10/18 0605) Last BM Date: 05/14/15 (little)  Intake/Output from previous day: 10/17 0701 - 10/18 0700 In: 1240 [P.O.:240; I.V.:1000] Out: 20 [Blood:20] Intake/Output this shift:    PE: Abd: soft, appropriately tender, +BS, ND, obese, incisions c/d/i  Lab Results:   Recent Labs  05/14/15 0253 05/15/15 0238  WBC 7.4 11.8*  HGB 12.0 12.2  HCT 36.7 37.9  PLT 177 198   BMET  Recent Labs  05/14/15 0253 05/15/15 0238  NA 140 139  K 3.6 3.7  CL 99* 99*  CO2 33* 30  GLUCOSE 103* 122*  BUN 5* <5*  CREATININE 0.63 0.73  CALCIUM 9.4 9.7   PT/INR No results for input(s): LABPROT, INR in the last 72 hours. CMP     Component Value Date/Time   NA 139 05/15/2015 0238   K 3.7 05/15/2015 0238   CL 99* 05/15/2015 0238   CO2 30 05/15/2015 0238   GLUCOSE 122* 05/15/2015 0238   BUN <5* 05/15/2015 0238   CREATININE 0.73 05/15/2015 0238   CALCIUM 9.7 05/15/2015 0238   PROT 6.3* 05/10/2015 0515   ALBUMIN 3.5 05/10/2015 0515   AST 19 05/10/2015 0515   ALT 17 05/10/2015 0515   ALKPHOS 75 05/10/2015 0515   BILITOT 0.5 05/10/2015 0515   GFRNONAA >60 05/15/2015 0238   GFRAA >60 05/15/2015 0238   Lipase     Component Value Date/Time   LIPASE 23 05/09/2015 1047       Studies/Results: No results found.  Anti-infectives: Anti-infectives    Start     Dose/Rate Route Frequency Ordered Stop   05/14/15 1345  metroNIDAZOLE (FLAGYL) IVPB 500 mg     500 mg 100 mL/hr over 60 Minutes Intravenous To Surgery 05/14/15 1330  05/14/15 1345   05/14/15 1324  ciprofloxacin (CIPRO) 400 MG/200ML IVPB    Comments:  Merrilyn Puma   : cabinet override      05/14/15 1324 05/14/15 1346   05/13/15 0900  metroNIDAZOLE (FLAGYL) IVPB 500 mg  Status:  Discontinued     500 mg 100 mL/hr over 60 Minutes Intravenous On call to O.R. 05/13/15 0845 05/14/15 1611   05/13/15 0900  ciprofloxacin (CIPRO) IVPB 400 mg  Status:  Discontinued     400 mg 200 mL/hr over 60 Minutes Intravenous On call to O.R. 05/13/15 0845 05/14/15 1611       Assessment/Plan  POD 1, s/p lap chole   -doing well.  Transition to oral pain meds -tolerating a regular diet -patient is surgically stable for DC when medically stable. -she does live at home by herself, having PT eval her post op would likely be a good idea. -follow up in 3 weeks arranged with [place] in our office.  LOS: 6 days    Zenaya Ulatowski E 05/15/2015, 7:42 AM Pager: [phone]

## 2015-05-15 NOTE — Progress Notes (Signed)
Patient Profile: 69 y.o. female with a history of morbid obesity, HLD, HTN, chronic dyspnea and chest pain who presented to [facility] with N/V & abdominal pain and felt to have probable symptomatic cholelithiasis x2-4 weeks duration. Stress test was negative for ischemia with an EF of 67% and no WMA. She was seen by [facility] and cleared for surgery. She is now day 1 s/p successful lap chole.   Subjective: No chest pain or dyspnea.   Objective: Vital signs in last 24 hours: Temp:  [97.3 F (36.3 C)-98.4 F (36.9 C)] 97.9 F (36.6 C) (10/18 0600) Pulse Rate:  [82-99] 84 (10/18 1023) Resp:  [14-28] 18 (10/18 0605) BP: (100-158)/(57-99) 100/58 mmHg (10/18 1023) SpO2:  [87 %-99 %] 94 % (10/18 0605) Last BM Date: 05/14/15 (smear. pre surgery)  Intake/Output from previous day: 10/17 0701 - 10/18 0700 In: 1240 [P.O.:240; I.V.:1000] Out: 20 [Blood:20] Intake/Output this shift: Total I/O In: 120 [P.O.:120] Out: -   Medications Current Facility-Administered Medications  Medication Dose Route Frequency Provider Last Rate Last Dose  . antiseptic oral rinse (CPC / CETYLPYRIDINIUM CHLORIDE 0.05%) solution 7 mL  7 mL Mouth Rinse BID Charlynne Cousins, MD   7 mL at 05/15/15 1100  . aspirin EC tablet 81 mg  81 mg Oral Daily Charlynne Cousins, MD   81 mg at 05/15/15 1100  . atorvastatin (LIPITOR) tablet 10 mg  10 mg Oral Daily Charlynne Cousins, MD   10 mg at 05/15/15 1059  . cholecalciferol (VITAMIN D) tablet 2,000 Units  2,000 Units Oral Daily Albertine Patricia, MD   2,000 Units at 05/15/15 1101  . clonazePAM (KLONOPIN) tablet 0.5 mg  0.5 mg Oral BID PRN Charlynne Cousins, MD      . guaiFENesin [facility]) 12 hr tablet 1,200 mg  1,200 mg Oral BID Albertine Patricia, MD   1,200 mg at 05/15/15 1100  . heparin injection 5,000 Units  5,000 Units Subcutaneous 3 times per day Charlynne Cousins, MD   5,000 Units at 05/15/15 0600  . HYDROcodone-acetaminophen (NORCO) 10-325 MG per tablet 1-2 tablet   1-2 tablet Oral Q4H PRN Charlynne Cousins, MD   2 tablet at 05/15/15 [phone]  . Influenza vac split quadrivalent PF (FLUARIX) injection 0.5 mL  0.5 mL Intramuscular Prior to discharge Charlynne Cousins, MD      . lactated ringers infusion   Intravenous Continuous Lillia Abed, MD 10 mL/hr at 05/14/15 1326    . levothyroxine (SYNTHROID, LEVOTHROID) tablet 50 mcg  50 mcg Oral QAC breakfast Charlynne Cousins, MD   50 mcg at 05/15/15 7096  . lisinopril (PRINIVIL,ZESTRIL) tablet 40 mg  40 mg Oral Daily Albertine Patricia, MD   40 mg at 05/14/15 1752  . ondansetron (ZOFRAN) tablet 4 mg  4 mg Oral Q6H PRN Charlynne Cousins, MD   4 mg at 05/14/15 1751   Or  . ondansetron [facility]) injection 4 mg  4 mg Intravenous Q6H PRN Charlynne Cousins, MD   4 mg at 05/14/15 0840  . pantoprazole (PROTONIX) injection 40 mg  40 mg Intravenous Q12H Charlynne Cousins, MD   40 mg at 05/15/15 1100  . polyethylene glycol (MIRALAX / GLYCOLAX) packet 17 g  17 g Oral Daily PRN Charlynne Cousins, MD      . potassium chloride SA (K-DUR,KLOR-CON) CR tablet 20 mEq  20 mEq Oral Once Albertine Patricia, MD   20 mEq at 05/13/15 1309  . sertraline (  ZOLOFT) tablet 200 mg  200 mg Oral Daily Charlynne Cousins, MD   200 mg at 05/15/15 1100    PE: General appearance: alert, cooperative and no distress Neck: no carotid bruit and no JVD Lungs: clear to auscultation bilaterally Heart: regular rate and rhythm, S1, S2 normal, no murmur, click, rub or gallop Extremities: no LEE Pulses: 2+ and symmetric Skin: warm and dry Neurologic: Grossly normal  Lab Results:   Recent Labs  05/14/15 0253 05/15/15 0238  WBC 7.4 11.8*  HGB 12.0 12.2  HCT 36.7 37.9  PLT 177 198   BMET  Recent Labs  05/13/15 0419 05/14/15 0253 05/15/15 0238  NA 140 140 139  K 3.3* 3.6 3.7  CL 99* 99* 99*  CO2 32 33* 30  GLUCOSE 124* 103* 122*  BUN <5* 5* <5*  CREATININE 0.66 0.63 0.73  CALCIUM 9.5 9.4 9.7    Assessment/Plan    Active  Problems:   RUQ pain   Essential hypertension   Epigastric abdominal pain   Precordial chest pain  Day 1 s/p successful lap chole. Doing well from a cardiac standpoint. No chest pain or dyspnea. Volume stable. NSR on telemetry with no arrhthymias. BP stable. Continue ASA, atorvastatin and lisinopril. Cardiology will sign-off. F/u in our office as needed.    LOS: 6 days    Brittainy M. Ladoris Gene 05/15/2015 1:17 PM  Attending Note:   The patient was seen and examined.  Agree with assessment and plan as noted above.  Changes made to the above note as needed.  Pt is doing well from a cardiac standpoint. No active cardiac issues.  Will sign off.  Call for questions   Ramond Dial., MD, [facility] 05/15/2015, 3:13 PM [address]  [place] Pager [phone]

## 2015-05-15 NOTE — Care Management Note (Addendum)
Case Management Note Marvetta Gibbons RN, BSN Unit 2W-Case Manager [phone]  Patient Details  Name: Shirley Leblanc MRN: [id number] Date of Birth: [birth year]  Subjective/Objective:    Pt admitted RUQ abd pain s/p lap chole                Action/Plan: PTA  Pt lived at home alone- has cane, RW at home- orders placed for HH-RN/PT/OT/aide- in to speak with pt and granddaughter (Chrissy) at bedside- list of [address] [facility] agencies for [facility] offered- per granddaughter choice- would like to use [place] for [facility] services- pt agreeable- granddaughter contact # [phone]- pt will also need home 02 - awaiting order- and will then call [place] regarding DME- 02 needs. Referral for [facility] services called to [facility] with [facility] referral accepted for HH-RN/PT/OT/aide.  Expected Discharge Date:       05/16/15           Expected Discharge Plan:  [place]  In-House Referral:     Discharge planning Services  CM Consult  Post Acute Care Choice:  Durable Medical Equipment, Home Health Choice offered to:  Patient  DME Arranged:  Oxygen DME Agency:  [place] Arranged:  PT, OT, Nurse's Aide, RN [facility] Agency:  [place]  Status of Service:  Completed, signed off  Medicare Important Message Given:    Date Medicare IM Given:    Medicare IM give by:    Date Additional Medicare IM Given:    Additional Medicare Important Message give by:     If discussed at [place] of Stay Meetings, dates discussed:    Additional Comments:  Dawayne Patricia, RN 05/15/2015, 3:50 PM

## 2015-05-16 ENCOUNTER — Observation Stay (HOSPITAL_COMMUNITY): Payer: Medicare Other

## 2015-05-16 DIAGNOSIS — E871 Hypo-osmolality and hyponatremia: Secondary | ICD-10-CM | POA: Diagnosis not present

## 2015-05-16 DIAGNOSIS — I10 Essential (primary) hypertension: Secondary | ICD-10-CM | POA: Diagnosis not present

## 2015-05-16 DIAGNOSIS — I480 Paroxysmal atrial fibrillation: Secondary | ICD-10-CM | POA: Insufficient documentation

## 2015-05-16 DIAGNOSIS — J9601 Acute respiratory failure with hypoxia: Secondary | ICD-10-CM | POA: Insufficient documentation

## 2015-05-16 DIAGNOSIS — E785 Hyperlipidemia, unspecified: Secondary | ICD-10-CM | POA: Diagnosis not present

## 2015-05-16 DIAGNOSIS — R1011 Right upper quadrant pain: Secondary | ICD-10-CM | POA: Diagnosis not present

## 2015-05-16 DIAGNOSIS — I110 Hypertensive heart disease with heart failure: Secondary | ICD-10-CM | POA: Diagnosis not present

## 2015-05-16 DIAGNOSIS — Z23 Encounter for immunization: Secondary | ICD-10-CM | POA: Diagnosis not present

## 2015-05-16 DIAGNOSIS — Z6841 Body Mass Index (BMI) 40.0 and over, adult: Secondary | ICD-10-CM | POA: Diagnosis not present

## 2015-05-16 DIAGNOSIS — Z9049 Acquired absence of other specified parts of digestive tract: Secondary | ICD-10-CM | POA: Insufficient documentation

## 2015-05-16 DIAGNOSIS — I4891 Unspecified atrial fibrillation: Secondary | ICD-10-CM

## 2015-05-16 DIAGNOSIS — I5032 Chronic diastolic (congestive) heart failure: Secondary | ICD-10-CM | POA: Diagnosis not present

## 2015-05-16 DIAGNOSIS — K8000 Calculus of gallbladder with acute cholecystitis without obstruction: Secondary | ICD-10-CM | POA: Diagnosis not present

## 2015-05-16 DIAGNOSIS — J9811 Atelectasis: Secondary | ICD-10-CM | POA: Diagnosis not present

## 2015-05-16 DIAGNOSIS — F329 Major depressive disorder, single episode, unspecified: Secondary | ICD-10-CM | POA: Diagnosis not present

## 2015-05-16 DIAGNOSIS — K219 Gastro-esophageal reflux disease without esophagitis: Secondary | ICD-10-CM | POA: Diagnosis not present

## 2015-05-16 MED ORDER — POLYETHYLENE GLYCOL 3350 17 G PO PACK
17.0000 g | PACK | Freq: Every day | ORAL | Status: DC
  Administered 2015-05-16 – 2015-05-17 (×2): 17 g via ORAL
  Filled 2015-05-16 (×2): qty 1

## 2015-05-16 MED ORDER — PANTOPRAZOLE SODIUM 40 MG PO TBEC
40.0000 mg | DELAYED_RELEASE_TABLET | Freq: Two times a day (BID) | ORAL | Status: DC
  Administered 2015-05-16 – 2015-05-17 (×2): 40 mg via ORAL
  Filled 2015-05-16 (×2): qty 1

## 2015-05-16 MED ORDER — APIXABAN 5 MG PO TABS
5.0000 mg | ORAL_TABLET | Freq: Two times a day (BID) | ORAL | Status: DC
  Administered 2015-05-16 – 2015-05-17 (×3): 5 mg via ORAL
  Filled 2015-05-16 (×3): qty 1

## 2015-05-16 MED ORDER — SENNOSIDES-DOCUSATE SODIUM 8.6-50 MG PO TABS
2.0000 | ORAL_TABLET | Freq: Every evening | ORAL | Status: DC | PRN
  Administered 2015-05-16 – 2015-05-17 (×2): 2 via ORAL
  Filled 2015-05-16 (×2): qty 2

## 2015-05-16 MED ORDER — CYCLOBENZAPRINE HCL 10 MG PO TABS
5.0000 mg | ORAL_TABLET | Freq: Once | ORAL | Status: AC
  Administered 2015-05-16: 5 mg via ORAL
  Filled 2015-05-16: qty 1

## 2015-05-16 NOTE — Progress Notes (Addendum)
PROGRESS NOTE    Shirley Leblanc KPT:[id number] DOB: 08-19-1945 DOA: 05/09/2015 PCP: [name], MD  HPI/Brief narrative 69 y.o. female with a history of morbid obesity, HLD, HTN, chronic dyspnea and chest pain who presented to [facility] with N/V & abdominal pain and felt to have probable symptomatic cholelithiasis x2-4 weeks duration. Stress test was negative for ischemia with an EF of 67% and no WMA. She was seen by [facility] and cleared for surgery. She is now day 1 s/p successful lap chole. Developed A. fib with RVR 10/18 night. Cardiology will be consulted 10/19.   Assessment/Plan:  Paroxysmal A. fib. With RVR - Likely precipitated by surgical stress and dyspnea  - CHADS2VASC is 68 ( female, age, HTN)  - Cardiology was reconsulted 10/19: Have started Eliquis 5 MG twice a day after clearance from general surgery. Follow 2-D echo. Rate controlled at rest but has RVR with minimal activity-monitor on telemetry.  Calculus of gallbladder with acute cholecystitis - Status post laparoscopic cholecystectomy 10/17 after preop cardiac clearance - Has done well from surgical standpoint and cleared for discharge home by surgery. No further vomiting since 10/18.  Acute respiratory failure with hypoxia - Possibly secondary to basilar atelectasis seen on chest x-ray and acute on chronic diastolic CHF complicated by IV fluid resuscitation early on in admission and A. fib with RVR. - Incentive spirometer and agree with Lasix - Echo results as below. EKG confirms A. fib with RVR at 104 bpm without acute findings. - We will need evaluation for home oxygen prior to discharge  Essential hypertension - Controlled on lisinopril. HCTZ discontinued due to hyponatremia  Hyperlipidemia - Continue statins  Hypothyroid - Continue Synthroid  Hypokalemia - Replaced  Chronic left scalp numbness - Unclear etiology. States that she has had this for years. Outpatient follow-up with PCP.    DVT prophylaxis: Started  on full dose Eliquis on 10/19 Code Status: Full Family Communication: None at bedside Disposition Plan: DC home when medically stable.   Consultants:  Cardiology  General Surgery  Procedures:  Status post laparoscopic cholecystectomy 10/17  2-D echo 05/12/15: Study Conclusions  - Left ventricle: The cavity size was normal. Wall thickness was increased in a pattern of mild LVH. Systolic function was normal. The estimated ejection fraction was in the range of 55% to 60%. Wall motion was normal; there were no regional wall motion abnormalities. Doppler parameters are consistent with abnormal left ventricular relaxation (grade 1 diastolic dysfunction).  Antibiotics:  IV Cipro 1 dose 10/17  IV metronidazole 1 dose 10/17  Subjective: Dyspnea on exertion. No chest pain. Chronic numbness left occipital scalp region. No vomiting since yesterday. Denies chest pain or palpitations. As per RN, tachycardic in the 130s with minimal activity this morning.   Objective: Filed Vitals:   05/15/15 1023 05/15/15 1404 05/15/15 2007 05/16/15 0445  BP: 100/58 105/51 108/90 134/67  Pulse: 84 80 100 96  Temp:  98.2 F (36.8 C) 97.4 F (36.3 C) 98.4 F (36.9 C)  TempSrc:  Oral Oral Oral  Resp:  19 19 18   Height:      Weight:      SpO2:  94% 94% 91%    Intake/Output Summary (Last 24 hours) at 05/16/15 1221 Last data filed at 05/16/15 0827  Gross per 24 hour  Intake    800 ml  Output      0 ml  Net    800 ml   Filed Weights   05/09/15 1050 05/10/15 2220  Weight:  118.842 kg (262 lb) 123.832 kg (273 lb)     Exam:  General exam: pleasant elderly female sitting up comfortably on chair this morning  Respiratory system: reduced breath sounds in the bases without wheezing, rhonchi or crackles. No increased work of breathing. Cardiovascular system: S1 & S2 heard, irregularly irregularmurmurs, gallops, clicks. Trace bilateral ankle edema.telemetry: A. fib since approximately  11 PM on 10/18. Mostly rate controlled but intermittent RVR in the 120s-130s, possibly related to activity.  Gastrointestinal system: Abdomen is nondistended, soft and nontender. Normal bowel sounds heard. Central nervous system: Alert and oriented. No focal neurological deficits. Extremities: Symmetric 5 x 5 power.   Data Reviewed: Basic Metabolic Panel:  Recent Labs Lab 05/10/15 0515 05/12/15 0400 05/13/15 0419 05/14/15 0253 05/15/15 0238  NA 135 133* 140 140 139  K 4.0 4.0 3.3* 3.6 3.7  CL 102 96* 99* 99* 99*  CO2 27 28 32 33* 30  GLUCOSE 118* 114* 124* 103* 122*  BUN 13 9 <5* 5* <5*  CREATININE 0.69 0.80 0.66 0.63 0.73  CALCIUM 9.0 9.4 9.5 9.4 9.7   Liver Function Tests:  Recent Labs Lab 05/10/15 0515  AST 19  ALT 17  ALKPHOS 75  BILITOT 0.5  PROT 6.3*  ALBUMIN 3.5   No results for input(s): LIPASE, AMYLASE in the last 168 hours. No results for input(s): AMMONIA in the last 168 hours. CBC:  Recent Labs Lab 05/10/15 0515 05/12/15 0400 05/14/15 0253 05/15/15 0238  WBC 7.6 10.3 7.4 11.8*  HGB 12.9 12.7 12.0 12.2  HCT 40.0 39.2 36.7 37.9  MCV 92.2 91.8 91.3 90.7  PLT 212 220 177 198   Cardiac Enzymes: No results for input(s): CKTOTAL, CKMB, CKMBINDEX, TROPONINI in the last 168 hours. BNP (last 3 results) No results for input(s): PROBNP in the last 8760 hours. CBG: No results for input(s): GLUCAP in the last 168 hours.  No results found for this or any previous visit (from the past 240 hour(s)).       Studies: Dg Chest Port 1 View  05/15/2015  CLINICAL DATA:  Hypoxia today. EXAM: PORTABLE CHEST 1 VIEW COMPARISON:  05/11/2015 FINDINGS: Heart size and pulmonary vascularity are normal. Small focal areas of atelectasis at both lung bases and in the medial aspect of the right upper lobe. No effusions. No lung consolidation. No acute osseous abnormality. Spinal cord stimulator in place in the mid thoracic spine. Chronic deformity of the proximal left  humerus due to an old fracture. IMPRESSION: New slight bilateral atelectasis. Electronically Signed   By: Lorriane Shire M.D.   On: 05/15/2015 10:32        Scheduled Meds: . antiseptic oral rinse  7 mL Mouth Rinse BID  . apixaban  5 mg Oral BID  . aspirin EC  81 mg Oral Daily  . atorvastatin  10 mg Oral Daily  . cholecalciferol  2,000 Units Oral Daily  . furosemide  20 mg Oral Daily  . guaiFENesin  1,200 mg Oral BID  . levothyroxine  50 mcg Oral QAC breakfast  . lisinopril  40 mg Oral Daily  . pantoprazole (PROTONIX) IV  40 mg Intravenous Q12H  . polyethylene glycol  17 g Oral Daily  . potassium chloride  20 mEq Oral Once  . sertraline  200 mg Oral Daily   Continuous Infusions: . lactated ringers 10 mL/hr at 05/14/15 1326    Active Problems:   RUQ pain   Essential hypertension   Epigastric abdominal pain   Precordial chest  pain   Atrial fibrillation ([place])    Time spent: 40 minutes.    Vernell Leep, MD, FACP, FHM. [facility] Pager [phone]  If 7PM-7AM, please contact night-coverage [email or web address] Password [email or web address] 05/16/2015, 12:21 PM    LOS: 7 days

## 2015-05-16 NOTE — Progress Notes (Signed)
Patient ID: Shirley Leblanc, female   DOB: 1945-12-31, 69 y.o.   MRN: [id number] 2 Days Post-Op  Subjective: Pt feels well this morning.  Had some slight nausea and one episode of emesis yesterday after she ate too much for lunch.  She ate half her dinner and felt great.  She is eating this morning with no nausea.  C/o mild SOB with deep inspiration secondary to pain.  RN was concerned about increaseing HR to 130s with mobilization  Objective: Vital signs in last 24 hours: Temp:  [97.4 F (36.3 C)-98.4 F (36.9 C)] 98.4 F (36.9 C) (10/19 0445) Pulse Rate:  [80-100] 96 (10/19 0445) Resp:  [18-19] 18 (10/19 0445) BP: (100-134)/(51-90) 134/67 mmHg (10/19 0445) SpO2:  [91 %-94 %] 91 % (10/19 0445) Last BM Date: 05/14/15  Intake/Output from previous day: 10/18 0701 - 10/19 0700 In: 560 [P.O.:560] Out: -  Intake/Output this shift:    PE: Abd: soft, appropriately tender, +BS, ND, incisions c/d/i Heart: regular Lungs: CTAB  Lab Results:   Recent Labs  05/14/15 0253 05/15/15 0238  WBC 7.4 11.8*  HGB 12.0 12.2  HCT 36.7 37.9  PLT 177 198   BMET  Recent Labs  05/14/15 0253 05/15/15 0238  NA 140 139  K 3.6 3.7  CL 99* 99*  CO2 33* 30  GLUCOSE 103* 122*  BUN 5* <5*  CREATININE 0.63 0.73  CALCIUM 9.4 9.7   PT/INR No results for input(s): LABPROT, INR in the last 72 hours. CMP     Component Value Date/Time   NA 139 05/15/2015 0238   K 3.7 05/15/2015 0238   CL 99* 05/15/2015 0238   CO2 30 05/15/2015 0238   GLUCOSE 122* 05/15/2015 0238   BUN <5* 05/15/2015 0238   CREATININE 0.73 05/15/2015 0238   CALCIUM 9.7 05/15/2015 0238   PROT 6.3* 05/10/2015 0515   ALBUMIN 3.5 05/10/2015 0515   AST 19 05/10/2015 0515   ALT 17 05/10/2015 0515   ALKPHOS 75 05/10/2015 0515   BILITOT 0.5 05/10/2015 0515   GFRNONAA >60 05/15/2015 0238   GFRAA >60 05/15/2015 0238   Lipase     Component Value Date/Time   LIPASE 23 05/09/2015 1047       Studies/Results: Dg Chest Port 1  View  05/15/2015  CLINICAL DATA:  Hypoxia today. EXAM: PORTABLE CHEST 1 VIEW COMPARISON:  05/11/2015 FINDINGS: Heart size and pulmonary vascularity are normal. Small focal areas of atelectasis at both lung bases and in the medial aspect of the right upper lobe. No effusions. No lung consolidation. No acute osseous abnormality. Spinal cord stimulator in place in the mid thoracic spine. Chronic deformity of the proximal left humerus due to an old fracture. IMPRESSION: New slight bilateral atelectasis. Electronically Signed   By: Lorriane Shire M.D.   On: 05/15/2015 10:32    Anti-infectives: Anti-infectives    Start     Dose/Rate Route Frequency Ordered Stop   05/14/15 1345  metroNIDAZOLE (FLAGYL) IVPB 500 mg     500 mg 100 mL/hr over 60 Minutes Intravenous To Surgery 05/14/15 1330 05/14/15 1345   05/14/15 1324  ciprofloxacin (CIPRO) 400 MG/200ML IVPB    Comments:  Merrilyn Puma   : cabinet override      05/14/15 1324 05/14/15 1346   05/13/15 0900  metroNIDAZOLE (FLAGYL) IVPB 500 mg  Status:  Discontinued     500 mg 100 mL/hr over 60 Minutes Intravenous On call to O.R. 05/13/15 0845 05/14/15 1611   05/13/15 0900  ciprofloxacin (CIPRO) IVPB 400 mg  Status:  Discontinued     400 mg 200 mL/hr over 60 Minutes Intravenous On call to O.R. 05/13/15 0845 05/14/15 1611       Assessment/Plan   POD 1, s/p lap chole  -patient doing well surgically this morning.  Her nausea has resolved -patient is surgically stable for dc home, when medically stable -will defer tachycardia/SOB to primary service, my suspicion for anything major such as PE, etc is low given she has been on chemical prophylaxis since admission.  LOS: 7 days    Jameison Haji E 05/16/2015, 7:53 AM Pager: [phone]

## 2015-05-16 NOTE — Progress Notes (Addendum)
Patient Profile: 69 y.o. female with a history of morbid obesity, HLD, HTN, chronic dyspnea and chest pain who presented to [facility] with N/V & abdominal pain and felt to have probable symptomatic cholelithiasis x2-4 weeks duration. Stress test was negative for ischemia with an EF of 67% and no WMA. She was seen by [facility] and cleared for surgery. She is now day 1 s/p successful lap chole.   Subjective: No chest pain or dyspnea.  Developed atrial fib with RVR overnight .  Objective: Vital signs in last 24 hours: Temp:  [97.4 F (36.3 C)-98.4 F (36.9 C)] 98.4 F (36.9 C) (10/19 0445) Pulse Rate:  [80-100] 96 (10/19 0445) Resp:  [18-19] 18 (10/19 0445) BP: (105-134)/(51-90) 134/67 mmHg (10/19 0445) SpO2:  [91 %-94 %] 91 % (10/19 0445) Last BM Date: 05/14/15  Intake/Output from previous day: 10/18 0701 - 10/19 0700 In: 560 [P.O.:560] Out: -  Intake/Output this shift: Total I/O In: 360 [P.O.:360] Out: -   Medications Current Facility-Administered Medications  Medication Dose Route Frequency Provider Last Rate Last Dose  . antiseptic oral rinse (CPC / CETYLPYRIDINIUM CHLORIDE 0.05%) solution 7 mL  7 mL Mouth Rinse BID Charlynne Cousins, MD   7 mL at 05/15/15 1100  . aspirin EC tablet 81 mg  81 mg Oral Daily Charlynne Cousins, MD   81 mg at 05/15/15 1100  . atorvastatin (LIPITOR) tablet 10 mg  10 mg Oral Daily Charlynne Cousins, MD   10 mg at 05/15/15 1059  . cholecalciferol (VITAMIN D) tablet 2,000 Units  2,000 Units Oral Daily Albertine Patricia, MD   2,000 Units at 05/15/15 1101  . clonazePAM (KLONOPIN) tablet 0.5 mg  0.5 mg Oral BID PRN Charlynne Cousins, MD   0.5 mg at 05/15/15 2159  . furosemide (LASIX) tablet 20 mg  20 mg Oral Daily Albertine Patricia, MD      . guaiFENesin (MUCINEX) 12 hr tablet 1,200 mg  1,200 mg Oral BID Silver Huguenin Elgergawy, MD   1,200 mg at 05/15/15 2149  . heparin injection 5,000 Units  5,000 Units Subcutaneous 3 times per day Charlynne Cousins, MD    5,000 Units at 05/16/15 [phone]  . HYDROcodone-acetaminophen (NORCO) 10-325 MG per tablet 1-2 tablet  1-2 tablet Oral Q4H PRN Charlynne Cousins, MD   2 tablet at 05/15/15 2149  . Influenza vac split quadrivalent PF (FLUARIX) injection 0.5 mL  0.5 mL Intramuscular Prior to discharge Charlynne Cousins, MD      . lactated ringers infusion   Intravenous Continuous Lillia Abed, MD 10 mL/hr at 05/14/15 1326    . levothyroxine (SYNTHROID, LEVOTHROID) tablet 50 mcg  50 mcg Oral QAC breakfast Charlynne Cousins, MD   50 mcg at 05/16/15 0254  . lisinopril (PRINIVIL,ZESTRIL) tablet 40 mg  40 mg Oral Daily Albertine Patricia, MD   40 mg at 05/14/15 1752  . ondansetron (ZOFRAN) tablet 4 mg  4 mg Oral Q6H PRN Charlynne Cousins, MD   4 mg at 05/16/15 0737   Or  . ondansetron [facility]) injection 4 mg  4 mg Intravenous Q6H PRN Charlynne Cousins, MD   4 mg at 05/14/15 0840  . pantoprazole (PROTONIX) injection 40 mg  40 mg Intravenous Q12H Charlynne Cousins, MD   40 mg at 05/15/15 2150  . polyethylene glycol (MIRALAX / GLYCOLAX) packet 17 g  17 g Oral Daily Saverio Danker, PA-C      . potassium chloride SA (  K-DUR,KLOR-CON) CR tablet 20 mEq  20 mEq Oral Once Albertine Patricia, MD   20 mEq at 05/13/15 1309  . sertraline (ZOLOFT) tablet 200 mg  200 mg Oral Daily Charlynne Cousins, MD   200 mg at 05/15/15 1100    PE: General appearance: alert, cooperative and no distress Neck: no carotid bruit and no JVD Lungs: clear to auscultation bilaterally Heart: regular rate and rhythm, S1, S2 normal, no murmur, click, rub or gallop Extremities: no LEE Pulses: 2+ and symmetric Skin: warm and dry Neurologic: Grossly normal  Lab Results:   Recent Labs  05/14/15 0253 05/15/15 0238  WBC 7.4 11.8*  HGB 12.0 12.2  HCT 36.7 37.9  PLT 177 198   BMET  Recent Labs  05/14/15 0253 05/15/15 0238  NA 140 139  K 3.6 3.7  CL 99* 99*  CO2 33* 30  GLUCOSE 103* 122*  BUN 5* <5*  CREATININE 0.63 0.73  CALCIUM  9.4 9.7    Assessment/Plan    Active Problems:   RUQ pain   Essential hypertension   Epigastric abdominal pain   Precordial chest pain   Atrial fibrillation ([facility])  1. Paroxysmal atrial fib:  CHADS2VASC is 12 ( female, age, HTN)  Likely due to stress of surgery . Will start Eliquis 5 bid ( discussed with Saverio Danker)  Echo today  Rate is well controlled .  Could likely be discharged to home from our standpoint tomorrow.  2. Essential HTN:  BP is well controlled.     Ivonne [place], Wonda Cheng, MD  05/16/2015 11:33 AM    [address],  [place], [place]  [address] Pager [phone] Phone: [phone]; Fax: [phone]   [facility]  [address] [place], [place]  [address] [phone]   Fax [phone]    LOS: 7 days

## 2015-05-16 NOTE — Progress Notes (Signed)
PT Cancellation Note  Patient Details Name: Shirley Leblanc MRN: [id number] DOB: 1946-03-31   Cancelled Treatment:    Reason Eval/Treat Not Completed: Medical issues which prohibited therapy  Pt with new diagnosis of A-fib this AM after stat EKG. Will await initiation of anticoagulation and until pt is therapeutic prior to PT treatment. Will follow up next available time.  [place] 05/16/2015, 11:04 AM Wray Kearns, [place], DPT [phone]

## 2015-05-17 DIAGNOSIS — E876 Hypokalemia: Secondary | ICD-10-CM | POA: Diagnosis present

## 2015-05-17 DIAGNOSIS — E785 Hyperlipidemia, unspecified: Secondary | ICD-10-CM | POA: Diagnosis present

## 2015-05-17 DIAGNOSIS — I10 Essential (primary) hypertension: Secondary | ICD-10-CM | POA: Diagnosis not present

## 2015-05-17 DIAGNOSIS — I480 Paroxysmal atrial fibrillation: Secondary | ICD-10-CM | POA: Diagnosis not present

## 2015-05-17 DIAGNOSIS — G8929 Other chronic pain: Secondary | ICD-10-CM | POA: Diagnosis present

## 2015-05-17 DIAGNOSIS — E039 Hypothyroidism, unspecified: Secondary | ICD-10-CM | POA: Diagnosis present

## 2015-05-17 DIAGNOSIS — Z23 Encounter for immunization: Secondary | ICD-10-CM | POA: Diagnosis not present

## 2015-05-17 DIAGNOSIS — I5032 Chronic diastolic (congestive) heart failure: Secondary | ICD-10-CM | POA: Diagnosis not present

## 2015-05-17 DIAGNOSIS — I110 Hypertensive heart disease with heart failure: Secondary | ICD-10-CM | POA: Diagnosis not present

## 2015-05-17 DIAGNOSIS — J9601 Acute respiratory failure with hypoxia: Secondary | ICD-10-CM | POA: Diagnosis not present

## 2015-05-17 DIAGNOSIS — R1011 Right upper quadrant pain: Secondary | ICD-10-CM | POA: Diagnosis present

## 2015-05-17 DIAGNOSIS — K219 Gastro-esophageal reflux disease without esophagitis: Secondary | ICD-10-CM | POA: Diagnosis present

## 2015-05-17 DIAGNOSIS — Z9049 Acquired absence of other specified parts of digestive tract: Secondary | ICD-10-CM | POA: Diagnosis not present

## 2015-05-17 DIAGNOSIS — K8000 Calculus of gallbladder with acute cholecystitis without obstruction: Secondary | ICD-10-CM | POA: Diagnosis not present

## 2015-05-17 DIAGNOSIS — E871 Hypo-osmolality and hyponatremia: Secondary | ICD-10-CM | POA: Diagnosis not present

## 2015-05-17 DIAGNOSIS — F329 Major depressive disorder, single episode, unspecified: Secondary | ICD-10-CM | POA: Diagnosis present

## 2015-05-17 DIAGNOSIS — J9811 Atelectasis: Secondary | ICD-10-CM | POA: Diagnosis not present

## 2015-05-17 DIAGNOSIS — M549 Dorsalgia, unspecified: Secondary | ICD-10-CM | POA: Diagnosis present

## 2015-05-17 DIAGNOSIS — Z6841 Body Mass Index (BMI) 40.0 and over, adult: Secondary | ICD-10-CM | POA: Diagnosis not present

## 2015-05-17 DIAGNOSIS — I2510 Atherosclerotic heart disease of native coronary artery without angina pectoris: Secondary | ICD-10-CM | POA: Diagnosis present

## 2015-05-17 LAB — BASIC METABOLIC PANEL
ANION GAP: 6 (ref 5–15)
BUN: 9 mg/dL (ref 6–20)
CHLORIDE: 97 mmol/L — AB (ref 101–111)
CO2: 32 mmol/L (ref 22–32)
Calcium: 8.9 mg/dL (ref 8.9–10.3)
Creatinine, Ser: 0.84 mg/dL (ref 0.44–1.00)
GFR calc non Af Amer: >60 mL/min (ref >60)
Glucose, Bld: 115 mg/dL — ABNORMAL HIGH (ref 65–99)
Potassium: 3.3 mmol/L — ABNORMAL LOW (ref 3.5–5.1)
Sodium: 135 mmol/L (ref 135–145)

## 2015-05-17 MED ORDER — APIXABAN 5 MG PO TABS: 5.0000 mg | ORAL_TABLET | Freq: Two times a day (BID) | ORAL | Status: DC

## 2015-05-17 MED ORDER — FUROSEMIDE 20 MG PO TABS: 20.0000 mg | ORAL_TABLET | Freq: Every day | ORAL | Status: DC

## 2015-05-17 MED ORDER — POTASSIUM CHLORIDE CRYS ER 20 MEQ PO TBCR
40.0000 meq | EXTENDED_RELEASE_TABLET | Freq: Once | ORAL | Status: AC
  Administered 2015-05-17: 40 meq via ORAL
  Filled 2015-05-17: qty 2

## 2015-05-17 NOTE — Progress Notes (Signed)
Physical Therapy Treatment Patient Details Name: Shirley Leblanc MRN: [id number] DOB: 21-Nov-1945 Today's Date: 05/17/2015    History of Present Illness 69 y.o. female with significant history of 2 weeks N & V from gallstone and cardiology is going to clear for surgery if able.  Also noted bibasilar atelectasis and thoracic neurostim device. S/p Lap chole 10/17.    PT Comments    Patient progressing well towards PT goals. Improved ambulation distance today with less effort. Dyspnea improved with mobility today. Pt able to maintain Sp02 87-94% on RA. Education with pt and granddaughter on energy conservation techniques and importance of performing short bouts of activity at home. Pt still fatigues quickly. Pt plans to discharge home today. Encouraged use of RW at home for safety. Pt agreeable. Will follow acutely per current POC.   Follow Up Recommendations  Home health PT;Supervision for mobility/OOB     Equipment Recommendations  None recommended by PT    Recommendations for Other Services       Precautions / Restrictions Precautions Precautions: Fall Restrictions Weight Bearing Restrictions: No    Mobility  Bed Mobility Overal bed mobility: Needs Assistance Bed Mobility: Supine to Sit     Supine to sit: Modified independent (Device/Increase time);HOB elevated        Transfers Overall transfer level: Needs assistance Equipment used: Rolling walker (2 wheeled) Transfers: Sit to/from Stand Sit to Stand: Supervision         General transfer comment: Supervision for safety. Stood from [employer].   Ambulation/Gait Ambulation/Gait assistance: Supervision Ambulation Distance (Feet): 120 Feet Assistive device: Rolling walker (2 wheeled) Gait Pattern/deviations: Step-through pattern;Decreased stride length;Wide base of support Gait velocity: reduced   General Gait Details: Slow, steady gait. 2 short standing rest breaks. Mild dypsnea- reports as premobid. Sp02 ranged from  87-95% on RA. CUes for pursed lip breathing. No dizziness. HR ranged from low 80s-low 100s bpm.   Stairs            Wheelchair Mobility    Modified Rankin (Stroke Patients Only)       Balance Overall balance assessment: Needs assistance Sitting-balance support: Feet supported;No upper extremity supported Sitting balance-Leahy Scale: Good     Standing balance support: During functional activity Standing balance-Leahy Scale: Poor                      Cognition Arousal/Alertness: Awake/alert Behavior During Therapy: WFL for tasks assessed/performed Overall Cognitive Status: Within Functional Limits for tasks assessed                      Exercises      General Comments General comments (skin integrity, edema, etc.): Granddaughter present during session.      Pertinent Vitals/Pain Pain Assessment: No/denies pain    Home Living                      Prior Function            PT Goals (current goals can now be found in the care plan section) Progress towards PT goals: Progressing toward goals    Frequency  Min 3X/week    PT Plan Current plan remains appropriate    Co-evaluation             End of Session Equipment Utilized During Treatment: Gait belt Activity Tolerance: Patient tolerated treatment well Patient left: in bed;with call bell/phone within reach;with family/visitor present;with nursing/sitter in room  Time: 3383-2919 PT Time Calculation (min) (ACUTE ONLY): 18 min  Charges:  $Gait Training: 8-22 mins                    G Codes:      Bertha Lokken A Aryona Sill 05/17/2015, 10:58 AM Wray Kearns, PT, DPT [phone]

## 2015-05-17 NOTE — Care Management Note (Signed)
Case Management Note Marvetta Gibbons RN, BSN Unit 2W-Case Manager [phone]  Patient Details  Name: Shirley Leblanc MRN: [id number] Date of Birth: 1945/10/24  Subjective/Objective:    Pt admitted RUQ abd pain s/p lap chole                Action/Plan: PTA  Pt lived at home alone- has cane, RW at home- orders placed for HH-RN/PT/OT/aide- in to speak with pt and granddaughter (Chrissy) at bedside- list of [facility] agencies for [facility] offered- per granddaughter choice- would like to use [place] for [facility] services- pt agreeable- granddaughter contact # [phone]- pt will also need home 02 - awaiting order- and will then call [facility] regarding DME- 02 needs. Referral for [facility] services called to [facility] with [facility] referral accepted for HH-RN/PT/OT/aide.  Expected Discharge Date:       05/16/15           Expected Discharge Plan:  [place]  In-House Referral:     Discharge planning Services  CM Consult  Post Acute Care Choice:  Durable Medical Equipment, Home Health Choice offered to:  Patient  DME Arranged:  Oxygen DME Agency:  [place] Arranged:  PT, OT, Nurse's Aide, RN [facility] Agency:  [facility]  Status of Service:  Completed, signed off  Medicare Important Message Given:    Date Medicare IM Given:    Medicare IM give by:    Date Additional Medicare IM Given:    Additional Medicare Important Message give by:     If discussed at [place] of Stay Meetings, dates discussed:    Additional Comments:  05/17/15- Marvetta Gibbons RN, BSN - pt started on Eliquis per Cards- benefits check completed- Pt copay will be $72- prior auth not required - spoke with pt and granddaughter at bedside- above info shared regarding copay- pt also given 30 day free card for Eliquis. Pt still requiring 02 on ambulation- will need 02 for discharge- spoke with Jermaine with [facility] who will deliver portable tank to room prior to discharge.    Dawayne Patricia, RN 05/17/2015, 11:58 AM

## 2015-05-17 NOTE — Progress Notes (Signed)
SATURATION QUALIFICATIONS: (This note is used to comply with regulatory documentation for home oxygen)  Patient Saturations on Room Air at Rest = 94%  Patient Saturations on Room Air while Ambulating = 81%  Patient Saturations on 2 Liters of oxygen while Ambulating = 94%  Please briefly explain why patient needs home oxygen:Hx of copd

## 2015-05-17 NOTE — Anesthesia Postprocedure Evaluation (Signed)
  Anesthesia Post-op Note  Patient: Shirley Leblanc  Procedure(s) Performed: Procedure(s): LAPAROSCOPIC CHOLECYSTECTOMY (N/A)  Patient Location: PACU  Anesthesia Type:General  Level of Consciousness: awake  Airway and Oxygen Therapy: Patient Spontanous Breathing  Post-op Pain: mild  Post-op Assessment: Post-op Vital signs reviewed, Patient's Cardiovascular Status Stable, Respiratory Function Stable, Patent Airway, No signs of Nausea or vomiting and Pain level controlled              Post-op Vital Signs: Reviewed and stable  Last Vitals:  Filed Vitals:   05/17/15 1348  BP: 128/89  Pulse: 79  Temp: 36.6 C  Resp: 18    Complications: No apparent anesthesia complications

## 2015-05-17 NOTE — Discharge Instructions (Signed)
[facility] ______[facility] °LAPAROSCOPIC SURGERY: POST OP INSTRUCTIONS °Always review your discharge instruction sheet given to you by the facility where your surgery was performed. °IF YOU HAVE DISABILITY OR FAMILY LEAVE FORMS, YOU MUST BRING THEM TO THE OFFICE FOR PROCESSING.   °DO NOT GIVE THEM TO YOUR DOCTOR. ° °1. A prescription for pain medication may be given to you upon discharge.  Take your pain medication as prescribed, if needed.  If narcotic pain medicine is not needed, then you may take acetaminophen (Tylenol) or ibuprofen (Advil) as needed. °2. Take your usually prescribed medications unless otherwise directed. °3. If you need a refill on your pain medication, please contact your pharmacy.  They will contact our office to request authorization. Prescriptions will not be filled after 5pm or on week-ends. °4. You should follow a light diet the first few days after arrival home, such as soup and crackers, etc.  Be sure to include lots of fluids daily. °5. Most patients will experience some swelling and bruising in the area of the incisions.  Ice packs will help.  Swelling and bruising can take several days to resolve.  °6. It is common to experience some constipation if taking pain medication after surgery.  Increasing fluid intake and taking a stool softener (such as Colace) will usually help or prevent this problem from occurring.  A mild laxative (Milk of Magnesia or Miralax) should be taken according to package instructions if there are no bowel movements after 48 hours. °7. Unless discharge instructions indicate otherwise, you may remove your bandages 24-48 hours after surgery, and you may shower at that time.  You may have steri-strips (small skin tapes) in place directly over the incision.  These strips should be left on the skin for 7-10 days.  If your surgeon used skin glue on the incision, you may shower in 24 hours.  The glue will flake off over the next 2-3 weeks.  Any sutures or  staples will be removed at the office during your follow-up visit. °8. ACTIVITIES:  You may resume regular (light) daily activities beginning the next day--such as daily self-care, walking, climbing stairs--gradually increasing activities as tolerated.  You may have sexual intercourse when it is comfortable.  Refrain from any heavy lifting or straining until approved by your doctor. °a. You may drive when you are no longer taking prescription pain medication, you can comfortably wear a seatbelt, and you can safely maneuver your car and apply brakes. °b. RETURN TO WORK:  __________________________________________________________ °9. You should see your doctor in the office for a follow-up appointment approximately 2-3 weeks after your surgery.  Make sure that you call for this appointment within a day or two after you arrive home to insure a convenient appointment time. °10. OTHER INSTRUCTIONS: __________________________________________________________________________________________________________________________ __________________________________________________________________________________________________________________________ °WHEN TO CALL YOUR DOCTOR: °1. Fever over 101.0 °2. Inability to urinate °3. Continued bleeding from incision. °4. Increased pain, redness, or drainage from the incision. °5. Increasing abdominal pain ° °The clinic staff is available to answer your questions during regular business hours.  Please don’t hesitate to call and ask to speak to one of the nurses for clinical concerns.  If you have a medical emergency, go to the nearest emergency room or call 911.  A surgeon from [facility] is always on call at the hospital. °[address], [address], [place], [place]  [address] ? [address] [address], [place],    [address] °[phone] ? [phone] ? FAX [phone] °Web site:   [email or web address]    Information on my medicine - ELIQUIS  (apixaban)  This medication education was reviewed with me or my healthcare representative as part of my discharge preparation.  The pharmacist that spoke with me during my hospital stay was:  Georgina Peer, [facility]  Why was Eliquis prescribed for you? Eliquis was prescribed for you to reduce the risk of a blood clot forming that can cause a stroke if you have a medical condition called atrial fibrillation (a type of irregular heartbeat).  What do You need to know about Eliquis ? Take your Eliquis TWICE DAILY - one tablet in the morning and one tablet in the evening with or without food. If you have difficulty swallowing the tablet whole please discuss with your pharmacist how to take the medication safely.  Take Eliquis exactly as prescribed by your doctor and DO NOT stop taking Eliquis without talking to the doctor who prescribed the medication.  Stopping may increase your risk of developing a stroke.  Refill your prescription before you run out.  After discharge, you should have regular check-up appointments with your healthcare provider that is prescribing your Eliquis.  In the future your dose may need to be changed if your kidney function or weight changes by a significant amount or as you get older.  What do you do if you miss a dose? If you miss a dose, take it as soon as you remember on the same day and resume taking twice daily.  Do not take more than one dose of ELIQUIS at the same time to make up a missed dose.  Important Safety Information A possible side effect of Eliquis is bleeding. You should call your healthcare provider right away if you experience any of the following: ? Bleeding from an injury or your nose that does not stop. ? Unusual colored urine (red or dark brown) or unusual colored stools (red or black). ? Unusual bruising for unknown reasons. ? A serious fall or if you hit your head (even if there is no bleeding).  Some medicines may interact with Eliquis  and might increase your risk of bleeding or clotting while on Eliquis. To help avoid this, consult your healthcare provider or pharmacist prior to using any new prescription or non-prescription medications, including herbals, vitamins, non-steroidal anti-inflammatory drugs (NSAIDs) and supplements.  This website has more information on Eliquis (apixaban): http://www.eliquis.com/eliquis/home

## 2015-05-17 NOTE — Discharge Summary (Addendum)
Physician Discharge Summary  Shirley Leblanc NLZ:[id number] DOB: 1945-12-05 DOA: 05/09/2015  PCP: [name], MD  Admit date: 05/09/2015 Discharge date: 05/17/2015  Time spent: Greater than 30 minutes  Recommendations for Outpatient Follow-up:  1. Raylene Miyamoto, PA-C, Surgery on 05/30/15 at 10:30 AM 2. Terrill Mohr home health PT, OT and aide 3. Home oxygen arranged via [facility] 4. Dr. [name], PCP in 5 days with repeat labs (CBC & BMP) 5. Dr. Minus Breeding, Cardiology in 2 weeks.  Discharge Diagnoses:  Active Problems:   RUQ pain   Essential hypertension   Epigastric abdominal pain   Precordial chest pain   Atrial fibrillation (HCC)   PAF (paroxysmal atrial fibrillation) ([place])   Acute respiratory failure with hypoxia ([place])   S/P laparoscopic cholecystectomy   Discharge Condition: Improved & Stable  Diet recommendation: Heart healthy diet  Filed Weights   05/10/15 2220 05/17/15 0518 05/17/15 0528  Weight: 123.832 kg (273 lb) 122.108 kg (269 lb 3.2 oz) 121.882 kg (268 lb 11.2 oz)    History of present illness:  69 y.o. female with a history of morbid obesity, HLD, HTN, chronic dyspnea and chest pain who presented to [facility] with N/V & abdominal pain and felt to have probable symptomatic cholelithiasis x2-4 weeks duration. Stress test was negative for ischemia with an EF of 67% and no WMA. She was seen by [facility] and cleared for surgery. She is now s/p successful lap chole. Developed A. fib with RVR 10/18 night. Cardiology will be consulted 10/19.  Hospital Course:   Paroxysmal A. fib. With RVR - Likely precipitated by surgical stress and dyspnea  - CHADS2VASC is 52 ( female, age, HTN)  - Cardiology was reconsulted 10/19: Have started Eliquis 5 MG twice a day after clearance from general surgery. Follow 2-D echo-results as below.  - Patient has reverted to normal sinus rhythm. Discussed with Dr. Acie Fredrickson, who did not recommend any rate control medications at this time and  indicated that if she does have RVR in the future then may consider as outpatient. He has cleared her for discharge home.  Calculus of gallbladder with acute cholecystitis - Status post laparoscopic cholecystectomy 10/17 after preop cardiac clearance - Has done well from surgical standpoint and cleared for discharge home by surgery. No further vomiting since 10/18. Tolerating diet.  Acute respiratory failure with hypoxia - Possibly secondary to basilar atelectasis seen on chest x-ray and acute on chronic diastolic CHF complicated by IV fluid resuscitation early on in admission and A. fib with RVR. - Incentive spirometer and agree with Lasix - Echo results as below.  - as per discussion with RN, patient desaturated to the low 80s on room air with activity and home oxygen arranged.   Essential hypertension - Controlled on lisinopril. HCTZ discontinued due to hyponatremia  Hyperlipidemia - Continue statins  Hypothyroid - Continue Synthroid  Hypokalemia - Replaced  Chronic left scalp numbness - Unclear etiology. States that she has had this for years. Outpatient follow-up with PCP.    Consultants:  Cardiology  General Surgery  Procedures:  Status post laparoscopic cholecystectomy 10/17  2-D echo 05/12/15: Study Conclusions  - Left ventricle: The cavity size was normal. Wall thickness was increased in a pattern of mild LVH. Systolic function was normal. The estimated ejection fraction was in the range of 55% to 60%. Wall motion was normal; there were no regional wall motion abnormalities. Doppler parameters are consistent with abnormal left ventricular relaxation (grade 1 diastolic dysfunction).  Antibiotics:  IV Cipro 1 dose 10/17  IV metronidazole 1 dose 10/17   Discharge Exam:  Complaints: feels better. Breathing improved and denies dyspnea. No pain reported.   Filed Vitals:   05/17/15 1152 05/17/15 1153 05/17/15 1155 05/17/15 1348  BP:     128/89  Pulse:    79  Temp:    97.8 F (36.6 C)  TempSrc:    Oral  Resp:    18  Height:      Weight:      SpO2: 91% 94% 94% 91%    General exam: pleasant elderly female sitting up comfortably on chair this morning  Respiratory system: Clear to auscultation. No increased work of breathing.  Cardiovascular system: S1 & S2 heard, RRR. No murmurs, gallops, clicks. Trace bilateral ankle edema. Telemetry: SR  Gastrointestinal system: Abdomen is nondistended, soft and nontender. Normal bowel sounds heard.  Central nervous system: Alert and oriented. No focal neurological deficits.  Extremities: Symmetric 5 x 5 power.   Discharge Instructions      Discharge Instructions    Call MD for:  difficulty breathing, headache or visual disturbances    Complete by:  As directed      Call MD for:  extreme fatigue    Complete by:  As directed      Call MD for:  persistant dizziness or light-headedness    Complete by:  As directed      Call MD for:  persistant nausea and vomiting    Complete by:  As directed      Call MD for:  severe uncontrolled pain    Complete by:  As directed      Call MD for:  temperature >100.4    Complete by:  As directed      Diet - low sodium heart healthy    Complete by:  As directed      Discharge instructions    Complete by:  As directed   Oxygen via nasal cannula at 2 L/m continuously, especially with activity.     Increase activity slowly    Complete by:  As directed             Medication List    STOP taking these medications        hydrochlorothiazide 12.5 MG capsule  Commonly known as:  MICROZIDE      TAKE these medications        apixaban 5 MG Tabs tablet  Commonly known as:  ELIQUIS  Take 1 tablet (5 mg total) by mouth 2 (two) times daily.     aspirin 81 MG EC tablet  Take 81 mg by mouth daily.     atorvastatin 10 MG tablet  Commonly known as:  LIPITOR  Take 10 mg by mouth daily.     clonazePAM 0.5 MG tablet  Commonly known as:   KLONOPIN  Take 0.5 mg by mouth 2 (two) times daily as needed for anxiety.     furosemide 20 MG tablet  Commonly known as:  LASIX  Take 1 tablet (20 mg total) by mouth daily.     HYDROcodone-acetaminophen 10-325 MG tablet  Commonly known as:  NORCO  Take 1-2 tablets by mouth every 4 (four) hours as needed for moderate pain.     levothyroxine 50 MCG tablet  Commonly known as:  SYNTHROID, LEVOTHROID  Take 50 mcg by mouth daily.     quinapril 40 MG tablet  Commonly known as:  ACCUPRIL  Take 40 mg by mouth  daily.     sertraline 100 MG tablet  Commonly known as:  ZOLOFT  Take 200 mg by mouth daily.     Vitamin D3 2000 UNITS Tabs  Take 2,000 Units by mouth daily.       Follow-up Information    Follow up with LIEPINS, ANDY, PA-C On 05/30/2015.   Specialty:  Surgery   Why:  [facility] Surgery, 10:30am, arrive no later than 10:00am for paperwork   Contact information:   [address] [place] [address] [phone]       Follow up with [facility].   Why:  HH-PT/OT/aide arranged   Contact information:   [address] [place] [address] [phone]       Follow up with [facility].   Why:  Home 02 arranged- portable tank to be delivered to room prior to discharge   Contact information:   [address] [place] [address] [phone]       Follow up with [name], MD. Schedule an appointment as soon as possible for a visit in 5 days.   Specialty:  Internal Medicine   Why:  To be seen with repeat labs (CBC & BMP).   Contact information:   [address] [place] [address] [phone]       Follow up with Minus Breeding, MD. Schedule an appointment as soon as possible for a visit in 2 weeks.   Specialty:  Cardiology   Contact information:   [address] [place] [address] [phone]        The results of significant diagnostics from this  hospitalization (including imaging, microbiology, ancillary and laboratory) are listed below for reference.    Significant Diagnostic Studies: Ct Abdomen Pelvis Wo Contrast  05/09/2015  CLINICAL DATA:  Right side abdominal pain, nausea, vomiting for 2 weeks. Gallstones. EXAM: CT ABDOMEN AND PELVIS WITHOUT CONTRAST TECHNIQUE: Multidetector CT imaging of the abdomen and pelvis was performed following the standard protocol without IV contrast. COMPARISON:  Ultrasound 05/09/2015 FINDINGS: Lung bases are clear.  No effusions.  Heart is normal size. Mild elevation of the right hemidiaphragm. Mild fatty infiltration of the liver without focal abnormality. Small gallstone noted layering within the gallbladder. Spleen, pancreas, adrenals and kidneys have an unremarkable unenhanced appearance. No renal or ureteral stones. No hydronephrosis. 14 mm calcified splenic artery aneurysm in the left upper quadrant. Small duodenal diverticulum off the second portion of the duodenum. Stomach and small bowel are decompressed. Appendix not visualized. Large bowel unremarkable. No free fluid, free air or adenopathy. Prior hysterectomy. No adnexal masses. Urinary bladder is decompressed, grossly unremarkable. Aortic and iliac calcifications without aneurysm. No acute bony abnormality or focal bone lesion. Degenerative disc and facet disease throughout the lumbar spine. Mild levoscoliosis. IMPRESSION: No evidence of renal or ureteral stones.  No hydronephrosis. No acute findings in the abdomen or pelvis. Fatty liver. For 2 mm calcified splenic artery aneurysm. Electronically Signed   By: Rolm Baptise M.D.   On: 05/09/2015 12:29   Dg Chest 2 View  05/09/2015  CLINICAL DATA:  Shortness of breath and chest pain for 3 month EXAM: CHEST  2 VIEW COMPARISON:  Chest radiograph April 25, 2015; chest CT May 02, 2015 FINDINGS: There is mild scarring in the left lower lobe. Lungs elsewhere clear. Heart size and pulmonary vascularity are  normal. No adenopathy. No pneumothorax. There is a thoracic stimulator with the tip  posteriorly at the mid thoracic level. There is postoperative change in the lower cervical spine. There is degenerative change in the thoracic spine. IMPRESSION: No edema or consolidation.  Slight scarring left lower lobe. Electronically Signed   By: Lowella Grip III M.D.   On: 05/09/2015 15:53   Dg Chest 2 View  04/25/2015  CLINICAL DATA:  ongoing intermittent chest pain for 6 months EXAM: CHEST  2 VIEW COMPARISON:  Radiograph 08/16/2014 FINDINGS: Anterior cervical fixation noted. Stimulation electrodes in the lower thoracic spine central canal. Normal cardiac silhouette with ectatic aorta. Central venous pulmonary pulmonary congestion is present. No pulmonary edema. Small RIGHT effusion. IMPRESSION: Central venous congestion small RIGHT effusion. Electronically Signed   By: Suzy Bouchard M.D.   On: 04/25/2015 13:00   Ct Chest Wo Contrast  05/02/2015  CLINICAL DATA:  Shortness of breath, mid chest pain intermittently, productive cough for 2 weeks, small RIGHT pleural effusion on recent chest radiograph EXAM: CT CHEST WITHOUT CONTRAST TECHNIQUE: Multidetector CT imaging of the chest was performed following the standard protocol without IV contrast. COMPARISON:  01/18/2014 CT chest, chest radiograph 04/25/2015 FINDINGS: Scattered atherosclerotic calcifications aorta, coronary arteries and minimally proximal great vessels. No thoracic adenopathy. Minimal dependent density within gallbladder question tiny gallstones. Small hiatal hernia questioned. Calcified splenic artery aneurysm 15 x 12 mm unchanged. Intraspinal stimulator. Lungs clear. No pulmonary infiltrate, pleural effusion or pneumothorax. Mild elevation of RIGHT diaphragm and prominent epicardial fat pads noted. Mild degenerative disc disease changes thoracic spine with evidence of prior cervical spine fusion. IMPRESSION: Small hiatal hernia. Scattered calcified  atherosclerotic plaque including a small calcified splenic artery aneurysm. No acute intra thoracic abnormalities. Suspect minimal cholelithiasis. Electronically Signed   By: Lavonia Dana M.D.   On: 05/02/2015 09:17   Nm Myocar Multi W/spect W/wall Motion / Ef  05/12/2015  CLINICAL DATA:  Precordial chest pain EXAM: MYOCARDIAL IMAGING WITH SPECT (REST AND PHARMACOLOGIC-STRESS) GATED LEFT VENTRICULAR WALL MOTION STUDY LEFT VENTRICULAR EJECTION FRACTION TECHNIQUE: Standard myocardial SPECT imaging was performed after resting intravenous injection of 10 mCi Tc-57m sestamibi. Subsequently, intravenous infusion of Lexiscan was performed under the supervision of the Cardiology staff. At peak effect of the drug, 30 mCi Tc-18m sestamibi was injected intravenously and standard myocardial SPECT imaging was performed. Quantitative gated imaging was also performed to evaluate left ventricular wall motion, and estimate left ventricular ejection fraction. COMPARISON:  None. FINDINGS: Perfusion: No decreased activity in the left ventricle on stress imaging to suggest reversible ischemia or infarction. Wall Motion: Normal left ventricular wall motion. No left ventricular dilation. Left Ventricular Ejection Fraction: 67 % End diastolic volume 97 ml End systolic volume 32 ml IMPRESSION: 1. No reversible ischemia or infarction. 2. Normal left ventricular wall motion. 3. Left ventricular ejection fraction 67% 4. Low-risk stress test findings*. *2012 Appropriate Use Criteria for Coronary Revascularization Focused Update: J Am Coll Cardiol. 5176;16(0):737-106. http://content.airportbarriers.com.aspx?articleid=[email or web address] Electronically Signed   By: Julian Hy M.D.   On: 05/12/2015 13:06   Dg Chest Port 1 View  05/15/2015  CLINICAL DATA:  Hypoxia today. EXAM: PORTABLE CHEST 1 VIEW COMPARISON:  05/11/2015 FINDINGS: Heart size and pulmonary vascularity are normal. Small focal areas of atelectasis at both lung bases and in the  medial aspect of the right upper lobe. No effusions. No lung consolidation. No acute osseous abnormality. Spinal cord stimulator in place in the mid thoracic spine. Chronic deformity of the proximal left humerus due to an old fracture. IMPRESSION: New slight bilateral atelectasis. Electronically Signed   By:  Lorriane Shire M.D.   On: 05/15/2015 10:32   Dg Chest Port 1 View  05/11/2015  CLINICAL DATA:  69 year old female with acute cough today. EXAM: PORTABLE CHEST 1 VIEW COMPARISON:  05/09/2015 and prior radiographs FINDINGS: Upper limits normal heart size again identified. Minimal subsegmental bibasilar atelectasis noted. There is no evidence of focal airspace disease, pulmonary edema, suspicious pulmonary nodule/mass, pleural effusion, or pneumothorax. No acute bony abnormalities are identified. A remote proximal left humeral fracture is again noted. Lower cervical spine surgical hardware and thoracic neurostimulator again identified IMPRESSION: Minimal subsegmental bibasilar atelectasis. Electronically Signed   By: Margarette Canada M.D.   On: 05/11/2015 12:59   US Abdomen Limited Ruq  05/09/2015  CLINICAL DATA:  Vomiting for 1 month.  Hypertension. EXAM: US ABDOMEN LIMITED - RIGHT UPPER QUADRANT COMPARISON:  11/15/2010 FINDINGS: Gallbladder: Echogenic mobile 1 cm gallstone in the gallbladder. No gallbladder wall thickening or sonographic Murphy sign. No pericholecystic fluid. Common bile duct: Diameter: 4 mm, within normal limits. Liver: Coarse echogenic liver with poor sonic penetration compatible with diffuse hepatic steatosis. IMPRESSION: 1. Cholelithiasis. Sonographic Murphy's sign absent; no gallbladder wall thickening. 2. Coarse echogenic liver with poor sonic penetration compatible with diffuse hepatic steatosis. Electronically Signed   By: Van Clines M.D.   On: 05/09/2015 10:44    Microbiology: No results found for this or any previous visit (from the past 240 hour(s)).   Labs: Basic  Metabolic Panel:  Recent Labs Lab 05/12/15 0400 05/13/15 0419 05/14/15 0253 05/15/15 0238 05/17/15 0242  NA 133* 140 140 139 135  K 4.0 3.3* 3.6 3.7 3.3*  CL 96* 99* 99* 99* 97*  CO2 28 32 33* 30 32  GLUCOSE 114* 124* 103* 122* 115*  BUN 9 <5* 5* <5* 9  CREATININE 0.80 0.66 0.63 0.73 0.84  CALCIUM 9.4 9.5 9.4 9.7 8.9   Liver Function Tests: No results for input(s): AST, ALT, ALKPHOS, BILITOT, PROT, ALBUMIN in the last 168 hours. No results for input(s): LIPASE, AMYLASE in the last 168 hours. No results for input(s): AMMONIA in the last 168 hours. CBC:  Recent Labs Lab 05/12/15 0400 05/14/15 0253 05/15/15 0238  WBC 10.3 7.4 11.8*  HGB 12.7 12.0 12.2  HCT 39.2 36.7 37.9  MCV 91.8 91.3 90.7  PLT 220 177 198   Cardiac Enzymes: No results for input(s): CKTOTAL, CKMB, CKMBINDEX, TROPONINI in the last 168 hours. BNP: BNP (last 3 results)  Recent Labs  04/25/15 1213  BNP 28.8    ProBNP (last 3 results) No results for input(s): PROBNP in the last 8760 hours.  CBG: No results for input(s): GLUCAP in the last 168 hours.   Left message for daughter Ms. Shela Leff to call back to discuss care.  Signed:  Vernell Leep, MD, FACP, FHM. [facility] Pager [phone]  If 7PM-7AM, please contact night-coverage [email or web address] Password [email or web address] 05/17/2015, 1:59 PM

## 2015-05-17 NOTE — Progress Notes (Signed)
Pt was discharged to home in the care of family. PIV x 2 and telemetry discontinued. I explained discharge instructions to pt and daughter. They both verbalized understanding.. Pt discharged with home oxygen PRN and she is to have home health with RN, PT, OT and home health aide. Pt stable at discharge.

## 2015-05-20 DIAGNOSIS — R0902 Hypoxemia: Secondary | ICD-10-CM | POA: Diagnosis not present

## 2015-05-20 DIAGNOSIS — I480 Paroxysmal atrial fibrillation: Secondary | ICD-10-CM | POA: Diagnosis not present

## 2015-05-20 DIAGNOSIS — I10 Essential (primary) hypertension: Secondary | ICD-10-CM | POA: Diagnosis not present

## 2015-05-20 DIAGNOSIS — K219 Gastro-esophageal reflux disease without esophagitis: Secondary | ICD-10-CM | POA: Diagnosis not present

## 2015-05-20 DIAGNOSIS — M1990 Unspecified osteoarthritis, unspecified site: Secondary | ICD-10-CM | POA: Diagnosis not present

## 2015-05-20 DIAGNOSIS — Z48815 Encounter for surgical aftercare following surgery on the digestive system: Secondary | ICD-10-CM | POA: Diagnosis not present

## 2015-05-21 DIAGNOSIS — M1990 Unspecified osteoarthritis, unspecified site: Secondary | ICD-10-CM | POA: Diagnosis not present

## 2015-05-21 DIAGNOSIS — K219 Gastro-esophageal reflux disease without esophagitis: Secondary | ICD-10-CM | POA: Diagnosis not present

## 2015-05-21 DIAGNOSIS — Z48815 Encounter for surgical aftercare following surgery on the digestive system: Secondary | ICD-10-CM | POA: Diagnosis not present

## 2015-05-21 DIAGNOSIS — I480 Paroxysmal atrial fibrillation: Secondary | ICD-10-CM | POA: Diagnosis not present

## 2015-05-21 DIAGNOSIS — I10 Essential (primary) hypertension: Secondary | ICD-10-CM | POA: Diagnosis not present

## 2015-05-21 DIAGNOSIS — R5383 Other fatigue: Secondary | ICD-10-CM | POA: Diagnosis not present

## 2015-05-21 DIAGNOSIS — R739 Hyperglycemia, unspecified: Secondary | ICD-10-CM | POA: Diagnosis not present

## 2015-05-21 DIAGNOSIS — R0902 Hypoxemia: Secondary | ICD-10-CM | POA: Diagnosis not present

## 2015-05-22 DIAGNOSIS — K819 Cholecystitis, unspecified: Secondary | ICD-10-CM | POA: Diagnosis not present

## 2015-05-22 DIAGNOSIS — I10 Essential (primary) hypertension: Secondary | ICD-10-CM | POA: Diagnosis not present

## 2015-05-22 DIAGNOSIS — I4891 Unspecified atrial fibrillation: Secondary | ICD-10-CM | POA: Diagnosis not present

## 2015-05-22 DIAGNOSIS — E039 Hypothyroidism, unspecified: Secondary | ICD-10-CM | POA: Diagnosis not present

## 2015-05-23 DIAGNOSIS — R0902 Hypoxemia: Secondary | ICD-10-CM | POA: Diagnosis not present

## 2015-05-23 DIAGNOSIS — I480 Paroxysmal atrial fibrillation: Secondary | ICD-10-CM | POA: Diagnosis not present

## 2015-05-23 DIAGNOSIS — Z48815 Encounter for surgical aftercare following surgery on the digestive system: Secondary | ICD-10-CM | POA: Diagnosis not present

## 2015-05-23 DIAGNOSIS — K219 Gastro-esophageal reflux disease without esophagitis: Secondary | ICD-10-CM | POA: Diagnosis not present

## 2015-05-23 DIAGNOSIS — M1990 Unspecified osteoarthritis, unspecified site: Secondary | ICD-10-CM | POA: Diagnosis not present

## 2015-05-23 DIAGNOSIS — I10 Essential (primary) hypertension: Secondary | ICD-10-CM | POA: Diagnosis not present

## 2015-05-25 DIAGNOSIS — K219 Gastro-esophageal reflux disease without esophagitis: Secondary | ICD-10-CM | POA: Diagnosis not present

## 2015-05-25 DIAGNOSIS — M1990 Unspecified osteoarthritis, unspecified site: Secondary | ICD-10-CM | POA: Diagnosis not present

## 2015-05-25 DIAGNOSIS — I480 Paroxysmal atrial fibrillation: Secondary | ICD-10-CM | POA: Diagnosis not present

## 2015-05-25 DIAGNOSIS — I10 Essential (primary) hypertension: Secondary | ICD-10-CM | POA: Diagnosis not present

## 2015-05-25 DIAGNOSIS — Z48815 Encounter for surgical aftercare following surgery on the digestive system: Secondary | ICD-10-CM | POA: Diagnosis not present

## 2015-05-25 DIAGNOSIS — R0902 Hypoxemia: Secondary | ICD-10-CM | POA: Diagnosis not present

## 2015-05-28 DIAGNOSIS — I10 Essential (primary) hypertension: Secondary | ICD-10-CM | POA: Diagnosis not present

## 2015-05-28 DIAGNOSIS — K219 Gastro-esophageal reflux disease without esophagitis: Secondary | ICD-10-CM | POA: Diagnosis not present

## 2015-05-28 DIAGNOSIS — I480 Paroxysmal atrial fibrillation: Secondary | ICD-10-CM | POA: Diagnosis not present

## 2015-05-28 DIAGNOSIS — M1990 Unspecified osteoarthritis, unspecified site: Secondary | ICD-10-CM | POA: Diagnosis not present

## 2015-05-28 DIAGNOSIS — R0902 Hypoxemia: Secondary | ICD-10-CM | POA: Diagnosis not present

## 2015-05-28 DIAGNOSIS — Z48815 Encounter for surgical aftercare following surgery on the digestive system: Secondary | ICD-10-CM | POA: Diagnosis not present

## 2015-05-29 DIAGNOSIS — M1990 Unspecified osteoarthritis, unspecified site: Secondary | ICD-10-CM | POA: Diagnosis not present

## 2015-05-29 DIAGNOSIS — R0902 Hypoxemia: Secondary | ICD-10-CM | POA: Diagnosis not present

## 2015-05-29 DIAGNOSIS — K219 Gastro-esophageal reflux disease without esophagitis: Secondary | ICD-10-CM | POA: Diagnosis not present

## 2015-05-29 DIAGNOSIS — I10 Essential (primary) hypertension: Secondary | ICD-10-CM | POA: Diagnosis not present

## 2015-05-29 DIAGNOSIS — I480 Paroxysmal atrial fibrillation: Secondary | ICD-10-CM | POA: Diagnosis not present

## 2015-05-29 DIAGNOSIS — Z48815 Encounter for surgical aftercare following surgery on the digestive system: Secondary | ICD-10-CM | POA: Diagnosis not present

## 2015-05-30 DIAGNOSIS — R5383 Other fatigue: Secondary | ICD-10-CM | POA: Diagnosis not present

## 2015-05-30 DIAGNOSIS — G4733 Obstructive sleep apnea (adult) (pediatric): Secondary | ICD-10-CM | POA: Diagnosis not present

## 2015-05-31 DIAGNOSIS — I10 Essential (primary) hypertension: Secondary | ICD-10-CM | POA: Diagnosis not present

## 2015-05-31 DIAGNOSIS — R0902 Hypoxemia: Secondary | ICD-10-CM | POA: Diagnosis not present

## 2015-05-31 DIAGNOSIS — I480 Paroxysmal atrial fibrillation: Secondary | ICD-10-CM | POA: Diagnosis not present

## 2015-05-31 DIAGNOSIS — Z48815 Encounter for surgical aftercare following surgery on the digestive system: Secondary | ICD-10-CM | POA: Diagnosis not present

## 2015-05-31 DIAGNOSIS — K219 Gastro-esophageal reflux disease without esophagitis: Secondary | ICD-10-CM | POA: Diagnosis not present

## 2015-05-31 DIAGNOSIS — M1990 Unspecified osteoarthritis, unspecified site: Secondary | ICD-10-CM | POA: Diagnosis not present

## 2015-06-01 DIAGNOSIS — M1990 Unspecified osteoarthritis, unspecified site: Secondary | ICD-10-CM | POA: Diagnosis not present

## 2015-06-01 DIAGNOSIS — Z48815 Encounter for surgical aftercare following surgery on the digestive system: Secondary | ICD-10-CM | POA: Diagnosis not present

## 2015-06-01 DIAGNOSIS — I10 Essential (primary) hypertension: Secondary | ICD-10-CM | POA: Diagnosis not present

## 2015-06-01 DIAGNOSIS — K219 Gastro-esophageal reflux disease without esophagitis: Secondary | ICD-10-CM | POA: Diagnosis not present

## 2015-06-01 DIAGNOSIS — I480 Paroxysmal atrial fibrillation: Secondary | ICD-10-CM | POA: Diagnosis not present

## 2015-06-01 DIAGNOSIS — R0902 Hypoxemia: Secondary | ICD-10-CM | POA: Diagnosis not present

## 2015-06-04 DIAGNOSIS — K219 Gastro-esophageal reflux disease without esophagitis: Secondary | ICD-10-CM | POA: Diagnosis not present

## 2015-06-04 DIAGNOSIS — R0902 Hypoxemia: Secondary | ICD-10-CM | POA: Diagnosis not present

## 2015-06-04 DIAGNOSIS — I480 Paroxysmal atrial fibrillation: Secondary | ICD-10-CM | POA: Diagnosis not present

## 2015-06-04 DIAGNOSIS — M1990 Unspecified osteoarthritis, unspecified site: Secondary | ICD-10-CM | POA: Diagnosis not present

## 2015-06-04 DIAGNOSIS — I10 Essential (primary) hypertension: Secondary | ICD-10-CM | POA: Diagnosis not present

## 2015-06-04 DIAGNOSIS — Z48815 Encounter for surgical aftercare following surgery on the digestive system: Secondary | ICD-10-CM | POA: Diagnosis not present

## 2015-06-05 ENCOUNTER — Ambulatory Visit (INDEPENDENT_AMBULATORY_CARE_PROVIDER_SITE_OTHER): Payer: Medicare Other | Admitting: Physician Assistant

## 2015-06-05 ENCOUNTER — Encounter: Payer: Self-pay | Admitting: Physician Assistant

## 2015-06-05 VITALS — BP 152/98 | HR 96 | Ht 66.0 in | Wt 259.5 lb

## 2015-06-05 DIAGNOSIS — I480 Paroxysmal atrial fibrillation: Secondary | ICD-10-CM

## 2015-06-05 DIAGNOSIS — Z7901 Long term (current) use of anticoagulants: Secondary | ICD-10-CM | POA: Insufficient documentation

## 2015-06-05 DIAGNOSIS — I10 Essential (primary) hypertension: Secondary | ICD-10-CM | POA: Diagnosis not present

## 2015-06-05 NOTE — Progress Notes (Signed)
Cardiology Office Note   Date:  06/05/2015   ID:  Shirley Leblanc, DOB 18-Sep-1945, MRN [id number]  PCP:  [name], MD  Cardiologist:  Dr Mardene Celeste, PA-C   Chief Complaint  Patient presents with  . Follow-up    post hosp//pt c/o SOB on exertion, on oxygen. Pt states no other Sx.    History of Present Illness: Shirley Leblanc is a 69 y.o. female with a history of morbid obesity, HLD, HTN, chronic dyspnea and chest pain; recent symptomatic cholelithiasis. Cath 2007 w/ LAD 40%, 2016 Stress test was negative for ischemia with an EF of 67% and no WMA. Had surgery, d/c 10/20. PAF w/ RVR, now on Eliquis CHADS2VASC is 15 ( female, age, HTN)   Shirley Leblanc presents for post hospital follow-up.  Since discharge from the hospital she has done well. She does not have scales and cannot weigh herself but states that she will try to get those and begin weighing herself every day. Her lower extremity edema is greatly improved. She is working with physical therapy and feels that she is getting stronger with them. Her granddaughter is present and questions why she continues to get short of breath with exertion, even though things seem to be going better. She has had no chest pain and no palpitations. If she is in atrial fibrillation, she is not aware.  She is tolerating the blood thinners well. She is compliant with her other medications including blood pressure medications. Her blood pressure today here is elevated but her blood pressure at home has been under good control.   Past Medical History  Diagnosis Date  . DJD (degenerative joint disease)   . Bursitis   . Hyperlipemia   . Depression   . GERD (gastroesophageal reflux disease)   . Obesities, morbid ([place])   . Urinary incontinence     Past Surgical History  Procedure Laterality Date  . Neck surgery    . Cardiac catheterization    . Total knee arthroplasty      x2  . Vaginal hysterectomy    . Cholecystectomy N/A  05/14/2015    Procedure: LAPAROSCOPIC CHOLECYSTECTOMY;  Surgeon: Coralie Keens, MD;  Location: [facility];  Service: General;  Laterality: N/A;    Current Outpatient Prescriptions  Medication Sig Dispense Refill  . apixaban (ELIQUIS) 5 MG TABS tablet Take 1 tablet (5 mg total) by mouth 2 (two) times daily. 60 tablet 0  . aspirin 81 MG EC tablet Take 81 mg by mouth daily.      Marland Kitchen atorvastatin (LIPITOR) 10 MG tablet Take 10 mg by mouth daily.    . Cholecalciferol (VITAMIN D3) 2000 UNITS TABS Take 2,000 Units by mouth daily.     . clonazePAM (KLONOPIN) 0.5 MG tablet Take 0.5 mg by mouth 2 (two) times daily as needed for anxiety.    . furosemide (LASIX) 20 MG tablet Take 1 tablet (20 mg total) by mouth daily. 30 tablet 0  . HYDROcodone-acetaminophen (NORCO) 10-325 MG per tablet Take 1-2 tablets by mouth every 4 (four) hours as needed for moderate pain.     Marland Kitchen levothyroxine (SYNTHROID, LEVOTHROID) 50 MCG tablet Take 50 mcg by mouth daily.      . quinapril (ACCUPRIL) 40 MG tablet Take 40 mg by mouth daily.     . sertraline (ZOLOFT) 100 MG tablet Take 200 mg by mouth daily.      No current facility-administered medications for this visit.    Allergies:  Contrast media; Penicillins; Sulfa antibiotics; and Morphine and related    Social History:  The patient  reports that she has never smoked. She does not have any smokeless tobacco history on file. She reports that she does not drink alcohol or use illicit drugs.   Family History:  The patient's family history includes Heart attack in her father and another family member; Lung disease in her mother and another family member.    ROS:  Please see the history of present illness. All other systems are reviewed and negative.    PHYSICAL EXAM: VS:  BP 152/98 mmHg  Pulse 96  Ht 5\' 6"  (1.676 m)  Wt 259 lb 8 oz (117.708 kg)  BMI 41.90 kg/m2 , BMI Body mass index is 41.9 kg/(m^2). GEN: Well nourished, well developed, female in no acute  distress HEENT: normal for age  Neck: no JVD, no carotid bruit, no masses Cardiac: RRR; no murmur, no rubs, or gallops Respiratory:  clear to auscultation bilaterally, normal work of breathing GI: soft, nontender, nondistended, + BS MS: no deformity or atrophy; no edema; distal pulses are 2+ in all 4 extremities  Skin: warm and dry, no rash Neuro:  Strength and sensation are intact Psych: euthymic mood, full affect  EKG:  EKG is not ordered today.  Recent Labs: 04/25/2015: B Natriuretic Peptide 28.8 05/10/2015: ALT 17 05/15/2015: Hemoglobin 12.2; Platelets 198 05/17/2015: BUN 9; Creatinine, Ser 0.84; Potassium 3.3*; Sodium 135    Wt Readings from Last 3 Encounters:  06/05/15 259 lb 8 oz (117.708 kg)  05/17/15 268 lb 11.2 oz (121.882 kg)  03/27/11 277 lb (125.646 kg)     Other studies Reviewed: Additional studies/ records that were reviewed today include: Hospital records, echo and stress test.  ASSESSMENT AND PLAN:  1.  PAF: When she was in PAF in the hospital, her rate was controlled. She is compliant with anticoagulation. She is asymptomatic and her heart is regular in rate and rhythm today so she is apparently maintaining sinus rhythm. According to her granddaughter, when she is working with physical therapy, her heart rate will get as high as 120, but then come right back down.  Continue rate control, she does not currently need any rate lowering medications.  2. Hypertension: Her blood pressure is elevated somewhat today, but the family states that his been running much better at home. She is compliant with her medications. When the blood pressure was taken today, she had just exerted herself significantly getting up onto the exam table. We will continue current therapy for now and they're to continue to track her blood pressure at home.  3. Chronic anticoagulation, Eliquis, CHADS2VASC=3: Continue Eliquis  Current medicines are reviewed at length with the patient today.  The  patient does not have concerns regarding medicines.  The following changes have been made:  no change  Labs/ tests ordered today include:  No orders of the defined types were placed in this encounter.     Disposition:   FU with Dr. Percival Spanish  Signed, Lenoard Aden  06/05/2015 2:27 PM    [facility] [place], [place], [place]  [address] Phone: [phone]; Fax: [phone]

## 2015-06-05 NOTE — Patient Instructions (Signed)
Rhonda Barrett, PA-C, recommends that you purchase some scales and weigh daily. Keep a record.  Your physician discussed the importance of regular exercise and recommended that you start or continue a regular exercise program for good health.  Suanne Marker recommends that you schedule a follow-up appointment in 3 months with Dr Percival Spanish.

## 2015-06-06 DIAGNOSIS — I10 Essential (primary) hypertension: Secondary | ICD-10-CM | POA: Diagnosis not present

## 2015-06-06 DIAGNOSIS — I480 Paroxysmal atrial fibrillation: Secondary | ICD-10-CM | POA: Diagnosis not present

## 2015-06-06 DIAGNOSIS — R0902 Hypoxemia: Secondary | ICD-10-CM | POA: Diagnosis not present

## 2015-06-06 DIAGNOSIS — M1990 Unspecified osteoarthritis, unspecified site: Secondary | ICD-10-CM | POA: Diagnosis not present

## 2015-06-06 DIAGNOSIS — K219 Gastro-esophageal reflux disease without esophagitis: Secondary | ICD-10-CM | POA: Diagnosis not present

## 2015-06-06 DIAGNOSIS — Z48815 Encounter for surgical aftercare following surgery on the digestive system: Secondary | ICD-10-CM | POA: Diagnosis not present

## 2015-06-07 DIAGNOSIS — Z48815 Encounter for surgical aftercare following surgery on the digestive system: Secondary | ICD-10-CM | POA: Diagnosis not present

## 2015-06-07 DIAGNOSIS — I480 Paroxysmal atrial fibrillation: Secondary | ICD-10-CM | POA: Diagnosis not present

## 2015-06-07 DIAGNOSIS — M1990 Unspecified osteoarthritis, unspecified site: Secondary | ICD-10-CM | POA: Diagnosis not present

## 2015-06-07 DIAGNOSIS — I10 Essential (primary) hypertension: Secondary | ICD-10-CM | POA: Diagnosis not present

## 2015-06-07 DIAGNOSIS — R0902 Hypoxemia: Secondary | ICD-10-CM | POA: Diagnosis not present

## 2015-06-07 DIAGNOSIS — K219 Gastro-esophageal reflux disease without esophagitis: Secondary | ICD-10-CM | POA: Diagnosis not present

## 2015-06-11 DIAGNOSIS — K219 Gastro-esophageal reflux disease without esophagitis: Secondary | ICD-10-CM | POA: Diagnosis not present

## 2015-06-11 DIAGNOSIS — I480 Paroxysmal atrial fibrillation: Secondary | ICD-10-CM | POA: Diagnosis not present

## 2015-06-11 DIAGNOSIS — M1990 Unspecified osteoarthritis, unspecified site: Secondary | ICD-10-CM | POA: Diagnosis not present

## 2015-06-11 DIAGNOSIS — Z48815 Encounter for surgical aftercare following surgery on the digestive system: Secondary | ICD-10-CM | POA: Diagnosis not present

## 2015-06-11 DIAGNOSIS — R0902 Hypoxemia: Secondary | ICD-10-CM | POA: Diagnosis not present

## 2015-06-11 DIAGNOSIS — I10 Essential (primary) hypertension: Secondary | ICD-10-CM | POA: Diagnosis not present

## 2015-06-14 DIAGNOSIS — R0902 Hypoxemia: Secondary | ICD-10-CM | POA: Diagnosis not present

## 2015-06-14 DIAGNOSIS — I10 Essential (primary) hypertension: Secondary | ICD-10-CM | POA: Diagnosis not present

## 2015-06-14 DIAGNOSIS — I480 Paroxysmal atrial fibrillation: Secondary | ICD-10-CM | POA: Diagnosis not present

## 2015-06-14 DIAGNOSIS — K219 Gastro-esophageal reflux disease without esophagitis: Secondary | ICD-10-CM | POA: Diagnosis not present

## 2015-06-14 DIAGNOSIS — Z48815 Encounter for surgical aftercare following surgery on the digestive system: Secondary | ICD-10-CM | POA: Diagnosis not present

## 2015-06-14 DIAGNOSIS — M1990 Unspecified osteoarthritis, unspecified site: Secondary | ICD-10-CM | POA: Diagnosis not present

## 2015-06-29 DIAGNOSIS — R5383 Other fatigue: Secondary | ICD-10-CM | POA: Diagnosis not present

## 2015-06-29 DIAGNOSIS — G4733 Obstructive sleep apnea (adult) (pediatric): Secondary | ICD-10-CM | POA: Diagnosis not present

## 2015-07-24 ENCOUNTER — Telehealth: Payer: Self-pay | Admitting: Cardiology

## 2015-07-24 NOTE — Telephone Encounter (Signed)
Pt says she is on Eliquis and it makes her feel bad.

## 2015-07-24 NOTE — Telephone Encounter (Signed)
Patient left message that she is feeling bad taking Eliquis Feels rough, no energy, feels sick One day she had bright red blood 3 weeks ago from her bottom Sleeping poorly Been on Eliquis since October Wants to know if she needs to be on it for the rest of her life

## 2015-08-11 ENCOUNTER — Emergency Department (HOSPITAL_COMMUNITY)
Admission: EM | Admit: 2015-08-11 | Discharge: 2015-08-11 | Disposition: A | Discharge status: Home / Self Care | Payer: Medicare Other | Attending: Emergency Medicine | Admitting: Emergency Medicine

## 2015-08-11 ENCOUNTER — Encounter (HOSPITAL_COMMUNITY): Payer: Self-pay

## 2015-08-11 ENCOUNTER — Emergency Department (HOSPITAL_COMMUNITY): Payer: Medicare Other

## 2015-08-11 DIAGNOSIS — Y9289 Other specified places as the place of occurrence of the external cause: Secondary | ICD-10-CM | POA: Insufficient documentation

## 2015-08-11 DIAGNOSIS — M1990 Unspecified osteoarthritis, unspecified site: Secondary | ICD-10-CM | POA: Diagnosis not present

## 2015-08-11 DIAGNOSIS — S8991XA Unspecified injury of right lower leg, initial encounter: Secondary | ICD-10-CM | POA: Insufficient documentation

## 2015-08-11 DIAGNOSIS — Z043 Encounter for examination and observation following other accident: Secondary | ICD-10-CM | POA: Diagnosis present

## 2015-08-11 DIAGNOSIS — M25561 Pain in right knee: Secondary | ICD-10-CM | POA: Diagnosis not present

## 2015-08-11 DIAGNOSIS — M25562 Pain in left knee: Secondary | ICD-10-CM

## 2015-08-11 DIAGNOSIS — Z7982 Long term (current) use of aspirin: Secondary | ICD-10-CM | POA: Diagnosis not present

## 2015-08-11 DIAGNOSIS — E785 Hyperlipidemia, unspecified: Secondary | ICD-10-CM | POA: Insufficient documentation

## 2015-08-11 DIAGNOSIS — Z7902 Long term (current) use of antithrombotics/antiplatelets: Secondary | ICD-10-CM | POA: Diagnosis not present

## 2015-08-11 DIAGNOSIS — Z8719 Personal history of other diseases of the digestive system: Secondary | ICD-10-CM | POA: Diagnosis not present

## 2015-08-11 DIAGNOSIS — Y998 Other external cause status: Secondary | ICD-10-CM | POA: Diagnosis not present

## 2015-08-11 DIAGNOSIS — Z9889 Other specified postprocedural states: Secondary | ICD-10-CM | POA: Insufficient documentation

## 2015-08-11 DIAGNOSIS — Z880 Allergy status to penicillin: Secondary | ICD-10-CM | POA: Diagnosis not present

## 2015-08-11 DIAGNOSIS — W06XXXA Fall from bed, initial encounter: Secondary | ICD-10-CM | POA: Diagnosis not present

## 2015-08-11 DIAGNOSIS — Y9389 Activity, other specified: Secondary | ICD-10-CM | POA: Diagnosis not present

## 2015-08-11 DIAGNOSIS — Z79899 Other long term (current) drug therapy: Secondary | ICD-10-CM | POA: Insufficient documentation

## 2015-08-11 DIAGNOSIS — S8992XA Unspecified injury of left lower leg, initial encounter: Secondary | ICD-10-CM | POA: Insufficient documentation

## 2015-08-11 DIAGNOSIS — S199XXA Unspecified injury of neck, initial encounter: Secondary | ICD-10-CM | POA: Diagnosis not present

## 2015-08-11 DIAGNOSIS — W19XXXA Unspecified fall, initial encounter: Secondary | ICD-10-CM

## 2015-08-11 DIAGNOSIS — S0990XA Unspecified injury of head, initial encounter: Secondary | ICD-10-CM | POA: Diagnosis not present

## 2015-08-11 DIAGNOSIS — F329 Major depressive disorder, single episode, unspecified: Secondary | ICD-10-CM | POA: Diagnosis not present

## 2015-08-11 NOTE — ED Notes (Signed)
Awake. Verbally responsive. A/O x4. Resp even and unlabored. No audible adventitious breath sounds noted. ABC's intact.  

## 2015-08-11 NOTE — ED Notes (Signed)
Pt reported falling out of bed while attempting to answer telephone. Pt c/o lt knee pain, lightheadedness/headaches.  Pt reported unable to recall hitting head or having LOC but denies visual disturbances and nausea. NIH scale 0.

## 2015-08-11 NOTE — ED Notes (Signed)
Ace wrap applied to lt knee. Pt (+)PMS, CRT brisk, no deformity/bruising/swelling noted.

## 2015-08-11 NOTE — Discharge Instructions (Signed)

## 2015-08-11 NOTE — ED Notes (Signed)
She states she fell this morning at home; not sure about l.o.c. She thinks "there's a little bit there I don't remember".  Currently she mainly c/o left knee pain and swelling.  She also c/o lesser pain in her right knee.  She is alert and oriented x 4 with clear speech and is in no distress.

## 2015-08-11 NOTE — ED Notes (Signed)
Patient transported to CT and returned to room without distress noted. 

## 2015-08-11 NOTE — ED Provider Notes (Signed)
CSN: [id number]     Arrival date & time 08/11/15  1437 History   First MD Initiated Contact with Patient 08/11/15 1551     Chief Complaint  Patient presents with  . Fall     (Consider location/radiation/quality/duration/timing/severity/associated sxs/prior Treatment) HPI Comments: Patient presents to the ED with a chief complaint of fall. She states that the phone rang while she was in bed and as she was getting out of bed she wasn't quite balanced and fell to the ground.  She states that she has a history of knee problems.  She denies hitting her head that she remembers, but she thinks she briefly passed out.  She takes eliquis for afib.  She denies headaches, slurred speech, numbness, weakness, or tingling.  There are no aggravating or alleviating factors.  She has been able to ambulate since the fall.    The history is provided by the patient. No language interpreter was used.    Past Medical History  Diagnosis Date  . DJD (degenerative joint disease)   . Bursitis   . Hyperlipemia   . Depression   . GERD (gastroesophageal reflux disease)   . Obesities, morbid ([place])   . Urinary incontinence    Past Surgical History  Procedure Laterality Date  . Neck surgery    . Cardiac catheterization    . Total knee arthroplasty      x2  . Vaginal hysterectomy    . Cholecystectomy N/A 05/14/2015    Procedure: LAPAROSCOPIC CHOLECYSTECTOMY;  Surgeon: Coralie Keens, MD;  Location: [facility] OR;  Service: General;  Laterality: N/A;   Family History  Problem Relation Age of Onset  . Lung disease    . Heart attack    . Lung disease Mother   . Heart attack Father    Social History  Substance Use Topics  . Smoking status: Never Smoker   . Smokeless tobacco: None  . Alcohol Use: No   OB History    No data available     Review of Systems  Constitutional: Negative for fever and chills.  Respiratory: Negative for shortness of breath.   Cardiovascular: Negative for chest pain.   Gastrointestinal: Negative for nausea, vomiting, diarrhea and constipation.  Genitourinary: Negative for dysuria.  Musculoskeletal: Positive for joint swelling and arthralgias.  All other systems reviewed and are negative.     Allergies  Contrast media; Penicillins; Sulfa antibiotics; and Morphine and related  Home Medications   Prior to Admission medications   Medication Sig Start Date End Date Taking? Authorizing Provider  apixaban (ELIQUIS) 5 MG TABS tablet Take 1 tablet (5 mg total) by mouth 2 (two) times daily. 05/17/15  Yes Modena Jansky, MD  aspirin 81 MG EC tablet Take 81 mg by mouth daily.     Yes Historical Provider, MD  atorvastatin (LIPITOR) 10 MG tablet Take 10 mg by mouth daily.   Yes Historical Provider, MD  Cholecalciferol (VITAMIN D3) 2000 UNITS TABS Take 2,000 Units by mouth daily.    Yes Historical Provider, MD  clonazePAM (KLONOPIN) 0.5 MG tablet Take 0.5 mg by mouth 2 (two) times daily as needed for anxiety.   Yes Historical Provider, MD  furosemide (LASIX) 20 MG tablet Take 1 tablet (20 mg total) by mouth daily. 05/17/15  Yes Modena Jansky, MD  HYDROcodone-acetaminophen (NORCO) 10-325 MG per tablet Take 1-2 tablets by mouth every 4 (four) hours as needed for moderate pain.    Yes Historical Provider, MD  levothyroxine (SYNTHROID, LEVOTHROID) 50 MCG  tablet Take 50 mcg by mouth daily.     Yes Historical Provider, MD  quinapril (ACCUPRIL) 40 MG tablet Take 40 mg by mouth daily.    Yes Historical Provider, MD  sertraline (ZOLOFT) 100 MG tablet Take 100 mg by mouth daily.    Yes Historical Provider, MD   BP 146/95 mmHg  Pulse 89  Temp(Src) 97.9 F (36.6 C) (Oral)  Resp 18  SpO2 96% Physical Exam  Constitutional: She is oriented to person, place, and time. She appears well-developed and well-nourished.  HENT:  Head: Normocephalic and atraumatic.  No sign of head injury  Eyes: Conjunctivae and EOM are normal. Pupils are equal, round, and reactive to light.   Neck: Normal range of motion. Neck supple.  Cardiovascular: Normal rate and regular rhythm.  Exam reveals no gallop and no friction rub.   No murmur heard. Pulmonary/Chest: Effort normal and breath sounds normal. No respiratory distress. She has no wheezes. She has no rales. She exhibits no tenderness.  Abdominal: Soft. Bowel sounds are normal. She exhibits no distension and no mass. There is no tenderness. There is no rebound and no guarding.  Musculoskeletal: Normal range of motion. She exhibits no edema or tenderness.  Moves all extremities ROM and strength of bilateral knees and ankles is 5/5  Neurological: She is alert and oriented to person, place, and time.  Sensation intact throughout  Skin: Skin is warm and dry.  Psychiatric: She has a normal mood and affect. Her behavior is normal. Judgment and thought content normal.  Nursing note and vitals reviewed.   ED Course  Procedures (including critical care time) Labs Review Labs Reviewed - No data to display  Imaging Review Dg Knee Complete 4 Views Left  08/11/2015  CLINICAL DATA:  Patient fell out of the bed today landing on her knees. History of knee arthroplasties. Anterior knee pain bilaterally. EXAM: LEFT KNEE - COMPLETE 4+ VIEW COMPARISON:  02/16/2013 FINDINGS: No fracture. Knee prosthetic components are well-seated and aligned with no evidence of loosening. Bones are demineralized. No joint effusion. Mild anterior soft tissue edema. IMPRESSION: No fracture or dislocation. No evidence of disruption of the orthopedic hardware. Electronically Signed   By: Lajean Manes M.D.   On: 08/11/2015 15:22   Dg Knee Complete 4 Views Right  08/11/2015  CLINICAL DATA:  Patient fell out of the bed today landing on her knees. Complaining of bilateral anterior knee pain. History bilateral knee replacements. EXAM: RIGHT KNEE - COMPLETE 4+ VIEW COMPARISON:  07/16/2009 FINDINGS: No acute fracture. Knee joint is normally aligned. Knee prosthetic  components appear well seated with no evidence of disruption/loosening. Minimal joint effusion is suggested. There are posterior vascular calcifications. IMPRESSION: 1. No fracture or dislocation. 2. No disruption of the orthopedic hardware. Electronically Signed   By: Lajean Manes M.D.   On: 08/11/2015 15:23   I have personally reviewed and evaluated these images and lab results as part of my medical decision-making.   EKG Interpretation None      MDM   Final diagnoses:  Fall, initial encounter  Bilateral knee pain    Patient with mechanical fall while trying to quickly get out of bed this morning to get the phone.  Landed on knees and possibly passed out.  Anticoagulated with eliquis.  Uncertain if she hit her head.  No headache.  Neurovascularly intact.  CT head and c-spine given anticoagulants, possible LOC, and prior neck surgery with metal hardware still present.  Bilateral knee films in triage  are negative.  Will give ACE wraps and advise that she rely more heavily on walker.    CTs are negative for acute injury.  DC to home.  Discussed with Dr. Jeneen Rinks.    Montine Circle, PA-C 08/11/15 [place], MD 08/16/15 [phone]

## 2015-08-12 NOTE — Telephone Encounter (Signed)
I will talk to her further about Eliquis when I see her in Feb.  However, I would like to have a CBC.  She did not have one when she was in the ED the other day.

## 2015-08-13 NOTE — Telephone Encounter (Signed)
Left message for patient to call.

## 2015-08-14 NOTE — Telephone Encounter (Signed)
Left message on voice mail  to call back

## 2015-08-15 NOTE — Telephone Encounter (Signed)
Left voice message to call office back.

## 2015-08-16 DIAGNOSIS — M545 Low back pain: Secondary | ICD-10-CM | POA: Diagnosis not present

## 2015-08-16 DIAGNOSIS — E039 Hypothyroidism, unspecified: Secondary | ICD-10-CM | POA: Diagnosis not present

## 2015-08-16 DIAGNOSIS — K625 Hemorrhage of anus and rectum: Secondary | ICD-10-CM | POA: Diagnosis not present

## 2015-08-22 DIAGNOSIS — I10 Essential (primary) hypertension: Secondary | ICD-10-CM | POA: Diagnosis not present

## 2015-08-22 DIAGNOSIS — R739 Hyperglycemia, unspecified: Secondary | ICD-10-CM | POA: Diagnosis not present

## 2015-08-22 DIAGNOSIS — E039 Hypothyroidism, unspecified: Secondary | ICD-10-CM | POA: Diagnosis not present

## 2015-08-30 DIAGNOSIS — K921 Melena: Secondary | ICD-10-CM | POA: Diagnosis not present

## 2015-08-30 DIAGNOSIS — R739 Hyperglycemia, unspecified: Secondary | ICD-10-CM | POA: Diagnosis not present

## 2015-08-30 DIAGNOSIS — I10 Essential (primary) hypertension: Secondary | ICD-10-CM | POA: Diagnosis not present

## 2015-08-30 DIAGNOSIS — E039 Hypothyroidism, unspecified: Secondary | ICD-10-CM | POA: Diagnosis not present

## 2015-09-11 NOTE — Progress Notes (Signed)
No show

## 2015-09-12 ENCOUNTER — Encounter (HOSPITAL_COMMUNITY): Payer: Self-pay | Admitting: Emergency Medicine

## 2015-09-12 ENCOUNTER — Emergency Department (HOSPITAL_COMMUNITY): Payer: Medicare Other

## 2015-09-12 ENCOUNTER — Emergency Department (HOSPITAL_COMMUNITY)
Admission: EM | Admit: 2015-09-12 | Discharge: 2015-09-13 | Disposition: A | Discharge status: Home / Self Care | Payer: Medicare Other | Attending: Emergency Medicine | Admitting: Emergency Medicine

## 2015-09-12 ENCOUNTER — Encounter: Payer: Medicare Other | Admitting: Cardiology

## 2015-09-12 DIAGNOSIS — R404 Transient alteration of awareness: Secondary | ICD-10-CM | POA: Diagnosis not present

## 2015-09-12 DIAGNOSIS — Z9889 Other specified postprocedural states: Secondary | ICD-10-CM | POA: Insufficient documentation

## 2015-09-12 DIAGNOSIS — R42 Dizziness and giddiness: Secondary | ICD-10-CM | POA: Diagnosis not present

## 2015-09-12 DIAGNOSIS — Z7982 Long term (current) use of aspirin: Secondary | ICD-10-CM | POA: Diagnosis not present

## 2015-09-12 DIAGNOSIS — Z79899 Other long term (current) drug therapy: Secondary | ICD-10-CM | POA: Insufficient documentation

## 2015-09-12 DIAGNOSIS — H9201 Otalgia, right ear: Secondary | ICD-10-CM | POA: Insufficient documentation

## 2015-09-12 DIAGNOSIS — R51 Headache: Secondary | ICD-10-CM | POA: Diagnosis not present

## 2015-09-12 DIAGNOSIS — Z8739 Personal history of other diseases of the musculoskeletal system and connective tissue: Secondary | ICD-10-CM | POA: Diagnosis not present

## 2015-09-12 DIAGNOSIS — E785 Hyperlipidemia, unspecified: Secondary | ICD-10-CM | POA: Diagnosis not present

## 2015-09-12 DIAGNOSIS — Z8719 Personal history of other diseases of the digestive system: Secondary | ICD-10-CM | POA: Diagnosis not present

## 2015-09-12 DIAGNOSIS — Z880 Allergy status to penicillin: Secondary | ICD-10-CM | POA: Diagnosis not present

## 2015-09-12 DIAGNOSIS — F329 Major depressive disorder, single episode, unspecified: Secondary | ICD-10-CM | POA: Diagnosis not present

## 2015-09-12 DIAGNOSIS — R112 Nausea with vomiting, unspecified: Secondary | ICD-10-CM | POA: Insufficient documentation

## 2015-09-12 LAB — URINALYSIS, ROUTINE W REFLEX MICROSCOPIC
BILIRUBIN URINE: NEGATIVE
Glucose, UA: NEGATIVE mg/dL
HGB URINE DIPSTICK: NEGATIVE
Ketones, ur: NEGATIVE mg/dL
NITRITE: NEGATIVE
PROTEIN: NEGATIVE mg/dL
SPECIFIC GRAVITY, URINE: 1.023 (ref 1.005–1.030)
pH: 7.5 (ref 5.0–8.0)

## 2015-09-12 LAB — URINE MICROSCOPIC-ADD ON: RBC / HPF: NONE SEEN RBC/hpf (ref 0–5)

## 2015-09-12 LAB — CBC WITH DIFFERENTIAL/PLATELET
BASOS PCT: 0 %
Basophils Absolute: 0 10*3/uL (ref 0.0–0.1)
EOS ABS: 0.1 10*3/uL (ref 0.0–0.7)
EOS PCT: 1 %
HCT: 42.6 % (ref 36.0–46.0)
HEMOGLOBIN: 14.4 g/dL (ref 12.0–15.0)
Lymphocytes Relative: 13 %
Lymphs Abs: 1.6 10*3/uL (ref 0.7–4.0)
MCH: 29.5 pg (ref 26.0–34.0)
MCHC: 33.8 g/dL (ref 30.0–36.0)
MCV: 87.3 fL (ref 78.0–100.0)
MONOS PCT: 3 %
Monocytes Absolute: 0.4 10*3/uL (ref 0.1–1.0)
NEUTROS PCT: 83 %
Neutro Abs: 10.5 10*3/uL — ABNORMAL HIGH (ref 1.7–7.7)
PLATELETS: 222 10*3/uL (ref 150–400)
RBC: 4.88 MIL/uL (ref 3.87–5.11)
RDW: 13 % (ref 11.5–15.5)
WBC: 12.6 10*3/uL — AB (ref 4.0–10.5)

## 2015-09-12 LAB — COMPREHENSIVE METABOLIC PANEL
ALK PHOS: 92 U/L (ref 38–126)
ALT: 17 U/L (ref 14–54)
AST: 21 U/L (ref 15–41)
Albumin: 3.6 g/dL (ref 3.5–5.0)
Anion gap: 14 (ref 5–15)
BILIRUBIN TOTAL: 0.9 mg/dL (ref 0.3–1.2)
BUN: 13 mg/dL (ref 6–20)
CALCIUM: 10 mg/dL (ref 8.9–10.3)
CO2: 27 mmol/L (ref 22–32)
CREATININE: 0.74 mg/dL (ref 0.44–1.00)
Chloride: 99 mmol/L — ABNORMAL LOW (ref 101–111)
Glucose, Bld: 143 mg/dL — ABNORMAL HIGH (ref 65–99)
Potassium: 3.1 mmol/L — ABNORMAL LOW (ref 3.5–5.1)
Sodium: 140 mmol/L (ref 135–145)
Total Protein: 6.9 g/dL (ref 6.5–8.1)

## 2015-09-12 MED ORDER — MECLIZINE HCL 25 MG PO TABS
25.0000 mg | ORAL_TABLET | Freq: Once | ORAL | Status: AC
  Administered 2015-09-12: 25 mg via ORAL
  Filled 2015-09-12: qty 1

## 2015-09-12 MED ORDER — ONDANSETRON HCL 4 MG/2ML IJ SOLN
4.0000 mg | Freq: Once | INTRAMUSCULAR | Status: AC
  Administered 2015-09-12: 4 mg via INTRAVENOUS
  Filled 2015-09-12: qty 2

## 2015-09-12 MED ORDER — SODIUM CHLORIDE 0.9 % IV BOLUS (SEPSIS)
500.0000 mL | Freq: Once | INTRAVENOUS | Status: AC
  Administered 2015-09-12: 500 mL via INTRAVENOUS

## 2015-09-12 NOTE — ED Notes (Signed)
Provided PT with water as per PA.

## 2015-09-12 NOTE — ED Provider Notes (Signed)
CSN: [id number]     Arrival date & time 09/12/15  1930 History   First MD Initiated Contact with Patient 09/12/15 2007     Chief Complaint  Patient presents with  . Dizziness     (Consider location/radiation/quality/duration/timing/severity/associated sxs/prior Treatment) HPI Comments: Patient with a history of atrial fibrillation, GI bleed preventing use of anticoagulants, thyroid disease, GERD presents with sudden onset room-spinning dizziness with nausea and vomiting. Symptoms started last night around 10:00 pm and have been persistent since onset. She reports she can get up and move around and feels more dizzy when she lies or sits back down. She has had TNTC vomiting episodes today that were NBNB. She also developed right ear pain today without drainage or bleeding from the ear. No fever or recent illness. She reports a remote history of vertigo once 20 years ago. No chest pain, SOB, cough, diarrhea, urinary symptoms.  Patient is a 70 y.o. female presenting with dizziness. The history is provided by the patient. No language interpreter was used.  Dizziness Quality:  Room spinning Severity:  Severe Onset quality:  Sudden Duration:  24 hours Timing:  Constant Chronicity: She reports vertigo once 20 year ago. Associated symptoms: headaches (She reports mild generalized headache.), nausea and vomiting   Associated symptoms: no shortness of breath     Past Medical History  Diagnosis Date  . DJD (degenerative joint disease)   . Bursitis   . Hyperlipemia   . Depression   . GERD (gastroesophageal reflux disease)   . Obesities, morbid ([place])   . Urinary incontinence    Past Surgical History  Procedure Laterality Date  . Neck surgery    . Cardiac catheterization    . Total knee arthroplasty      x2  . Vaginal hysterectomy    . Cholecystectomy N/A 05/14/2015    Procedure: LAPAROSCOPIC CHOLECYSTECTOMY;  Surgeon: Coralie Keens, MD;  Location: [facility] OR;  Service: General;  Laterality:  N/A;   Family History  Problem Relation Age of Onset  . Lung disease    . Heart attack    . Lung disease Mother   . Heart attack Father    Social History  Substance Use Topics  . Smoking status: Never Smoker   . Smokeless tobacco: None  . Alcohol Use: No   OB History    No data available     Review of Systems  Constitutional: Negative for fever and chills.  HENT: Positive for ear pain. Negative for congestion and sore throat.   Eyes: Negative for visual disturbance.  Respiratory: Negative.  Negative for cough and shortness of breath.   Cardiovascular: Negative.   Gastrointestinal: Positive for nausea and vomiting. Negative for abdominal pain.  Genitourinary: Negative.  Negative for dysuria and frequency.  Musculoskeletal: Negative.  Negative for myalgias and neck stiffness.  Skin: Negative.  Negative for rash.  Neurological: Positive for dizziness and headaches (She reports mild generalized headache.).  Psychiatric/Behavioral: Negative for confusion.      Allergies  Contrast media; Penicillins; Sulfa antibiotics; and Morphine and related  Home Medications   Prior to Admission medications   Medication Sig Start Date End Date Taking? Authorizing Provider  aspirin 81 MG EC tablet Take 81 mg by mouth daily.     Yes Historical Provider, MD  atorvastatin (LIPITOR) 10 MG tablet Take 10 mg by mouth daily.   Yes Historical Provider, MD  Cholecalciferol (VITAMIN D3) 2000 UNITS TABS Take 2,000 Units by mouth daily.    Yes Historical Provider,  MD  clonazePAM (KLONOPIN) 0.5 MG tablet Take 0.5 mg by mouth 2 (two) times daily as needed for anxiety.   Yes Historical Provider, MD  furosemide (LASIX) 20 MG tablet Take 1 tablet (20 mg total) by mouth daily. 05/17/15  Yes Modena Jansky, MD  HYDROcodone-acetaminophen (NORCO) 10-325 MG per tablet Take 1-2 tablets by mouth every 4 (four) hours as needed for moderate pain.    Yes Historical Provider, MD  levothyroxine (SYNTHROID,  LEVOTHROID) 50 MCG tablet Take 50 mcg by mouth daily.     Yes Historical Provider, MD  quinapril (ACCUPRIL) 40 MG tablet Take 40 mg by mouth daily.    Yes Historical Provider, MD  sertraline (ZOLOFT) 100 MG tablet Take 100 mg by mouth daily.    Yes Historical Provider, MD  apixaban (ELIQUIS) 5 MG TABS tablet Take 1 tablet (5 mg total) by mouth 2 (two) times daily. Patient not taking: Reported on 09/12/2015 05/17/15   Modena Jansky, MD   BP 129/77 mmHg  Pulse 80  Temp(Src) 97.5 F (36.4 C) (Oral)  Resp 18  Ht 5\' 7"  (1.702 m)  Wt 118.389 kg  BMI 40.87 kg/m2  SpO2 97% Physical Exam  Constitutional: She is oriented to person, place, and time. She appears well-developed and well-nourished.  HENT:  Head: Normocephalic.  Neck: Normal range of motion. Neck supple.  Cardiovascular: Normal rate and regular rhythm.   Pulmonary/Chest: Effort normal and breath sounds normal.  Abdominal: Soft. Bowel sounds are normal. There is no tenderness. There is no rebound and no guarding.  Musculoskeletal: Normal range of motion.  Neurological: She is alert and oriented to person, place, and time.  Skin: Skin is warm and dry. No rash noted.  Psychiatric: She has a normal mood and affect.    ED Course  Procedures (including critical care time) Labs Review Labs Reviewed  COMPREHENSIVE METABOLIC PANEL  CBC WITH DIFFERENTIAL/PLATELET  URINALYSIS, ROUTINE W REFLEX MICROSCOPIC (NOT AT [facility])   Results for orders placed or performed during the hospital encounter of 09/12/15  Comprehensive metabolic panel  Result Value Ref Range   Sodium 140 135 - 145 mmol/L   Potassium 3.1 (L) 3.5 - 5.1 mmol/L   Chloride 99 (L) 101 - 111 mmol/L   CO2 27 22 - 32 mmol/L   Glucose, Bld 143 (H) 65 - 99 mg/dL   BUN 13 6 - 20 mg/dL   Creatinine, Ser 0.74 0.44 - 1.00 mg/dL   Calcium 10.0 8.9 - 10.3 mg/dL   Total Protein 6.9 6.5 - 8.1 g/dL   Albumin 3.6 3.5 - 5.0 g/dL   AST 21 15 - 41 U/L   ALT 17 14 - 54 U/L   Alkaline  Phosphatase 92 38 - 126 U/L   Total Bilirubin 0.9 0.3 - 1.2 mg/dL   GFR calc non Af Amer >60 >60 mL/min   GFR calc Af Amer >60 >60 mL/min   Anion gap 14 5 - 15  CBC with Differential  Result Value Ref Range   WBC 12.6 (H) 4.0 - 10.5 K/uL   RBC 4.88 3.87 - 5.11 MIL/uL   Hemoglobin 14.4 12.0 - 15.0 g/dL   HCT 42.6 36.0 - 46.0 %   MCV 87.3 78.0 - 100.0 fL   MCH 29.5 26.0 - 34.0 pg   MCHC 33.8 30.0 - 36.0 g/dL   RDW 13.0 11.5 - 15.5 %   Platelets 222 150 - 400 K/uL   Neutrophils Relative % 83 %   Neutro Abs  10.5 (H) 1.7 - 7.7 K/uL   Lymphocytes Relative 13 %   Lymphs Abs 1.6 0.7 - 4.0 K/uL   Monocytes Relative 3 %   Monocytes Absolute 0.4 0.1 - 1.0 K/uL   Eosinophils Relative 1 %   Eosinophils Absolute 0.1 0.0 - 0.7 K/uL   Basophils Relative 0 %   Basophils Absolute 0.0 0.0 - 0.1 K/uL  Urinalysis, Routine w reflex microscopic  Result Value Ref Range   Color, Urine YELLOW YELLOW   APPearance CLOUDY (A) CLEAR   Specific Gravity, Urine 1.023 1.005 - 1.030   pH 7.5 5.0 - 8.0   Glucose, UA NEGATIVE NEGATIVE mg/dL   Hgb urine dipstick NEGATIVE NEGATIVE   Bilirubin Urine NEGATIVE NEGATIVE   Ketones, ur NEGATIVE NEGATIVE mg/dL   Protein, ur NEGATIVE NEGATIVE mg/dL   Nitrite NEGATIVE NEGATIVE   Leukocytes, UA SMALL (A) NEGATIVE  Urine microscopic-add on  Result Value Ref Range   Squamous Epithelial / LPF 0-5 (A) NONE SEEN   WBC, UA 0-5 0 - 5 WBC/hpf   RBC / HPF NONE SEEN 0 - 5 RBC/hpf   Bacteria, UA RARE (A) NONE SEEN   Casts HYALINE CASTS (A) NEGATIVE   Ct Head Wo Contrast  09/13/2015  CLINICAL DATA:  Dizziness since last night.  Sudden onset. EXAM: CT HEAD WITHOUT CONTRAST TECHNIQUE: Contiguous axial images were obtained from the base of the skull through the vertex without intravenous contrast. COMPARISON:  08/11/2015 FINDINGS: Mild cerebral atrophy. No ventricular dilatation. Patchy low-attenuation changes in the white matter suggesting mild small vessel ischemic change. No  mass effect or midline shift. No abnormal extra-axial fluid collections. Gray-white matter junctions are distinct. Basal cisterns are not effaced. No evidence of acute intracranial hemorrhage. No depressed skull fractures. Visualized paranasal sinuses and mastoid air cells are not opacified. IMPRESSION: No acute intracranial abnormalities. Mild chronic atrophy and small vessel ischemic changes. Electronically Signed   By: Lucienne Capers M.D.   On: 09/13/2015 00:09    Imaging Review No results found. I have personally reviewed and evaluated these images and lab results as part of my medical decision-making.   EKG Interpretation None      MDM   Final diagnoses:  None    1. Vertigo  The patient presents with room-spinning dizziness onset yesterday. No pain, headache, visual changes, lateralizing weakness, speech difficulty or facial asymmetry. She reports history of vertigo in the distant past.   She is given meclizine and Zofran and reports symptoms have resolved. She has been up and ambulatory without imbalance or ataxia. Dizziness was not recurrent when ambulatory. Head CT negative for acute condition. The patient desires to go home. Discussed dizziness as vertigo but that stroke was not likely but not completely ruled out. Discussed return precautions and close follow up for recheck with PCP this week. The patient is comfortable with discharge home. She has been evaluated by Dr. Lacinda Axon.    Charlann Lange, PA-C 09/13/15 0034  Nat Christen, MD 09/14/15 [phone]

## 2015-09-13 ENCOUNTER — Encounter: Payer: Self-pay | Admitting: Cardiology

## 2015-09-13 DIAGNOSIS — R42 Dizziness and giddiness: Secondary | ICD-10-CM | POA: Diagnosis not present

## 2015-09-13 MED ORDER — ONDANSETRON 8 MG PO TBDP: 8.0000 mg | ORAL_TABLET | Freq: Three times a day (TID) | ORAL | Status: DC | PRN

## 2015-09-13 MED ORDER — ONDANSETRON HCL 4 MG/2ML IJ SOLN: 4.0000 mg | Freq: Once | INTRAMUSCULAR | Status: DC

## 2015-09-13 MED ORDER — MECLIZINE HCL 25 MG PO TABS: 25.0000 mg | ORAL_TABLET | Freq: Three times a day (TID) | ORAL | Status: DC

## 2015-09-13 NOTE — ED Notes (Signed)
Pt verbalized understanding of d/c instructions and prescription. Pt stable and NAD. Pt states "My dizziness is much better"

## 2015-09-13 NOTE — Discharge Instructions (Signed)
Benign Positional Vertigo Vertigo is the feeling that you or your surroundings are moving when they are not. Benign positional vertigo is the most common form of vertigo. The cause of this condition is not serious (is benign). This condition is triggered by certain movements and positions (is positional). This condition can be dangerous if it occurs while you are doing something that could endanger you or others, such as driving.  CAUSES In many cases, the cause of this condition is not known. It may be caused by a disturbance in an area of the inner ear that helps your brain to sense movement and balance. This disturbance can be caused by a viral infection (labyrinthitis), head injury, or repetitive motion. RISK FACTORS This condition is more likely to develop in:  Women.  People who are 50 years of age or older. SYMPTOMS Symptoms of this condition usually happen when you move your head or your eyes in different directions. Symptoms may start suddenly, and they usually last for less than a minute. Symptoms may include:  Loss of balance and falling.  Feeling like you are spinning or moving.  Feeling like your surroundings are spinning or moving.  Nausea and vomiting.  Blurred vision.  Dizziness.  Involuntary eye movement (nystagmus). Symptoms can be mild and cause only slight annoyance, or they can be severe and interfere with daily life. Episodes of benign positional vertigo may return (recur) over time, and they may be triggered by certain movements. Symptoms may improve over time. DIAGNOSIS This condition is usually diagnosed by medical history and a physical exam of the head, neck, and ears. You may be referred to a health care provider who specializes in ear, nose, and throat (ENT) problems (otolaryngologist) or a provider who specializes in disorders of the nervous system (neurologist). You may have additional testing, including:  MRI.  A CT scan.  Eye movement tests. Your  health care provider may ask you to change positions quickly while he or she watches you for symptoms of benign positional vertigo, such as nystagmus. Eye movement may be tested with an electronystagmogram (ENG), caloric stimulation, the Dix-Hallpike test, or the roll test.  An electroencephalogram (EEG). This records electrical activity in your brain.  Hearing tests. TREATMENT Usually, your health care provider will treat this by moving your head in specific positions to adjust your inner ear back to normal. Surgery may be needed in severe cases, but this is rare. In some cases, benign positional vertigo may resolve on its own in 2-4 weeks. HOME CARE INSTRUCTIONS Safety  Move slowly.Avoid sudden body or head movements.  Avoid driving.  Avoid operating heavy machinery.  Avoid doing any tasks that would be dangerous to you or others if a vertigo episode would occur.  If you have trouble walking or keeping your balance, try using a cane for stability. If you feel dizzy or unstable, sit down right away.  Return to your normal activities as told by your health care provider. Ask your health care provider what activities are safe for you. General Instructions  Take over-the-counter and prescription medicines only as told by your health care provider.  Avoid certain positions or movements as told by your health care provider.  Drink enough fluid to keep your urine clear or pale yellow.  Keep all follow-up visits as told by your health care provider. This is important. SEEK MEDICAL CARE IF:  You have a fever.  Your condition gets worse or you develop new symptoms.  Your family or friends   notice any behavioral changes.  Your nausea or vomiting gets worse.  You have numbness or a "pins and needles" sensation. SEEK IMMEDIATE MEDICAL CARE IF:  You have difficulty speaking or moving.  You are always dizzy.  You faint.  You develop severe headaches.  You have weakness in your  legs or arms.  You have changes in your hearing or vision.  You develop a stiff neck.  You develop sensitivity to light.   This information is not intended to replace advice given to you by your health care provider. Make sure you discuss any questions you have with your health care provider.   Document Released: 04/21/2006 Document Revised: 04/04/2015 Document Reviewed: 11/06/2014 Elsevier Interactive Patient Education 2016 [employer]  

## 2015-09-17 DIAGNOSIS — Z09 Encounter for follow-up examination after completed treatment for conditions other than malignant neoplasm: Secondary | ICD-10-CM | POA: Diagnosis not present

## 2015-09-17 DIAGNOSIS — R42 Dizziness and giddiness: Secondary | ICD-10-CM | POA: Diagnosis not present

## 2015-10-08 ENCOUNTER — Ambulatory Visit: Payer: Medicare Other | Admitting: Cardiology

## 2015-10-16 ENCOUNTER — Ambulatory Visit: Payer: Medicare Other | Admitting: Cardiology

## 2015-10-21 NOTE — Progress Notes (Signed)
Cardiology Office Note   Date:  10/23/2015   ID:  Shirley Leblanc, DOB 05/30/1946, MRN [id number]  PCP:  [name], MD  Cardiologist:   Minus Breeding, MD   Chief Complaint  Patient presents with  . Follow-up    Patient has no complaints.      History of Present Illness: Shirley Leblanc is a 70 y.o. female who presents for follow-up after having a cholecystectomy. With that hospitalization last fall she had paroxysmal atrial fibrillation.  She was initially on L occlusal. However, she had some bleeding and her bowel movements and she said this medication made her feel bad so she stopped taking it. She did not want a colonoscopy although she did talk with her GI doctor. She said that since being hospitalized she's had no further symptoms that might suggest recurrent atrial fibrillation. She doesn't have palpitations, presyncope or syncope. She doesn't have chest pressure, neck or arm discomfort. He doesn't have weight gain and in fact has lost 20 pounds.   Past Medical History  Diagnosis Date  . DJD (degenerative joint disease)   . Bursitis   . Hyperlipemia   . Depression   . GERD (gastroesophageal reflux disease)   . Obesities, morbid ([place])   . Urinary incontinence     Past Surgical History  Procedure Laterality Date  . Neck surgery    . Cardiac catheterization    . Total knee arthroplasty      x2  . Vaginal hysterectomy    . Cholecystectomy N/A 05/14/2015    Procedure: LAPAROSCOPIC CHOLECYSTECTOMY;  Surgeon: Coralie Keens, MD;  Location: [facility];  Service: General;  Laterality: N/A;     Current Outpatient Prescriptions  Medication Sig Dispense Refill  . aspirin 81 MG EC tablet Take 81 mg by mouth daily.      Marland Kitchen atorvastatin (LIPITOR) 10 MG tablet Take 10 mg by mouth daily.    . Cholecalciferol (VITAMIN D3) 2000 UNITS TABS Take 2,000 Units by mouth daily.     . clonazePAM (KLONOPIN) 0.5 MG tablet Take 0.5 mg by mouth 2 (two) times daily as needed for anxiety.    .  furosemide (LASIX) 20 MG tablet Take 1 tablet (20 mg total) by mouth daily. 30 tablet 0  . HYDROcodone-acetaminophen (NORCO) 10-325 MG per tablet Take 1-2 tablets by mouth every 4 (four) hours as needed for moderate pain.     Marland Kitchen levothyroxine (SYNTHROID, LEVOTHROID) 50 MCG tablet Take 50 mcg by mouth daily.      . meclizine (ANTIVERT) 25 MG tablet Take 1 tablet (25 mg total) by mouth 3 (three) times daily. 30 tablet 0  . ondansetron (ZOFRAN ODT) 8 MG disintegrating tablet Take 1 tablet (8 mg total) by mouth every 8 (eight) hours as needed for nausea or vomiting. 20 tablet 0  . ondansetron (ZOFRAN) 4 MG/2ML SOLN injection Inject 2 mLs (4 mg total) into the vein once. 2 mL 0  . quinapril (ACCUPRIL) 40 MG tablet Take 40 mg by mouth daily.     . sertraline (ZOLOFT) 100 MG tablet Take 100 mg by mouth daily.      No current facility-administered medications for this visit.    Allergies:   Contrast media; Penicillins; Sulfa antibiotics; and Morphine and related    ROS:  Please see the history of present illness.   Otherwise, review of systems are positive for back pain.   All other systems are reviewed and negative.    PHYSICAL EXAM: VS:  BP 148/90 mmHg  Pulse 96  Ht 5\' 6"  (1.676 m)  Wt 243 lb (110.224 kg)  BMI 39.24 kg/m2 , BMI Body mass index is 39.24 kg/(m^2). GENERAL:  Well appearing HEENT:  Pupils equal round and reactive, fundi not visualized, oral mucosa unremarkable NECK:  No jugular venous distention, waveform within normal limits, carotid upstroke brisk and symmetric, no bruits, no thyromegaly LYMPHATICS:  No cervical, inguinal adenopathy LUNGS:  Clear to auscultation bilaterally BACK:  No CVA tenderness CHEST:  Unremarkable HEART:  PMI not displaced or sustained,S1 and S2 within normal limits, no S3, no S4, no clicks, no rubs, no murmurs ABD:  Flat, positive bowel sounds normal in frequency in pitch, no bruits, no rebound, no guarding, no midline pulsatile mass, no hepatomegaly, no  splenomegaly EXT:  2 plus pulses throughout, no edema, no cyanosis no clubbing SKIN:  No rashes no nodules NEURO:  Cranial nerves II through XII grossly intact, motor grossly intact throughout PSYCH:  Cognitively intact, oriented to person place and time    EKG:  EKG is not ordered today.   Recent Labs: 04/25/2015: B Natriuretic Peptide 28.8 09/12/2015: ALT 17; BUN 13; Creatinine, Ser 0.74; Hemoglobin 14.4; Platelets 222; Potassium 3.1*; Sodium 140    Lipid Panel    Component Value Date/Time   CHOL * 11/18/2010 0655    217        ATP III CLASSIFICATION:  <200     mg/dL   Desirable  200-239  mg/dL   Borderline High  >=240    mg/dL   High          TRIG 164* 11/18/2010 0655   HDL 44 11/18/2010 0655   CHOLHDL 4.9 11/18/2010 0655   VLDL 33 11/18/2010 0655   LDLCALC * 11/18/2010 0655    140        Total Cholesterol/HDL:CHD Risk Coronary Heart Disease Risk Table                     Men   Women  1/2 Average Risk   3.4   3.3  Average Risk       5.0   4.4  2 X Average Risk   9.6   7.1  3 X Average Risk  23.4   11.0        Use the calculated Patient Ratio above and the CHD Risk Table to determine the patient's CHD Risk.        ATP III CLASSIFICATION (LDL):  <100     mg/dL   Optimal  100-129  mg/dL   Near or Above                    Optimal  130-159  mg/dL   Borderline  160-189  mg/dL   High  >190     mg/dL   Very High      Wt Readings from Last 3 Encounters:  10/23/15 243 lb (110.224 kg)  09/12/15 261 lb (118.389 kg)  06/05/15 259 lb 8 oz (117.708 kg)      Other studies Reviewed: Additional studies/ records that were reviewed today include: None. Review of the above records demonstrates:  Please see elsewhere in the note.     ASSESSMENT AND PLAN:  PAF:   She has had no symptomatic recurrence of this.  CHADS2VASC is 3 . She does not like taking anticoagulation. She understands the risk of stroke if she should be having a symptomatic paroxysms. She will let me  know if it's ever identified again.   Hypertension: Her blood pressure is elevated somewhat today.  However, she reports that it is well controlled at home. Continue the blood pressure diary.  Obesity:  Weight loss is made difficult by her chronic back problems.   Current medicines are reviewed at length with the patient today.  The patient does not have concerns regarding medicines.  The following changes have been made:  no change  Labs/ tests ordered today include:  No orders of the defined types were placed in this encounter.     Disposition:   FU with me in one year.     Signed, Minus Breeding, MD  10/23/2015 9:53 AM     [facility]

## 2015-10-23 ENCOUNTER — Ambulatory Visit (INDEPENDENT_AMBULATORY_CARE_PROVIDER_SITE_OTHER): Payer: Medicare Other | Admitting: Cardiology

## 2015-10-23 ENCOUNTER — Encounter: Payer: Self-pay | Admitting: Cardiology

## 2015-10-23 VITALS — BP 148/90 | HR 96 | Ht 66.0 in | Wt 243.0 lb

## 2015-10-23 DIAGNOSIS — Z79899 Other long term (current) drug therapy: Secondary | ICD-10-CM | POA: Diagnosis not present

## 2015-10-23 DIAGNOSIS — I480 Paroxysmal atrial fibrillation: Secondary | ICD-10-CM

## 2015-10-23 NOTE — Patient Instructions (Signed)
Your physician wants you to follow-up in: 1 Year. You will receive a reminder letter in the mail two months in advance. If you don't receive a letter, please call our office to schedule the follow-up appointment.  

## 2015-10-25 ENCOUNTER — Ambulatory Visit: Payer: Medicare Other | Admitting: Cardiology

## 2015-12-04 DIAGNOSIS — K921 Melena: Secondary | ICD-10-CM | POA: Diagnosis not present

## 2015-12-04 DIAGNOSIS — I10 Essential (primary) hypertension: Secondary | ICD-10-CM | POA: Diagnosis not present

## 2015-12-04 DIAGNOSIS — E039 Hypothyroidism, unspecified: Secondary | ICD-10-CM | POA: Diagnosis not present

## 2015-12-04 DIAGNOSIS — R739 Hyperglycemia, unspecified: Secondary | ICD-10-CM | POA: Diagnosis not present

## 2015-12-11 DIAGNOSIS — H02834 Dermatochalasis of left upper eyelid: Secondary | ICD-10-CM | POA: Diagnosis not present

## 2015-12-11 DIAGNOSIS — Z961 Presence of intraocular lens: Secondary | ICD-10-CM | POA: Diagnosis not present

## 2015-12-11 DIAGNOSIS — H02831 Dermatochalasis of right upper eyelid: Secondary | ICD-10-CM | POA: Diagnosis not present

## 2015-12-11 DIAGNOSIS — H26493 Other secondary cataract, bilateral: Secondary | ICD-10-CM | POA: Diagnosis not present

## 2015-12-11 DIAGNOSIS — E119 Type 2 diabetes mellitus without complications: Secondary | ICD-10-CM | POA: Diagnosis not present

## 2015-12-11 DIAGNOSIS — H47323 Drusen of optic disc, bilateral: Secondary | ICD-10-CM | POA: Diagnosis not present

## 2015-12-12 ENCOUNTER — Other Ambulatory Visit: Payer: Self-pay | Admitting: Internal Medicine

## 2015-12-12 DIAGNOSIS — R635 Abnormal weight gain: Secondary | ICD-10-CM | POA: Diagnosis not present

## 2015-12-12 DIAGNOSIS — M545 Low back pain: Secondary | ICD-10-CM | POA: Diagnosis not present

## 2015-12-12 DIAGNOSIS — R739 Hyperglycemia, unspecified: Secondary | ICD-10-CM | POA: Diagnosis not present

## 2015-12-12 DIAGNOSIS — I10 Essential (primary) hypertension: Secondary | ICD-10-CM | POA: Diagnosis not present

## 2015-12-12 DIAGNOSIS — E039 Hypothyroidism, unspecified: Secondary | ICD-10-CM | POA: Diagnosis not present

## 2015-12-12 DIAGNOSIS — R921 Mammographic calcification found on diagnostic imaging of breast: Secondary | ICD-10-CM

## 2015-12-12 DIAGNOSIS — Z23 Encounter for immunization: Secondary | ICD-10-CM | POA: Diagnosis not present

## 2015-12-19 DIAGNOSIS — Z961 Presence of intraocular lens: Secondary | ICD-10-CM | POA: Diagnosis not present

## 2015-12-19 DIAGNOSIS — H26492 Other secondary cataract, left eye: Secondary | ICD-10-CM | POA: Diagnosis not present

## 2015-12-20 ENCOUNTER — Ambulatory Visit
Admission: RE | Admit: 2015-12-20 | Discharge: 2015-12-20 | Disposition: A | Discharge status: Home / Self Care | Payer: Medicare Other | Source: Ambulatory Visit | Attending: Internal Medicine | Admitting: Internal Medicine

## 2015-12-20 DIAGNOSIS — R921 Mammographic calcification found on diagnostic imaging of breast: Secondary | ICD-10-CM

## 2016-01-25 ENCOUNTER — Encounter (HOSPITAL_COMMUNITY): Payer: Self-pay | Admitting: Emergency Medicine

## 2016-01-25 ENCOUNTER — Emergency Department (HOSPITAL_COMMUNITY): Payer: Medicare Other

## 2016-01-25 ENCOUNTER — Emergency Department (HOSPITAL_COMMUNITY)
Admission: EM | Admit: 2016-01-25 | Discharge: 2016-01-25 | Disposition: A | Discharge status: Home / Self Care | Payer: Medicare Other | Attending: Emergency Medicine | Admitting: Emergency Medicine

## 2016-01-25 DIAGNOSIS — T148 Other injury of unspecified body region: Secondary | ICD-10-CM | POA: Diagnosis not present

## 2016-01-25 DIAGNOSIS — S52591A Other fractures of lower end of right radius, initial encounter for closed fracture: Secondary | ICD-10-CM | POA: Diagnosis not present

## 2016-01-25 DIAGNOSIS — Y929 Unspecified place or not applicable: Secondary | ICD-10-CM | POA: Insufficient documentation

## 2016-01-25 DIAGNOSIS — W19XXXA Unspecified fall, initial encounter: Secondary | ICD-10-CM | POA: Diagnosis not present

## 2016-01-25 DIAGNOSIS — Z96659 Presence of unspecified artificial knee joint: Secondary | ICD-10-CM | POA: Insufficient documentation

## 2016-01-25 DIAGNOSIS — R55 Syncope and collapse: Secondary | ICD-10-CM | POA: Diagnosis not present

## 2016-01-25 DIAGNOSIS — I10 Essential (primary) hypertension: Secondary | ICD-10-CM | POA: Diagnosis not present

## 2016-01-25 DIAGNOSIS — Z79899 Other long term (current) drug therapy: Secondary | ICD-10-CM | POA: Diagnosis not present

## 2016-01-25 DIAGNOSIS — S5290XA Unspecified fracture of unspecified forearm, initial encounter for closed fracture: Secondary | ICD-10-CM | POA: Diagnosis not present

## 2016-01-25 DIAGNOSIS — Y999 Unspecified external cause status: Secondary | ICD-10-CM | POA: Insufficient documentation

## 2016-01-25 DIAGNOSIS — S299XXA Unspecified injury of thorax, initial encounter: Secondary | ICD-10-CM | POA: Diagnosis not present

## 2016-01-25 DIAGNOSIS — Z7982 Long term (current) use of aspirin: Secondary | ICD-10-CM | POA: Insufficient documentation

## 2016-01-25 DIAGNOSIS — Y939 Activity, unspecified: Secondary | ICD-10-CM | POA: Insufficient documentation

## 2016-01-25 DIAGNOSIS — S52592A Other fractures of lower end of left radius, initial encounter for closed fracture: Secondary | ICD-10-CM | POA: Diagnosis not present

## 2016-01-25 DIAGNOSIS — S52502A Unspecified fracture of the lower end of left radius, initial encounter for closed fracture: Secondary | ICD-10-CM | POA: Diagnosis not present

## 2016-01-25 DIAGNOSIS — S59912A Unspecified injury of left forearm, initial encounter: Secondary | ICD-10-CM | POA: Diagnosis present

## 2016-01-25 HISTORY — DX: Essential (primary) hypertension: I10

## 2016-01-25 LAB — CBC WITH DIFFERENTIAL/PLATELET
BASOS ABS: 0 10*3/uL (ref 0.0–0.1)
Basophils Relative: 0 %
EOS ABS: 0.1 10*3/uL (ref 0.0–0.7)
Eosinophils Relative: 1 %
HCT: 43.1 % (ref 36.0–46.0)
HEMOGLOBIN: 14.1 g/dL (ref 12.0–15.0)
LYMPHS ABS: 2.2 10*3/uL (ref 0.7–4.0)
LYMPHS PCT: 16 %
MCH: 29.9 pg (ref 26.0–34.0)
MCHC: 32.7 g/dL (ref 30.0–36.0)
MCV: 91.5 fL (ref 78.0–100.0)
Monocytes Absolute: 0.7 10*3/uL (ref 0.1–1.0)
Monocytes Relative: 5 %
NEUTROS PCT: 78 %
Neutro Abs: 10.2 10*3/uL — ABNORMAL HIGH (ref 1.7–7.7)
Platelets: 225 10*3/uL (ref 150–400)
RBC: 4.71 MIL/uL (ref 3.87–5.11)
RDW: 13 % (ref 11.5–15.5)
WBC: 13.2 10*3/uL — AB (ref 4.0–10.5)

## 2016-01-25 LAB — URINALYSIS, ROUTINE W REFLEX MICROSCOPIC
BILIRUBIN URINE: NEGATIVE
GLUCOSE, UA: NEGATIVE mg/dL
HGB URINE DIPSTICK: NEGATIVE
KETONES UR: NEGATIVE mg/dL
Leukocytes, UA: NEGATIVE
NITRITE: NEGATIVE
PH: 5 (ref 5.0–8.0)
Protein, ur: NEGATIVE mg/dL
SPECIFIC GRAVITY, URINE: 1.021 (ref 1.005–1.030)

## 2016-01-25 LAB — COMPREHENSIVE METABOLIC PANEL
ALK PHOS: 76 U/L (ref 38–126)
ALT: 22 U/L (ref 14–54)
AST: 26 U/L (ref 15–41)
Albumin: 3.8 g/dL (ref 3.5–5.0)
Anion gap: 10 (ref 5–15)
BUN: 15 mg/dL (ref 6–20)
CALCIUM: 10 mg/dL (ref 8.9–10.3)
CHLORIDE: 99 mmol/L — AB (ref 101–111)
CO2: 26 mmol/L (ref 22–32)
CREATININE: 0.72 mg/dL (ref 0.44–1.00)
GFR calc non Af Amer: >60 mL/min (ref >60)
GLUCOSE: 117 mg/dL — AB (ref 65–99)
Potassium: 3.4 mmol/L — ABNORMAL LOW (ref 3.5–5.1)
SODIUM: 135 mmol/L (ref 135–145)
Total Bilirubin: 0.5 mg/dL (ref 0.3–1.2)
Total Protein: 7.2 g/dL (ref 6.5–8.1)

## 2016-01-25 MED ORDER — FENTANYL CITRATE (PF) 100 MCG/2ML IJ SOLN
50.0000 ug | Freq: Once | INTRAMUSCULAR | Status: AC
  Administered 2016-01-25: 50 ug via INTRAVENOUS
  Filled 2016-01-25: qty 2

## 2016-01-25 MED ORDER — HYDROCODONE-ACETAMINOPHEN 10-325 MG PO TABS: 1.0000 | ORAL_TABLET | ORAL | Status: DC | PRN

## 2016-01-25 MED ORDER — SODIUM CHLORIDE 0.9 % IV BOLUS (SEPSIS)
500.0000 mL | Freq: Once | INTRAVENOUS | Status: AC
  Administered 2016-01-25: 500 mL via INTRAVENOUS

## 2016-01-25 NOTE — ED Notes (Signed)
Per [facility], pt from home, possibly became dizzy and had a near syncopal episode. Pt fell onto back patio onto her L arm, defmority to left forarm. Good cap refill. Given 125mcg fentanyl. Pt denies hitting head, denies LOC. Pt is AAOX4.

## 2016-01-25 NOTE — ED Notes (Signed)
Pt returned from xray

## 2016-01-25 NOTE — Discharge Instructions (Signed)
Wrist Fracture °A wrist fracture is a break or crack in one of the bones of your wrist. Your wrist is made up of eight small bones at the palm of your hand (carpal bones) and two long bones that make up your forearm (radius and ulna). °CAUSES °· A direct blow to the wrist. °· Falling on an outstretched hand. °· Trauma, such as a car accident or a fall. °RISK FACTORS °Risk factors for wrist fracture include: °· Participating in contact and high-risk sports, such as skiing, biking, and ice skating. °· Taking steroid medicines. °· Smoking. °· Being female. °· Being Caucasian. °· Drinking more than three alcoholic beverages per day. °· Having low or lowered bone density (osteoporosis or osteopenia). °· Age. Older adults have decreased bone density. °· Women who have had menopause. °· History of previous fractures. °SIGNS AND SYMPTOMS °Symptoms of wrist fractures include tenderness, bruising, and inflammation. Additionally, the wrist may hang in an odd position or appear deformed. °DIAGNOSIS °Diagnosis may include: °· Physical exam. °· X-ray. °TREATMENT °Treatment depends on many factors, including the nature and location of the fracture, your age, and your activity level. Treatment for wrist fracture can be nonsurgical or surgical. °Nonsurgical Treatment °A plaster cast or splint may be applied to your wrist if the bone is in a good position. If the fracture is not in good position, it may be necessary for your health care provider to realign it before applying a splint or cast. Usually, a cast or splint will be worn for several weeks. °Surgical Treatment °Sometimes the position of the bone is so far out of place that surgery is required to apply a device to hold it together as it heals. Depending on the fracture, there are a number of options for holding the bone in place while it heals, such as a cast and metal pins. °HOME CARE INSTRUCTIONS °· Keep your injured wrist elevated and move your fingers as much as  possible. °· Do not put pressure on any part of your cast or splint. It may break. °· Use a plastic bag to protect your cast or splint from water while bathing or showering. Do not lower your cast or splint into water. °· Take medicines only as directed by your health care provider. °· Keep your cast or splint clean and dry. If it becomes wet, damaged, or suddenly feels too tight, contact your health care provider right away. °· Do not use any tobacco products including cigarettes, chewing tobacco, or electronic cigarettes. Tobacco can delay bone healing. If you need help quitting, ask your health care provider. °· Keep all follow-up visits as directed by your health care provider. This is important. °· Ask your health care provider if you should take supplements of calcium and vitamins C and D to promote bone healing. °SEEK MEDICAL CARE IF: °· Your cast or splint is damaged, breaks, or gets wet. °· You have a fever. °· You have chills. °· You have continued severe pain or more swelling than you did before the cast was put on. °SEEK IMMEDIATE MEDICAL CARE IF: °· Your hand or fingernails on the injured arm turn blue or gray, or feel cold or numb. °· You have decreased feeling in the fingers of your injured arm. °MAKE SURE YOU: °· Understand these instructions. °· Will watch your condition. °· Will get help right away if you are not doing well or get worse. °  °This information is not intended to replace advice given to you by your   health care provider. Make sure you discuss any questions you have with your health care provider.   Document Released: 04/23/2005 Document Revised: 04/04/2015 Document Reviewed: 08/01/2011 Elsevier Interactive Patient Education 2016 [employer].    Near-Syncope Near-syncope (commonly known as near fainting) is sudden weakness, dizziness, or feeling like you might pass out. During an episode of near-syncope, you may also develop pale skin, have tunnel vision, or feel sick to your  stomach (nauseous). Near-syncope may occur when getting up after sitting or while standing for a long time. It is caused by a sudden decrease in blood flow to the brain. This decrease can result from various causes or triggers, most of which are not serious. However, because near-syncope can sometimes be a sign of something serious, a medical evaluation is required. The specific cause is often not determined. HOME CARE INSTRUCTIONS  Monitor your condition for any changes. The following actions may help to alleviate any discomfort you are experiencing:  Have someone stay with you until you feel stable.  Lie down right away and prop your feet up if you start feeling like you might faint. Breathe deeply and steadily. Wait until all the symptoms have passed. Most of these episodes last only a few minutes. You may feel tired for several hours.   Drink enough fluids to keep your urine clear or pale yellow.   If you are taking blood pressure or heart medicine, get up slowly when seated or lying down. Take several minutes to sit and then stand. This can reduce dizziness.  Follow up with your health care provider as directed. SEEK IMMEDIATE MEDICAL CARE IF:   You have a severe headache.   You have unusual pain in the chest, abdomen, or back.   You are bleeding from the mouth or rectum, or you have black or tarry stool.   You have an irregular or very fast heartbeat.   You have repeated fainting or have seizure-like jerking during an episode.   You faint when sitting or lying down.   You have confusion.   You have difficulty walking.   You have severe weakness.   You have vision problems.  MAKE SURE YOU:   Understand these instructions.  Will watch your condition.  Will get help right away if you are not doing well or get worse.   This information is not intended to replace advice given to you by your health care provider. Make sure you discuss any questions you have with  your health care provider.   Document Released: 07/14/2005 Document Revised: 07/19/2013 Document Reviewed: 12/17/2012 Elsevier Interactive Patient Education [employer].

## 2016-01-25 NOTE — Progress Notes (Signed)
Orthopedic Tech Progress Note Patient Details:  Shirley Leblanc [birth year] [id number]  Ortho Devices Type of Ortho Device: Ace wrap, Arm sling, Sugartong splint Ortho Device/Splint Location: LUE wrist Ortho Device/Splint Interventions: Ordered, Application   Braulio Bosch 01/25/2016, 6:14 PM

## 2016-01-25 NOTE — ED Provider Notes (Signed)
CSN: [id number]     Arrival date & time 01/25/16  1525 History   First MD Initiated Contact with Patient 01/25/16 [phone]     Chief Complaint  Patient presents with  . Near Syncope  . Fall  . Arm Injury     (Consider location/radiation/quality/duration/timing/severity/associated sxs/prior Treatment) HPI Patient was in her normal state of health when she was walking into her house. States she became lightheaded and fell to ground. Landed on her left extended upper extremity. Denies head injury or loss of consciousness. No neck pain. No nausea or vomiting. Denied chest pain or shortness of breath. Complains of left wrist pain. Noted deformity by EMS and splint placed. No weakness or numbness. No recent fevers or chills. No urinary symptoms or cough. Patient does not wear oxygen at baseline. Past Medical History  Diagnosis Date  . DJD (degenerative joint disease)   . Bursitis   . Hyperlipemia   . Depression   . GERD (gastroesophageal reflux disease)   . Obesities, morbid ([place])   . Urinary incontinence   . Hypertension    Past Surgical History  Procedure Laterality Date  . Neck surgery    . Cardiac catheterization    . Total knee arthroplasty      x2  . Vaginal hysterectomy    . Cholecystectomy N/A 05/14/2015    Procedure: LAPAROSCOPIC CHOLECYSTECTOMY;  Surgeon: Coralie Keens, MD;  Location: [facility] OR;  Service: General;  Laterality: N/A;   Family History  Problem Relation Age of Onset  . Lung disease    . Heart attack    . Lung disease Mother   . Heart attack Father    Social History  Substance Use Topics  . Smoking status: Never Smoker   . Smokeless tobacco: None  . Alcohol Use: No   OB History    No data available     Review of Systems  Constitutional: Negative for fever and chills.  HENT: Negative for facial swelling.   Respiratory: Negative for cough and shortness of breath.   Cardiovascular: Negative for chest pain, palpitations and leg swelling.   Gastrointestinal: Negative for nausea, vomiting, abdominal pain and diarrhea.  Genitourinary: Negative for dysuria, frequency, hematuria and flank pain.  Musculoskeletal: Positive for arthralgias. Negative for back pain and neck pain.  Skin: Negative for rash and wound.  Neurological: Positive for dizziness and light-headedness. Negative for syncope, speech difficulty, weakness, numbness and headaches.  All other systems reviewed and are negative.     Allergies  Contrast media; Penicillins; Sulfa antibiotics; and Morphine and related  Home Medications   Prior to Admission medications   Medication Sig Start Date End Date Taking? Authorizing Provider  aspirin 81 MG EC tablet Take 81 mg by mouth daily.     Yes Historical Provider, MD  atorvastatin (LIPITOR) 10 MG tablet Take 10 mg by mouth daily.   Yes Historical Provider, MD  Cholecalciferol (VITAMIN D3) 2000 UNITS TABS Take 2,000 Units by mouth daily.    Yes Historical Provider, MD  clonazePAM (KLONOPIN) 0.5 MG tablet Take 0.5 mg by mouth 2 (two) times daily as needed for anxiety.   Yes Historical Provider, MD  furosemide (LASIX) 20 MG tablet Take 1 tablet (20 mg total) by mouth daily. 05/17/15  Yes Modena Jansky, MD  levothyroxine (SYNTHROID, LEVOTHROID) 50 MCG tablet Take 50 mcg by mouth daily.     Yes Historical Provider, MD  meclizine (ANTIVERT) 25 MG tablet Take 1 tablet (25 mg total) by mouth 3 (three)  times daily. 09/13/15  Yes Shari Upstill, PA-C  quinapril (ACCUPRIL) 40 MG tablet Take 40 mg by mouth daily.    Yes Historical Provider, MD  sertraline (ZOLOFT) 100 MG tablet Take 100 mg by mouth daily.    Yes Historical Provider, MD  HYDROcodone-acetaminophen (NORCO) 10-325 MG tablet Take 1-2 tablets by mouth every 4 (four) hours as needed for moderate pain. 01/25/16   Julianne Rice, MD  ondansetron (ZOFRAN ODT) 8 MG disintegrating tablet Take 1 tablet (8 mg total) by mouth every 8 (eight) hours as needed for nausea or  vomiting. Patient not taking: Reported on 01/25/2016 09/13/15   Charlann Lange, PA-C  ondansetron [facility]) 4 MG/2ML SOLN injection Inject 2 mLs (4 mg total) into the vein once. Patient not taking: Reported on 01/25/2016 09/13/15   Charlann Lange, PA-C   BP 132/76 mmHg  Pulse 95  Temp(Src) 98.5 F (36.9 C)  Resp 16  SpO2 96% Physical Exam  Constitutional: She is oriented to person, place, and time. She appears well-developed and well-nourished. No distress.  HENT:  Head: Normocephalic and atraumatic.  Mouth/Throat: Oropharynx is clear and moist. No oropharyngeal exudate.  Eyes: EOM are normal. Pupils are equal, round, and reactive to light.  Neck: Normal range of motion. Neck supple.  No posterior midline cervical tenderness to palpation.  Cardiovascular: Normal rate and regular rhythm.  Exam reveals no gallop and no friction rub.   No murmur heard. Pulmonary/Chest: Effort normal and breath sounds normal. No respiratory distress. She has no wheezes. She has no rales. She exhibits no tenderness.  Abdominal: Soft. Bowel sounds are normal. She exhibits no distension and no mass. There is no tenderness. There is no rebound and no guarding.  Musculoskeletal: She exhibits edema and tenderness.  Patient with obvious left distal forearm deformity and swelling. Full range of motion of the left elbow and shoulder. Pelvis stable. No midline thoracic or lumbar tenderness. Distal pulses equal in all extremities.  Neurological: She is alert and oriented to person, place, and time.  5/5 motor in extremities. Sensation is fully intact.  Skin: Skin is warm and dry. No rash noted. No erythema.  Psychiatric: She has a normal mood and affect. Her behavior is normal.  Nursing note and vitals reviewed.   ED Course  Procedures (including critical care time) Labs Review Labs Reviewed  CBC WITH DIFFERENTIAL/PLATELET - Abnormal; Notable for the following:    WBC 13.2 (*)    Neutro Abs 10.2 (*)    All other  components within normal limits  COMPREHENSIVE METABOLIC PANEL - Abnormal; Notable for the following:    Potassium 3.4 (*)    Chloride 99 (*)    Glucose, Bld 117 (*)    All other components within normal limits  URINALYSIS, ROUTINE W REFLEX MICROSCOPIC (NOT AT [facility])    Imaging Review Dg Chest 2 View  01/25/2016  CLINICAL DATA:  Fall.  Initial encounter. EXAM: CHEST  2 VIEW COMPARISON:  05/15/2015 FINDINGS: Chronic cardiopericardial enlargement and aortic tortuosity. Low volume chest with interstitial crowding. There is no edema, consolidation, effusion, or pneumothorax. Dorsal column stimulators. Remote, healed proximal left humerus fracture. IMPRESSION: Low volume chest without acute finding. Chronic cardiomegaly. Electronically Signed   By: Monte Fantasia M.D.   On: 01/25/2016 17:08   Dg Forearm Left  01/25/2016  CLINICAL DATA:  Fall onto left arm. Left wrist pain. Initial encounter. EXAM: LEFT FOREARM - 2 VIEW COMPARISON:  None. FINDINGS: There is a distal radius fracture with dorsal impaction causing  wrist tilting. Probable intra-articular extension seen in the lateral projection. No more proximal fracture detected. Osteopenia. First CMC osteoarthritis. IMPRESSION: Dorsally impacted distal radius fracture, likely intra-articular. Recommend dedicated wrist series. Electronically Signed   By: Monte Fantasia M.D.   On: 01/25/2016 17:05   Dg Wrist 2 Views Left  01/25/2016  CLINICAL DATA:  Fall.  Pain. EXAM: LEFT WRIST - 2 VIEW COMPARISON:  No recent prior. FINDINGS: Posteriorly angulated displaced fracture of the distal left radius is noted. Displaced ulnar styloid fracture is present. Diffuse osteopenia and degenerative change. Degenerative change particularly prominent about the radiocarpal joint and first carpometacarpal joint. IMPRESSION: Posteriorly angulated displaced fracture of the distal radius. Displaced ulnar styloid fracture. Electronically Signed   By: Marcello Moores  Register   On:  01/25/2016 17:31   I have personally reviewed and evaluated these images and lab results as part of my medical decision-making.   EKG Interpretation   Date/Time:  Friday January 25 2016 15:38:33 EDT Ventricular Rate:  98 PR Interval:    QRS Duration: 81 QT Interval:  427 QTC Calculation: 546 R Axis:   -42 Text Interpretation:  Sinus rhythm Left anterior fascicular block  Borderline T abnormalities, anterior leads Prolonged QT interval Baseline  wander in lead(s) V3 V5 V6 No significant change since last tracing  Confirmed by [facility]  MD, MARTHA [phone]) on 01/25/2016 3:40:52 PM Also  confirmed by Lita Mains  MD, Rylee Huestis ([id number])  on 01/25/2016 8:06:50 PM      MDM   Final diagnoses:  Near syncope  Distal radius fracture, left, closed, initial encounter    No more dizziness or lightheadedness. Patient ambulatory in the emergency department. Left short arm sugar tong splint placed. Discussed with Dr. Lenon Curt. Recommends follow-up in his office early next week. Also advised patient to follow-up with her primary physician. Return precautions given.  Julianne Rice, MD 01/25/16 2012

## 2016-01-30 DIAGNOSIS — S52532A Colles' fracture of left radius, initial encounter for closed fracture: Secondary | ICD-10-CM | POA: Diagnosis not present

## 2016-01-31 DIAGNOSIS — S52502A Unspecified fracture of the lower end of left radius, initial encounter for closed fracture: Secondary | ICD-10-CM | POA: Diagnosis not present

## 2016-01-31 DIAGNOSIS — S52532A Colles' fracture of left radius, initial encounter for closed fracture: Secondary | ICD-10-CM | POA: Diagnosis not present

## 2016-01-31 DIAGNOSIS — Y939 Activity, unspecified: Secondary | ICD-10-CM | POA: Diagnosis not present

## 2016-01-31 DIAGNOSIS — Y999 Unspecified external cause status: Secondary | ICD-10-CM | POA: Diagnosis not present

## 2016-01-31 DIAGNOSIS — Y929 Unspecified place or not applicable: Secondary | ICD-10-CM | POA: Diagnosis not present

## 2016-01-31 DIAGNOSIS — X58XXXA Exposure to other specified factors, initial encounter: Secondary | ICD-10-CM | POA: Diagnosis not present

## 2016-02-05 DIAGNOSIS — G894 Chronic pain syndrome: Secondary | ICD-10-CM | POA: Diagnosis not present

## 2016-02-05 DIAGNOSIS — Z79899 Other long term (current) drug therapy: Secondary | ICD-10-CM | POA: Diagnosis not present

## 2016-02-05 DIAGNOSIS — Z79891 Long term (current) use of opiate analgesic: Secondary | ICD-10-CM | POA: Diagnosis not present

## 2016-02-05 DIAGNOSIS — M961 Postlaminectomy syndrome, not elsewhere classified: Secondary | ICD-10-CM | POA: Diagnosis not present

## 2016-02-05 DIAGNOSIS — M542 Cervicalgia: Secondary | ICD-10-CM | POA: Diagnosis not present

## 2016-02-05 DIAGNOSIS — Z4542 Encounter for adjustment and management of neuropacemaker (brain) (peripheral nerve) (spinal cord): Secondary | ICD-10-CM | POA: Diagnosis not present

## 2016-02-11 DIAGNOSIS — I10 Essential (primary) hypertension: Secondary | ICD-10-CM | POA: Diagnosis not present

## 2016-02-11 DIAGNOSIS — M545 Low back pain: Secondary | ICD-10-CM | POA: Diagnosis not present

## 2016-02-11 DIAGNOSIS — S52532D Colles' fracture of left radius, subsequent encounter for closed fracture with routine healing: Secondary | ICD-10-CM | POA: Diagnosis not present

## 2016-02-11 DIAGNOSIS — E039 Hypothyroidism, unspecified: Secondary | ICD-10-CM | POA: Diagnosis not present

## 2016-02-22 ENCOUNTER — Ambulatory Visit (INDEPENDENT_AMBULATORY_CARE_PROVIDER_SITE_OTHER): Payer: Medicare Other

## 2016-02-22 ENCOUNTER — Encounter: Payer: Self-pay | Admitting: Family Medicine

## 2016-02-22 ENCOUNTER — Ambulatory Visit (INDEPENDENT_AMBULATORY_CARE_PROVIDER_SITE_OTHER): Payer: Medicare Other | Admitting: Family Medicine

## 2016-02-22 VITALS — BP 111/74 | HR 88 | Temp 97.6°F | Ht 63.5 in | Wt 265.0 lb

## 2016-02-22 DIAGNOSIS — R739 Hyperglycemia, unspecified: Secondary | ICD-10-CM

## 2016-02-22 DIAGNOSIS — R0600 Dyspnea, unspecified: Secondary | ICD-10-CM | POA: Diagnosis not present

## 2016-02-22 DIAGNOSIS — I10 Essential (primary) hypertension: Secondary | ICD-10-CM | POA: Diagnosis not present

## 2016-02-22 DIAGNOSIS — E785 Hyperlipidemia, unspecified: Secondary | ICD-10-CM | POA: Diagnosis not present

## 2016-02-22 DIAGNOSIS — Z7901 Long term (current) use of anticoagulants: Secondary | ICD-10-CM | POA: Diagnosis not present

## 2016-02-22 DIAGNOSIS — I480 Paroxysmal atrial fibrillation: Secondary | ICD-10-CM

## 2016-02-22 DIAGNOSIS — F32A Depression, unspecified: Secondary | ICD-10-CM

## 2016-02-22 DIAGNOSIS — E039 Hypothyroidism, unspecified: Secondary | ICD-10-CM | POA: Diagnosis not present

## 2016-02-22 DIAGNOSIS — E1169 Type 2 diabetes mellitus with other specified complication: Secondary | ICD-10-CM | POA: Insufficient documentation

## 2016-02-22 DIAGNOSIS — F329 Major depressive disorder, single episode, unspecified: Secondary | ICD-10-CM

## 2016-02-22 LAB — BAYER DCA HB A1C WAIVED: HB A1C: 5.6 % (ref <7.0)

## 2016-02-22 MED ORDER — DULOXETINE HCL 30 MG PO CPEP: 30.0000 mg | ORAL_CAPSULE | Freq: Every day | ORAL | 0 refills | Status: DC

## 2016-02-22 MED ORDER — CLONAZEPAM 0.5 MG PO TABS: 0.5000 mg | ORAL_TABLET | Freq: Two times a day (BID) | ORAL | 2 refills | Status: DC | PRN

## 2016-02-22 NOTE — Progress Notes (Signed)
Subjective:  Patient ID: Shirley Leblanc, female    DOB: November 21, 1945  Age: 70 y.o. MRN: [id number]  CC: Establish Care   HPI Shirley Leblanc presents forNeed to establish with a new physician. She is resident of [facility] who recently moved to this area to live with her granddaughter. She moved because she has very poor balance. Due to this she has had approximately 10 falls this year. She recently fractured her left shoulder. About one month ago she fell and suffered a Colles' fracture of the left wrist. She had to have surgery on that. She went through physical therapy last year multiple treatments in the home to help her with her balance. Unfortunately she could not meet her goals. She is now needing evaluation to get an electric wheelchair. She is able to make transfers. She can take a few steps. However she gets out of breath easily. Of note is that she has an implanted vertebral stimulator due to chronic back pain. Additionally she has total knee replacements that she states are 24-46 years old. She had them done very young due to terrible arthritis. She also carries a diagnosis of chronic advanced lumbar disc and facet degeneration She has a relationship with Dr. Vira Blanco be of the pain clinic in [place]. She's had spinal injections. She is also relying on hydrocodone 2 pills 3 times a day until her recent fracture at which time she went to 4 pills twice daily skipping make that 2 pills 4 times daily.  Patient presents for follow-up on  thyroid. The patient has a history of hypothyroidism for many years. It has been stable recently. Pt. denies any change in  voice, loss of hair, heat or cold intolerance. Energy level has been adequate to good. Patient denies constipation and diarrhea. No myxedema. Medication is as noted below. Verified that pt is taking it daily on an empty stomach. Well tolerated.   follow-up of hypertension. Patient has no history of headache chest pain or shortness of breath or  recent cough. Patient also denies symptoms of TIA such as numbness weakness lateralizing. Patient checks  blood pressure at home and has not had any elevated readings recently. Patient denies side effects from his medication. States taking it regularly.  Initially there is a note that she suffers from hyperglycemia. She also has a Freestyle light glucose monitor mentioned in her medications list. However, there is no diabetes medication listed. Her records from [place] are reviewed.    Patient in for follow-up of atrial fibrillation. Patient denies any recent bouts of chest pain or palpitations. Additionally, patient is taking anticoagulants. Patient denies any recent excessive bleeding episodes including epistaxis, bleeding from the gums, genitalia, rectal bleeding or hematuria. Additionally there has been no excessive bruising. She had an episode noted 05/16/2015 of rapid ventricular response at which time she was started on Eliquis her chadsvasc2 score that time was 3. Of note is that she was diagnosed with dyspnea in October 2016 and started on an Advair inhaler. She does not use that  Patient is also taking clonazepam. She's been through a lot of depression. She also takes sertraline. GAD 7 : Generalized Anxiety Score 02/22/2016  Nervous, Anxious, on Edge 1  Control/stop worrying 3  Worry too much - different things 3  Trouble relaxing 0  Restless 0  Easily annoyed or irritable 3  Afraid - awful might happen 2  Total GAD 7 Score 12  Anxiety Difficulty Not difficult at all  Depression screen PHQ 2/9 02/22/2016  Decreased Interest 1  Down, Depressed, Hopeless 1  PHQ - 2 Score 2  Altered sleeping 2  Tired, decreased energy 2  Change in appetite 2  Feeling bad or failure about yourself  1  Trouble concentrating 2  Moving slowly or fidgety/restless 0  Suicidal thoughts 0  PHQ-9 Score 11     History Shirley Leblanc has a past medical history of Bursitis; Depression;  DJD (degenerative joint disease); GERD (gastroesophageal reflux disease); Hyperlipemia; Hypertension; Obesities, morbid ([place]); and Urinary incontinence.   She has a past surgical history that includes Neck surgery; Cardiac catheterization; Total knee arthroplasty; Vaginal hysterectomy; and Cholecystectomy (N/A, 05/14/2015).   Her family history includes Heart attack in her father; Lung disease in her mother.She reports that she has never smoked. She does not have any smokeless tobacco history on file. She reports that she does not drink alcohol or use drugs.    ROS Review of Systems  Constitutional: Negative for activity change, appetite change and fever.  HENT: Negative for congestion, rhinorrhea and sore throat.   Eyes: Negative for visual disturbance.  Respiratory: Negative for cough and shortness of breath.   Cardiovascular: Negative for chest pain and palpitations.  Gastrointestinal: Negative for abdominal pain, diarrhea and nausea.  Genitourinary: Negative for dysuria.  Musculoskeletal: Negative for arthralgias and myalgias.    Objective:  BP 111/74 (BP Location: Right Wrist, Patient Position: Sitting, Cuff Size: Large)   Pulse 88   Temp 97.6 F (36.4 C) (Oral)   Ht 5' 3.5" (1.613 m)   Wt 265 lb (120.2 kg)   SpO2 94%   BMI 46.21 kg/m   BP Readings from Last 3 Encounters:  02/22/16 111/74  01/25/16 159/89  10/23/15 (!) 148/90    Wt Readings from Last 3 Encounters:  02/22/16 265 lb (120.2 kg)  10/23/15 243 lb (110.2 kg)  09/12/15 261 lb (118.4 kg)     Physical Exam  Constitutional: She is oriented to person, place, and time. She appears well-developed and well-nourished. No distress.  HENT:  Head: Normocephalic and atraumatic.  Right Ear: External ear normal.  Left Ear: External ear normal.  Nose: Nose normal.  Mouth/Throat: Oropharynx is clear and moist.  Eyes: Conjunctivae and EOM are normal. Pupils are equal, round, and reactive to light.  Neck: Normal  range of motion. Neck supple. No thyromegaly present.  Cardiovascular: Normal rate, regular rhythm and normal heart sounds.   No murmur heard. Pulmonary/Chest: Effort normal and breath sounds normal. No respiratory distress. She has no wheezes. She has no rales.  Abdominal: Soft. Bowel sounds are normal. She exhibits no distension. There is no tenderness.  Lymphadenopathy:    She has no cervical adenopathy.  Neurological: She is alert and oriented to person, place, and time. She has normal reflexes.  Skin: Skin is warm and dry.  Psychiatric: She has a normal mood and affect. Her behavior is normal. Judgment and thought content normal.     Lab Results  Component Value Date   WBC 13.2 (H) 01/25/2016   HGB 14.1 01/25/2016   HCT 43.1 01/25/2016   PLT 225 01/25/2016   GLUCOSE 117 (H) 01/25/2016   CHOL (H) 11/18/2010    217        ATP III CLASSIFICATION:  <200     mg/dL   Desirable  200-239  mg/dL   Borderline High  >=240    mg/dL   High          TRIG 164 (  H) 11/18/2010   HDL 44 11/18/2010   LDLCALC (H) 11/18/2010    140        Total Cholesterol/HDL:CHD Risk Coronary Heart Disease Risk Table                     Men   Women  1/2 Average Risk   3.4   3.3  Average Risk       5.0   4.4  2 X Average Risk   9.6   7.1  3 X Average Risk  23.4   11.0        Use the calculated Patient Ratio above and the CHD Risk Table to determine the patient's CHD Risk.        ATP III CLASSIFICATION (LDL):  <100     mg/dL   Optimal  100-129  mg/dL   Near or Above                    Optimal  130-159  mg/dL   Borderline  160-189  mg/dL   High  >190     mg/dL   Very High   ALT 22 01/25/2016   AST 26 01/25/2016   NA 135 01/25/2016   K 3.4 (L) 01/25/2016   CL 99 (L) 01/25/2016   CREATININE 0.72 01/25/2016   BUN 15 01/25/2016   CO2 26 01/25/2016   TSH 3.066 11/17/2010   HGBA1C (H) 02/22/2008    6.2 (NOTE)   The ADA recommends the following therapeutic goal for glycemic   control related to  Hgb A1C measurement:   Goal of Therapy:   < 7.0% Hgb A1C   Reference: American Diabetes Association: Clinical Practice   Recommendations 2008, Diabetes Care,  2008, 31:(Suppl 1).    Dg Chest 2 View  Result Date: 01/25/2016 CLINICAL DATA:  Fall.  Initial encounter. EXAM: CHEST  2 VIEW COMPARISON:  05/15/2015 FINDINGS: Chronic cardiopericardial enlargement and aortic tortuosity. Low volume chest with interstitial crowding. There is no edema, consolidation, effusion, or pneumothorax. Dorsal column stimulators. Remote, healed proximal left humerus fracture. IMPRESSION: Low volume chest without acute finding. Chronic cardiomegaly. Electronically Signed   By: Monte Fantasia M.D.   On: 01/25/2016 17:08   Dg Forearm Left  Result Date: 01/25/2016 CLINICAL DATA:  Fall onto left arm. Left wrist pain. Initial encounter. EXAM: LEFT FOREARM - 2 VIEW COMPARISON:  None. FINDINGS: There is a distal radius fracture with dorsal impaction causing wrist tilting. Probable intra-articular extension seen in the lateral projection. No more proximal fracture detected. Osteopenia. First CMC osteoarthritis. IMPRESSION: Dorsally impacted distal radius fracture, likely intra-articular. Recommend dedicated wrist series. Electronically Signed   By: Monte Fantasia M.D.   On: 01/25/2016 17:05   Dg Wrist 2 Views Left  Result Date: 01/25/2016 CLINICAL DATA:  Fall.  Pain. EXAM: LEFT WRIST - 2 VIEW COMPARISON:  No recent prior. FINDINGS: Posteriorly angulated displaced fracture of the distal left radius is noted. Displaced ulnar styloid fracture is present. Diffuse osteopenia and degenerative change. Degenerative change particularly prominent about the radiocarpal joint and first carpometacarpal joint. IMPRESSION: Posteriorly angulated displaced fracture of the distal radius. Displaced ulnar styloid fracture. Electronically Signed   By: Marcello Moores  Register   On: 01/25/2016 17:31    Assessment & Plan:   Shirley Leblanc was seen today for establish  care.  Diagnoses and all orders for this visit:  Paroxysmal atrial fibrillation ([place]) -     CBC with Differential/Platelet -  CMP14+EGFR  Chronic anticoagulation - Eliquis, CHADS2VASC=3 -     CBC with Differential/Platelet -     CMP14+EGFR  Essential hypertension -     CBC with Differential/Platelet -     CMP14+EGFR -     Urinalysis, Complete  Hyperlipidemia -     CBC with Differential/Platelet -     CMP14+EGFR -     Lipid panel  Hyperglycemia -     CBC with Differential/Platelet -     CMP14+EGFR -     Bayer DCA Hb A1c Waived  Hypothyroidism, unspecified hypothyroidism type -     CBC with Differential/Platelet -     CMP14+EGFR -     TSH + free T4  Dyspnea -     PR BREATHING CAPACITY TEST -     DG Chest 2 View; Future  Other orders -     DULoxetine (CYMBALTA) 30 MG capsule; Take 1 capsule (30 mg total) by mouth daily. For one week then two daily. Take with a full stomach at suppertime -     clonazePAM (KLONOPIN) 0.5 MG tablet; Take 1 tablet (0.5 mg total) by mouth 2 (two) times daily as needed for anxiety.    A1c in normal range. Not on meds for DM. Resp. Assess nml for PFT, CXR  Pt. Is severely debilitated due to arthritis, spinal stenosis. She is at maximal medical intervention including pain mgmt. She is at high risk for falling due to her medical condition as well as her treatments (opiates, benzodiazepines.)  I have discontinued Shirley Leblanc's sertraline. I have also changed her clonazePAM. Additionally, I am having her start on DULoxetine. Lastly, I am having her maintain her levothyroxine, aspirin, Vitamin D3, quinapril, atorvastatin, furosemide, meclizine, ondansetron, HYDROcodone-acetaminophen, and tiZANidine.  Meds ordered this encounter  Medications  . tiZANidine (ZANAFLEX) 2 MG tablet    Sig: Take 2 mg by mouth 2 (two) times daily.  . DULoxetine (CYMBALTA) 30 MG capsule    Sig: Take 1 capsule (30 mg total) by mouth daily. For one week then two daily.  Take with a full stomach at suppertime    Dispense:  60 capsule    Refill:  0  . clonazePAM (KLONOPIN) 0.5 MG tablet    Sig: Take 1 tablet (0.5 mg total) by mouth 2 (two) times daily as needed for anxiety.    Dispense:  60 tablet    Refill:  2     Follow-up: Return in about 6 weeks (around 04/04/2016).  Claretta Fraise, M.D.

## 2016-02-22 NOTE — Progress Notes (Signed)
Your chest x-ray looked normal. Thanks, WS.

## 2016-02-23 LAB — CBC WITH DIFFERENTIAL/PLATELET
BASOS: 0 %
Basophils Absolute: 0 10*3/uL (ref 0.0–0.2)
EOS (ABSOLUTE): 0.4 10*3/uL (ref 0.0–0.4)
Eos: 3 %
Hematocrit: 42.2 % (ref 34.0–46.6)
Hemoglobin: 14.1 g/dL (ref 11.1–15.9)
Immature Grans (Abs): 0 10*3/uL (ref 0.0–0.1)
Immature Granulocytes: 0 %
LYMPHS ABS: 3.5 10*3/uL — AB (ref 0.7–3.1)
Lymphs: 33 %
MCH: 30.1 pg (ref 26.6–33.0)
MCHC: 33.4 g/dL (ref 31.5–35.7)
MCV: 90 fL (ref 79–97)
MONOS ABS: 0.7 10*3/uL (ref 0.1–0.9)
Monocytes: 7 %
NEUTROS ABS: 5.9 10*3/uL (ref 1.4–7.0)
Neutrophils: 57 %
PLATELETS: 277 10*3/uL (ref 150–379)
RBC: 4.69 x10E6/uL (ref 3.77–5.28)
RDW: 13.7 % (ref 12.3–15.4)
WBC: 10.6 10*3/uL (ref 3.4–10.8)

## 2016-02-23 LAB — CMP14+EGFR
A/G RATIO: 1.2 (ref 1.2–2.2)
ALT: 18 IU/L (ref 0–32)
AST: 20 IU/L (ref 0–40)
Albumin: 3.9 g/dL (ref 3.5–4.8)
Alkaline Phosphatase: 94 IU/L (ref 39–117)
BILIRUBIN TOTAL: 0.4 mg/dL (ref 0.0–1.2)
BUN/Creatinine Ratio: 27 (ref 12–28)
BUN: 19 mg/dL (ref 8–27)
CALCIUM: 10 mg/dL (ref 8.7–10.3)
CHLORIDE: 94 mmol/L — AB (ref 96–106)
CO2: 28 mmol/L (ref 18–29)
Creatinine, Ser: 0.71 mg/dL (ref 0.57–1.00)
GFR calc Af Amer: 100 mL/min/{1.73_m2} (ref >59)
GFR, EST NON AFRICAN AMERICAN: 87 mL/min/{1.73_m2} (ref >59)
GLOBULIN, TOTAL: 3.2 g/dL (ref 1.5–4.5)
Glucose: 111 mg/dL — ABNORMAL HIGH (ref 65–99)
POTASSIUM: 3.9 mmol/L (ref 3.5–5.2)
SODIUM: 138 mmol/L (ref 134–144)
Total Protein: 7.1 g/dL (ref 6.0–8.5)

## 2016-02-23 LAB — LIPID PANEL
CHOL/HDL RATIO: 4 ratio (ref 0.0–4.4)
Cholesterol, Total: 196 mg/dL (ref 100–199)
HDL: 49 mg/dL (ref >39)
LDL Calculated: 106 mg/dL — ABNORMAL HIGH (ref 0–99)
TRIGLYCERIDES: 206 mg/dL — AB (ref 0–149)
VLDL Cholesterol Cal: 41 mg/dL — ABNORMAL HIGH (ref 5–40)

## 2016-02-23 LAB — TSH+FREE T4
Free T4: 1.18 ng/dL (ref 0.82–1.77)
TSH: 4.29 u[IU]/mL (ref 0.450–4.500)

## 2016-03-04 DIAGNOSIS — M545 Low back pain: Secondary | ICD-10-CM | POA: Diagnosis not present

## 2016-03-04 DIAGNOSIS — M542 Cervicalgia: Secondary | ICD-10-CM | POA: Diagnosis not present

## 2016-03-04 DIAGNOSIS — Z79899 Other long term (current) drug therapy: Secondary | ICD-10-CM | POA: Diagnosis not present

## 2016-03-04 DIAGNOSIS — Z79891 Long term (current) use of opiate analgesic: Secondary | ICD-10-CM | POA: Diagnosis not present

## 2016-03-04 DIAGNOSIS — G894 Chronic pain syndrome: Secondary | ICD-10-CM | POA: Diagnosis not present

## 2016-03-04 DIAGNOSIS — M961 Postlaminectomy syndrome, not elsewhere classified: Secondary | ICD-10-CM | POA: Diagnosis not present

## 2016-03-07 DIAGNOSIS — M546 Pain in thoracic spine: Secondary | ICD-10-CM | POA: Diagnosis not present

## 2016-03-07 DIAGNOSIS — M549 Dorsalgia, unspecified: Secondary | ICD-10-CM | POA: Diagnosis not present

## 2016-03-07 DIAGNOSIS — M5440 Lumbago with sciatica, unspecified side: Secondary | ICD-10-CM | POA: Diagnosis not present

## 2016-03-07 DIAGNOSIS — Z6841 Body Mass Index (BMI) 40.0 and over, adult: Secondary | ICD-10-CM | POA: Diagnosis not present

## 2016-03-07 DIAGNOSIS — M47816 Spondylosis without myelopathy or radiculopathy, lumbar region: Secondary | ICD-10-CM | POA: Diagnosis not present

## 2016-03-07 DIAGNOSIS — M5136 Other intervertebral disc degeneration, lumbar region: Secondary | ICD-10-CM | POA: Diagnosis not present

## 2016-03-10 ENCOUNTER — Ambulatory Visit (INDEPENDENT_AMBULATORY_CARE_PROVIDER_SITE_OTHER): Payer: Medicare Other | Admitting: Family Medicine

## 2016-03-10 ENCOUNTER — Encounter (INDEPENDENT_AMBULATORY_CARE_PROVIDER_SITE_OTHER): Payer: Self-pay

## 2016-03-10 VITALS — BP 134/95 | HR 98 | Temp 97.3°F | Ht 64.0 in | Wt 267.8 lb

## 2016-03-10 DIAGNOSIS — J9601 Acute respiratory failure with hypoxia: Secondary | ICD-10-CM

## 2016-03-10 DIAGNOSIS — M7501 Adhesive capsulitis of right shoulder: Secondary | ICD-10-CM

## 2016-03-10 DIAGNOSIS — M150 Primary generalized (osteo)arthritis: Secondary | ICD-10-CM | POA: Diagnosis not present

## 2016-03-10 DIAGNOSIS — M159 Polyosteoarthritis, unspecified: Secondary | ICD-10-CM

## 2016-03-10 DIAGNOSIS — R0600 Dyspnea, unspecified: Secondary | ICD-10-CM

## 2016-03-10 DIAGNOSIS — N393 Stress incontinence (female) (male): Secondary | ICD-10-CM

## 2016-03-10 DIAGNOSIS — S62102A Fracture of unspecified carpal bone, left wrist, initial encounter for closed fracture: Secondary | ICD-10-CM

## 2016-03-10 DIAGNOSIS — I480 Paroxysmal atrial fibrillation: Secondary | ICD-10-CM | POA: Diagnosis not present

## 2016-03-10 DIAGNOSIS — R0609 Other forms of dyspnea: Secondary | ICD-10-CM | POA: Diagnosis not present

## 2016-03-10 DIAGNOSIS — J439 Emphysema, unspecified: Secondary | ICD-10-CM | POA: Diagnosis not present

## 2016-03-10 DIAGNOSIS — M8949 Other hypertrophic osteoarthropathy, multiple sites: Secondary | ICD-10-CM

## 2016-03-10 MED ORDER — ONDANSETRON 8 MG PO TBDP: 8.0000 mg | ORAL_TABLET | Freq: Three times a day (TID) | ORAL | 5 refills | Status: DC | PRN

## 2016-03-10 NOTE — Progress Notes (Addendum)
Subjective:  Patient ID: Shirley Leblanc, female    DOB: 05/17/1946  Age: 70 y.o. MRN: [id number]  CC: power chair evaluation   HPI CHLOIE RASO presents for Evaluation for power wheelchair. Pt. Has a history of frequent falls. Most recently she fell at home and suffered a colles fracture. She has chronic total body weakness Her upper body strength is very poor. She has a dyspnea that contributes to the weakness. Her diagnoses contributing to these symptoms include atrial fibrillation, Oxygen dependant COPD. Severe osteoarthritis and obesity. Medication for these conditions is as listed below Symptoms have been gradually increasing for ten years, but have accelerated over the last several months. She has fallen 5 times in the last two weeks  She can walk about 10 feet before becoming too weak to continue. Ambulation is very slow. This is only possible with the assistance of a walker. Unfortunately several of her falls have occurred while using the walker. This is secondary to her poor balance and extreme weakness. The recent wrist fracture and accelerated pace of falls mandate the need for a power wheelchair to prevent further more serious injury.  She can not operate a manual wheelchair due to her lack of upper body strength and decreased range of motion broughton by arthritic changes of the shoulders. She also has a frozen left shoulder.   She can not operate a scooter due to poor balance, chronicback pain.  The home setting is satisfactory for use of a power device. She can not move from bedroom to bathroom, to kitchen to perform ADLs  Without the power device since they are separaed by a distance greater than that she can safely negotiate.     History Joelly has a past medical history of Bursitis; Depression; DJD (degenerative joint disease); GERD (gastroesophageal reflux disease); Hyperlipemia; Hypertension; Obesities, morbid ([place]); and Urinary incontinence.   She has a past surgical  history that includes Neck surgery; Cardiac catheterization; Total knee arthroplasty; Vaginal hysterectomy; and Cholecystectomy (N/A, 05/14/2015).   Her family history includes Heart attack in her father; Lung disease in her mother.She reports that she has never smoked. She does not have any smokeless tobacco history on file. She reports that she does not drink alcohol or use drugs.    Medication List       Accurate as of 03/10/16 11:59 PM. Always use your most recent med list.          aspirin 81 MG EC tablet Take 81 mg by mouth daily.   atorvastatin 10 MG tablet Commonly known as:  LIPITOR Take 10 mg by mouth daily.   clonazePAM 0.5 MG tablet Commonly known as:  KLONOPIN Take 1 tablet (0.5 mg total) by mouth 2 (two) times daily as needed for anxiety.   DULoxetine 30 MG capsule Commonly known as:  CYMBALTA Take 1 capsule (30 mg total) by mouth daily. For one week then two daily. Take with a full stomach at suppertime   furosemide 20 MG tablet Commonly known as:  LASIX Take 1 tablet (20 mg total) by mouth daily.   HYDROcodone-acetaminophen 7.5-325 MG tablet Commonly known as:  NORCO Take 1 tablet by mouth every 6 (six) hours as needed for moderate pain.   levothyroxine 50 MCG tablet Commonly known as:  SYNTHROID, LEVOTHROID Take 50 mcg by mouth daily.   meclizine 25 MG tablet Commonly known as:  ANTIVERT Take 1 tablet (25 mg total) by mouth 3 (three) times daily.   ondansetron 8 MG disintegrating  tablet Commonly known as:  ZOFRAN ODT Take 1 tablet (8 mg total) by mouth every 8 (eight) hours as needed for nausea or vomiting.   quinapril 40 MG tablet Commonly known as:  ACCUPRIL Take 40 mg by mouth daily.   tiZANidine 2 MG tablet Commonly known as:  ZANAFLEX Take 2 mg by mouth 2 (two) times daily.   Vitamin D3 2000 units Tabs Take 2,000 Units by mouth daily.        ROS Review of Systems  Constitutional: Positive for activity change and fatigue. Negative  for appetite change and fever.  HENT: Negative for congestion, rhinorrhea and sore throat.   Eyes: Negative for visual disturbance.  Respiratory: Positive for shortness of breath. Negative for cough.   Cardiovascular: Negative for chest pain and palpitations.  Gastrointestinal: Negative for abdominal pain, diarrhea and nausea.  Genitourinary: Negative for dysuria.  Musculoskeletal: Positive for arthralgias, back pain, gait problem, myalgias and neck pain.  Neurological: Positive for weakness and light-headedness.    Objective:  BP (!) 134/95 (BP Location: Right Wrist, Patient Position: Sitting, Cuff Size: Normal)   Pulse 98   Temp 97.3 F (36.3 C) (Oral)   Ht 5\' 4"  (1.626 m)   Wt 267 lb 12.8 oz (121.5 kg)   SpO2 94%   BMI 45.97 kg/m   BP Readings from Last 3 Encounters:  03/10/16 (!) 134/95  02/22/16 111/74  01/25/16 159/89    Wt Readings from Last 3 Encounters:  03/10/16 267 lb 12.8 oz (121.5 kg)  02/22/16 265 lb (120.2 kg)  10/23/15 243 lb (110.2 kg)     Physical Exam  Constitutional: She is oriented to person, place, and time. She appears well-developed and well-nourished. No distress.  HENT:  Head: Normocephalic and atraumatic.  Eyes: Conjunctivae and EOM are normal. Pupils are equal, round, and reactive to light.  Neck: Normal range of motion. Neck supple. No thyromegaly present.  Cardiovascular: Normal rate, regular rhythm and normal heart sounds.   No murmur heard. Pulmonary/Chest: Effort normal and breath sounds normal. No respiratory distress. She has no wheezes. She has no rales.  Abdominal: Soft. Bowel sounds are normal. There is no tenderness.  Musculoskeletal:       Right shoulder: She exhibits decreased range of motion and tenderness.       Right wrist: She exhibits decreased range of motion.  Right wrist in cast.  Wheelchair bound. BLE weak. Gait unstable  Lymphadenopathy:    She has no cervical adenopathy.  Neurological: She is alert and oriented  to person, place, and time. She has normal reflexes.  Skin: Skin is warm and dry.  Psychiatric: She has a normal mood and affect. Her behavior is normal. Judgment and thought content normal.     Lab Results  Component Value Date   WBC 10.6 02/22/2016   HGB 14.1 01/25/2016   HCT 42.2 02/22/2016   PLT 277 02/22/2016   GLUCOSE 111 (H) 02/22/2016   CHOL 196 02/22/2016   TRIG 206 (H) 02/22/2016   HDL 49 02/22/2016   LDLCALC 106 (H) 02/22/2016   ALT 18 02/22/2016   AST 20 02/22/2016   NA 138 02/22/2016   K 3.9 02/22/2016   CL 94 (L) 02/22/2016   CREATININE 0.71 02/22/2016   BUN 19 02/22/2016   CO2 28 02/22/2016   TSH 4.290 02/22/2016   HGBA1C (H) 02/22/2008    6.2 (NOTE)   The ADA recommends the following therapeutic goal for glycemic   control related to Hgb A1C  measurement:   Goal of Therapy:   < 7.0% Hgb A1C   Reference: American Diabetes Association: Clinical Practice   Recommendations 2008, Diabetes Care,  2008, 31:(Suppl 1).    Dg Chest 2 View  Result Date: 01/25/2016 CLINICAL DATA:  Fall.  Initial encounter. EXAM: CHEST  2 VIEW COMPARISON:  05/15/2015 FINDINGS: Chronic cardiopericardial enlargement and aortic tortuosity. Low volume chest with interstitial crowding. There is no edema, consolidation, effusion, or pneumothorax. Dorsal column stimulators. Remote, healed proximal left humerus fracture. IMPRESSION: Low volume chest without acute finding. Chronic cardiomegaly. Electronically Signed   By: Monte Fantasia M.D.   On: 01/25/2016 17:08   Dg Forearm Left  Result Date: 01/25/2016 CLINICAL DATA:  Fall onto left arm. Left wrist pain. Initial encounter. EXAM: LEFT FOREARM - 2 VIEW COMPARISON:  None. FINDINGS: There is a distal radius fracture with dorsal impaction causing wrist tilting. Probable intra-articular extension seen in the lateral projection. No more proximal fracture detected. Osteopenia. First CMC osteoarthritis. IMPRESSION: Dorsally impacted distal radius fracture,  likely intra-articular. Recommend dedicated wrist series. Electronically Signed   By: Monte Fantasia M.D.   On: 01/25/2016 17:05   Dg Wrist 2 Views Left  Result Date: 01/25/2016 CLINICAL DATA:  Fall.  Pain. EXAM: LEFT WRIST - 2 VIEW COMPARISON:  No recent prior. FINDINGS: Posteriorly angulated displaced fracture of the distal left radius is noted. Displaced ulnar styloid fracture is present. Diffuse osteopenia and degenerative change. Degenerative change particularly prominent about the radiocarpal joint and first carpometacarpal joint. IMPRESSION: Posteriorly angulated displaced fracture of the distal radius. Displaced ulnar styloid fracture. Electronically Signed   By: Marcello Moores  Register   On: 01/25/2016 17:31    Assessment & Plan:   Brandolyn was seen today for power chair evaluation.  Diagnoses and all orders for this visit:  Female stress incontinence -     DME Other see comment  Paroxysmal atrial fibrillation ([facility])  Acute respiratory failure with hypoxia ([facility])  Dyspnea on exertion  Fracture of wrist, closed, left, initial encounter  Frozen shoulder syndrome, right  Primary osteoarthritis involving multiple joints  Pulmonary emphysema, unspecified emphysema type ([place])  Other orders -     ondansetron (ZOFRAN ODT) 8 MG disintegrating tablet; Take 1 tablet (8 mg total) by mouth every 8 (eight) hours as needed for nausea or vomiting.    I am having Ms. Tipping maintain her levothyroxine, aspirin, Vitamin D3, quinapril, atorvastatin, furosemide, meclizine, tiZANidine, DULoxetine, clonazePAM, HYDROcodone-acetaminophen, and ondansetron.  Meds ordered this encounter  Medications  . HYDROcodone-acetaminophen (NORCO) 7.5-325 MG tablet    Sig: Take 1 tablet by mouth every 6 (six) hours as needed for moderate pain.  Marland Kitchen ondansetron (ZOFRAN ODT) 8 MG disintegrating tablet    Sig: Take 1 tablet (8 mg total) by mouth every 8 (eight) hours as needed for nausea or vomiting.    Dispense:  90  tablet    Refill:  5     Follow-up: Return in about 6 weeks (around 04/21/2016).  Claretta Fraise, M.D.

## 2016-03-13 NOTE — Progress Notes (Signed)
PT evaluation addendum Late entry for 05/12/15 PT diagnosis   05/12/15 1100  PT Assessment  PT Therapy Diagnosis  Difficulty walking (muscle weakness, generalized)  PT Recommendation/Assessment Patient needs continued PT services  PT Problem List Decreased strength;Decreased range of motion;Decreased activity tolerance;Decreased balance;Decreased mobility;Decreased coordination;Decreased knowledge of use of DME;Cardiopulmonary status limiting activity;Obesity  Barriers to Discharge Decreased caregiver support  03/13/2016 [place], [place]

## 2016-03-16 ENCOUNTER — Encounter: Payer: Self-pay | Admitting: Family Medicine

## 2016-03-17 ENCOUNTER — Other Ambulatory Visit: Payer: Self-pay | Admitting: Family Medicine

## 2016-03-17 DIAGNOSIS — M150 Primary generalized (osteo)arthritis: Principal | ICD-10-CM

## 2016-03-17 DIAGNOSIS — M159 Polyosteoarthritis, unspecified: Secondary | ICD-10-CM

## 2016-03-17 DIAGNOSIS — M8949 Other hypertrophic osteoarthropathy, multiple sites: Secondary | ICD-10-CM

## 2016-03-19 NOTE — Progress Notes (Signed)
OT Eval addendum - late entry for 05/14/2016    05/15/15 1356  OT Assessment  OT Therapy Diagnosis  Generalized weakness;Acute pain (muscle weakness,generalized)  OT Recommendation/Assessment All further OT needs can be met in the next venue of care  OT Problem List Decreased strength;Obesity;Pain;Decreased knowledge of precautions;Decreased knowledge of use of DME or AE;Decreased activity tolerance  [facility], OTR/L  [phone] 03/19/2016

## 2016-03-21 ENCOUNTER — Other Ambulatory Visit: Payer: Self-pay | Admitting: Family Medicine

## 2016-04-02 DIAGNOSIS — M961 Postlaminectomy syndrome, not elsewhere classified: Secondary | ICD-10-CM | POA: Diagnosis not present

## 2016-04-02 DIAGNOSIS — G894 Chronic pain syndrome: Secondary | ICD-10-CM | POA: Diagnosis not present

## 2016-04-02 DIAGNOSIS — Z79899 Other long term (current) drug therapy: Secondary | ICD-10-CM | POA: Diagnosis not present

## 2016-04-02 DIAGNOSIS — M545 Low back pain: Secondary | ICD-10-CM | POA: Diagnosis not present

## 2016-04-02 DIAGNOSIS — M542 Cervicalgia: Secondary | ICD-10-CM | POA: Diagnosis not present

## 2016-04-02 DIAGNOSIS — Z79891 Long term (current) use of opiate analgesic: Secondary | ICD-10-CM | POA: Diagnosis not present

## 2016-04-16 ENCOUNTER — Emergency Department (HOSPITAL_COMMUNITY): Payer: Medicare Other

## 2016-04-16 ENCOUNTER — Encounter (HOSPITAL_COMMUNITY): Payer: Self-pay | Admitting: *Deleted

## 2016-04-16 ENCOUNTER — Emergency Department (HOSPITAL_COMMUNITY)
Admission: EM | Admit: 2016-04-16 | Discharge: 2016-04-16 | Disposition: A | Discharge status: Home / Self Care | Payer: Medicare Other | Attending: Emergency Medicine | Admitting: Emergency Medicine

## 2016-04-16 DIAGNOSIS — Z7982 Long term (current) use of aspirin: Secondary | ICD-10-CM | POA: Insufficient documentation

## 2016-04-16 DIAGNOSIS — R0902 Hypoxemia: Secondary | ICD-10-CM | POA: Insufficient documentation

## 2016-04-16 DIAGNOSIS — R05 Cough: Secondary | ICD-10-CM | POA: Diagnosis not present

## 2016-04-16 DIAGNOSIS — R531 Weakness: Secondary | ICD-10-CM

## 2016-04-16 DIAGNOSIS — Z79899 Other long term (current) drug therapy: Secondary | ICD-10-CM | POA: Insufficient documentation

## 2016-04-16 DIAGNOSIS — G8929 Other chronic pain: Secondary | ICD-10-CM | POA: Diagnosis not present

## 2016-04-16 DIAGNOSIS — E860 Dehydration: Secondary | ICD-10-CM | POA: Diagnosis not present

## 2016-04-16 DIAGNOSIS — B349 Viral infection, unspecified: Secondary | ICD-10-CM | POA: Diagnosis not present

## 2016-04-16 DIAGNOSIS — I10 Essential (primary) hypertension: Secondary | ICD-10-CM | POA: Insufficient documentation

## 2016-04-16 DIAGNOSIS — R1031 Right lower quadrant pain: Secondary | ICD-10-CM | POA: Diagnosis not present

## 2016-04-16 DIAGNOSIS — E876 Hypokalemia: Secondary | ICD-10-CM | POA: Diagnosis not present

## 2016-04-16 DIAGNOSIS — R404 Transient alteration of awareness: Secondary | ICD-10-CM | POA: Diagnosis not present

## 2016-04-16 DIAGNOSIS — M549 Dorsalgia, unspecified: Secondary | ICD-10-CM | POA: Insufficient documentation

## 2016-04-16 LAB — CBC WITH DIFFERENTIAL/PLATELET
BASOS PCT: 0 %
Basophils Absolute: 0 10*3/uL (ref 0.0–0.1)
EOS ABS: 0 10*3/uL (ref 0.0–0.7)
Eosinophils Relative: 0 %
HEMATOCRIT: 40.6 % (ref 36.0–46.0)
HEMOGLOBIN: 14 g/dL (ref 12.0–15.0)
LYMPHS ABS: 1.5 10*3/uL (ref 0.7–4.0)
Lymphocytes Relative: 9 %
MCH: 30.8 pg (ref 26.0–34.0)
MCHC: 34.5 g/dL (ref 30.0–36.0)
MCV: 89.4 fL (ref 78.0–100.0)
MONO ABS: 1.2 10*3/uL — AB (ref 0.1–1.0)
MONOS PCT: 7 %
NEUTROS PCT: 84 %
Neutro Abs: 14.4 10*3/uL — ABNORMAL HIGH (ref 1.7–7.7)
Platelets: 188 10*3/uL (ref 150–400)
RBC: 4.54 MIL/uL (ref 3.87–5.11)
RDW: 12.8 % (ref 11.5–15.5)
WBC: 16.5 10*3/uL — ABNORMAL HIGH (ref 4.0–10.5)

## 2016-04-16 LAB — COMPREHENSIVE METABOLIC PANEL
ALK PHOS: 96 U/L (ref 38–126)
ALT: 33 U/L (ref 14–54)
AST: 33 U/L (ref 15–41)
Albumin: 3.6 g/dL (ref 3.5–5.0)
Anion gap: 5 (ref 5–15)
BUN: 9 mg/dL (ref 6–20)
CHLORIDE: 96 mmol/L — AB (ref 101–111)
CO2: 31 mmol/L (ref 22–32)
CREATININE: 0.64 mg/dL (ref 0.44–1.00)
Calcium: 9.1 mg/dL (ref 8.9–10.3)
GFR calc Af Amer: >60 mL/min (ref >60)
Glucose, Bld: 165 mg/dL — ABNORMAL HIGH (ref 65–99)
Potassium: 3.2 mmol/L — ABNORMAL LOW (ref 3.5–5.1)
SODIUM: 132 mmol/L — AB (ref 135–145)
Total Bilirubin: 0.7 mg/dL (ref 0.3–1.2)
Total Protein: 7.3 g/dL (ref 6.5–8.1)

## 2016-04-16 LAB — URINE MICROSCOPIC-ADD ON: WBC, UA: NONE SEEN WBC/hpf (ref 0–5)

## 2016-04-16 LAB — URINALYSIS, ROUTINE W REFLEX MICROSCOPIC
BILIRUBIN URINE: NEGATIVE
GLUCOSE, UA: NEGATIVE mg/dL
KETONES UR: NEGATIVE mg/dL
Leukocytes, UA: NEGATIVE
Nitrite: NEGATIVE
PH: 7.5 (ref 5.0–8.0)
Protein, ur: 30 mg/dL — AB
SPECIFIC GRAVITY, URINE: 1.015 (ref 1.005–1.030)

## 2016-04-16 MED ORDER — ONDANSETRON HCL 4 MG/2ML IJ SOLN
4.0000 mg | Freq: Once | INTRAMUSCULAR | Status: AC
  Administered 2016-04-16: 4 mg via INTRAVENOUS
  Filled 2016-04-16: qty 2

## 2016-04-16 MED ORDER — HYDROCODONE-ACETAMINOPHEN 5-325 MG PO TABS
1.0000 | ORAL_TABLET | Freq: Once | ORAL | Status: AC
  Administered 2016-04-16: 1 via ORAL
  Filled 2016-04-16: qty 1

## 2016-04-16 MED ORDER — POTASSIUM CHLORIDE CRYS ER 20 MEQ PO TBCR
40.0000 meq | EXTENDED_RELEASE_TABLET | Freq: Once | ORAL | Status: AC
  Administered 2016-04-16: 40 meq via ORAL
  Filled 2016-04-16: qty 2

## 2016-04-16 MED ORDER — ONDANSETRON HCL 4 MG/2ML IJ SOLN: 4.0000 mg | Freq: Once | INTRAMUSCULAR | Status: DC

## 2016-04-16 MED ORDER — SODIUM CHLORIDE 0.9 % IV BOLUS (SEPSIS)
1000.0000 mL | Freq: Once | INTRAVENOUS | Status: AC
  Administered 2016-04-16: 1000 mL via INTRAVENOUS

## 2016-04-16 NOTE — ED Notes (Signed)
Lab at bedside

## 2016-04-16 NOTE — ED Provider Notes (Signed)
[facility] DEPT Provider Note   CSN: [id number] Arrival date & time: 04/16/16  0831  By signing my name below, I, Rayna Sexton, attest that this documentation has been prepared under the direction and in the presence of Ripley Fraise, MD. Electronically Signed: Rayna Sexton, ED Scribe. 04/16/16. 8:55 AM.   History   Chief Complaint Chief Complaint  Patient presents with  . Weakness    HPI HPI Comments: Shirley Leblanc is a 70 y.o. female who presents to the Emergency Department by ambulance complaining of moderate right sided sore throat x 1 day. Pt states yesterday she began experiencing her sore throat with associated increased urinary frequency, dysuria, HA, ear pain, fevers, chills, cough and n/v. Pt denies having recently been admitted to the hospital or any recent abx use. Pt used to wear home O2 but states "she quit wearing it". She denies abd pain, CP, acute back pain and diarrhea.   PCP: Dr. Claretta Fraise    The history is provided by the patient. No language interpreter was used.    Past Medical History:  Diagnosis Date  . Bursitis   . Depression   . DJD (degenerative joint disease)   . GERD (gastroesophageal reflux disease)   . Hyperlipemia   . Hypertension   . Obesities, morbid ([place])   . Urinary incontinence     Patient Active Problem List   Diagnosis Date Noted  . Thyroid activity decreased 02/22/2016  . Hyperlipidemia 02/22/2016  . Chronic anticoagulation - Eliquis, CHADS2VASC=3 06/05/2015  . Atrial fibrillation ([place]) 05/16/2015  . PAF (paroxysmal atrial fibrillation) ([place])   . Acute respiratory failure with hypoxia ([place])   . S/P laparoscopic cholecystectomy   . Essential hypertension 05/09/2015  . Dyspnea on exertion 03/14/2011    Past Surgical History:  Procedure Laterality Date  . CARDIAC CATHETERIZATION    . CHOLECYSTECTOMY N/A 05/14/2015   Procedure: LAPAROSCOPIC CHOLECYSTECTOMY;  Surgeon: Coralie Keens, MD;  Location: [facility];   Service: General;  Laterality: N/A;  . NECK SURGERY    . TOTAL KNEE ARTHROPLASTY     x2  . VAGINAL HYSTERECTOMY      OB History    No data available       Home Medications    Prior to Admission medications   Medication Sig Start Date End Date Taking? Authorizing Provider  aspirin 81 MG EC tablet Take 81 mg by mouth daily.      Historical Provider, MD  atorvastatin (LIPITOR) 10 MG tablet Take 10 mg by mouth daily.    Historical Provider, MD  Cholecalciferol (VITAMIN D3) 2000 UNITS TABS Take 2,000 Units by mouth daily.     Historical Provider, MD  clonazePAM (KLONOPIN) 0.5 MG tablet Take 1 tablet (0.5 mg total) by mouth 2 (two) times daily as needed for anxiety. 02/22/16   Claretta Fraise, MD  DULoxetine (CYMBALTA) 30 MG capsule TAKE 1 CAPSULE BY MOUTH DAILY FOR 1 WEEK THEN 2 CAPS DAILY. TAKE WITH A FULL STOMACH AT SUPPERTIME 03/21/16   Mary-Margaret Hassell Done, FNP  furosemide (LASIX) 20 MG tablet Take 1 tablet (20 mg total) by mouth daily. 05/17/15   Modena Jansky, MD  HYDROcodone-acetaminophen (NORCO) 7.5-325 MG tablet Take 1 tablet by mouth every 6 (six) hours as needed for moderate pain.    Historical Provider, MD  levothyroxine (SYNTHROID, LEVOTHROID) 50 MCG tablet Take 50 mcg by mouth daily.      Historical Provider, MD  meclizine (ANTIVERT) 25 MG tablet Take 1 tablet (25 mg total) by  mouth 3 (three) times daily. 09/13/15   Charlann Lange, PA-C  ondansetron (ZOFRAN ODT) 8 MG disintegrating tablet Take 1 tablet (8 mg total) by mouth every 8 (eight) hours as needed for nausea or vomiting. 03/10/16   Claretta Fraise, MD  quinapril (ACCUPRIL) 40 MG tablet Take 40 mg by mouth daily.     Historical Provider, MD  tiZANidine (ZANAFLEX) 2 MG tablet Take 2 mg by mouth 2 (two) times daily.    Historical Provider, MD    Family History Family History  Problem Relation Age of Onset  . Lung disease Mother   . Heart attack Father   . Lung disease    . Heart attack      Social History Social  History  Substance Use Topics  . Smoking status: Never Smoker  . Smokeless tobacco: Never Used  . Alcohol use No     Allergies   Contrast media [iodinated diagnostic agents]; Penicillins; Sulfa antibiotics; and Morphine and related   Review of Systems Review of Systems  Constitutional: Positive for chills, fatigue and fever.  HENT: Positive for ear pain and sore throat.   Respiratory: Positive for cough.   Cardiovascular: Negative for chest pain.  Gastrointestinal: Positive for nausea and vomiting. Negative for abdominal pain and diarrhea.  Genitourinary: Positive for dysuria and frequency. Negative for difficulty urinating.  Musculoskeletal: Positive for back pain (chronic).  Neurological: Positive for headaches.  All other systems reviewed and are negative.  Physical Exam Updated Vital Signs BP 153/97 (BP Location: Left Arm)   Pulse 105   Temp 98.4 F (36.9 C) (Oral)   Resp 16   Ht 5\' 6"  (1.676 m)   Wt 270 lb (122.5 kg)   SpO2 (S) 93% Comment: Pt 82% on RA.   BMI 43.58 kg/m   Physical Exam CONSTITUTIONAL: Well developed/well nourished HEAD: Normocephalic/atraumatic EYES: EOMI/PERRL ENMT: Mucous membranes moist, uvula midline, erythema and vesicles noted to posterior oropharynx, bilateral TMs clear and intact, nml phonation, no stridor NECK: supple no meningeal signs SPINE/BACK:entire spine nontender CV: S1/S2 noted, no murmurs/rubs/gallops noted LUNGS: Lungs are clear to auscultation bilaterally, no apparent distress ABDOMEN: soft, nontender, no rebound or guarding, bowel sounds noted throughout abdomen GU:no cva tenderness NEURO: Pt is awake/alert/appropriate, moves all extremitiesx4.  No facial droop.   EXTREMITIES: pulses normal/equal, full ROM SKIN: warm, color normal PSYCH: no abnormalities of mood noted, alert and oriented to situation  ED Treatments / Results  Labs (all labs ordered are listed, but only abnormal results are displayed) Labs Reviewed    COMPREHENSIVE METABOLIC PANEL - Abnormal; Notable for the following:       Result Value   Sodium 132 (*)    Potassium 3.2 (*)    Chloride 96 (*)    Glucose, Bld 165 (*)    All other components within normal limits  CBC WITH DIFFERENTIAL/PLATELET - Abnormal; Notable for the following:    WBC 16.5 (*)    Neutro Abs 14.4 (*)    Monocytes Absolute 1.2 (*)    All other components within normal limits  URINALYSIS, ROUTINE W REFLEX MICROSCOPIC (NOT AT [facility]) - Abnormal; Notable for the following:    Hgb urine dipstick TRACE (*)    Protein, ur 30 (*)    All other components within normal limits  URINE MICROSCOPIC-ADD ON - Abnormal; Notable for the following:    Squamous Epithelial / LPF 0-5 (*)    Bacteria, UA FEW (*)    All other components within normal limits  URINE CULTURE    EKG ED ECG REPORT   Date: 04/16/2016 0857am  Rate: 105  Rhythm: sinus tachycardia  QRS Axis: left  Intervals: normal  ST/T Wave abnormalities: nonspecific ST changes  Conduction Disutrbances:left bundle branch block  Narrative Interpretation:   Old EKG Reviewed: unchanged  I have personally reviewed the EKG tracing and agree with the computerized printout as noted.  Radiology Dg Chest 2 View  Result Date: 04/16/2016 CLINICAL DATA:  Productive cough. EXAM: CHEST  2 VIEW COMPARISON:  Radiographs of February 22, 2016. FINDINGS: The heart size and mediastinal contours are within normal limits. Both lungs are clear. No pneumothorax or pleural effusion is noted. Stable position of stimulator leads seen in mid thoracic spinal canal. The visualized skeletal structures are unremarkable. IMPRESSION: No active cardiopulmonary disease. Electronically Signed   By: Marijo Conception, M.D.   On: 04/16/2016 09:25    Procedures Procedures  DIAGNOSTIC STUDIES: Oxygen Saturation is 93% on [place] 2L/min, adequate by my interpretation.    COORDINATION OF CARE: 8:53 AM Discussed next steps with pt. Pt verbalized understanding and  is agreeable with the plan.    Medications Ordered in ED Medications  ondansetron (ZOFRAN) injection 4 mg (not administered)  ondansetron (ZOFRAN) injection 4 mg (4 mg Intravenous Given 04/16/16 0952)  sodium chloride 0.9 % bolus 1,000 mL (1,000 mLs Intravenous New Bag/Given 04/16/16 1016)  potassium chloride SA (K-DUR,KLOR-CON) CR tablet 40 mEq (40 mEq Oral Given 04/16/16 1016)  HYDROcodone-acetaminophen (NORCO/VICODIN) 5-325 MG per tablet 1 tablet (1 tablet Oral Given 04/16/16 1145)     Initial Impression / Assessment and Plan / ED Course  I have reviewed the triage vital signs and the nursing notes.  Pertinent labs & imaging results that were available during my care of the patient were reviewed by me and considered in my medical decision making (see chart for details).  Clinical Course    Pt stable She is awake/alert She is not septic appearing currently She reports cough/sore throat/vomiitng/ear pain Labs/imaging pending She denies any new back pain (has spinal stimulator in place)  10:10 AM Pt awake/alert Vitals appropriate Granddaughter at bedside, pt lives with her She reports patient has been confused at night and has urinary incontinence, which she suspects is when she has fever.  No new change in meds Currently pt awake/alert No new back pain No abd pain No new leg weakness She reports feeling urge to urinate Given cough/sore throat/vomiting suspect possible viral illness Will rehydrate and try PO challenge  12:50 PM Pt improved BP 145/71   Pulse 104   Temp 98.1 F (36.7 C) (Oral)   Resp 18   Ht 5\' 6"  (1.676 m)   Wt 122.5 kg   SpO2 93%   BMI 43.58 kg/m  She is able to stand and ambulate at baseline She has no new complaints She is not septic appearing She is taking PO She would like to go home Home health has been arranged by case management She will need to restart her home oxygen (pt stopped using this) and can be arranged with home health   I  personally performed the services described in this documentation, which was scribed in my presence. The recorded information has been reviewed and is accurate.      Final Clinical Impressions(s) / ED Diagnoses   Final diagnoses:  Weakness  Dehydration  Hypokalemia  Hypoxia  Viral illness    New Prescriptions New Prescriptions   No medications on file  Ripley Fraise, MD 04/16/16 1252

## 2016-04-16 NOTE — ED Notes (Signed)
Pt ambulated with no assist needed. Pt was mildly SOB. MD Wickline made aware.

## 2016-04-16 NOTE — Discharge Instructions (Signed)

## 2016-04-16 NOTE — Care Management (Signed)
CM consult received. Pt is from home, lives with her granddaughter. Pt complaining of weakness over past few weeks. Pt is ind with ADL's. She uses a cane and walker for ambulation. She plans to DC home from ED today. Granddaughter is at the bedside. Pt has used [facility] in the past EDP has ordered [facility] services and pt would like to use [facility] again. Romualdo Bolk, of [facility] is aware of referral and will obtain pt info from chart. Pt is aware that [facility] has 48 hrs to initiate services. No further CM needs identified at this time.

## 2016-04-16 NOTE — ED Triage Notes (Signed)
Pt comes in by EMS for weakness and chills lasting several days. Pt has painful urination the last day. Pt has n/v yesterday.   CBG 174. IV established and 4 mg Zofran given.

## 2016-04-17 LAB — URINE CULTURE

## 2016-04-18 ENCOUNTER — Telehealth: Payer: Self-pay | Admitting: Family Medicine

## 2016-04-18 ENCOUNTER — Ambulatory Visit (INDEPENDENT_AMBULATORY_CARE_PROVIDER_SITE_OTHER): Payer: Medicare Other | Admitting: Family Medicine

## 2016-04-18 ENCOUNTER — Encounter: Payer: Self-pay | Admitting: Family Medicine

## 2016-04-18 VITALS — BP 102/56 | HR 91 | Temp 97.0°F | Ht 66.0 in | Wt 256.0 lb

## 2016-04-18 DIAGNOSIS — M1990 Unspecified osteoarthritis, unspecified site: Secondary | ICD-10-CM | POA: Diagnosis not present

## 2016-04-18 DIAGNOSIS — M549 Dorsalgia, unspecified: Secondary | ICD-10-CM | POA: Diagnosis not present

## 2016-04-18 DIAGNOSIS — F329 Major depressive disorder, single episode, unspecified: Secondary | ICD-10-CM

## 2016-04-18 DIAGNOSIS — R0609 Other forms of dyspnea: Secondary | ICD-10-CM

## 2016-04-18 DIAGNOSIS — I10 Essential (primary) hypertension: Secondary | ICD-10-CM | POA: Diagnosis not present

## 2016-04-18 DIAGNOSIS — Z6841 Body Mass Index (BMI) 40.0 and over, adult: Secondary | ICD-10-CM | POA: Diagnosis not present

## 2016-04-18 DIAGNOSIS — G894 Chronic pain syndrome: Secondary | ICD-10-CM | POA: Diagnosis not present

## 2016-04-18 DIAGNOSIS — N390 Urinary tract infection, site not specified: Secondary | ICD-10-CM | POA: Diagnosis not present

## 2016-04-18 DIAGNOSIS — R531 Weakness: Secondary | ICD-10-CM | POA: Diagnosis not present

## 2016-04-18 DIAGNOSIS — E876 Hypokalemia: Secondary | ICD-10-CM | POA: Diagnosis not present

## 2016-04-18 DIAGNOSIS — R0902 Hypoxemia: Secondary | ICD-10-CM | POA: Diagnosis not present

## 2016-04-18 DIAGNOSIS — R0600 Dyspnea, unspecified: Secondary | ICD-10-CM | POA: Diagnosis not present

## 2016-04-18 DIAGNOSIS — Z7982 Long term (current) use of aspirin: Secondary | ICD-10-CM | POA: Diagnosis not present

## 2016-04-18 DIAGNOSIS — Z969 Presence of functional implant, unspecified: Secondary | ICD-10-CM | POA: Diagnosis not present

## 2016-04-18 DIAGNOSIS — I480 Paroxysmal atrial fibrillation: Secondary | ICD-10-CM

## 2016-04-18 DIAGNOSIS — Z7901 Long term (current) use of anticoagulants: Secondary | ICD-10-CM | POA: Diagnosis not present

## 2016-04-18 DIAGNOSIS — E785 Hyperlipidemia, unspecified: Secondary | ICD-10-CM | POA: Diagnosis not present

## 2016-04-18 DIAGNOSIS — F32A Depression, unspecified: Secondary | ICD-10-CM

## 2016-04-18 MED ORDER — DULOXETINE HCL 30 MG PO CPEP: 90.0000 mg | ORAL_CAPSULE | Freq: Every day | ORAL | 2 refills | Status: DC

## 2016-04-18 MED ORDER — LEVOFLOXACIN 500 MG PO TABS: 500.0000 mg | ORAL_TABLET | Freq: Every day | ORAL | 0 refills | Status: DC

## 2016-04-18 MED ORDER — ONDANSETRON 8 MG PO TBDP: 8.0000 mg | ORAL_TABLET | Freq: Three times a day (TID) | ORAL | 5 refills | Status: DC | PRN

## 2016-04-18 NOTE — Telephone Encounter (Signed)
We did that this morning

## 2016-04-18 NOTE — Progress Notes (Signed)
Subjective:  Patient ID: Shirley Leblanc, female    DOB: 1945/09/22  Age: 70 y.o. MRN: [id number]  CC: Hospitalization Follow-up (pt here today following up after going to the hospital for N/V, sore throat, diarhhea, and also has issues with her Cymbalta making her snappy, moody and angry.)   HPI [name] presents for coughing thick sputum for 4-5 days. Worse yesterday - getting thicker  Pt states yesterday she began experiencing her sore throat with associated increased urinary frequency, dysuria, HA, ear pain, fevers, chills, cough and n/v. Pt denies having recently been admitted to the hospital or any recent abx use. Pt used to wear home O2 but states "she quit wearing it". Now she admits to shortness of breath particularly at night. However, she is short of breath for all activities. She denies abd pain, CP, acute back pain and diarrhea.   History Marietherese has a past medical history of Bursitis; Depression; DJD (degenerative joint disease); GERD (gastroesophageal reflux disease); Hyperlipemia; Hypertension; Obesities, morbid ([place]); and Urinary incontinence.   She has a past surgical history that includes Neck surgery; Cardiac catheterization; Total knee arthroplasty; Vaginal hysterectomy; and Cholecystectomy (N/A, 05/14/2015).   Her family history includes Heart attack in her father; Lung disease in her mother.She reports that she has never smoked. She has never used smokeless tobacco. She reports that she does not drink alcohol or use drugs.    ROS Review of Systems  Constitutional: Positive for activity change (2 short of breath to do more than a short walk of perhaps 50 feet.). Negative for appetite change and fever.  HENT: Negative for congestion, rhinorrhea and sore throat.   Eyes: Negative for visual disturbance.  Respiratory: Positive for shortness of breath. Negative for cough.   Cardiovascular: Negative for chest pain and palpitations.  Gastrointestinal: Negative for abdominal  pain, diarrhea and nausea.  Genitourinary: Negative for dysuria.  Musculoskeletal: Negative for arthralgias and myalgias.  Neurological: Positive for weakness (nonfocal).    Objective:  BP (!) 102/56   Pulse 91   Temp 97 F (36.1 C) (Oral)   Ht 5\' 6"  (1.676 m)   Wt 256 lb (116.1 kg)   SpO2 96%   BMI 41.32 kg/m   BP Readings from Last 3 Encounters:  04/18/16 (!) 102/56  04/16/16 145/71  03/10/16 (!) 134/95    Wt Readings from Last 3 Encounters:  04/18/16 256 lb (116.1 kg)  04/16/16 270 lb (122.5 kg)  03/10/16 267 lb 12.8 oz (121.5 kg)     Physical Exam  Constitutional: She is oriented to person, place, and time. She appears well-developed and well-nourished. She appears distressed.  HENT:  Head: Normocephalic and atraumatic.  Right Ear: External ear normal.  Left Ear: External ear normal.  Nose: Nose normal.  Mouth/Throat: Oropharynx is clear and moist.  Eyes: Conjunctivae and EOM are normal. Pupils are equal, round, and reactive to light.  Neck: Normal range of motion. Neck supple. No thyromegaly present.  Cardiovascular: Normal rate, regular rhythm and normal heart sounds.   No murmur heard. Pulmonary/Chest: Effort normal. No respiratory distress. She has wheezes (breath sounds distant). She has no rales.  Abdominal: Soft. Bowel sounds are normal. She exhibits no distension. There is no tenderness.  Musculoskeletal: Normal range of motion.  Lymphadenopathy:    She has no cervical adenopathy.  Neurological: She is alert and oriented to person, place, and time. She has normal reflexes.  Skin: Skin is warm and dry. She is not diaphoretic.  Psychiatric: She  has a normal mood and affect. Her behavior is normal. Judgment and thought content normal.     Lab Results  Component Value Date   WBC 16.5 (H) 04/16/2016   HGB 14.0 04/16/2016   HCT 40.6 04/16/2016   PLT 188 04/16/2016   GLUCOSE 165 (H) 04/16/2016   CHOL 196 02/22/2016   TRIG 206 (H) 02/22/2016   HDL 49  02/22/2016   LDLCALC 106 (H) 02/22/2016   ALT 33 04/16/2016   AST 33 04/16/2016   NA 132 (L) 04/16/2016   K 3.2 (L) 04/16/2016   CL 96 (L) 04/16/2016   CREATININE 0.64 04/16/2016   BUN 9 04/16/2016   CO2 31 04/16/2016   TSH 4.290 02/22/2016   HGBA1C (H) 02/22/2008    6.2 (NOTE)   The ADA recommends the following therapeutic goal for glycemic   control related to Hgb A1C measurement:   Goal of Therapy:   < 7.0% Hgb A1C   Reference: American Diabetes Association: Clinical Practice   Recommendations 2008, Diabetes Care,  2008, 31:(Suppl 1).    Dg Chest 2 View  Result Date: 04/16/2016 CLINICAL DATA:  Productive cough. EXAM: CHEST  2 VIEW COMPARISON:  Radiographs of February 22, 2016. FINDINGS: The heart size and mediastinal contours are within normal limits. Both lungs are clear. No pneumothorax or pleural effusion is noted. Stable position of stimulator leads seen in mid thoracic spinal canal. The visualized skeletal structures are unremarkable. IMPRESSION: No active cardiopulmonary disease. Electronically Signed   By: Marijo Conception, M.D.   On: 04/16/2016 09:25    Assessment & Plan:   Sheli was seen today for hospitalization follow-up.  Diagnoses and all orders for this visit:  Dyspnea  Paroxysmal atrial fibrillation (HCC)  Dyspnea on exertion  Essential hypertension  Chronic anticoagulation - Eliquis, CHADS2VASC=3  Depression  Other orders -     ondansetron (ZOFRAN ODT) 8 MG disintegrating tablet; Take 1 tablet (8 mg total) by mouth every 8 (eight) hours as needed for nausea or vomiting. -     DULoxetine (CYMBALTA) 30 MG capsule; Take 3 capsules (90 mg total) by mouth daily. -     levofloxacin (LEVAQUIN) 500 MG tablet; Take 1 tablet (500 mg total) by mouth daily.      I have changed Ms. Don's DULoxetine. I am also having her start on levofloxacin. Additionally, I am having her maintain her levothyroxine, aspirin, Vitamin D3, quinapril, atorvastatin, furosemide,  meclizine, tiZANidine, clonazePAM, HYDROcodone-acetaminophen, and ondansetron.  Meds ordered this encounter  Medications  . ondansetron (ZOFRAN ODT) 8 MG disintegrating tablet    Sig: Take 1 tablet (8 mg total) by mouth every 8 (eight) hours as needed for nausea or vomiting.    Dispense:  90 tablet    Refill:  5  . DULoxetine (CYMBALTA) 30 MG capsule    Sig: Take 3 capsules (90 mg total) by mouth daily.    Dispense:  90 capsule    Refill:  2  . levofloxacin (LEVAQUIN) 500 MG tablet    Sig: Take 1 tablet (500 mg total) by mouth daily.    Dispense:  7 tablet    Refill:  0     Follow-up: Return in about 1 month (around 05/18/2016).  Claretta Fraise, M.D.

## 2016-04-18 NOTE — Telephone Encounter (Signed)
Please address

## 2016-04-18 NOTE — Telephone Encounter (Signed)
Please order O2 2 liters [employer] qhs

## 2016-04-21 DIAGNOSIS — N390 Urinary tract infection, site not specified: Secondary | ICD-10-CM | POA: Diagnosis not present

## 2016-04-21 DIAGNOSIS — I10 Essential (primary) hypertension: Secondary | ICD-10-CM | POA: Diagnosis not present

## 2016-04-21 DIAGNOSIS — I480 Paroxysmal atrial fibrillation: Secondary | ICD-10-CM | POA: Diagnosis not present

## 2016-04-21 DIAGNOSIS — E876 Hypokalemia: Secondary | ICD-10-CM | POA: Diagnosis not present

## 2016-04-21 DIAGNOSIS — R531 Weakness: Secondary | ICD-10-CM | POA: Diagnosis not present

## 2016-04-21 DIAGNOSIS — R0902 Hypoxemia: Secondary | ICD-10-CM | POA: Diagnosis not present

## 2016-04-23 DIAGNOSIS — I480 Paroxysmal atrial fibrillation: Secondary | ICD-10-CM | POA: Diagnosis not present

## 2016-04-23 DIAGNOSIS — R531 Weakness: Secondary | ICD-10-CM | POA: Diagnosis not present

## 2016-04-23 DIAGNOSIS — E876 Hypokalemia: Secondary | ICD-10-CM | POA: Diagnosis not present

## 2016-04-23 DIAGNOSIS — N390 Urinary tract infection, site not specified: Secondary | ICD-10-CM | POA: Diagnosis not present

## 2016-04-23 DIAGNOSIS — R0902 Hypoxemia: Secondary | ICD-10-CM | POA: Diagnosis not present

## 2016-04-23 DIAGNOSIS — I10 Essential (primary) hypertension: Secondary | ICD-10-CM | POA: Diagnosis not present

## 2016-04-25 DIAGNOSIS — R531 Weakness: Secondary | ICD-10-CM | POA: Diagnosis not present

## 2016-04-25 DIAGNOSIS — N390 Urinary tract infection, site not specified: Secondary | ICD-10-CM | POA: Diagnosis not present

## 2016-04-25 DIAGNOSIS — I480 Paroxysmal atrial fibrillation: Secondary | ICD-10-CM | POA: Diagnosis not present

## 2016-04-25 DIAGNOSIS — E876 Hypokalemia: Secondary | ICD-10-CM | POA: Diagnosis not present

## 2016-04-25 DIAGNOSIS — R0902 Hypoxemia: Secondary | ICD-10-CM | POA: Diagnosis not present

## 2016-04-25 DIAGNOSIS — I10 Essential (primary) hypertension: Secondary | ICD-10-CM | POA: Diagnosis not present

## 2016-04-28 DIAGNOSIS — N390 Urinary tract infection, site not specified: Secondary | ICD-10-CM | POA: Diagnosis not present

## 2016-04-28 DIAGNOSIS — E876 Hypokalemia: Secondary | ICD-10-CM | POA: Diagnosis not present

## 2016-04-28 DIAGNOSIS — I10 Essential (primary) hypertension: Secondary | ICD-10-CM | POA: Diagnosis not present

## 2016-04-28 DIAGNOSIS — I480 Paroxysmal atrial fibrillation: Secondary | ICD-10-CM | POA: Diagnosis not present

## 2016-04-28 DIAGNOSIS — R531 Weakness: Secondary | ICD-10-CM | POA: Diagnosis not present

## 2016-04-28 DIAGNOSIS — R0902 Hypoxemia: Secondary | ICD-10-CM | POA: Diagnosis not present

## 2016-04-30 DIAGNOSIS — Z79899 Other long term (current) drug therapy: Secondary | ICD-10-CM | POA: Diagnosis not present

## 2016-04-30 DIAGNOSIS — Z4542 Encounter for adjustment and management of neuropacemaker (brain) (peripheral nerve) (spinal cord): Secondary | ICD-10-CM | POA: Diagnosis not present

## 2016-04-30 DIAGNOSIS — G894 Chronic pain syndrome: Secondary | ICD-10-CM | POA: Diagnosis not present

## 2016-04-30 DIAGNOSIS — M961 Postlaminectomy syndrome, not elsewhere classified: Secondary | ICD-10-CM | POA: Diagnosis not present

## 2016-04-30 DIAGNOSIS — Z79891 Long term (current) use of opiate analgesic: Secondary | ICD-10-CM | POA: Diagnosis not present

## 2016-05-01 DIAGNOSIS — R0902 Hypoxemia: Secondary | ICD-10-CM | POA: Diagnosis not present

## 2016-05-01 DIAGNOSIS — N390 Urinary tract infection, site not specified: Secondary | ICD-10-CM | POA: Diagnosis not present

## 2016-05-01 DIAGNOSIS — E876 Hypokalemia: Secondary | ICD-10-CM | POA: Diagnosis not present

## 2016-05-01 DIAGNOSIS — R531 Weakness: Secondary | ICD-10-CM | POA: Diagnosis not present

## 2016-05-01 DIAGNOSIS — I10 Essential (primary) hypertension: Secondary | ICD-10-CM | POA: Diagnosis not present

## 2016-05-01 DIAGNOSIS — I480 Paroxysmal atrial fibrillation: Secondary | ICD-10-CM | POA: Diagnosis not present

## 2016-05-06 ENCOUNTER — Telehealth: Payer: Self-pay | Admitting: Family Medicine

## 2016-05-06 ENCOUNTER — Encounter: Payer: Self-pay | Admitting: Family Medicine

## 2016-05-06 ENCOUNTER — Ambulatory Visit (INDEPENDENT_AMBULATORY_CARE_PROVIDER_SITE_OTHER): Payer: Medicare Other | Admitting: Family Medicine

## 2016-05-06 VITALS — BP 131/76 | HR 103 | Temp 97.1°F | Ht 66.0 in | Wt 262.2 lb

## 2016-05-06 DIAGNOSIS — R0902 Hypoxemia: Secondary | ICD-10-CM | POA: Diagnosis not present

## 2016-05-06 DIAGNOSIS — E876 Hypokalemia: Secondary | ICD-10-CM | POA: Diagnosis not present

## 2016-05-06 DIAGNOSIS — I10 Essential (primary) hypertension: Secondary | ICD-10-CM | POA: Diagnosis not present

## 2016-05-06 DIAGNOSIS — R0609 Other forms of dyspnea: Secondary | ICD-10-CM | POA: Diagnosis not present

## 2016-05-06 DIAGNOSIS — J439 Emphysema, unspecified: Secondary | ICD-10-CM | POA: Diagnosis not present

## 2016-05-06 DIAGNOSIS — R531 Weakness: Secondary | ICD-10-CM | POA: Diagnosis not present

## 2016-05-06 DIAGNOSIS — F332 Major depressive disorder, recurrent severe without psychotic features: Secondary | ICD-10-CM | POA: Diagnosis not present

## 2016-05-06 DIAGNOSIS — R0600 Dyspnea, unspecified: Secondary | ICD-10-CM

## 2016-05-06 DIAGNOSIS — I480 Paroxysmal atrial fibrillation: Secondary | ICD-10-CM | POA: Diagnosis not present

## 2016-05-06 DIAGNOSIS — N390 Urinary tract infection, site not specified: Secondary | ICD-10-CM | POA: Diagnosis not present

## 2016-05-06 MED ORDER — QUETIAPINE FUMARATE ER 50 MG PO TB24: 50.0000 mg | ORAL_TABLET | Freq: Every day | ORAL | 2 refills | Status: DC

## 2016-05-06 MED ORDER — DULOXETINE HCL 60 MG PO CPEP: 60.0000 mg | ORAL_CAPSULE | Freq: Two times a day (BID) | ORAL | 1 refills | Status: DC

## 2016-05-06 NOTE — Addendum Note (Signed)
Addended by: Claretta Fraise on: 05/06/2016 07:45 PM   Modules accepted: Orders

## 2016-05-06 NOTE — Telephone Encounter (Signed)
I sent in the requested prescription 

## 2016-05-06 NOTE — Progress Notes (Signed)
Subjective:  Patient ID: Shirley Leblanc, female    DOB: Jan 23, 1946  Age: 70 y.o. MRN: [id number]  CC: Depression (pt here today c/o depression and also c/o arm and leg weakness)   HPI ARASELI BAXTER presents for Continued severe depression. Of note is the pH Q below. She says all she can do is sit around and cry. Her kids just fussy about money when they get together. She is trying to sell her house and get them the money. She doesn't want the money she just wants them to come see her. She says she's never been so depressed before. PH Q9 noted below. Depression screen [facility] 2/9 05/06/2016 04/18/2016 03/10/2016  Decreased Interest 3 3 2   Down, Depressed, Hopeless 3 3 2   PHQ - 2 Score 6 6 4   Altered sleeping 3 2 2   Tired, decreased energy 3 3 2   Change in appetite 3 3 0  Feeling bad or failure about yourself  3 3 1   Trouble concentrating 3 2 0  Moving slowly or fidgety/restless 3 2 0  Suicidal thoughts 3 0 1  PHQ-9 Score 27 21 10   Difficult doing work/chores Very difficult Not difficult at all Not difficult at all     History Lenzy has a past medical history of Bursitis; Depression; DJD (degenerative joint disease); GERD (gastroesophageal reflux disease); Hyperlipemia; Hypertension; Obesities, morbid ([place]); and Urinary incontinence.   She has a past surgical history that includes Neck surgery; Cardiac catheterization; Total knee arthroplasty; Vaginal hysterectomy; and Cholecystectomy (N/A, 05/14/2015).   Her family history includes Heart attack in her father; Lung disease in her mother.She reports that she has never smoked. She has never used smokeless tobacco. She reports that she does not drink alcohol or use drugs.    ROS Review of Systems  Constitutional: Positive for activity change (2 short of breath to do more than a short walk of perhaps 50 feet.). Negative for appetite change and fever.  HENT: Negative for congestion, rhinorrhea and sore throat.   Eyes: Negative for visual  disturbance.  Respiratory: Positive for shortness of breath. Negative for cough.   Cardiovascular: Negative for chest pain and palpitations.  Gastrointestinal: Negative for abdominal pain, diarrhea and nausea.  Genitourinary: Negative for dysuria.  Musculoskeletal: Negative for arthralgias and myalgias.  Neurological: Positive for weakness (nonfocal).    Objective:  BP 131/76   Pulse (!) 103   Temp 97.1 F (36.2 C) (Oral)   Ht 5\' 6"  (1.676 m)   Wt 262 lb 4 oz (119 kg)   BMI 42.33 kg/m   BP Readings from Last 3 Encounters:  05/06/16 131/76  04/18/16 (!) 102/56  04/16/16 145/71    Wt Readings from Last 3 Encounters:  05/06/16 262 lb 4 oz (119 kg)  04/18/16 256 lb (116.1 kg)  04/16/16 270 lb (122.5 kg)     Physical Exam  Constitutional: She is oriented to person, place, and time. She appears well-developed and well-nourished. She appears distressed.  HENT:  Head: Normocephalic and atraumatic.  Right Ear: External ear normal.  Left Ear: External ear normal.  Nose: Nose normal.  Mouth/Throat: Oropharynx is clear and moist.  Eyes: Conjunctivae and EOM are normal. Pupils are equal, round, and reactive to light.  Neck: Normal range of motion. Neck supple. No thyromegaly present.  Cardiovascular: Normal rate, regular rhythm and normal heart sounds.   No murmur heard. Pulmonary/Chest: Effort normal. No respiratory distress. She has wheezes (breath sounds distant). She has no rales.  Abdominal:  Soft. Bowel sounds are normal. She exhibits no distension. There is no tenderness.  Musculoskeletal: Normal range of motion.  Lymphadenopathy:    She has no cervical adenopathy.  Neurological: She is alert and oriented to person, place, and time. She has normal reflexes.  Skin: Skin is warm and dry. She is not diaphoretic.  Psychiatric: Her mood appears anxious. Her speech is rapid and/or pressured. She is agitated. She exhibits a depressed mood.  Tearful      Lab Results    Component Value Date   WBC 16.5 (H) 04/16/2016   HGB 14.0 04/16/2016   HCT 40.6 04/16/2016   PLT 188 04/16/2016   GLUCOSE 165 (H) 04/16/2016   CHOL 196 02/22/2016   TRIG 206 (H) 02/22/2016   HDL 49 02/22/2016   LDLCALC 106 (H) 02/22/2016   ALT 33 04/16/2016   AST 33 04/16/2016   NA 132 (L) 04/16/2016   K 3.2 (L) 04/16/2016   CL 96 (L) 04/16/2016   CREATININE 0.64 04/16/2016   BUN 9 04/16/2016   CO2 31 04/16/2016   TSH 4.290 02/22/2016   HGBA1C (H) 02/22/2008    6.2 (NOTE)   The ADA recommends the following therapeutic goal for glycemic   control related to Hgb A1C measurement:   Goal of Therapy:   < 7.0% Hgb A1C   Reference: American Diabetes Association: Clinical Practice   Recommendations 2008, Diabetes Care,  2008, 31:(Suppl 1).    Dg Chest 2 View  Result Date: 04/16/2016 CLINICAL DATA:  Productive cough. EXAM: CHEST  2 VIEW COMPARISON:  Radiographs of February 22, 2016. FINDINGS: The heart size and mediastinal contours are within normal limits. Both lungs are clear. No pneumothorax or pleural effusion is noted. Stable position of stimulator leads seen in mid thoracic spinal canal. The visualized skeletal structures are unremarkable. IMPRESSION: No active cardiopulmonary disease. Electronically Signed   By: Marijo Conception, M.D.   On: 04/16/2016 09:25    Assessment & Plan:   Dalaney was seen today for depression.  Diagnoses and all orders for this visit:  Dyspnea on exertion -     For home use only DME oxygen  Pulmonary emphysema, unspecified emphysema type ([place]) -     For home use only DME oxygen  Severe episode of recurrent major depressive disorder, without psychotic features ([place])  Other orders -     DULoxetine (CYMBALTA) 60 MG capsule; Take 1 capsule (60 mg total) by mouth 2 (two) times daily. -     QUEtiapine (SEROQUEL XR) 50 MG TB24 24 hr tablet; Take 1 tablet (50 mg total) by mouth at bedtime.    Needs to have oxygen saturation measurements made before her home  oxygen can be dispensed.  I have discontinued Ms. Silverstein's tiZANidine and levofloxacin. I have also changed her DULoxetine. Additionally, I am having her start on QUEtiapine. Lastly, I am having her maintain her levothyroxine, aspirin, Vitamin D3, quinapril, atorvastatin, furosemide, meclizine, clonazePAM, HYDROcodone-acetaminophen, and ondansetron.  Meds ordered this encounter  Medications  . DULoxetine (CYMBALTA) 60 MG capsule    Sig: Take 1 capsule (60 mg total) by mouth 2 (two) times daily.    Dispense:  60 capsule    Refill:  1  . QUEtiapine (SEROQUEL XR) 50 MG TB24 24 hr tablet    Sig: Take 1 tablet (50 mg total) by mouth at bedtime.    Dispense:  30 tablet    Refill:  2     Follow-up: Return in about 2 weeks (  around 05/20/2016).  Claretta Fraise, M.D.

## 2016-05-07 NOTE — Telephone Encounter (Signed)
Patient aware, script is ready. 

## 2016-05-08 ENCOUNTER — Telehealth: Payer: Self-pay | Admitting: Family Medicine

## 2016-05-09 NOTE — Telephone Encounter (Signed)
Please advise and route to Pool B 

## 2016-05-11 ENCOUNTER — Other Ambulatory Visit: Payer: Self-pay | Admitting: Family Medicine

## 2016-05-11 MED ORDER — RISPERIDONE 1 MG PO TABS: 1.0000 mg | ORAL_TABLET | Freq: Every day | ORAL | 2 refills | Status: DC

## 2016-05-11 NOTE — Telephone Encounter (Signed)
I sent in the requested prescription 

## 2016-05-14 DIAGNOSIS — R0902 Hypoxemia: Secondary | ICD-10-CM | POA: Diagnosis not present

## 2016-05-14 DIAGNOSIS — N390 Urinary tract infection, site not specified: Secondary | ICD-10-CM | POA: Diagnosis not present

## 2016-05-14 DIAGNOSIS — R531 Weakness: Secondary | ICD-10-CM | POA: Diagnosis not present

## 2016-05-14 DIAGNOSIS — I10 Essential (primary) hypertension: Secondary | ICD-10-CM | POA: Diagnosis not present

## 2016-05-14 DIAGNOSIS — E876 Hypokalemia: Secondary | ICD-10-CM | POA: Diagnosis not present

## 2016-05-14 DIAGNOSIS — I480 Paroxysmal atrial fibrillation: Secondary | ICD-10-CM | POA: Diagnosis not present

## 2016-05-27 ENCOUNTER — Ambulatory Visit (INDEPENDENT_AMBULATORY_CARE_PROVIDER_SITE_OTHER): Payer: Medicare Other | Admitting: Family Medicine

## 2016-05-27 DIAGNOSIS — E876 Hypokalemia: Secondary | ICD-10-CM

## 2016-05-27 DIAGNOSIS — R531 Weakness: Secondary | ICD-10-CM | POA: Diagnosis not present

## 2016-05-27 DIAGNOSIS — N390 Urinary tract infection, site not specified: Secondary | ICD-10-CM

## 2016-05-27 DIAGNOSIS — R0902 Hypoxemia: Secondary | ICD-10-CM

## 2016-05-28 DIAGNOSIS — G894 Chronic pain syndrome: Secondary | ICD-10-CM | POA: Diagnosis not present

## 2016-05-28 DIAGNOSIS — M961 Postlaminectomy syndrome, not elsewhere classified: Secondary | ICD-10-CM | POA: Diagnosis not present

## 2016-05-28 DIAGNOSIS — Z79899 Other long term (current) drug therapy: Secondary | ICD-10-CM | POA: Diagnosis not present

## 2016-05-28 DIAGNOSIS — M542 Cervicalgia: Secondary | ICD-10-CM | POA: Diagnosis not present

## 2016-05-28 DIAGNOSIS — Z4542 Encounter for adjustment and management of neuropacemaker (brain) (peripheral nerve) (spinal cord): Secondary | ICD-10-CM | POA: Diagnosis not present

## 2016-05-28 DIAGNOSIS — Z79891 Long term (current) use of opiate analgesic: Secondary | ICD-10-CM | POA: Diagnosis not present

## 2016-05-30 DIAGNOSIS — E876 Hypokalemia: Secondary | ICD-10-CM | POA: Diagnosis not present

## 2016-05-30 DIAGNOSIS — R0902 Hypoxemia: Secondary | ICD-10-CM | POA: Diagnosis not present

## 2016-05-30 DIAGNOSIS — I480 Paroxysmal atrial fibrillation: Secondary | ICD-10-CM | POA: Diagnosis not present

## 2016-05-30 DIAGNOSIS — N390 Urinary tract infection, site not specified: Secondary | ICD-10-CM | POA: Diagnosis not present

## 2016-05-30 DIAGNOSIS — I10 Essential (primary) hypertension: Secondary | ICD-10-CM | POA: Diagnosis not present

## 2016-05-30 DIAGNOSIS — R531 Weakness: Secondary | ICD-10-CM | POA: Diagnosis not present

## 2016-06-11 ENCOUNTER — Telehealth: Payer: Self-pay | Admitting: Family Medicine

## 2016-06-11 NOTE — Telephone Encounter (Signed)
Yes, form is on [facility] desk to be signed

## 2016-06-15 ENCOUNTER — Other Ambulatory Visit: Payer: Self-pay | Admitting: Nurse Practitioner

## 2016-06-25 DIAGNOSIS — Z79899 Other long term (current) drug therapy: Secondary | ICD-10-CM | POA: Diagnosis not present

## 2016-06-25 DIAGNOSIS — G894 Chronic pain syndrome: Secondary | ICD-10-CM | POA: Diagnosis not present

## 2016-06-25 DIAGNOSIS — M961 Postlaminectomy syndrome, not elsewhere classified: Secondary | ICD-10-CM | POA: Diagnosis not present

## 2016-06-25 DIAGNOSIS — Z79891 Long term (current) use of opiate analgesic: Secondary | ICD-10-CM | POA: Diagnosis not present

## 2016-06-30 ENCOUNTER — Other Ambulatory Visit: Payer: Self-pay | Admitting: Family Medicine

## 2016-07-14 ENCOUNTER — Other Ambulatory Visit: Payer: Self-pay | Admitting: Family Medicine

## 2016-07-17 ENCOUNTER — Telehealth: Payer: Self-pay | Admitting: Family Medicine

## 2016-07-18 NOTE — Telephone Encounter (Signed)
Offered an appointment today but patient doesn't have anyone to bring her today.   Cough has improved but she does has a mild cough that is productive at times. Denies fever, chills, SOB, or chest pain. She is using her albuterol nebulizer once daily and is taking Mucinex 24 hr in the morning.   Suggested she continue with Mucinex. Increase fluids to thin mucous. Avoid dairy. Can use nebulizer every 6 hours if needed. Cautioned that it may make her jittery.   Advised that our office will be closed tomorrow and Monday but that someone is always on call and available by phone if she needs assistance. She should call or seek care if she develops a fever, chills, SOB, or chest/back pain.   Patient was agreeable to plan.

## 2016-07-24 DIAGNOSIS — M545 Low back pain: Secondary | ICD-10-CM | POA: Diagnosis not present

## 2016-07-24 DIAGNOSIS — K5909 Other constipation: Secondary | ICD-10-CM | POA: Diagnosis not present

## 2016-07-24 DIAGNOSIS — Z79891 Long term (current) use of opiate analgesic: Secondary | ICD-10-CM | POA: Diagnosis not present

## 2016-07-24 DIAGNOSIS — M961 Postlaminectomy syndrome, not elsewhere classified: Secondary | ICD-10-CM | POA: Diagnosis not present

## 2016-07-24 DIAGNOSIS — G894 Chronic pain syndrome: Secondary | ICD-10-CM | POA: Diagnosis not present

## 2016-07-24 DIAGNOSIS — Z79899 Other long term (current) drug therapy: Secondary | ICD-10-CM | POA: Diagnosis not present

## 2016-08-12 ENCOUNTER — Other Ambulatory Visit: Payer: Self-pay | Admitting: Family Medicine

## 2016-08-25 DIAGNOSIS — G894 Chronic pain syndrome: Secondary | ICD-10-CM | POA: Diagnosis not present

## 2016-08-25 DIAGNOSIS — M545 Low back pain: Secondary | ICD-10-CM | POA: Diagnosis not present

## 2016-08-25 DIAGNOSIS — M961 Postlaminectomy syndrome, not elsewhere classified: Secondary | ICD-10-CM | POA: Diagnosis not present

## 2016-08-25 DIAGNOSIS — Z79891 Long term (current) use of opiate analgesic: Secondary | ICD-10-CM | POA: Diagnosis not present

## 2016-08-25 DIAGNOSIS — Z79899 Other long term (current) drug therapy: Secondary | ICD-10-CM | POA: Diagnosis not present

## 2016-08-30 ENCOUNTER — Other Ambulatory Visit: Payer: Self-pay | Admitting: Family Medicine

## 2016-09-11 ENCOUNTER — Other Ambulatory Visit: Payer: Self-pay | Admitting: Family Medicine

## 2016-09-17 DIAGNOSIS — Z79891 Long term (current) use of opiate analgesic: Secondary | ICD-10-CM | POA: Diagnosis not present

## 2016-09-17 DIAGNOSIS — G894 Chronic pain syndrome: Secondary | ICD-10-CM | POA: Diagnosis not present

## 2016-09-17 DIAGNOSIS — M545 Low back pain: Secondary | ICD-10-CM | POA: Diagnosis not present

## 2016-09-17 DIAGNOSIS — M961 Postlaminectomy syndrome, not elsewhere classified: Secondary | ICD-10-CM | POA: Diagnosis not present

## 2016-09-18 ENCOUNTER — Ambulatory Visit (INDEPENDENT_AMBULATORY_CARE_PROVIDER_SITE_OTHER): Payer: Medicare Other | Admitting: Family

## 2016-09-18 ENCOUNTER — Encounter: Payer: Self-pay | Admitting: Family

## 2016-09-18 VITALS — BP 129/71 | HR 130 | Temp 99.3°F | Ht 66.0 in | Wt 265.8 lb

## 2016-09-18 DIAGNOSIS — R309 Painful micturition, unspecified: Secondary | ICD-10-CM | POA: Diagnosis not present

## 2016-09-18 DIAGNOSIS — J101 Influenza due to other identified influenza virus with other respiratory manifestations: Secondary | ICD-10-CM

## 2016-09-18 DIAGNOSIS — R6889 Other general symptoms and signs: Secondary | ICD-10-CM | POA: Diagnosis not present

## 2016-09-18 LAB — URINALYSIS, COMPLETE
BILIRUBIN UA: NEGATIVE
GLUCOSE, UA: NEGATIVE
KETONES UA: NEGATIVE
Leukocytes, UA: NEGATIVE
NITRITE UA: NEGATIVE
PROTEIN UA: NEGATIVE
SPEC GRAV UA: 1.01 (ref 1.005–1.030)
UUROB: 0.2 mg/dL (ref 0.2–1.0)
pH, UA: 5.5 (ref 5.0–7.5)

## 2016-09-18 LAB — VERITOR FLU A/B WAIVED
INFLUENZA A: POSITIVE — AB
Influenza B: NEGATIVE

## 2016-09-18 LAB — MICROSCOPIC EXAMINATION
Bacteria, UA: NONE SEEN
Renal Epithel, UA: NONE SEEN /hpf

## 2016-09-18 MED ORDER — OSELTAMIVIR PHOSPHATE 75 MG PO CAPS: 75.0000 mg | ORAL_CAPSULE | Freq: Two times a day (BID) | ORAL | 0 refills | Status: DC

## 2016-09-18 NOTE — Progress Notes (Signed)
   Subjective:    Patient ID: Shirley Leblanc, female    DOB: 09-Dec-1945, 71 y.o.   MRN: [id number]  Fever   This is a new problem. The current episode started yesterday. The problem has been gradually worsening. The maximum temperature noted was 99 to 99.9 F. Associated symptoms include congestion, coughing, headaches, sleepiness, a sore throat, urinary pain and wheezing. Pertinent negatives include no ear pain. She has tried fluids for the symptoms. The treatment provided no relief.  Cough  Associated symptoms include a fever, headaches, a sore throat and wheezing. Pertinent negatives include no ear pain.  Headache   Associated symptoms include coughing, dizziness, a fever and a sore throat. Pertinent negatives include no ear pain.  Dizziness  Associated symptoms include congestion, coughing, a fever, headaches and a sore throat.  Dysuria   This is a new problem. The current episode started 1 to 4 weeks ago. The problem occurs every urination. The problem has been gradually worsening. The quality of the pain is described as burning. The pain is at a severity of 8/10. The pain is moderate. Associated symptoms include a discharge and frequency. Pertinent negatives include no hematuria. She has tried increased fluids for the symptoms. The treatment provided no relief.      Review of Systems  Constitutional: Positive for fever.  HENT: Positive for congestion and sore throat. Negative for ear pain.   Respiratory: Positive for cough and wheezing.   Genitourinary: Positive for dysuria and frequency. Negative for hematuria.  Neurological: Positive for dizziness and headaches.  All other systems reviewed and are negative.      Objective:   Physical Exam  Constitutional: She is oriented to person, place, and time. She appears well-developed and well-nourished. No distress.  HENT:  Head: Normocephalic and atraumatic.  Right Ear: External ear normal.  Left Ear: External ear normal.  Nose:  Mucosal edema and rhinorrhea present.  Mouth/Throat: Posterior oropharyngeal erythema present.  Eyes: Pupils are equal, round, and reactive to light.  Neck: Normal range of motion. Neck supple. No thyromegaly present.  Cardiovascular: Normal rate, regular rhythm, normal heart sounds and intact distal pulses.   No murmur heard. Pulmonary/Chest: Effort normal. No respiratory distress. She has wheezes.  Abdominal: Soft. Bowel sounds are normal. She exhibits no distension. There is no tenderness.  Musculoskeletal: Normal range of motion. She exhibits no edema or tenderness.  Neurological: She is alert and oriented to person, place, and time. She has normal reflexes. No cranial nerve deficit.  Skin: Skin is warm and dry.  Psychiatric: She has a normal mood and affect. Her behavior is normal. Judgment and thought content normal.  Vitals reviewed.    BP 129/71   Pulse (!) 130   Temp 99.3 F (37.4 C) (Oral)   Ht 5\' 6"  (1.676 m)   Wt 265 lb 12.8 oz (120.6 kg)   BMI 42.90 kg/m      Assessment & Plan:  1. Flu-like symptoms - Veritor Flu A/B Waived  2. Voiding pain -Urine negative - Urinalysis, Complete  3. Influenza A -Force fluids -Good hand hygiene discussed  -Tylenol prn for fever and pain - oseltamivir (TAMIFLU) 75 MG capsule; Take 1 capsule (75 mg total) by mouth 2 (two) times daily.  Dispense: 10 capsule; Refill: 0  Evelina Dun, FNP

## 2016-09-18 NOTE — Patient Instructions (Signed)

## 2016-09-19 ENCOUNTER — Emergency Department (HOSPITAL_COMMUNITY): Payer: Medicare Other

## 2016-09-19 ENCOUNTER — Encounter (HOSPITAL_COMMUNITY): Payer: Self-pay | Admitting: Pharmacy Technician

## 2016-09-19 ENCOUNTER — Inpatient Hospital Stay (HOSPITAL_COMMUNITY)
Admission: EM | Admit: 2016-09-19 | Discharge: 2016-09-22 | DRG: 871 | Disposition: A | Discharge status: Home / Self Care | Payer: Medicare Other | Attending: Internal Medicine | Admitting: Internal Medicine

## 2016-09-19 DIAGNOSIS — F329 Major depressive disorder, single episode, unspecified: Secondary | ICD-10-CM | POA: Diagnosis present

## 2016-09-19 DIAGNOSIS — E876 Hypokalemia: Secondary | ICD-10-CM | POA: Diagnosis present

## 2016-09-19 DIAGNOSIS — J189 Pneumonia, unspecified organism: Secondary | ICD-10-CM

## 2016-09-19 DIAGNOSIS — E785 Hyperlipidemia, unspecified: Secondary | ICD-10-CM | POA: Diagnosis present

## 2016-09-19 DIAGNOSIS — I110 Hypertensive heart disease with heart failure: Secondary | ICD-10-CM | POA: Diagnosis present

## 2016-09-19 DIAGNOSIS — Z885 Allergy status to narcotic agent status: Secondary | ICD-10-CM

## 2016-09-19 DIAGNOSIS — Z79899 Other long term (current) drug therapy: Secondary | ICD-10-CM

## 2016-09-19 DIAGNOSIS — Z66 Do not resuscitate: Secondary | ICD-10-CM | POA: Diagnosis present

## 2016-09-19 DIAGNOSIS — R061 Stridor: Secondary | ICD-10-CM | POA: Diagnosis not present

## 2016-09-19 DIAGNOSIS — A419 Sepsis, unspecified organism: Secondary | ICD-10-CM

## 2016-09-19 DIAGNOSIS — A4189 Other specified sepsis: Principal | ICD-10-CM | POA: Diagnosis present

## 2016-09-19 DIAGNOSIS — E1159 Type 2 diabetes mellitus with other circulatory complications: Secondary | ICD-10-CM | POA: Diagnosis present

## 2016-09-19 DIAGNOSIS — I509 Heart failure, unspecified: Secondary | ICD-10-CM | POA: Diagnosis present

## 2016-09-19 DIAGNOSIS — Z6841 Body Mass Index (BMI) 40.0 and over, adult: Secondary | ICD-10-CM

## 2016-09-19 DIAGNOSIS — R296 Repeated falls: Secondary | ICD-10-CM | POA: Diagnosis present

## 2016-09-19 DIAGNOSIS — I480 Paroxysmal atrial fibrillation: Secondary | ICD-10-CM | POA: Diagnosis present

## 2016-09-19 DIAGNOSIS — I152 Hypertension secondary to endocrine disorders: Secondary | ICD-10-CM | POA: Diagnosis present

## 2016-09-19 DIAGNOSIS — I4891 Unspecified atrial fibrillation: Secondary | ICD-10-CM | POA: Diagnosis present

## 2016-09-19 DIAGNOSIS — R404 Transient alteration of awareness: Secondary | ICD-10-CM | POA: Diagnosis not present

## 2016-09-19 DIAGNOSIS — I248 Other forms of acute ischemic heart disease: Secondary | ICD-10-CM | POA: Diagnosis present

## 2016-09-19 DIAGNOSIS — J111 Influenza due to unidentified influenza virus with other respiratory manifestations: Secondary | ICD-10-CM

## 2016-09-19 DIAGNOSIS — E119 Type 2 diabetes mellitus without complications: Secondary | ICD-10-CM | POA: Diagnosis present

## 2016-09-19 DIAGNOSIS — J09X1 Influenza due to identified novel influenza A virus with pneumonia: Secondary | ICD-10-CM | POA: Diagnosis present

## 2016-09-19 DIAGNOSIS — K219 Gastro-esophageal reflux disease without esophagitis: Secondary | ICD-10-CM | POA: Diagnosis present

## 2016-09-19 DIAGNOSIS — R0602 Shortness of breath: Secondary | ICD-10-CM

## 2016-09-19 DIAGNOSIS — J9601 Acute respiratory failure with hypoxia: Secondary | ICD-10-CM | POA: Diagnosis present

## 2016-09-19 DIAGNOSIS — J209 Acute bronchitis, unspecified: Secondary | ICD-10-CM | POA: Diagnosis present

## 2016-09-19 DIAGNOSIS — Z880 Allergy status to penicillin: Secondary | ICD-10-CM

## 2016-09-19 DIAGNOSIS — Z882 Allergy status to sulfonamides status: Secondary | ICD-10-CM

## 2016-09-19 DIAGNOSIS — I10 Essential (primary) hypertension: Secondary | ICD-10-CM | POA: Diagnosis not present

## 2016-09-19 DIAGNOSIS — Z23 Encounter for immunization: Secondary | ICD-10-CM | POA: Diagnosis not present

## 2016-09-19 DIAGNOSIS — J159 Unspecified bacterial pneumonia: Secondary | ICD-10-CM | POA: Diagnosis present

## 2016-09-19 DIAGNOSIS — M1990 Unspecified osteoarthritis, unspecified site: Secondary | ICD-10-CM | POA: Diagnosis present

## 2016-09-19 DIAGNOSIS — R062 Wheezing: Secondary | ICD-10-CM | POA: Diagnosis not present

## 2016-09-19 DIAGNOSIS — Z96659 Presence of unspecified artificial knee joint: Secondary | ICD-10-CM | POA: Diagnosis present

## 2016-09-19 DIAGNOSIS — E039 Hypothyroidism, unspecified: Secondary | ICD-10-CM | POA: Diagnosis present

## 2016-09-19 DIAGNOSIS — R531 Weakness: Secondary | ICD-10-CM | POA: Diagnosis not present

## 2016-09-19 DIAGNOSIS — G4733 Obstructive sleep apnea (adult) (pediatric): Secondary | ICD-10-CM | POA: Diagnosis present

## 2016-09-19 DIAGNOSIS — R072 Precordial pain: Secondary | ICD-10-CM | POA: Diagnosis not present

## 2016-09-19 DIAGNOSIS — Z7982 Long term (current) use of aspirin: Secondary | ICD-10-CM

## 2016-09-19 DIAGNOSIS — R0600 Dyspnea, unspecified: Secondary | ICD-10-CM | POA: Diagnosis not present

## 2016-09-19 DIAGNOSIS — J45909 Unspecified asthma, uncomplicated: Secondary | ICD-10-CM | POA: Diagnosis present

## 2016-09-19 DIAGNOSIS — R748 Abnormal levels of other serum enzymes: Secondary | ICD-10-CM | POA: Diagnosis not present

## 2016-09-19 DIAGNOSIS — R05 Cough: Secondary | ICD-10-CM | POA: Diagnosis not present

## 2016-09-19 DIAGNOSIS — Z91041 Radiographic dye allergy status: Secondary | ICD-10-CM

## 2016-09-19 DIAGNOSIS — I482 Chronic atrial fibrillation: Secondary | ICD-10-CM | POA: Diagnosis not present

## 2016-09-19 DIAGNOSIS — J101 Influenza due to other identified influenza virus with other respiratory manifestations: Secondary | ICD-10-CM

## 2016-09-19 LAB — COMPREHENSIVE METABOLIC PANEL
ALBUMIN: 3.4 g/dL — AB (ref 3.5–5.0)
ALT: 26 U/L (ref 14–54)
ANION GAP: 8 (ref 5–15)
AST: 35 U/L (ref 15–41)
Alkaline Phosphatase: 71 U/L (ref 38–126)
BUN: 9 mg/dL (ref 6–20)
CHLORIDE: 91 mmol/L — AB (ref 101–111)
CO2: 32 mmol/L (ref 22–32)
Calcium: 9.1 mg/dL (ref 8.9–10.3)
Creatinine, Ser: 0.73 mg/dL (ref 0.44–1.00)
GFR calc Af Amer: >60 mL/min (ref >60)
GFR calc non Af Amer: >60 mL/min (ref >60)
GLUCOSE: 195 mg/dL — AB (ref 65–99)
POTASSIUM: 2.8 mmol/L — AB (ref 3.5–5.1)
SODIUM: 131 mmol/L — AB (ref 135–145)
TOTAL PROTEIN: 7.1 g/dL (ref 6.5–8.1)
Total Bilirubin: 0.9 mg/dL (ref 0.3–1.2)

## 2016-09-19 LAB — STREP PNEUMONIAE URINARY ANTIGEN: Strep Pneumo Urinary Antigen: NEGATIVE

## 2016-09-19 LAB — GLUCOSE, CAPILLARY
GLUCOSE-CAPILLARY: 128 mg/dL — AB (ref 65–99)
GLUCOSE-CAPILLARY: 226 mg/dL — AB (ref 65–99)
Glucose-Capillary: 192 mg/dL — ABNORMAL HIGH (ref 65–99)

## 2016-09-19 LAB — EXPECTORATED SPUTUM ASSESSMENT W GRAM STAIN, RFLX TO RESP C

## 2016-09-19 LAB — I-STAT TROPONIN, ED: TROPONIN I, POC: 0.23 ng/mL — AB (ref 0.00–0.08)

## 2016-09-19 LAB — I-STAT CG4 LACTIC ACID, ED: LACTIC ACID, VENOUS: 1.55 mmol/L (ref 0.5–1.9)

## 2016-09-19 LAB — CBC WITH DIFFERENTIAL/PLATELET
Basophils Absolute: 0 10*3/uL (ref 0.0–0.1)
Basophils Relative: 0 %
Eosinophils Absolute: 0 10*3/uL (ref 0.0–0.7)
Eosinophils Relative: 0 %
HEMATOCRIT: 38.6 % (ref 36.0–46.0)
Hemoglobin: 12.8 g/dL (ref 12.0–15.0)
LYMPHS ABS: 0.7 10*3/uL (ref 0.7–4.0)
LYMPHS PCT: 6 %
MCH: 29.8 pg (ref 26.0–34.0)
MCHC: 33.2 g/dL (ref 30.0–36.0)
MCV: 90 fL (ref 78.0–100.0)
MONO ABS: 0.7 10*3/uL (ref 0.1–1.0)
MONOS PCT: 7 %
NEUTROS ABS: 9.3 10*3/uL — AB (ref 1.7–7.7)
Neutrophils Relative %: 87 %
Platelets: 169 10*3/uL (ref 150–400)
RBC: 4.29 MIL/uL (ref 3.87–5.11)
RDW: 13.2 % (ref 11.5–15.5)
WBC: 10.7 10*3/uL — ABNORMAL HIGH (ref 4.0–10.5)

## 2016-09-19 LAB — TROPONIN I
TROPONIN I: 0.24 ng/mL — AB (ref <0.03)
Troponin I: 0.4 ng/mL (ref <0.03)
Troponin I: 0.32 ng/mL (ref <0.03)
Troponin I: 0.19 ng/mL (ref <0.03)

## 2016-09-19 LAB — I-STAT ARTERIAL BLOOD GAS, ED
Acid-Base Excess: 10 mmol/L — ABNORMAL HIGH (ref 0.0–2.0)
BICARBONATE: 33.5 mmol/L — AB (ref 20.0–28.0)
O2 SAT: 99 %
PO2 ART: 107 mmHg (ref 83.0–108.0)
Patient temperature: 98.6
TCO2: 35 mmol/L (ref 0–100)
pCO2 arterial: 39.4 mmHg (ref 32.0–48.0)
pH, Arterial: 7.537 — ABNORMAL HIGH (ref 7.350–7.450)

## 2016-09-19 LAB — URINALYSIS, ROUTINE W REFLEX MICROSCOPIC
BILIRUBIN URINE: NEGATIVE
Glucose, UA: 50 mg/dL — AB
KETONES UR: NEGATIVE mg/dL
LEUKOCYTES UA: NEGATIVE
Nitrite: NEGATIVE
Protein, ur: NEGATIVE mg/dL
SPECIFIC GRAVITY, URINE: 1.018 (ref 1.005–1.030)
pH: 5 (ref 5.0–8.0)

## 2016-09-19 LAB — MRSA PCR SCREENING: MRSA by PCR: NEGATIVE

## 2016-09-19 LAB — HIV ANTIBODY (ROUTINE TESTING W REFLEX): HIV Screen 4th Generation wRfx: NONREACTIVE

## 2016-09-19 LAB — BRAIN NATRIURETIC PEPTIDE: B Natriuretic Peptide: 44.8 pg/mL (ref 0.0–100.0)

## 2016-09-19 MED ORDER — INSULIN ASPART 100 UNIT/ML ~~LOC~~ SOLN
0.0000 [IU] | Freq: Three times a day (TID) | SUBCUTANEOUS | Status: DC
  Administered 2016-09-19: 4 [IU] via SUBCUTANEOUS
  Administered 2016-09-20: 7 [IU] via SUBCUTANEOUS
  Administered 2016-09-20 – 2016-09-21 (×4): 4 [IU] via SUBCUTANEOUS
  Administered 2016-09-21: 7 [IU] via SUBCUTANEOUS
  Administered 2016-09-22: 3 [IU] via SUBCUTANEOUS

## 2016-09-19 MED ORDER — ALBUTEROL SULFATE (2.5 MG/3ML) 0.083% IN NEBU
2.5000 mg | INHALATION_SOLUTION | Freq: Four times a day (QID) | RESPIRATORY_TRACT | Status: DC
  Administered 2016-09-19 – 2016-09-20 (×3): 2.5 mg via RESPIRATORY_TRACT
  Filled 2016-09-19 (×4): qty 3

## 2016-09-19 MED ORDER — RISPERIDONE 1 MG PO TABS
1.0000 mg | ORAL_TABLET | Freq: Every day | ORAL | Status: DC
  Administered 2016-09-19 – 2016-09-21 (×3): 1 mg via ORAL
  Filled 2016-09-19 (×3): qty 1

## 2016-09-19 MED ORDER — METHYLPREDNISOLONE SODIUM SUCC 125 MG IJ SOLR
60.0000 mg | Freq: Four times a day (QID) | INTRAMUSCULAR | Status: DC
  Administered 2016-09-19 – 2016-09-20 (×3): 60 mg via INTRAVENOUS
  Filled 2016-09-19 (×4): qty 0.96

## 2016-09-19 MED ORDER — HYDROCODONE-ACETAMINOPHEN 7.5-325 MG PO TABS
1.0000 | ORAL_TABLET | Freq: Four times a day (QID) | ORAL | Status: DC | PRN
  Administered 2016-09-19 – 2016-09-22 (×4): 1 via ORAL
  Filled 2016-09-19 (×4): qty 1

## 2016-09-19 MED ORDER — SODIUM CHLORIDE 0.9 % IV SOLN
2000.0000 mg | Freq: Once | INTRAVENOUS | Status: AC
  Administered 2016-09-19: 2000 mg via INTRAVENOUS
  Filled 2016-09-19: qty 2000

## 2016-09-19 MED ORDER — ENOXAPARIN SODIUM 40 MG/0.4ML ~~LOC~~ SOLN
40.0000 mg | SUBCUTANEOUS | Status: DC
  Administered 2016-09-19 – 2016-09-22 (×4): 40 mg via SUBCUTANEOUS
  Filled 2016-09-19 (×4): qty 0.4

## 2016-09-19 MED ORDER — VANCOMYCIN HCL IN DEXTROSE 1-5 GM/200ML-% IV SOLN: 1000.0000 mg | Freq: Two times a day (BID) | INTRAVENOUS | Status: DC

## 2016-09-19 MED ORDER — LEVOFLOXACIN IN D5W 750 MG/150ML IV SOLN
750.0000 mg | Freq: Once | INTRAVENOUS | Status: AC
  Administered 2016-09-19: 750 mg via INTRAVENOUS
  Filled 2016-09-19: qty 150

## 2016-09-19 MED ORDER — OSELTAMIVIR PHOSPHATE 75 MG PO CAPS
75.0000 mg | ORAL_CAPSULE | Freq: Two times a day (BID) | ORAL | Status: DC
  Administered 2016-09-19 – 2016-09-22 (×7): 75 mg via ORAL
  Filled 2016-09-19 (×7): qty 1

## 2016-09-19 MED ORDER — DEXTROSE 5 % IV SOLN
2.0000 g | Freq: Once | INTRAVENOUS | Status: AC
  Administered 2016-09-19: 2 g via INTRAVENOUS
  Filled 2016-09-19: qty 2

## 2016-09-19 MED ORDER — METHYLPREDNISOLONE SODIUM SUCC 125 MG IJ SOLR
125.0000 mg | Freq: Once | INTRAMUSCULAR | Status: AC
Start: 2016-09-19 — End: 2016-09-19
  Administered 2016-09-19: 125 mg via INTRAVENOUS
  Filled 2016-09-19: qty 2

## 2016-09-19 MED ORDER — VANCOMYCIN HCL IN DEXTROSE 1-5 GM/200ML-% IV SOLN: 1000.0000 mg | Freq: Once | INTRAVENOUS | Status: DC

## 2016-09-19 MED ORDER — ACETAMINOPHEN 500 MG PO TABS
1000.0000 mg | ORAL_TABLET | Freq: Once | ORAL | Status: AC
  Administered 2016-09-19: 1000 mg via ORAL
  Filled 2016-09-19: qty 2

## 2016-09-19 MED ORDER — LEVOTHYROXINE SODIUM 50 MCG PO TABS
50.0000 ug | ORAL_TABLET | Freq: Every day | ORAL | Status: DC
  Administered 2016-09-19 – 2016-09-22 (×4): 50 ug via ORAL
  Filled 2016-09-19 (×5): qty 1

## 2016-09-19 MED ORDER — DEXTROSE 5 % IV SOLN
2.0000 g | Freq: Three times a day (TID) | INTRAVENOUS | Status: DC
  Filled 2016-09-19: qty 2

## 2016-09-19 MED ORDER — SODIUM CHLORIDE 0.9 % IV BOLUS (SEPSIS)
1000.0000 mL | Freq: Once | INTRAVENOUS | Status: AC
  Administered 2016-09-19: 1000 mL via INTRAVENOUS

## 2016-09-19 MED ORDER — INSULIN ASPART 100 UNIT/ML ~~LOC~~ SOLN
0.0000 [IU] | Freq: Every day | SUBCUTANEOUS | Status: DC
  Administered 2016-09-19 – 2016-09-21 (×2): 2 [IU] via SUBCUTANEOUS

## 2016-09-19 MED ORDER — LEVOFLOXACIN IN D5W 750 MG/150ML IV SOLN
750.0000 mg | INTRAVENOUS | Status: DC
  Administered 2016-09-20: 750 mg via INTRAVENOUS
  Filled 2016-09-19: qty 150

## 2016-09-19 MED ORDER — POTASSIUM CHLORIDE CRYS ER 20 MEQ PO TBCR
40.0000 meq | EXTENDED_RELEASE_TABLET | Freq: Two times a day (BID) | ORAL | Status: DC
  Administered 2016-09-19 (×2): 40 meq via ORAL
  Filled 2016-09-19 (×3): qty 2

## 2016-09-19 MED ORDER — INFLUENZA VAC SPLIT QUAD 0.5 ML IM SUSY
0.5000 mL | PREFILLED_SYRINGE | INTRAMUSCULAR | Status: AC
  Administered 2016-09-20: 0.5 mL via INTRAMUSCULAR
  Filled 2016-09-19: qty 0.5

## 2016-09-19 MED ORDER — ATORVASTATIN CALCIUM 10 MG PO TABS
10.0000 mg | ORAL_TABLET | Freq: Every day | ORAL | Status: DC
  Administered 2016-09-19 – 2016-09-22 (×4): 10 mg via ORAL
  Filled 2016-09-19 (×4): qty 1

## 2016-09-19 MED ORDER — TIOTROPIUM BROMIDE MONOHYDRATE 18 MCG IN CAPS
18.0000 ug | ORAL_CAPSULE | Freq: Every day | RESPIRATORY_TRACT | Status: DC
  Administered 2016-09-19: 18 ug via RESPIRATORY_TRACT
  Filled 2016-09-19: qty 5

## 2016-09-19 MED ORDER — ASPIRIN EC 81 MG PO TBEC
81.0000 mg | DELAYED_RELEASE_TABLET | Freq: Every day | ORAL | Status: DC
  Administered 2016-09-19 – 2016-09-22 (×4): 81 mg via ORAL
  Filled 2016-09-19 (×4): qty 1

## 2016-09-19 MED ORDER — ALBUTEROL SULFATE (2.5 MG/3ML) 0.083% IN NEBU
5.0000 mg | INHALATION_SOLUTION | Freq: Once | RESPIRATORY_TRACT | Status: AC
  Administered 2016-09-19: 5 mg via RESPIRATORY_TRACT
  Filled 2016-09-19: qty 6

## 2016-09-19 MED ORDER — FUROSEMIDE 20 MG PO TABS
20.0000 mg | ORAL_TABLET | Freq: Every day | ORAL | Status: DC
  Administered 2016-09-20 – 2016-09-22 (×3): 20 mg via ORAL
  Filled 2016-09-19 (×3): qty 1

## 2016-09-19 MED ORDER — SODIUM CHLORIDE 0.9 % IV SOLN
30.0000 meq | Freq: Once | INTRAVENOUS | Status: AC
  Administered 2016-09-19: 30 meq via INTRAVENOUS
  Filled 2016-09-19: qty 15

## 2016-09-19 MED ORDER — POTASSIUM CHLORIDE CRYS ER 20 MEQ PO TBCR
40.0000 meq | EXTENDED_RELEASE_TABLET | Freq: Once | ORAL | Status: AC
  Administered 2016-09-19: 40 meq via ORAL
  Filled 2016-09-19: qty 2

## 2016-09-19 NOTE — ED Provider Notes (Signed)
By signing my name below, I, Avnee Patel, attest that this documentation has been prepared under the direction and in the presence of [place], DO  Electronically Signed: Delton Prairie, ED Scribe. 09/19/16. 4:06 AM.  TIME SEEN: 3:56 AM  CHIEF COMPLAINT:  Chief Complaint  Patient presents with  . Weakness  . Shortness of Breath    HPI: LEVEL 5 CAVEAT DUE TO RESPIRATORY DISTRESS  Shirley Leblanc is a 71 y.o. female, with a hx of CHF, HTN, hyperlipidemia and asthma, who presents to the Emergency Department complaining of SOB onset today. Relative also reports a cough. Relative states the pt was seen yesterday by [facility] family medicine, was diagnosed with the flu and prescribed Tamiflu. Relative states the pt did not have a chest X-ray at this visit. No alleviating factors noted. Relative denies daily home oxygen use, a hx of COPD or any other associated symptoms. Granddaughter states patient lives at home with her. She states that she noticed that the patient seemed to be getting worse, breathing harder. No vomiting or diarrhea. No headache, chest pain.  ROS: LEVEL 5 CAVEAT DUE TO RESPIRATORY DISTRESS   PAST MEDICAL HISTORY/PAST SURGICAL HISTORY:  Past Medical History:  Diagnosis Date  . Bursitis   . Depression   . DJD (degenerative joint disease)   . GERD (gastroesophageal reflux disease)   . Hyperlipemia   . Hypertension   . Obesities, morbid ([place])   . Urinary incontinence     MEDICATIONS:  Prior to Admission medications   Medication Sig Start Date End Date Taking? Authorizing Provider  aspirin 81 MG EC tablet Take 81 mg by mouth daily.      Historical Provider, MD  atorvastatin (LIPITOR) 10 MG tablet Take 10 mg by mouth daily.    Historical Provider, MD  Cholecalciferol (VITAMIN D3) 2000 UNITS TABS Take 2,000 Units by mouth daily.     Historical Provider, MD  clonazePAM (KLONOPIN) 0.5 MG tablet Take 1 tablet (0.5 mg total) by mouth 2 (two)  times daily as needed for anxiety. 02/22/16   Claretta Fraise, MD  DULoxetine (CYMBALTA) 60 MG capsule TAKE 1 CAPSULE (60 MG TOTAL) BY MOUTH 2 (TWO) TIMES DAILY. Patient not taking: Reported on 09/18/2016 08/30/16   Claretta Fraise, MD  furosemide (LASIX) 20 MG tablet Take 1 tablet (20 mg total) by mouth daily. 05/17/15   Modena Jansky, MD  HYDROcodone-acetaminophen (NORCO) 7.5-325 MG tablet Take 1 tablet by mouth every 6 (six) hours as needed for moderate pain.    Historical Provider, MD  levothyroxine (SYNTHROID, LEVOTHROID) 50 MCG tablet Take 50 mcg by mouth daily.      Historical Provider, MD  meclizine (ANTIVERT) 25 MG tablet Take 1 tablet (25 mg total) by mouth 3 (three) times daily. Patient not taking: Reported on 09/18/2016 09/13/15   Charlann Lange, PA-C  ondansetron (ZOFRAN ODT) 8 MG disintegrating tablet Take 1 tablet (8 mg total) by mouth every 8 (eight) hours as needed for nausea or vomiting. 04/18/16   Claretta Fraise, MD  oseltamivir (TAMIFLU) 75 MG capsule Take 1 capsule (75 mg total) by mouth 2 (two) times daily. 09/18/16   [name], FNP  quinapril (ACCUPRIL) 40 MG tablet Take 40 mg by mouth daily.     Historical Provider, MD  risperiDONE (RISPERDAL) 1 MG tablet TAKE 1 TABLET (1 MG TOTAL) BY MOUTH AT BEDTIME. 08/12/16   Claretta Fraise, MD    ALLERGIES:  Allergies  Allergen Reactions  .  Contrast Media [Iodinated Diagnostic Agents] Anaphylaxis       . Penicillins Hives, Itching and Rash    Has patient had a PCN reaction causing immediate rash, facial/tongue/throat swelling, SOB or lightheadedness with hypotension Yes Has patient had a PCN reaction causing severe rash involving mucus membranes or skin necrosis: Yes Has patient had a PCN reaction that required hospitalization No Has patient had a PCN reaction occurring within the last 10 years: No If all of the above answers are "NO", then may proceed with Cephalosporin use.   . Sulfa Antibiotics Hives, Itching and Rash  . Morphine  And Related Itching    SOCIAL HISTORY:  Social History  Substance Use Topics  . Smoking status: Never Smoker  . Smokeless tobacco: Never Used  . Alcohol use No    FAMILY HISTORY: Family History  Problem Relation Age of Onset  . Lung disease Mother   . Heart attack Father   . Lung disease    . Heart attack      EXAM: BP 110/58   Pulse 111   Temp 100.1 F (37.8 C) (Oral)   Resp (!) 32   Ht 5\' 6"  (1.676 m)   Wt 265 lb (120.2 kg)   SpO2 92%   BMI 42.77 kg/m  CONSTITUTIONAL: Alert and oriented to person and place but not year and responds appropriately to questions. Elderly, chronically ill appearing. Febrile. Obese. HEAD: Normocephalic EYES: Conjunctivae clear, PERRL, EOMI ENT: normal nose; no rhinorrhea; moist mucous membranes NECK: Supple, no meningismus, no nuchal rigidity, no LAD  CARD: Regular and tachycardic; S1 and S2 appreciated; no murmurs, no clicks, no rubs, no gallops RESP: moderate respiratory distress. Diffuse expiratory wheezing. Mild rhonchorous breath sounds. Increased work of breathing. Tachypneic. Sats in the low 90s on nasal cannula. ABD/GI: Normal bowel sounds; non-distended; soft, non-tender, no rebound, no guarding, no peritoneal signs, no hepatosplenomegaly BACK:  The back appears normal and is non-tender to palpation, there is no CVA tenderness EXT: Normal ROM in all joints; non-tender to palpation; no edema; normal capillary refill; no cyanosis, no calf tenderness or swelling    SKIN: Normal color for age and race; warm; no rash NEURO: Moves all extremities equally PSYCH: The patient's mood and manner are appropriate. Grooming and personal hygiene are appropriate.   MEDICAL DECISION MAKING: Patient here with respiratory distress. Suspect sepsis. Recently diagnosed with the flu. We'll give albuterol, Tylenol, IV fluids and broad-spectrum antibiotics. Will obtain labs, cultures, chest x-ray, urine.  ED PROGRESS: Patient's resting heart rate  improving with albuterol and a fever is coming down. Her labs show mild hypokalemia with potassium of 2.8 with out EKG changes. She will receive oral and IV potassium. Troponin is also elevated but she denies chest pain and I suspect that this is demand ischemia from sepsis. Her blood gases reassuring. She does have a mild leukocytosis here with left shift. BNP is normal. Her chest x-ray shows a right hilar density which may represent focal infiltrate versus adenopathy versus mass. Suspect pneumonia. Clinically she appears to be improving significantly. We'll discuss with hospitalist for admission.   Lactate normal.  No hypotension.  She does not need 30 mL/kg IV fluid bolus.   Discussed with Dr. Olevia Bowens with hospitalist service for admission. He will place admission orders. Patient and family have been updated with this plan.   I reviewed all nursing notes, vitals, pertinent old records, EKGs, labs, imaging (as available).    EKG Interpretation  Date/Time:  Friday September 19 2016  03:36:16 EST Ventricular Rate:  114 PR Interval:    QRS Duration: 127 QT Interval:  342 QTC Calculation: 471 R Axis:   -72 Text Interpretation:  Sinus tachycardia Left bundle branch No significant change since last tracing Confirmed by Compton Brigance,  DO, Lajarvis Italiano [id number]) on 09/19/2016 3:51:44 AM        CRITICAL CARE Performed by: Nyra Jabs   Total critical care time: 45 minutes  Critical care time was exclusive of separately billable procedures and treating other patients.  Critical care was necessary to treat or prevent imminent or life-threatening deterioration.  Critical care was time spent personally by me on the following activities: development of treatment plan with patient and/or surrogate as well as nursing, discussions with consultants, evaluation of patient's response to treatment, examination of patient, obtaining history from patient or surrogate, ordering and performing treatments and  interventions, ordering and review of laboratory studies, ordering and review of radiographic studies, pulse oximetry and re-evaluation of patient's condition.     I personally performed the services described in this documentation, which was scribed in my presence. The recorded information has been reviewed and is accurate.     [place], DO 09/19/16 [phone]

## 2016-09-19 NOTE — ED Triage Notes (Signed)
Pt arrives to the ED via [facility]  from home, with complaints of weakness and shob. Wheezing all lobes. 5mg  albuterol given by ems, HR up to 126. 94% on 4L [place]. On room air pt saturations were in the 80's. Diagnosed with the flu on yesterday. Started on tamiflu. Generalized body aches.

## 2016-09-19 NOTE — Progress Notes (Signed)
Pharmacy Antibiotic Note  Shirley Leblanc is a 71 y.o. female admitted on 09/19/2016 with sepsis.  Pharmacy has been consulted for Vancomycin, Levaquin, Aztreonam dosing.  Plan: Levaquin 750mg  IV q24h Aztreonam 2gm IV q8h Vancomycin 2gm IV now then 1gm IV q12h Will f/u micro data, renal function, and pt's clinical condition Vanc trough prn   Height: 5\' 6"  (167.6 cm) Weight: 265 lb (120.2 kg) IBW/kg (Calculated) : 59.3  Temp (24hrs), Avg:100.6 F (38.1 C), Min:99.3 F (37.4 C), Max:102.4 F (39.1 C)   Recent Labs Lab 09/19/16 0406 09/19/16 0412  WBC 10.7*  --   CREATININE 0.73  --   LATICACIDVEN  --  1.55    Estimated Creatinine Clearance: 85.2 mL/min (by C-G formula based on SCr of 0.73 mg/dL).    Allergies  Allergen Reactions  . Contrast Media [Iodinated Diagnostic Agents] Anaphylaxis       . Penicillins Hives, Itching and Rash    Has patient had a PCN reaction causing immediate rash, facial/tongue/throat swelling, SOB or lightheadedness with hypotension Yes Has patient had a PCN reaction causing severe rash involving mucus membranes or skin necrosis: Yes Has patient had a PCN reaction that required hospitalization No Has patient had a PCN reaction occurring within the last 10 years: No If all of the above answers are "NO", then may proceed with Cephalosporin use.   . Sulfa Antibiotics Hives, Itching and Rash  . Morphine And Related Itching    Antimicrobials this admission: 2/23 Vanc >>  2/23 Levaquin >> 2/23 Aztreonam >>   Dose adjustments this admission: n/a  Microbiology results: 2/23 BCx x2:   UCx:    Thank you for allowing pharmacy to be a part of this patient's care.  Sherlon Handing, PharmD, BCPS Clinical pharmacist, pager [phone] 09/19/2016 5:53 AM

## 2016-09-19 NOTE — ED Notes (Signed)
Portable xray at bedside.

## 2016-09-19 NOTE — ED Notes (Signed)
Lab states critical troponin 0.23. MD Ward made aware.

## 2016-09-19 NOTE — ED Notes (Signed)
Tylenol last taken at 2200 yesterday

## 2016-09-19 NOTE — Care Management Note (Signed)
Case Management Note  Patient Details  Name: SHONTE BRIGNAC MRN: [id number] Date of Birth: Jul 04, 1946  Subjective/Objective:      Admitted with SOB              Action/Plan:   PTA independent from home with grand daughter.  Pt uses cane all the time to assist with ambulation in the home PTA.  Pt is not on home oxygen.  CM will continue to follow for discharge needs   Expected Discharge Date:                  Expected Discharge Plan:     In-House Referral:     Discharge planning Services  CM Consult  Post Acute Care Choice:    Choice offered to:     DME Arranged:    DME Agency:     HH Arranged:    HH Agency:     Status of Service:  In process, will continue to follow  If discussed at Long Length of Stay Meetings, dates discussed:    Additional Comments:  Maryclare Labrador, RN 09/19/2016, 1:43 PM

## 2016-09-19 NOTE — H&P (Signed)
History and Physical  ROSALEE BANIS [id number] DOB: 1945/12/19 DOA: 09/19/2016  Referring physician: Dr Leonides Schanz, ED physician PCP: Claretta Fraise, MD  Outpatient Specialists:   Patient Coming From: home  Chief Complaint: sob, cough  HPI: SIEARA GALLIK is a 71 y.o. female with a history of PAF not on anticoagulation due to frequent falls (CHADS2 VASC score 4), GERD, HTN, Obesity, Hypothyroidism, asthma. She comes via EMS due to 2 days of cough and wheezing, diagnosed with influenza A in the outpatient setting by her PCP and started on tamiflu. She received 2 doses. Last night, she was breathing hard and was less responsive and her grandaughter called EMS. Condition improved with O2. Sepsis protocol started. No other relieving or provoking factors. Denies fevers, chills. Does have productive cough with white sputum.   Emergency Department Course: Received fluid boluses per protocol. Started on Vancomycin, levaquin, and aztreonam. Blood cultures obtained in ED.  Review of Systems:   Pt denies any fevers, chills, nausea, vomiting, diarrhea, constipation, abdominal pain, shortness of breath, dyspnea on exertion, orthopnea, cough, wheezing, palpitations, headache, vision changes, lightheadedness, dizziness, melena, rectal bleeding.  Review of systems are otherwise negative  Past Medical History:  Diagnosis Date  . Bursitis   . Depression   . DJD (degenerative joint disease)   . GERD (gastroesophageal reflux disease)   . Hyperlipemia   . Hypertension   . Obesities, morbid ([place])   . Urinary incontinence    Past Surgical History:  Procedure Laterality Date  . CARDIAC CATHETERIZATION    . CHOLECYSTECTOMY N/A 05/14/2015   Procedure: LAPAROSCOPIC CHOLECYSTECTOMY;  Surgeon: Coralie Keens, MD;  Location: [facility];  Service: General;  Laterality: N/A;  . NECK SURGERY    . TOTAL KNEE ARTHROPLASTY     x2  . VAGINAL HYSTERECTOMY     Social History:  reports that she has never smoked. She has  never used smokeless tobacco. She reports that she does not drink alcohol or use drugs. Patient lives at home with grandaughter  Allergies  Allergen Reactions  . Contrast Media [Iodinated Diagnostic Agents] Anaphylaxis       . Penicillins Hives, Itching and Rash    Has patient had a PCN reaction causing immediate rash, facial/tongue/throat swelling, SOB or lightheadedness with hypotension Yes Has patient had a PCN reaction causing severe rash involving mucus membranes or skin necrosis: Yes Has patient had a PCN reaction that required hospitalization No Has patient had a PCN reaction occurring within the last 10 years: No If all of the above answers are "NO", then may proceed with Cephalosporin use.   . Sulfa Antibiotics Hives, Itching and Rash  . Morphine And Related Itching    Family History  Problem Relation Age of Onset  . Lung disease Mother   . Heart attack Father   . Lung disease    . Heart attack        Prior to Admission medications   Medication Sig Start Date End Date Taking? Authorizing Provider  Acetaminophen (TYLENOL PO) Take 2 tablets by mouth daily as needed (genetal pain).   Yes Historical Provider, MD  aspirin 81 MG EC tablet Take 81 mg by mouth daily.     Yes Historical Provider, MD  atorvastatin (LIPITOR) 10 MG tablet Take 10 mg by mouth daily.   Yes Historical Provider, MD  Cholecalciferol (VITAMIN D3) 2000 UNITS TABS Take 2,000 Units by mouth daily.    Yes Historical Provider, MD  furosemide (LASIX) 20 MG tablet Take 1 tablet (20  mg total) by mouth daily. 05/17/15  Yes Modena Jansky, MD  HYDROcodone-acetaminophen (NORCO) 7.5-325 MG tablet Take 1 tablet by mouth every 6 (six) hours as needed for moderate pain.   Yes Historical Provider, MD  levothyroxine (SYNTHROID, LEVOTHROID) 50 MCG tablet Take 50 mcg by mouth daily.     Yes Historical Provider, MD  oseltamivir (TAMIFLU) 75 MG capsule Take 1 capsule (75 mg total) by mouth 2 (two) times daily. 09/18/16  Yes  [name], FNP  quinapril (ACCUPRIL) 40 MG tablet Take 40 mg by mouth daily.    Yes Historical Provider, MD  risperiDONE (RISPERDAL) 1 MG tablet TAKE 1 TABLET (1 MG TOTAL) BY MOUTH AT BEDTIME. 08/12/16   Claretta Fraise, MD    Physical Exam: BP (!) 109/53   Pulse 87   Temp 102.4 F (39.1 C) (Rectal)   Resp (!) 26   Ht 5\' 6"  (1.676 m)   Wt 120.2 kg (265 lb)   SpO2 94%   BMI 42.77 kg/m   General: Elderly caucasian female. Awake and alert and oriented x3. No acute cardiopulmonary distress.  HEENT: Normocephalic atraumatic.  Right and left ears normal in appearance.  Pupils equal, round, reactive to light. Extraocular muscles are intact. Sclerae anicteric and noninjected.  Moist mucosal membranes. No mucosal lesions.  Neck: Neck supple without lymphadenopathy. No carotid bruits. No masses palpated.  Cardiovascular: Regular rate with normal S1-S2 sounds. No murmurs, rubs, gallops auscultated. No JVD.  Respiratory: Wheezing diffusely. No accessory muscles used. Abdomen: Soft, mild tenderness, nondistended. Active bowel sounds. No masses or hepatosplenomegaly  Skin: No rashes, lesions, or ulcerations.  Dry, warm to touch. 2+ dorsalis pedis and radial pulses. Musculoskeletal: No calf or leg pain. All major joints not erythematous nontender.  No upper or lower joint deformation.  Good ROM.  No contractures  Psychiatric: Intact judgment and insight. Pleasant and cooperative. Neurologic: No focal neurological deficits. Strength is 5/5 and symmetric in upper and lower extremities.  Cranial nerves II through XII are grossly intact.          Sepsis - Repeat Assessment  Performed at:    0845  Vitals     Blood pressure (!) 109/53, pulse 87, temperature 102.4 F (39.1 C), temperature source Rectal, resp. rate (!) 26, height 5\' 6"  (1.676 m), weight 120.2 kg (265 lb), SpO2 94 %.  Heart:     Regular rate and rhythm  Lungs:    Wheezing and Rhonchi  Capillary Refill:   <2 sec  Peripheral  Pulse:   Radial pulse palpable  Skin:     Normal Color      Labs on Admission: I have personally reviewed following labs and imaging studies  CBC:  Recent Labs Lab 09/19/16 0406  WBC 10.7*  NEUTROABS 9.3*  HGB 12.8  HCT 38.6  MCV 90.0  PLT [id number]   Basic Metabolic Panel:  Recent Labs Lab 09/19/16 0406  NA 131*  K 2.8*  CL 91*  CO2 32  GLUCOSE 195*  BUN 9  CREATININE 0.73  CALCIUM 9.1   GFR: Estimated Creatinine Clearance: 85.2 mL/min (by C-G formula based on SCr of 0.73 mg/dL). Liver Function Tests:  Recent Labs Lab 09/19/16 0406  AST 35  ALT 26  ALKPHOS 71  BILITOT 0.9  PROT 7.1  ALBUMIN 3.4*   No results for input(s): LIPASE, AMYLASE in the last 168 hours. No results for input(s): AMMONIA in the last 168 hours. Coagulation Profile: No results for input(s): INR, PROTIME in  the last 168 hours. Cardiac Enzymes:  Recent Labs Lab 09/19/16 0803  TROPONINI 0.40*   BNP (last 3 results) No results for input(s): PROBNP in the last 8760 hours. HbA1C: No results for input(s): HGBA1C in the last 72 hours. CBG: No results for input(s): GLUCAP in the last 168 hours. Lipid Profile: No results for input(s): CHOL, HDL, LDLCALC, TRIG, CHOLHDL, LDLDIRECT in the last 72 hours. Thyroid Function Tests: No results for input(s): TSH, T4TOTAL, FREET4, T3FREE, THYROIDAB in the last 72 hours. Anemia Panel: No results for input(s): VITAMINB12, FOLATE, FERRITIN, TIBC, IRON, RETICCTPCT in the last 72 hours. Urine analysis:    Component Value Date/Time   COLORURINE YELLOW 04/16/2016 0852   APPEARANCEUR Clear 09/18/2016 1627   LABSPEC 1.015 04/16/2016 0852   PHURINE 7.5 04/16/2016 0852   GLUCOSEU Negative 09/18/2016 1627   HGBUR TRACE (A) 04/16/2016 0852   BILIRUBINUR Negative 09/18/2016 [place] 04/16/2016 0852   PROTEINUR Negative 09/18/2016 1627   PROTEINUR 30 (A) 04/16/2016 0852   UROBILINOGEN 0.2 05/09/2015 1033   NITRITE Negative 09/18/2016  1627   NITRITE NEGATIVE 04/16/2016 0852   LEUKOCYTESUR Negative 09/18/2016 1627   Sepsis Labs: @LABRCNTIP (procalcitonin:4,lacticidven:4) ) Recent Results (from the past 240 hour(s))  Veritor Flu A/B Waived     Status: Abnormal   Collection Time: 09/18/16  4:25 PM  Result Value Ref Range Status   Influenza A Positive (A) Negative Final   Influenza B Negative Negative Final    Comment: If the test is negative for the presence of influenza A or influenza B antigen, infection due to influenza cannot be ruled-out because the antigen present in the sample may be below the detection limit of the test. It is recommended that these results be confirmed by viral culture or an FDA-cleared influenza A and B molecular assay.   Microscopic Examination     Status: None   Collection Time: 09/18/16  4:27 PM  Result Value Ref Range Status   WBC, UA 0-5 0 - 5 /hpf Final   RBC, UA 0-2 0 - 2 /hpf Final   Epithelial Cells (non renal) 0-10 0 - 10 /hpf Final   Renal Epithel, UA None seen None seen /hpf Final   Bacteria, UA None seen None seen/Few Final     Radiological Exams on Admission: Dg Chest Portable 1 View  Result Date: 09/19/2016 CLINICAL DATA:  71 year old female with fever, shortness of breath, wheezing. EXAM: PORTABLE CHEST 1 VIEW COMPARISON:  Chest radiograph dated 04/16/2016 FINDINGS: There is shallow inspiration with bibasilar atelectatic changes. Right perihilar density appears more prominent compared to the prior radiograph. A focal infiltrate or adenopathy is not excluded. Correlation with clinical exam and follow-up recommended. There is linear atelectasis/scarring in the periphery of the left lower lung field. No pleural effusion or pneumothorax. There is mild cardiomegaly. Mild atherosclerotic calcification of the aortic arch. There is degenerative changes of the spine and shoulders with old healed fracture of the left humeral neck. Cervical fixation plate and screws noted. IMPRESSION: 1.  Right hilar density may represent focal infiltrate versus adenopathy/mass. Clinical correlation and follow-up recommended. 2. Shallow inspiration with minimal bibasilar atelectasis. Electronically Signed   By: Anner Crete M.D.   On: 09/19/2016 05:30    EKG: Independently reviewed. Sinus tachycardia  Assessment/Plan: Principal Problem:   Sepsis ([place]) Active Problems:   Essential hypertension   Atrial fibrillation ([facility])   CAP (community acquired pneumonia)   Influenza   Diabetes ([place])    This patient  was discussed with the ED physician, including pertinent vitals, physical exam findings, labs, and imaging.  We also discussed care given by the ED provider.  1. Sepsis  Admit to stepdown  Compensated - recheck done 2. CAP Antibiotics: deescalate to levaquin Robitussin Blood cultures drawn in the emergency department Sputum cultures CBC tomorrow Strep antigen by urine 3. Influenza  Cont Tamiflu 4. DM2  SSI, CBG AC/qhs 5. AFib  Paroxysmal.  NSR now. Not candidate for chronic anticoag due to frequent falls 6. HTN  Hold antihypertensives for now.  DVT prophylaxis: lovenox Consultants: none Code Status: DNR Family Communication: grandaughter  Disposition Plan: possibly return home   Truett Mainland, DO [facility] Pager [phone]  If 7PM-7AM, please contact night-coverage [email or web address] Password [email or web address]

## 2016-09-20 ENCOUNTER — Inpatient Hospital Stay (HOSPITAL_COMMUNITY): Payer: Medicare Other

## 2016-09-20 DIAGNOSIS — J111 Influenza due to unidentified influenza virus with other respiratory manifestations: Secondary | ICD-10-CM

## 2016-09-20 DIAGNOSIS — R072 Precordial pain: Secondary | ICD-10-CM

## 2016-09-20 DIAGNOSIS — J9601 Acute respiratory failure with hypoxia: Secondary | ICD-10-CM

## 2016-09-20 DIAGNOSIS — A419 Sepsis, unspecified organism: Secondary | ICD-10-CM

## 2016-09-20 DIAGNOSIS — R748 Abnormal levels of other serum enzymes: Secondary | ICD-10-CM

## 2016-09-20 DIAGNOSIS — J189 Pneumonia, unspecified organism: Secondary | ICD-10-CM

## 2016-09-20 DIAGNOSIS — R0600 Dyspnea, unspecified: Secondary | ICD-10-CM

## 2016-09-20 LAB — BASIC METABOLIC PANEL
Anion gap: 10 (ref 5–15)
BUN: 9 mg/dL (ref 6–20)
CALCIUM: 9.2 mg/dL (ref 8.9–10.3)
CO2: 28 mmol/L (ref 22–32)
CREATININE: 0.51 mg/dL (ref 0.44–1.00)
Chloride: 99 mmol/L — ABNORMAL LOW (ref 101–111)
GFR calc Af Amer: >60 mL/min (ref >60)
GFR calc non Af Amer: >60 mL/min (ref >60)
GLUCOSE: 182 mg/dL — AB (ref 65–99)
Potassium: 4.6 mmol/L (ref 3.5–5.1)
Sodium: 137 mmol/L (ref 135–145)

## 2016-09-20 LAB — CBC
HCT: 36.5 % (ref 36.0–46.0)
HEMOGLOBIN: 11.7 g/dL — AB (ref 12.0–15.0)
MCH: 29.6 pg (ref 26.0–34.0)
MCHC: 32.1 g/dL (ref 30.0–36.0)
MCV: 92.4 fL (ref 78.0–100.0)
Platelets: 163 10*3/uL (ref 150–400)
RBC: 3.95 MIL/uL (ref 3.87–5.11)
RDW: 13.3 % (ref 11.5–15.5)
WBC: 5 10*3/uL (ref 4.0–10.5)

## 2016-09-20 LAB — GLUCOSE, CAPILLARY
GLUCOSE-CAPILLARY: 191 mg/dL — AB (ref 65–99)
Glucose-Capillary: 169 mg/dL — ABNORMAL HIGH (ref 65–99)
Glucose-Capillary: 213 mg/dL — ABNORMAL HIGH (ref 65–99)
Glucose-Capillary: 247 mg/dL — ABNORMAL HIGH (ref 65–99)

## 2016-09-20 LAB — ECHOCARDIOGRAM COMPLETE
Height: 65 in
WEIGHTICAEL: 4337.6 [oz_av]

## 2016-09-20 LAB — URINE CULTURE: Culture: NO GROWTH

## 2016-09-20 MED ORDER — ONDANSETRON HCL 4 MG/2ML IJ SOLN: 4.0000 mg | Freq: Four times a day (QID) | INTRAMUSCULAR | Status: DC | PRN

## 2016-09-20 MED ORDER — LEVOFLOXACIN 750 MG PO TABS
750.0000 mg | ORAL_TABLET | Freq: Every day | ORAL | Status: DC
  Administered 2016-09-21 – 2016-09-22 (×2): 750 mg via ORAL
  Filled 2016-09-20 (×2): qty 1

## 2016-09-20 MED ORDER — POLYETHYLENE GLYCOL 3350 17 G PO PACK
17.0000 g | PACK | Freq: Every day | ORAL | Status: DC
  Administered 2016-09-21 – 2016-09-22 (×2): 17 g via ORAL
  Filled 2016-09-20 (×2): qty 1

## 2016-09-20 MED ORDER — GUAIFENESIN ER 600 MG PO TB12
1200.0000 mg | ORAL_TABLET | Freq: Two times a day (BID) | ORAL | Status: DC
  Administered 2016-09-20 – 2016-09-22 (×5): 1200 mg via ORAL
  Filled 2016-09-20 (×5): qty 2

## 2016-09-20 MED ORDER — BENZONATATE 100 MG PO CAPS
200.0000 mg | ORAL_CAPSULE | Freq: Three times a day (TID) | ORAL | Status: DC | PRN
  Administered 2016-09-22: 200 mg via ORAL
  Filled 2016-09-20: qty 2

## 2016-09-20 MED ORDER — ALPRAZOLAM 0.25 MG PO TABS: 0.2500 mg | ORAL_TABLET | Freq: Three times a day (TID) | ORAL | Status: DC | PRN

## 2016-09-20 MED ORDER — ALBUTEROL SULFATE (2.5 MG/3ML) 0.083% IN NEBU: 2.5000 mg | INHALATION_SOLUTION | RESPIRATORY_TRACT | Status: DC | PRN

## 2016-09-20 MED ORDER — BISACODYL 5 MG PO TBEC
5.0000 mg | DELAYED_RELEASE_TABLET | Freq: Once | ORAL | Status: AC
  Administered 2016-09-20: 5 mg via ORAL
  Filled 2016-09-20: qty 1

## 2016-09-20 MED ORDER — BUDESONIDE 0.25 MG/2ML IN SUSP
0.2500 mg | Freq: Two times a day (BID) | RESPIRATORY_TRACT | Status: DC
  Administered 2016-09-20 – 2016-09-22 (×4): 0.25 mg via RESPIRATORY_TRACT
  Filled 2016-09-20 (×6): qty 2

## 2016-09-20 MED ORDER — ACETAMINOPHEN 325 MG PO TABS: 650.0000 mg | ORAL_TABLET | Freq: Four times a day (QID) | ORAL | Status: DC | PRN

## 2016-09-20 MED ORDER — METHYLPREDNISOLONE SODIUM SUCC 125 MG IJ SOLR
60.0000 mg | Freq: Three times a day (TID) | INTRAMUSCULAR | Status: DC
  Administered 2016-09-20 – 2016-09-21 (×3): 60 mg via INTRAVENOUS
  Filled 2016-09-20 (×3): qty 2
  Filled 2016-09-20: qty 0.96

## 2016-09-20 MED ORDER — IPRATROPIUM-ALBUTEROL 0.5-2.5 (3) MG/3ML IN SOLN
3.0000 mL | Freq: Four times a day (QID) | RESPIRATORY_TRACT | Status: DC
  Administered 2016-09-20 (×3): 3 mL via RESPIRATORY_TRACT
  Filled 2016-09-20 (×4): qty 3

## 2016-09-20 NOTE — Progress Notes (Signed)
Report received from [facility] in ICU 78M

## 2016-09-20 NOTE — Progress Notes (Signed)
Shirley Leblanc is a 71 y.o. female patient admitted from 51M ICU awake, alert - oriented  X 4 - no acute distress noted.  VSS - Blood pressure 124/68, pulse 81, temperature 98.1 F (36.7 C), temperature source Oral, resp. rate 20, height 5\' 5"  (1.651 m), weight 123 kg (271 lb 1.6 oz), SpO2 96 %.    IV in place, occlusive dsg intact without redness.  Orientation to room, and floor completed with information packet given to patient/family.  Patient declined safety video at this time.  Admission INP armband ID verified with patient/family, and in place.   SR up x 2, fall assessment complete, with patient and family able to verbalize understanding of risk associated with falls, and verbalized understanding to call nsg before up out of bed.  Call light within reach, patient able to voice, and demonstrate understanding.  Skin, clean-dry- intact without evidence of bruising, or skin tears.   No evidence of skin break down noted on exam.     Will cont to eval and treat per MD orders.  Dorris Carnes, RN 09/20/2016 12:56 PM

## 2016-09-20 NOTE — Progress Notes (Signed)
PROGRESS NOTE        PATIENT DETAILS Name: Shirley Leblanc Age: 71 y.o. Sex: female Date of Birth: August 22, 1945 Admit Date: 09/19/2016 Admitting Physician Reubin Milan, MD [id number], MD  Brief Narrative: Patient is a 71 y.o. female with past medical history of paroxysmal atrial fibrillation (not on anticoagulation), dyslipidemia,hypertension, hypothyroidism, intermittent bronchial asthma who was diagnosed with influenza as an outpatient and started on Tamiflu, she presented to the hospital with weakness, confusion and worsening shortness of breath. She was thought to have sepsis due to influenza/bacterial pneumonia along with acute bronchitis and admitted to the hospitalist service for further evaluation and treatment.  Subjective: Awake-alert. Feels much better than yesterday. Oxygen titrated down to 2-3 L this morning.  Assessment/Plan: Sepsis probably secondary to influenza with pneumonitis/bronchitis: Sepsis pathophysiology has resolved, await culture data. Continue with Tamiflu and empiric levofloxacin.  Acute hypoxemic respiratory failure secondary to acute asthmatic bronchitis: Likely triggered due to influenza. Still wheezing, but overall much better. Initially required up to 6 L of oxygen, now titrated down to 2-3 L. Plans are to continue antimicrobial therapy along with IV steroids and bronchodilator regimen. Since clinically improved, can be downgraded to telemetry.  Minimally elevated troponin: Trend is flat-likely demand ischemia-clinical scenario not consistent with ACS. Continue aspirin. Note-left bundle branch on EKG is chronic.Check Echo  Paroxysmal atrial fibrillation: In sinus rhythm, has diffuse anticoagulation in the past-remains on aspirin.CHA2DVASC score of 2.  Hypothyroidism: Continue with Synthroid  Dyslipidemia: Continue statin  Hypertension: Controlled, continue Lasix-if blood pressure continues to be stable-suspect could  resume ACE inhibitor or the next few days.  OSA: Continue CPAP daily at bedtime.  DVT Prophylaxis: Prophylactic Lovenox or Heparin or SCD's  Code Status: DO NOT RESUSCITATE-reconfirmed with patient.  Family Communication: None at bedside-patient is awake and alert, and understands the above noted plan.  Disposition Plan: Remain inpatient-but can be transferred to telemetry.   Antimicrobial agents: Anti-infectives    Start     Dose/Rate Route Frequency Ordered Stop   09/20/16 0600  levofloxacin (LEVAQUIN) IVPB 750 mg     750 mg 100 mL/hr over 90 Minutes Intravenous Every 24 hours 09/19/16 0557     09/19/16 1800  vancomycin (VANCOCIN) IVPB 1000 mg/200 mL premix  Status:  Discontinued     1,000 mg 200 mL/hr over 60 Minutes Intravenous Every 12 hours 09/19/16 0557 09/19/16 0842   09/19/16 1400  aztreonam (AZACTAM) 2 g in dextrose 5 % 50 mL IVPB  Status:  Discontinued     2 g 100 mL/hr over 30 Minutes Intravenous Every 8 hours 09/19/16 0557 09/19/16 0842   09/19/16 1000  oseltamivir (TAMIFLU) capsule 75 mg     75 mg Oral 2 times daily 09/19/16 0853 09/24/16 0959   09/19/16 0415  levofloxacin (LEVAQUIN) IVPB 750 mg     750 mg 100 mL/hr over 90 Minutes Intravenous  Once 09/19/16 0406 09/19/16 0600   09/19/16 0415  aztreonam (AZACTAM) 2 g in dextrose 5 % 50 mL IVPB     2 g 100 mL/hr over 30 Minutes Intravenous  Once 09/19/16 0406 09/19/16 0642   09/19/16 0415  vancomycin (VANCOCIN) IVPB 1000 mg/200 mL premix  Status:  Discontinued     1,000 mg 200 mL/hr over 60 Minutes Intravenous  Once 09/19/16 0406 09/19/16 0407   09/19/16 0415  vancomycin (VANCOCIN) 2,000 mg in  sodium chloride 0.9 % 500 mL IVPB     2,000 mg 250 mL/hr over 120 Minutes Intravenous  Once 09/19/16 0407 09/19/16 0925      Procedures: None  CONSULTS:  None  Time spent: 25- minutes-Greater than 50% of this time was spent in counseling, explanation of diagnosis, planning of further management, and coordination  of care.  MEDICATIONS: Scheduled Meds: . aspirin EC  81 mg Oral Daily  . atorvastatin  10 mg Oral Daily  . bisacodyl  5 mg Oral Once  . budesonide (PULMICORT) nebulizer solution  0.25 mg Nebulization BID  . enoxaparin (LOVENOX) injection  40 mg Subcutaneous Q24H  . furosemide  20 mg Oral Daily  . guaiFENesin  1,200 mg Oral BID  . Influenza vac split quadrivalent PF  0.5 mL Intramuscular Tomorrow-1000  . insulin aspart  0-20 Units Subcutaneous TID WC  . insulin aspart  0-5 Units Subcutaneous QHS  . ipratropium-albuterol  3 mL Nebulization Q6H  . levofloxacin (LEVAQUIN) IV  750 mg Intravenous Q24H  . levothyroxine  50 mcg Oral QAC breakfast  . methylPREDNISolone (SOLU-MEDROL) injection  60 mg Intravenous Q8H  . oseltamivir  75 mg Oral BID  . [START ON 09/21/2016] polyethylene glycol  17 g Oral Daily  . risperiDONE  1 mg Oral QHS   Continuous Infusions: PRN Meds:.acetaminophen, albuterol, ALPRAZolam, benzonatate, HYDROcodone-acetaminophen, ondansetron (ZOFRAN) IV   PHYSICAL EXAM: Vital signs: Vitals:   09/20/16 0359 09/20/16 0400 09/20/16 0500 09/20/16 0600  BP:  114/86 116/65 115/71  Pulse:  74 66 69  Resp:  16 17 17   Temp: 98.9 F (37.2 C)     TempSrc: Oral     SpO2:  95% 96% 93%  Weight:      Height:       Filed Weights   09/19/16 0336  Weight: 120.2 kg (265 lb)   Body mass index is 42.77 kg/m.   General appearance :Awake, alert, not in any distress. Speech Clear.  Eyes:, pupils equally reactive to light and accomodation,no scleral icterus. HEENT: Atraumatic and Normocephalic Neck: supple, no JVD. No cervical lymphadenopathy.  Resp:Good air entry bilaterally, coarse rhonchi all over CVS: S1 S2 regular GI: Bowel sounds present, Non tender and not distended with no gaurding, rigidity or rebound.No organomegaly Extremities: B/L Lower Ext shows no edema, both legs are warm to touch Neurology:  speech clear,Non focal, sensation is grossly intact. Psychiatric: Normal  judgment and insight. Alert and oriented x 3. Normal mood. Musculoskeletal:No digital cyanosis Skin:No Rash, warm and dry Wounds:N/A  I have personally reviewed following labs and imaging studies  LABORATORY DATA: CBC:  Recent Labs Lab 09/19/16 0406 09/20/16 0256  WBC 10.7* 5.0  NEUTROABS 9.3*  --   HGB 12.8 11.7*  HCT 38.6 36.5  MCV 90.0 92.4  PLT 169 [id number]    Basic Metabolic Panel:  Recent Labs Lab 09/19/16 0406 09/20/16 0256  NA 131* 137  K 2.8* 4.6  CL 91* 99*  CO2 32 28  GLUCOSE 195* 182*  BUN 9 9  CREATININE 0.73 0.51  CALCIUM 9.1 9.2    GFR: Estimated Creatinine Clearance: 85.2 mL/min (by C-G formula based on SCr of 0.51 mg/dL).  Liver Function Tests:  Recent Labs Lab 09/19/16 0406  AST 35  ALT 26  ALKPHOS 71  BILITOT 0.9  PROT 7.1  ALBUMIN 3.4*   No results for input(s): LIPASE, AMYLASE in the last 168 hours. No results for input(s): AMMONIA in the last 168 hours.  Coagulation Profile: No  results for input(s): INR, PROTIME in the last 168 hours.  Cardiac Enzymes:  Recent Labs Lab 09/19/16 0803 09/19/16 1219 09/19/16 1528 09/19/16 1835  TROPONINI 0.40* 0.32* 0.24* 0.19*    BNP (last 3 results) No results for input(s): PROBNP in the last 8760 hours.  HbA1C: No results for input(s): HGBA1C in the last 72 hours.  CBG:  Recent Labs Lab 09/19/16 1116 09/19/16 1521 09/19/16 2131  GLUCAP 128* 192* 226*    Lipid Profile: No results for input(s): CHOL, HDL, LDLCALC, TRIG, CHOLHDL, LDLDIRECT in the last 72 hours.  Thyroid Function Tests: No results for input(s): TSH, T4TOTAL, FREET4, T3FREE, THYROIDAB in the last 72 hours.  Anemia Panel: No results for input(s): VITAMINB12, FOLATE, FERRITIN, TIBC, IRON, RETICCTPCT in the last 72 hours.  Urine analysis:    Component Value Date/Time   COLORURINE YELLOW 09/19/2016 [place] 09/19/2016 1818   APPEARANCEUR Clear 09/18/2016 1627   LABSPEC 1.018 09/19/2016 1818     PHURINE 5.0 09/19/2016 1818   GLUCOSEU 50 (A) 09/19/2016 1818   HGBUR MODERATE (A) 09/19/2016 1818   BILIRUBINUR NEGATIVE 09/19/2016 1818   BILIRUBINUR Negative 09/18/2016 1627   KETONESUR NEGATIVE 09/19/2016 1818   PROTEINUR NEGATIVE 09/19/2016 1818   UROBILINOGEN 0.2 05/09/2015 1033   NITRITE NEGATIVE 09/19/2016 1818   LEUKOCYTESUR NEGATIVE 09/19/2016 1818   LEUKOCYTESUR Negative 09/18/2016 1627    Sepsis Labs: Lactic Acid, Venous    Component Value Date/Time   LATICACIDVEN 1.55 09/19/2016 0412    MICROBIOLOGY: Recent Results (from the past 240 hour(s))  Veritor Flu A/B Waived     Status: Abnormal   Collection Time: 09/18/16  4:25 PM  Result Value Ref Range Status   Influenza A Positive (A) Negative Final   Influenza B Negative Negative Final    Comment: If the test is negative for the presence of influenza A or influenza B antigen, infection due to influenza cannot be ruled-out because the antigen present in the sample may be below the detection limit of the test. It is recommended that these results be confirmed by viral culture or an FDA-cleared influenza A and B molecular assay.   Microscopic Examination     Status: None   Collection Time: 09/18/16  4:27 PM  Result Value Ref Range Status   WBC, UA 0-5 0 - 5 /hpf Final   RBC, UA 0-2 0 - 2 /hpf Final   Epithelial Cells (non renal) 0-10 0 - 10 /hpf Final   Renal Epithel, UA None seen None seen /hpf Final   Bacteria, UA None seen None seen/Few Final  Culture, sputum-assessment     Status: None   Collection Time: 09/19/16  8:50 AM  Result Value Ref Range Status   Specimen Description EXPECTORATED SPUTUM  Final   Special Requests NONE  Final   Sputum evaluation THIS SPECIMEN IS ACCEPTABLE FOR SPUTUM CULTURE  Final   Report Status 09/19/2016 FINAL  Final  Culture, respiratory (NON-Expectorated)     Status: None (Preliminary result)   Collection Time: 09/19/16  8:50 AM  Result Value Ref Range Status   Specimen  Description EXPECTORATED SPUTUM  Final   Special Requests NONE Reflexed from [id number]  Final   Gram Stain   Final    MODERATE SQUAMOUS EPITHELIAL CELLS PRESENT ABUNDANT GRAM POSITIVE COCCI IN CLUSTERS MODERATE GRAM POSITIVE RODS MODERATE GRAM POSITIVE COCCI IN PAIRS    Culture PENDING  Incomplete   Report Status PENDING  Incomplete  MRSA PCR Screening  Status: None   Collection Time: 09/19/16  8:52 AM  Result Value Ref Range Status   MRSA by PCR NEGATIVE NEGATIVE Final    Comment:        The GeneXpert MRSA Assay (FDA approved for NASAL specimens only), is one component of a comprehensive MRSA colonization surveillance program. It is not intended to diagnose MRSA infection nor to guide or monitor treatment for MRSA infections.     RADIOLOGY STUDIES/RESULTS: Dg Chest Portable 1 View  Result Date: 09/19/2016 CLINICAL DATA:  71 year old female with fever, shortness of breath, wheezing. EXAM: PORTABLE CHEST 1 VIEW COMPARISON:  Chest radiograph dated 04/16/2016 FINDINGS: There is shallow inspiration with bibasilar atelectatic changes. Right perihilar density appears more prominent compared to the prior radiograph. A focal infiltrate or adenopathy is not excluded. Correlation with clinical exam and follow-up recommended. There is linear atelectasis/scarring in the periphery of the left lower lung field. No pleural effusion or pneumothorax. There is mild cardiomegaly. Mild atherosclerotic calcification of the aortic arch. There is degenerative changes of the spine and shoulders with old healed fracture of the left humeral neck. Cervical fixation plate and screws noted. IMPRESSION: 1. Right hilar density may represent focal infiltrate versus adenopathy/mass. Clinical correlation and follow-up recommended. 2. Shallow inspiration with minimal bibasilar atelectasis. Electronically Signed   By: Anner Crete M.D.   On: 09/19/2016 05:30     LOS: 1 day   Oren Binet, MD  [facility] Pager:[phone]  If 7PM-7AM, please contact night-coverage [email or web address] Password [facility] 09/20/2016, 7:42 AM

## 2016-09-20 NOTE — Progress Notes (Signed)
  Echocardiogram 2D Echocardiogram has been performed.  Shirley Leblanc 09/20/2016, 3:01 PM

## 2016-09-20 NOTE — Progress Notes (Signed)
Patient's oxygen began to drop on [place] at 6L We put patient on 15L and began to put NRB on and her sats began to rise after 2 mins. Will put patient back on CPAP after bath as she had requested earlier in the night to have [place] put back on. Will continue to monitor closely. Joaquin Bend E, [place] 09/20/2016 [phone]

## 2016-09-20 NOTE — Evaluation (Signed)
Physical Therapy Evaluation Patient Details Name: Shirley Leblanc MRN: [id number] DOB: 11/17/1945 Today's Date: 09/20/2016   History of Present Illness  Patient is a 71 yo female admitted 09/19/16 with cough, sepsis, weakness, confusion.  Patient recently dx with Influenza started on Tamiflu.  Patient with sepsis.     PMH:  PAF, HLD, HTN, asthma, OSA on CPAP, obesity, DM  Clinical Impression  Patient is functioning at supervision level with mobility and gait.  Patient safe ambulating with RW.  Functioning at baseline level for mobility.  No acute PT needs identified - PT will sign off.  Encouraged ambulation with nursing.    Follow Up Recommendations No PT follow up;Supervision for mobility/OOB    Equipment Recommendations  Rolling walker with 5" wheels    Recommendations for Other Services       Precautions / Restrictions Precautions Precautions: Fall Precaution Comments: h/o falls at home Restrictions Weight Bearing Restrictions: No      Mobility  Bed Mobility Overal bed mobility: Needs Assistance Bed Mobility: Supine to Sit;Sit to Supine     Supine to sit: Min guard Sit to supine: Min assist   General bed mobility comments: Assist to bring LE's onto bed to return to supine.  Transfers Overall transfer level: Modified independent Equipment used: Rolling walker (2 wheeled)                Ambulation/Gait Ambulation/Gait assistance: Supervision Ambulation Distance (Feet): 60 Feet Assistive device: Rolling walker (2 wheeled) Gait Pattern/deviations: Step-through pattern;Decreased stride length;Trunk flexed Gait velocity: decreased Gait velocity interpretation: Below normal speed for age/gender General Gait Details: Patient with slow, steady gait with RW. No loss of balance during gait.  Patient able to stand and turn to straighten pad on bed, and back up to bed with no loss of balance.  Stairs            Wheelchair Mobility    Modified Rankin (Stroke  Patients Only)       Balance Overall balance assessment: No apparent balance deficits (not formally assessed)                                           Pertinent Vitals/Pain Pain Assessment: No/denies pain    Home Living Family/patient expects to be discharged to:: Private residence Living Arrangements: Children Available Help at Discharge: Family;Available 24 hours/day Type of Home: Mobile home Home Access: Stairs to enter Entrance Stairs-Rails: Left Entrance Stairs-Number of Steps: 4 Home Layout: One level Home Equipment: Grab bars - tub/shower;Cane - single point      Prior Function Level of Independence: Independent with assistive device(s);Needs assistance   Gait / Transfers Assistance Needed: Patient uses cane for ambulation  ADL's / Homemaking Assistance Needed: Granddaughter assists patient with bathing and with meal prep.  Family provides patient's transportation.        Hand Dominance        Extremity/Trunk Assessment   Upper Extremity Assessment Upper Extremity Assessment: Generalized weakness;LUE deficits/detail LUE Deficits / Details: Decreased shoulder ROM due to prior injury    Lower Extremity Assessment Lower Extremity Assessment: Generalized weakness       Communication   Communication: HOH  Cognition Arousal/Alertness: Awake/alert Behavior During Therapy: WFL for tasks assessed/performed Overall Cognitive Status: Within Functional Limits for tasks assessed  General Comments      Exercises     Assessment/Plan    PT Assessment Patent does not need any further PT services  PT Problem List         PT Treatment Interventions      PT Goals (Current goals can be found in the Care Plan section)  Acute Rehab PT Goals PT Goal Formulation: All assessment and education complete, DC therapy    Frequency     Barriers to discharge        Co-evaluation               End of Session  Equipment Utilized During Treatment: Gait belt Activity Tolerance: Patient tolerated treatment well;Patient limited by fatigue Patient left: in bed;with call bell/phone within reach Nurse Communication: Mobility status (Encouraged ambulation in room/hallway with nursing) PT Visit Diagnosis: Muscle weakness (generalized) (M62.81);Other abnormalities of gait and mobility (R26.89)         Time: [id number] PT Time Calculation (min) (ACUTE ONLY): 19 min   Charges:   PT Evaluation $PT Eval Moderate Complexity: 1 Procedure     PT G Codes:         Despina Pole 10/17/16, 8:04 PM Carita Pian. Sanjuana Kava, [place] Pager [phone]

## 2016-09-21 DIAGNOSIS — I480 Paroxysmal atrial fibrillation: Secondary | ICD-10-CM

## 2016-09-21 DIAGNOSIS — I10 Essential (primary) hypertension: Secondary | ICD-10-CM

## 2016-09-21 LAB — GLUCOSE, CAPILLARY
GLUCOSE-CAPILLARY: 205 mg/dL — AB (ref 65–99)
GLUCOSE-CAPILLARY: 161 mg/dL — AB (ref 65–99)
Glucose-Capillary: 193 mg/dL — ABNORMAL HIGH (ref 65–99)
Glucose-Capillary: 225 mg/dL — ABNORMAL HIGH (ref 65–99)

## 2016-09-21 MED ORDER — METHYLPREDNISOLONE SODIUM SUCC 40 MG IJ SOLR
40.0000 mg | Freq: Two times a day (BID) | INTRAMUSCULAR | Status: DC
  Administered 2016-09-21 – 2016-09-22 (×2): 40 mg via INTRAVENOUS
  Filled 2016-09-21 (×2): qty 1

## 2016-09-21 MED ORDER — IPRATROPIUM-ALBUTEROL 0.5-2.5 (3) MG/3ML IN SOLN
3.0000 mL | Freq: Three times a day (TID) | RESPIRATORY_TRACT | Status: DC
  Administered 2016-09-21 – 2016-09-22 (×4): 3 mL via RESPIRATORY_TRACT
  Filled 2016-09-21 (×4): qty 3

## 2016-09-21 MED ORDER — QUINAPRIL HCL 10 MG PO TABS
40.0000 mg | ORAL_TABLET | Freq: Every day | ORAL | Status: DC
  Administered 2016-09-21 – 2016-09-22 (×2): 40 mg via ORAL
  Filled 2016-09-21 (×2): qty 4

## 2016-09-21 NOTE — Plan of Care (Signed)
Problem: Safety: Goal: Ability to remain free from injury will improve Outcome: Progressing Pt will remain free from falls during my shift, pt understands that she needs to hit the call bell to get assistance

## 2016-09-21 NOTE — Progress Notes (Signed)
PROGRESS NOTE        PATIENT DETAILS Name: Shirley Leblanc Age: 71 y.o. Sex: female Date of Birth: 09-Nov-1945 Admit Date: 09/19/2016 Admitting Physician Reubin Milan, MD [id number], MD  Brief Narrative: Patient is a 71 y.o. female with past medical history of paroxysmal atrial fibrillation (not on anticoagulation), dyslipidemia,hypertension, hypothyroidism, intermittent bronchial asthma who was diagnosed with influenza as an outpatient and started on Tamiflu, she presented to the hospital with weakness, confusion and worsening shortness of breath. She was thought to have sepsis due to influenza/bacterial pneumonia along with acute bronchitis and admitted to the hospitalist service for further evaluation and treatment.  Subjective: Awake-alert. Feels significantly better compared to admission.   Assessment/Plan: Sepsis probably secondary to influenza with pneumonitis/bronchitis: Sepsis pathophysiology has resolved, blood cultures negative. Continue with Tamiflu and empiric levofloxacin.  Acute hypoxemic respiratory failure secondary to acute asthmatic bronchitis: Likely triggered due to influenza. Moving air well, hardly any wheezing today. Have asked RN to titrate off oxygen to see if she can be on room air. Taper IV Solu-Medrol, continue Tamiflu, levofloxacin and bronchodilator regimen.  Minimally elevated troponin: Trend is flat-likely demand ischemia-clinical scenario not consistent with ACS. Continue aspirin. Note-left bundle branch on EKG is chronic. Echo shows preserved EF without any wall motion abnormality.  Paroxysmal atrial fibrillation: In sinus rhythm, has diffuse anticoagulation in the past-remains on aspirin.CHA2DVASC score of 2.  Hypothyroidism: Continue with Synthroid  Dyslipidemia: Continue statin  Hypertension: Although BP stable-seems to be slowly creeping up, resume ACE inhibitor-continue Lasix. Follow.   OSA: Continue CPAP daily  at bedtime.  DVT Prophylaxis: Prophylactic Lovenox   Code Status: DO NOT RESUSCITATE-reconfirmed with patient.  Family Communication: None at bedside-patient is awake and alert, and understands the above noted plan.  Disposition Plan: Remain if clinical improvement continues, home on 2/26  Antimicrobial agents: Anti-infectives    Start     Dose/Rate Route Frequency Ordered Stop   09/21/16 1000  levofloxacin (LEVAQUIN) tablet 750 mg     750 mg Oral Daily 09/20/16 1014     09/20/16 0600  levofloxacin (LEVAQUIN) IVPB 750 mg  Status:  Discontinued     750 mg 100 mL/hr over 90 Minutes Intravenous Every 24 hours 09/19/16 0557 09/20/16 1014   09/19/16 1800  vancomycin (VANCOCIN) IVPB 1000 mg/200 mL premix  Status:  Discontinued     1,000 mg 200 mL/hr over 60 Minutes Intravenous Every 12 hours 09/19/16 0557 09/19/16 0842   09/19/16 1400  aztreonam (AZACTAM) 2 g in dextrose 5 % 50 mL IVPB  Status:  Discontinued     2 g 100 mL/hr over 30 Minutes Intravenous Every 8 hours 09/19/16 0557 09/19/16 0842   09/19/16 1000  oseltamivir (TAMIFLU) capsule 75 mg     75 mg Oral 2 times daily 09/19/16 0853 09/24/16 0959   09/19/16 0415  levofloxacin (LEVAQUIN) IVPB 750 mg     750 mg 100 mL/hr over 90 Minutes Intravenous  Once 09/19/16 0406 09/19/16 0600   09/19/16 0415  aztreonam (AZACTAM) 2 g in dextrose 5 % 50 mL IVPB     2 g 100 mL/hr over 30 Minutes Intravenous  Once 09/19/16 0406 09/19/16 0642   09/19/16 0415  vancomycin (VANCOCIN) IVPB 1000 mg/200 mL premix  Status:  Discontinued     1,000 mg 200 mL/hr over 60 Minutes Intravenous  Once 09/19/16 0406  09/19/16 0407   09/19/16 0415  vancomycin (VANCOCIN) 2,000 mg in sodium chloride 0.9 % 500 mL IVPB     2,000 mg 250 mL/hr over 120 Minutes Intravenous  Once 09/19/16 0407 09/19/16 0925      Procedures: None  CONSULTS:  None  Time spent: 25- minutes-Greater than 50% of this time was spent in counseling, explanation of diagnosis, planning  of further management, and coordination of care.  MEDICATIONS: Scheduled Meds: . aspirin EC  81 mg Oral Daily  . atorvastatin  10 mg Oral Daily  . budesonide (PULMICORT) nebulizer solution  0.25 mg Nebulization BID  . enoxaparin (LOVENOX) injection  40 mg Subcutaneous Q24H  . furosemide  20 mg Oral Daily  . guaiFENesin  1,200 mg Oral BID  . insulin aspart  0-20 Units Subcutaneous TID WC  . insulin aspart  0-5 Units Subcutaneous QHS  . ipratropium-albuterol  3 mL Nebulization TID  . levofloxacin  750 mg Oral Daily  . levothyroxine  50 mcg Oral QAC breakfast  . methylPREDNISolone (SOLU-MEDROL) injection  60 mg Intravenous Q8H  . oseltamivir  75 mg Oral BID  . polyethylene glycol  17 g Oral Daily  . risperiDONE  1 mg Oral QHS   Continuous Infusions: PRN Meds:.acetaminophen, albuterol, ALPRAZolam, benzonatate, HYDROcodone-acetaminophen, ondansetron (ZOFRAN) IV   PHYSICAL EXAM: Vital signs: Vitals:   09/20/16 2103 09/20/16 2353 09/21/16 0652 09/21/16 0721  BP:  (!) 141/77 (!) 156/77   Pulse:  86 81   Resp:  18 18   Temp:  99.1 F (37.3 C) 98 F (36.7 C)   TempSrc:  Oral Oral   SpO2: 95% 93% 94% 96%  Weight:      Height:       Filed Weights   09/19/16 0336 09/20/16 1219  Weight: 120.2 kg (265 lb) 123 kg (271 lb 1.6 oz)   Body mass index is 45.11 kg/m.   General appearance :Awake, alert, not in any distress. Speech Clear.  Eyes:, pupils equally reactive to light and accomodation,no scleral icterus. HEENT: Atraumatic and Normocephalic Neck: supple, no JVD. No cervical lymphadenopathy.  Resp:Good air entry bilaterally, Only a few scattered rhonchi all over CVS: S1 S2 regular GI: Bowel sounds present, Non tender and not distended with no gaurding, rigidity or rebound.No organomegaly Extremities: B/L Lower Ext shows no edema, both legs are warm to touch Neurology:  speech clear,Non focal, sensation is grossly intact. Psychiatric: Normal judgment and insight. Alert and  oriented x 3. Normal mood. Musculoskeletal:No digital cyanosis Skin:No Rash, warm and dry Wounds:N/A  I have personally reviewed following labs and imaging studies  LABORATORY DATA: CBC:  Recent Labs Lab 09/19/16 0406 09/20/16 0256  WBC 10.7* 5.0  NEUTROABS 9.3*  --   HGB 12.8 11.7*  HCT 38.6 36.5  MCV 90.0 92.4  PLT 169 [id number]    Basic Metabolic Panel:  Recent Labs Lab 09/19/16 0406 09/20/16 0256  NA 131* 137  K 2.8* 4.6  CL 91* 99*  CO2 32 28  GLUCOSE 195* 182*  BUN 9 9  CREATININE 0.73 0.51  CALCIUM 9.1 9.2    GFR: Estimated Creatinine Clearance: 84.9 mL/min (by C-G formula based on SCr of 0.51 mg/dL).  Liver Function Tests:  Recent Labs Lab 09/19/16 0406  AST 35  ALT 26  ALKPHOS 71  BILITOT 0.9  PROT 7.1  ALBUMIN 3.4*   No results for input(s): LIPASE, AMYLASE in the last 168 hours. No results for input(s): AMMONIA in the last 168 hours.  Coagulation Profile: No results for input(s): INR, PROTIME in the last 168 hours.  Cardiac Enzymes:  Recent Labs Lab 09/19/16 0803 09/19/16 1219 09/19/16 1528 09/19/16 1835  TROPONINI 0.40* 0.32* 0.24* 0.19*    BNP (last 3 results) No results for input(s): PROBNP in the last 8760 hours.  HbA1C: No results for input(s): HGBA1C in the last 72 hours.  CBG:  Recent Labs Lab 09/20/16 0912 09/20/16 1232 09/20/16 1753 09/20/16 2355 09/21/16 0814  GLUCAP 191* 169* 213* 247* 205*    Lipid Profile: No results for input(s): CHOL, HDL, LDLCALC, TRIG, CHOLHDL, LDLDIRECT in the last 72 hours.  Thyroid Function Tests: No results for input(s): TSH, T4TOTAL, FREET4, T3FREE, THYROIDAB in the last 72 hours.  Anemia Panel: No results for input(s): VITAMINB12, FOLATE, FERRITIN, TIBC, IRON, RETICCTPCT in the last 72 hours.  Urine analysis:    Component Value Date/Time   COLORURINE YELLOW 09/19/2016 [place] 09/19/2016 1818   APPEARANCEUR Clear 09/18/2016 1627   LABSPEC 1.018  09/19/2016 1818   PHURINE 5.0 09/19/2016 1818   GLUCOSEU 50 (A) 09/19/2016 1818   HGBUR MODERATE (A) 09/19/2016 1818   BILIRUBINUR NEGATIVE 09/19/2016 1818   BILIRUBINUR Negative 09/18/2016 1627   KETONESUR NEGATIVE 09/19/2016 1818   PROTEINUR NEGATIVE 09/19/2016 1818   UROBILINOGEN 0.2 05/09/2015 1033   NITRITE NEGATIVE 09/19/2016 1818   LEUKOCYTESUR NEGATIVE 09/19/2016 1818   LEUKOCYTESUR Negative 09/18/2016 1627    Sepsis Labs: Lactic Acid, Venous    Component Value Date/Time   LATICACIDVEN 1.55 09/19/2016 0412    MICROBIOLOGY: Recent Results (from the past 240 hour(s))  Veritor Flu A/B Waived     Status: Abnormal   Collection Time: 09/18/16  4:25 PM  Result Value Ref Range Status   Influenza A Positive (A) Negative Final   Influenza B Negative Negative Final    Comment: If the test is negative for the presence of influenza A or influenza B antigen, infection due to influenza cannot be ruled-out because the antigen present in the sample may be below the detection limit of the test. It is recommended that these results be confirmed by viral culture or an FDA-cleared influenza A and B molecular assay.   Microscopic Examination     Status: None   Collection Time: 09/18/16  4:27 PM  Result Value Ref Range Status   WBC, UA 0-5 0 - 5 /hpf Final   RBC, UA 0-2 0 - 2 /hpf Final   Epithelial Cells (non renal) 0-10 0 - 10 /hpf Final   Renal Epithel, UA None seen None seen /hpf Final   Bacteria, UA None seen None seen/Few Final  Blood Culture (routine x 2)     Status: None (Preliminary result)   Collection Time: 09/19/16  4:21 AM  Result Value Ref Range Status   Specimen Description BLOOD RIGHT HAND  Final   Special Requests BOTTLES DRAWN AEROBIC AND ANAEROBIC 5ML  Final   Culture NO GROWTH 1 DAY  Final   Report Status PENDING  Incomplete  Blood Culture (routine x 2)     Status: None (Preliminary result)   Collection Time: 09/19/16  4:28 AM  Result Value Ref Range Status    Specimen Description BLOOD LEFT HAND  Final   Special Requests BOTTLES DRAWN AEROBIC AND ANAEROBIC 5ML  Final   Culture NO GROWTH 1 DAY  Final   Report Status PENDING  Incomplete  Culture, sputum-assessment     Status: None   Collection Time: 09/19/16  8:50  AM  Result Value Ref Range Status   Specimen Description EXPECTORATED SPUTUM  Final   Special Requests NONE  Final   Sputum evaluation THIS SPECIMEN IS ACCEPTABLE FOR SPUTUM CULTURE  Final   Report Status 09/19/2016 FINAL  Final  Culture, respiratory (NON-Expectorated)     Status: None (Preliminary result)   Collection Time: 09/19/16  8:50 AM  Result Value Ref Range Status   Specimen Description EXPECTORATED SPUTUM  Final   Special Requests NONE Reflexed from [id number]  Final   Gram Stain   Final    MODERATE SQUAMOUS EPITHELIAL CELLS PRESENT ABUNDANT GRAM POSITIVE COCCI IN CLUSTERS MODERATE GRAM POSITIVE RODS MODERATE GRAM POSITIVE COCCI IN PAIRS    Culture CULTURE REINCUBATED FOR BETTER GROWTH  Final   Report Status PENDING  Incomplete  MRSA PCR Screening     Status: None   Collection Time: 09/19/16  8:52 AM  Result Value Ref Range Status   MRSA by PCR NEGATIVE NEGATIVE Final    Comment:        The GeneXpert MRSA Assay (FDA approved for NASAL specimens only), is one component of a comprehensive MRSA colonization surveillance program. It is not intended to diagnose MRSA infection nor to guide or monitor treatment for MRSA infections.   Urine culture     Status: None   Collection Time: 09/19/16  6:19 PM  Result Value Ref Range Status   Specimen Description URINE, RANDOM  Final   Special Requests NONE  Final   Culture NO GROWTH  Final   Report Status 09/20/2016 FINAL  Final    RADIOLOGY STUDIES/RESULTS: Dg Chest Portable 1 View  Result Date: 09/19/2016 CLINICAL DATA:  71 year old female with fever, shortness of breath, wheezing. EXAM: PORTABLE CHEST 1 VIEW COMPARISON:  Chest radiograph dated 04/16/2016 FINDINGS: There  is shallow inspiration with bibasilar atelectatic changes. Right perihilar density appears more prominent compared to the prior radiograph. A focal infiltrate or adenopathy is not excluded. Correlation with clinical exam and follow-up recommended. There is linear atelectasis/scarring in the periphery of the left lower lung field. No pleural effusion or pneumothorax. There is mild cardiomegaly. Mild atherosclerotic calcification of the aortic arch. There is degenerative changes of the spine and shoulders with old healed fracture of the left humeral neck. Cervical fixation plate and screws noted. IMPRESSION: 1. Right hilar density may represent focal infiltrate versus adenopathy/mass. Clinical correlation and follow-up recommended. 2. Shallow inspiration with minimal bibasilar atelectasis. Electronically Signed   By: Anner Crete M.D.   On: 09/19/2016 05:30   Dg Chest Port 1v Same Day  Result Date: 09/20/2016 CLINICAL DATA:  Dry cough for 3 days, fever.  Shortness of breath. EXAM: PORTABLE CHEST 1 VIEW COMPARISON:  09/19/2016. FINDINGS: Trachea is midline. Heart size stable. Lungs are low in volume with minimal streaky volume loss in the lung bases. No definite pleural fluid. Stimulator wires project over the mid thoracic spine. IMPRESSION: Low lung volumes with minimal streaky volume loss in the lung bases. Electronically Signed   By: Lorin Picket M.D.   On: 09/20/2016 09:24     LOS: 2 days   Oren Binet, MD  [facility] Pager:[phone]  If 7PM-7AM, please contact night-coverage [email or web address] Password [email or web address] 09/21/2016, 11:30 AM

## 2016-09-21 NOTE — Progress Notes (Signed)
Pt's home CPAP/tubing/nasal pillows set up for use per pt request with 6L O2 bled in. RT checked plugs/cords, no fraying noted. Pt tolerating CPAP at this time. RT will continue to monitor.

## 2016-09-21 NOTE — Progress Notes (Signed)
Pt has CPAP from home and plans to wear tonight.  RT inspected machine for damages and none were detected.  Pt's machine is working properly at this time.  Pt states that she will self administer when ready for bed.  RT to monitor and assess as needed.

## 2016-09-22 ENCOUNTER — Encounter (HOSPITAL_COMMUNITY): Payer: Self-pay

## 2016-09-22 LAB — CULTURE, RESPIRATORY W GRAM STAIN: Culture: NORMAL

## 2016-09-22 LAB — GLUCOSE, CAPILLARY: Glucose-Capillary: 182 mg/dL — ABNORMAL HIGH (ref 65–99)

## 2016-09-22 LAB — CULTURE, RESPIRATORY

## 2016-09-22 MED ORDER — GUAIFENESIN ER 600 MG PO TB12: 600.0000 mg | ORAL_TABLET | Freq: Two times a day (BID) | ORAL | 0 refills | Status: DC

## 2016-09-22 MED ORDER — ALBUTEROL SULFATE HFA 108 (90 BASE) MCG/ACT IN AERS: 2.0000 | INHALATION_SPRAY | Freq: Four times a day (QID) | RESPIRATORY_TRACT | 0 refills | Status: DC | PRN

## 2016-09-22 MED ORDER — BENZONATATE 200 MG PO CAPS: 200.0000 mg | ORAL_CAPSULE | Freq: Three times a day (TID) | ORAL | 0 refills | Status: DC | PRN

## 2016-09-22 MED ORDER — PREDNISONE 10 MG PO TABS: ORAL_TABLET | ORAL | 0 refills | Status: DC

## 2016-09-22 MED ORDER — LEVOFLOXACIN 750 MG PO TABS: 750.0000 mg | ORAL_TABLET | Freq: Every day | ORAL | 0 refills | Status: DC

## 2016-09-22 MED ORDER — FLUTICASONE-SALMETEROL 250-50 MCG/DOSE IN AEPB: 1.0000 | INHALATION_SPRAY | Freq: Two times a day (BID) | RESPIRATORY_TRACT | 0 refills | Status: DC

## 2016-09-22 MED ORDER — OSELTAMIVIR PHOSPHATE 75 MG PO CAPS: 75.0000 mg | ORAL_CAPSULE | Freq: Two times a day (BID) | ORAL | 0 refills | Status: AC

## 2016-09-22 NOTE — Discharge Summary (Signed)
PATIENT DETAILS Name: Shirley Leblanc Age: 71 y.o. Sex: female Date of Birth: [birth year] MRN: [id number]. Admitting Physician: Reubin Milan, MD [id number], MD  Admit Date: 09/19/2016 Discharge date: 09/22/2016  Recommendations for Outpatient Follow-up:  1. Follow up with PCP in 1-2 weeks 2. Please obtain BMP/CBC in one week 3. Repeat 2 view chest x-ray in 4-6 weeks to document resolution of pneumonia 4. Ensure follow-up with cardiology 5. Please follow up on the following pending results: Blood cultures until final  Admitted From:  Home  Disposition: [place]: No  Equipment/Devices: None  Discharge Condition: Stable  CODE STATUS: FULL CODE  Diet recommendation:  Heart Healthy   Brief Summary: See H&P, Labs, Consult and Test reports for all details in brief,Patient is a 71 y.o. female with past medical history of paroxysmal atrial fibrillation (not on anticoagulation), dyslipidemia,hypertension, hypothyroidism, intermittent bronchial asthma who was diagnosed with influenza as an outpatient and started on Tamiflu, she presented to the hospital with weakness, confusion and worsening shortness of breath. She was thought to have sepsis due to influenza/bacterial pneumonia along with acute bronchitis and admitted to the hospitalist service for further evaluation and treatment.  Brief Hospital Course: Sepsis probably secondary to influenza with pneumonitis/bronchitis: Sepsis pathophysiology has resolved, blood cultures negative so far .  she was managed with Tamiflu and empiric levofloxacin. Since she is clinically improved, she'll be discharged home today-she needs 1 more dose of Tamiflu this evening to complete a 5 day course he did she will continue with levofloxacin for a few more days. Patient will need a repeat 2 view chest x-ray in 4-6 weeks (please note a chest x-ray on 2/23 showed a right hilar density that could be pneumonia or a mass-plans are to  complete antimicrobial therapy and have outpatient chest x-ray-this recommendation was discussed with patient in detail this morning)  Acute hypoxemic respiratory failure secondary to acute asthmatic bronchitis: Likely triggered due to influenza. Moving air well, without any rhonchi at all today. She has been tapered off oxygen since 2/25 is now on room air. Plans are to transition to oral prednisone, place on as needed albuterol inhaler, scheduled Advair and follow-up with PCP for further continued care.   Minimally elevated troponin: Trend is flat-likely demand ischemia-clinical scenario not consistent with ACS-did not have any chest pain. Continue aspirin. Note-left bundle branch on EKG is chronic. Echo shows preserved EF without any wall motion abnormality. She had a negative/low risk nuclear stress test late 2016.Have asked patient to follow up with her primary cardiologist in the next few weeks.  Paroxysmal atrial fibrillation: In sinus rhythm, has refused anticoagulation in the past-remains on aspirin.CHA2DVASC score of 2.  Hypothyroidism: Continue with Synthroid  Dyslipidemia: Continue statin  Hypertension: Although BP stable-seems to be slowly creeping up, resume ACE inhibitor-continue Lasix. Follow.   OSA: Continue CPAP daily at bedtime.  Procedures/Studies: Echo 2/24>> - Normal LV systolic function; mild LVH; grade 1 diastolic   dysfunction; calcified aortic valve with mild AS; mild LAE.   Discharge Diagnoses:  Principal Problem:   Sepsis [facility]) Active Problems:   Essential hypertension   Atrial fibrillation (HCC)   CAP (community acquired pneumonia)   Influenza   Diabetes [facility])   Discharge Instructions:  Activity:  As tolerated with Full fall precautions use walker/cane & assistance as needed   Discharge Instructions    Call MD for:  difficulty breathing, headache or visual disturbances    Complete by:  As directed  Call MD for:  persistant nausea and  vomiting    Complete by:  As directed    Diet - low sodium heart healthy    Complete by:  As directed    Discharge instructions    Complete by:  As directed    Follow with Primary MD  STACKS,WARREN, MD in 1 week  Follow with your primary Cardiologist in 1-2 weeks  Repeat 2 view Chest Xray in 4-6 weeks  Please get a complete blood count and chemistry panel checked by your Primary MD at your next visit, and again as instructed by your Primary MD.  Get Medicines reviewed and adjusted: Please take all your medications with you for your next visit with your Primary MD  Laboratory/radiological data: Please request your Primary MD to go over all hospital tests and procedure/radiological results at the follow up, please ask your Primary MD to get all Hospital records sent to his/her office.  In some cases, they will be blood work, cultures and biopsy results pending at the time of your discharge. Please request that your primary care M.D. follows up on these results.  Also Note the following: If you experience worsening of your admission symptoms, develop shortness of breath, life threatening emergency, suicidal or homicidal thoughts you must seek medical attention immediately by calling 911 or calling your MD immediately  if symptoms less severe.  You must read complete instructions/literature along with all the possible adverse reactions/side effects for all the Medicines you take and that have been prescribed to you. Take any new Medicines after you have completely understood and accpet all the possible adverse reactions/side effects.   Do not drive when taking Pain medications or sleeping medications (Benzodaizepines)  Do not take more than prescribed Pain, Sleep and Anxiety Medications. It is not advisable to combine anxiety,sleep and pain medications without talking with your primary care practitioner  Special Instructions: If you have smoked or chewed Tobacco  in the last 2 yrs please  stop smoking, stop any regular Alcohol  and or any Recreational drug use.  Wear Seat belts while driving.  Please note: You were cared for by a hospitalist during your hospital stay. Once you are discharged, your primary care physician will handle any further medical issues. Please note that NO REFILLS for any discharge medications will be authorized once you are discharged, as it is imperative that you return to your primary care physician (or establish a relationship with a primary care physician if you do not have one) for your post hospital discharge needs so that they can reassess your need for medications and monitor your lab values.   Increase activity slowly    Complete by:  As directed      Allergies as of 09/22/2016      Reactions   Contrast Media [iodinated Diagnostic Agents] Anaphylaxis      Penicillins Hives, Itching, Rash   Has patient had a PCN reaction causing immediate rash, facial/tongue/throat swelling, SOB or lightheadedness with hypotension Yes Has patient had a PCN reaction causing severe rash involving mucus membranes or skin necrosis: Yes Has patient had a PCN reaction that required hospitalization No Has patient had a PCN reaction occurring within the last 10 years: No If all of the above answers are "NO", then may proceed with Cephalosporin use.   Sulfa Antibiotics Hives, Itching, Rash   Morphine And Related Itching      Medication List    TAKE these medications   albuterol 108 (90 Base) MCG/ACT  inhaler Commonly known as:  PROVENTIL HFA;VENTOLIN HFA Inhale 2 puffs into the lungs every 6 (six) hours as needed for wheezing or shortness of breath.   aspirin 81 MG EC tablet Take 81 mg by mouth daily.   atorvastatin 10 MG tablet Commonly known as:  LIPITOR Take 10 mg by mouth daily.   benzonatate 200 MG capsule Commonly known as:  TESSALON Take 1 capsule (200 mg total) by mouth 3 (three) times daily as needed for cough.   Fluticasone-Salmeterol 250-50  MCG/DOSE Aepb Commonly known as:  ADVAIR DISKUS Inhale 1 puff into the lungs 2 (two) times daily.   furosemide 20 MG tablet Commonly known as:  LASIX Take 1 tablet (20 mg total) by mouth daily.   guaiFENesin 600 MG 12 hr tablet Commonly known as:  MUCINEX Take 1 tablet (600 mg total) by mouth 2 (two) times daily.   HYDROcodone-acetaminophen 7.5-325 MG tablet Commonly known as:  NORCO Take 1 tablet by mouth every 6 (six) hours as needed for moderate pain.   levofloxacin 750 MG tablet Commonly known as:  LEVAQUIN Take 1 tablet (750 mg total) by mouth daily.   levothyroxine 50 MCG tablet Commonly known as:  SYNTHROID, LEVOTHROID Take 50 mcg by mouth daily.   oseltamivir 75 MG capsule Commonly known as:  TAMIFLU Take 1 capsule (75 mg total) by mouth 2 (two) times daily. Last dose 2/26 evening. What changed:  additional instructions   predniSONE 10 MG tablet Commonly known as:  DELTASONE Take 4 tablets (40 mg) daily for 2 days, then, Take 3 tablets (30 mg) daily for 2 days, then, Take 2 tablets (20 mg) daily for 2 days, then, Take 1 tablets (10 mg) daily for 1 days, then stop   quinapril 40 MG tablet Commonly known as:  ACCUPRIL Take 40 mg by mouth daily.   risperiDONE 1 MG tablet Commonly known as:  RISPERDAL TAKE 1 TABLET (1 MG TOTAL) BY MOUTH AT BEDTIME.   TYLENOL PO Take 2 tablets by mouth daily as needed (genetal pain).   Vitamin D3 2000 units Tabs Take 2,000 Units by mouth daily.      Follow-up Information    STACKS,WARREN, MD. Schedule an appointment as soon as possible for a visit in 1 week(s).   Specialty:  Family Medicine Contact information: [place] [address] [phone]        Minus Breeding, MD. Schedule an appointment as soon as possible for a visit in 2 week(s).   Specialty:  Cardiology Contact information: [address] [place] [address] [phone]          Allergies  Allergen Reactions  . Contrast  Media [Iodinated Diagnostic Agents] Anaphylaxis       . Penicillins Hives, Itching and Rash    Has patient had a PCN reaction causing immediate rash, facial/tongue/throat swelling, SOB or lightheadedness with hypotension Yes Has patient had a PCN reaction causing severe rash involving mucus membranes or skin necrosis: Yes Has patient had a PCN reaction that required hospitalization No Has patient had a PCN reaction occurring within the last 10 years: No If all of the above answers are "NO", then may proceed with Cephalosporin use.   . Sulfa Antibiotics Hives, Itching and Rash  . Morphine And Related Itching    Consultations:   None   Other Procedures/Studies: Dg Chest Portable 1 View  Result Date: 09/19/2016 CLINICAL DATA:  71 year old female with fever, shortness of breath, wheezing. EXAM: PORTABLE CHEST 1  VIEW COMPARISON:  Chest radiograph dated 04/16/2016 FINDINGS: There is shallow inspiration with bibasilar atelectatic changes. Right perihilar density appears more prominent compared to the prior radiograph. A focal infiltrate or adenopathy is not excluded. Correlation with clinical exam and follow-up recommended. There is linear atelectasis/scarring in the periphery of the left lower lung field. No pleural effusion or pneumothorax. There is mild cardiomegaly. Mild atherosclerotic calcification of the aortic arch. There is degenerative changes of the spine and shoulders with old healed fracture of the left humeral neck. Cervical fixation plate and screws noted. IMPRESSION: 1. Right hilar density may represent focal infiltrate versus adenopathy/mass. Clinical correlation and follow-up recommended. 2. Shallow inspiration with minimal bibasilar atelectasis. Electronically Signed   By: Anner Crete M.D.   On: 09/19/2016 05:30   Dg Chest Port 1v Same Day  Result Date: 09/20/2016 CLINICAL DATA:  Dry cough for 3 days, fever.  Shortness of breath. EXAM: PORTABLE CHEST 1 VIEW COMPARISON:   09/19/2016. FINDINGS: Trachea is midline. Heart size stable. Lungs are low in volume with minimal streaky volume loss in the lung bases. No definite pleural fluid. Stimulator wires project over the mid thoracic spine. IMPRESSION: Low lung volumes with minimal streaky volume loss in the lung bases. Electronically Signed   By: Lorin Picket M.D.   On: 09/20/2016 09:24      TODAY-DAY OF DISCHARGE:  Subjective:   Jasey Shelton today has no headache,no chest abdominal pain,no new weakness tingling or numbness, feels much better wants to go home today.   Objective:   Blood pressure (!) 146/78, pulse 89, temperature 98.1 F (36.7 C), resp. rate 18, height 5\' 5"  (1.651 m), weight 123 kg (271 lb 1.6 oz), SpO2 95 %. No intake or output data in the 24 hours ending 09/22/16 0754 Filed Weights   09/19/16 0336 09/20/16 1219  Weight: 120.2 kg (265 lb) 123 kg (271 lb 1.6 oz)    Exam: Awake Alert, Oriented *3, No new F.N deficits, Normal affect [place].AT,PERRAL Supple Neck,No JVD, No cervical lymphadenopathy appriciated.  Symmetrical Chest wall movement, Good air movement bilaterally, CTAB RRR,No Gallops,Rubs or new Murmurs, No Parasternal Heave +ve B.Sounds, Abd Soft, Non tender, No organomegaly appriciated, No rebound -guarding or rigidity. No Cyanosis, Clubbing or edema, No new Rash or bruise   PERTINENT RADIOLOGIC STUDIES: Dg Chest Portable 1 View  Result Date: 09/19/2016 CLINICAL DATA:  71 year old female with fever, shortness of breath, wheezing. EXAM: PORTABLE CHEST 1 VIEW COMPARISON:  Chest radiograph dated 04/16/2016 FINDINGS: There is shallow inspiration with bibasilar atelectatic changes. Right perihilar density appears more prominent compared to the prior radiograph. A focal infiltrate or adenopathy is not excluded. Correlation with clinical exam and follow-up recommended. There is linear atelectasis/scarring in the periphery of the left lower lung field. No pleural effusion or pneumothorax.  There is mild cardiomegaly. Mild atherosclerotic calcification of the aortic arch. There is degenerative changes of the spine and shoulders with old healed fracture of the left humeral neck. Cervical fixation plate and screws noted. IMPRESSION: 1. Right hilar density may represent focal infiltrate versus adenopathy/mass. Clinical correlation and follow-up recommended. 2. Shallow inspiration with minimal bibasilar atelectasis. Electronically Signed   By: Anner Crete M.D.   On: 09/19/2016 05:30   Dg Chest Port 1v Same Day  Result Date: 09/20/2016 CLINICAL DATA:  Dry cough for 3 days, fever.  Shortness of breath. EXAM: PORTABLE CHEST 1 VIEW COMPARISON:  09/19/2016. FINDINGS: Trachea is midline. Heart size stable. Lungs are low in volume with minimal streaky  volume loss in the lung bases. No definite pleural fluid. Stimulator wires project over the mid thoracic spine. IMPRESSION: Low lung volumes with minimal streaky volume loss in the lung bases. Electronically Signed   By: Lorin Picket M.D.   On: 09/20/2016 09:24     PERTINENT LAB RESULTS: CBC:  Recent Labs  09/20/16 0256  WBC 5.0  HGB 11.7*  HCT 36.5  PLT 163   CMET CMP     Component Value Date/Time   NA 137 09/20/2016 0256   NA 138 02/22/2016 1427   K 4.6 09/20/2016 0256   CL 99 (L) 09/20/2016 0256   CO2 28 09/20/2016 0256   GLUCOSE 182 (H) 09/20/2016 0256   BUN 9 09/20/2016 0256   BUN 19 02/22/2016 1427   CREATININE 0.51 09/20/2016 0256   CALCIUM 9.2 09/20/2016 0256   PROT 7.1 09/19/2016 0406   PROT 7.1 02/22/2016 1427   ALBUMIN 3.4 (L) 09/19/2016 0406   ALBUMIN 3.9 02/22/2016 1427   AST 35 09/19/2016 0406   ALT 26 09/19/2016 0406   ALKPHOS 71 09/19/2016 0406   BILITOT 0.9 09/19/2016 0406   BILITOT 0.4 02/22/2016 1427   GFRNONAA >60 09/20/2016 0256   GFRAA >60 09/20/2016 0256    GFR Estimated Creatinine Clearance: 84.9 mL/min (by C-G formula based on SCr of 0.51 mg/dL). No results for input(s): LIPASE, AMYLASE  in the last 72 hours.  Recent Labs  09/19/16 1219 09/19/16 1528 09/19/16 1835  TROPONINI 0.32* 0.24* 0.19*   Invalid input(s): POCBNP No results for input(s): DDIMER in the last 72 hours. No results for input(s): HGBA1C in the last 72 hours. No results for input(s): CHOL, HDL, LDLCALC, TRIG, CHOLHDL, LDLDIRECT in the last 72 hours. No results for input(s): TSH, T4TOTAL, T3FREE, THYROIDAB in the last 72 hours.  Invalid input(s): FREET3 No results for input(s): VITAMINB12, FOLATE, FERRITIN, TIBC, IRON, RETICCTPCT in the last 72 hours. Coags: No results for input(s): INR in the last 72 hours.  Invalid input(s): PT Microbiology: Recent Results (from the past 240 hour(s))  Veritor Flu A/B Waived     Status: Abnormal   Collection Time: 09/18/16  4:25 PM  Result Value Ref Range Status   Influenza A Positive (A) Negative Final   Influenza B Negative Negative Final    Comment: If the test is negative for the presence of influenza A or influenza B antigen, infection due to influenza cannot be ruled-out because the antigen present in the sample may be below the detection limit of the test. It is recommended that these results be confirmed by viral culture or an FDA-cleared influenza A and B molecular assay.   Microscopic Examination     Status: None   Collection Time: 09/18/16  4:27 PM  Result Value Ref Range Status   WBC, UA 0-5 0 - 5 /hpf Final   RBC, UA 0-2 0 - 2 /hpf Final   Epithelial Cells (non renal) 0-10 0 - 10 /hpf Final   Renal Epithel, UA None seen None seen /hpf Final   Bacteria, UA None seen None seen/Few Final  Blood Culture (routine x 2)     Status: None (Preliminary result)   Collection Time: 09/19/16  4:21 AM  Result Value Ref Range Status   Specimen Description BLOOD RIGHT HAND  Final   Special Requests BOTTLES DRAWN AEROBIC AND ANAEROBIC 5ML  Final   Culture NO GROWTH 2 DAYS  Final   Report Status PENDING  Incomplete  Blood Culture (routine x 2)  Status:  None (Preliminary result)   Collection Time: 09/19/16  4:28 AM  Result Value Ref Range Status   Specimen Description BLOOD LEFT HAND  Final   Special Requests BOTTLES DRAWN AEROBIC AND ANAEROBIC 5ML  Final   Culture NO GROWTH 2 DAYS  Final   Report Status PENDING  Incomplete  Culture, sputum-assessment     Status: None   Collection Time: 09/19/16  8:50 AM  Result Value Ref Range Status   Specimen Description EXPECTORATED SPUTUM  Final   Special Requests NONE  Final   Sputum evaluation THIS SPECIMEN IS ACCEPTABLE FOR SPUTUM CULTURE  Final   Report Status 09/19/2016 FINAL  Final  Culture, respiratory (NON-Expectorated)     Status: None (Preliminary result)   Collection Time: 09/19/16  8:50 AM  Result Value Ref Range Status   Specimen Description EXPECTORATED SPUTUM  Final   Special Requests NONE Reflexed from [id number]  Final   Gram Stain   Final    MODERATE SQUAMOUS EPITHELIAL CELLS PRESENT ABUNDANT GRAM POSITIVE COCCI IN CLUSTERS MODERATE GRAM POSITIVE RODS MODERATE GRAM POSITIVE COCCI IN PAIRS    Culture CULTURE REINCUBATED FOR BETTER GROWTH  Final   Report Status PENDING  Incomplete  MRSA PCR Screening     Status: None   Collection Time: 09/19/16  8:52 AM  Result Value Ref Range Status   MRSA by PCR NEGATIVE NEGATIVE Final    Comment:        The GeneXpert MRSA Assay (FDA approved for NASAL specimens only), is one component of a comprehensive MRSA colonization surveillance program. It is not intended to diagnose MRSA infection nor to guide or monitor treatment for MRSA infections.   Urine culture     Status: None   Collection Time: 09/19/16  6:19 PM  Result Value Ref Range Status   Specimen Description URINE, RANDOM  Final   Special Requests NONE  Final   Culture NO GROWTH  Final   Report Status 09/20/2016 FINAL  Final    FURTHER DISCHARGE INSTRUCTIONS:  Get Medicines reviewed and adjusted: Please take all your medications with you for your next visit with your  Primary MD  Laboratory/radiological data: Please request your Primary MD to go over all hospital tests and procedure/radiological results at the follow up, please ask your Primary MD to get all Hospital records sent to his/her office.  In some cases, they will be blood work, cultures and biopsy results pending at the time of your discharge. Please request that your primary care M.D. goes through all the records of your hospital data and follows up on these results.  Also Note the following: If you experience worsening of your admission symptoms, develop shortness of breath, life threatening emergency, suicidal or homicidal thoughts you must seek medical attention immediately by calling 911 or calling your MD immediately  if symptoms less severe.  You must read complete instructions/literature along with all the possible adverse reactions/side effects for all the Medicines you take and that have been prescribed to you. Take any new Medicines after you have completely understood and accpet all the possible adverse reactions/side effects.   Do not drive when taking Pain medications or sleeping medications (Benzodaizepines)  Do not take more than prescribed Pain, Sleep and Anxiety Medications. It is not advisable to combine anxiety,sleep and pain medications without talking with your primary care practitioner  Special Instructions: If you have smoked or chewed Tobacco  in the last 2 yrs please stop smoking, stop any regular Alcohol  and or any Recreational drug use.  Wear Seat belts while driving.  Please note: You were cared for by a hospitalist during your hospital stay. Once you are discharged, your primary care physician will handle any further medical issues. Please note that NO REFILLS for any discharge medications will be authorized once you are discharged, as it is imperative that you return to your primary care physician (or establish a relationship with a primary care physician if you do  not have one) for your post hospital discharge needs so that they can reassess your need for medications and monitor your lab values.  Total Time spent coordinating discharge including counseling, education and face to face time equals 45 minutes.  SignedOren Binet 09/22/2016 7:54 AM

## 2016-09-24 LAB — CULTURE, BLOOD (ROUTINE X 2)
CULTURE: NO GROWTH
Culture: NO GROWTH

## 2016-09-25 ENCOUNTER — Encounter: Payer: Self-pay | Admitting: Family

## 2016-09-26 ENCOUNTER — Ambulatory Visit (INDEPENDENT_AMBULATORY_CARE_PROVIDER_SITE_OTHER): Payer: Medicare Other | Admitting: Family

## 2016-09-26 ENCOUNTER — Encounter: Payer: Self-pay | Admitting: Family

## 2016-09-26 VITALS — BP 109/74 | HR 101 | Temp 97.0°F | Ht 65.0 in | Wt 255.8 lb

## 2016-09-26 DIAGNOSIS — Z09 Encounter for follow-up examination after completed treatment for conditions other than malignant neoplasm: Secondary | ICD-10-CM | POA: Diagnosis not present

## 2016-09-26 DIAGNOSIS — J189 Pneumonia, unspecified organism: Secondary | ICD-10-CM | POA: Diagnosis not present

## 2016-09-26 DIAGNOSIS — E1165 Type 2 diabetes mellitus with hyperglycemia: Secondary | ICD-10-CM

## 2016-09-26 DIAGNOSIS — F411 Generalized anxiety disorder: Secondary | ICD-10-CM

## 2016-09-26 DIAGNOSIS — J111 Influenza due to unidentified influenza virus with other respiratory manifestations: Secondary | ICD-10-CM

## 2016-09-26 LAB — BAYER DCA HB A1C WAIVED: HB A1C (BAYER DCA - WAIVED): 6.3 % (ref <7.0)

## 2016-09-26 MED ORDER — CLONAZEPAM 0.5 MG PO TABS: 0.5000 mg | ORAL_TABLET | Freq: Two times a day (BID) | ORAL | 1 refills | Status: DC | PRN

## 2016-09-26 MED ORDER — METFORMIN HCL 500 MG PO TABS: 500.0000 mg | ORAL_TABLET | Freq: Two times a day (BID) | ORAL | 3 refills | Status: DC

## 2016-09-26 NOTE — Patient Instructions (Signed)
Community-Acquired Pneumonia, Adult °Pneumonia is an infection of the lungs. There are different types of pneumonia. One type can develop while a person is in a hospital. A different type, called community-acquired pneumonia, develops in people who are not, or have not recently been, in the hospital or other health care facility. °What are the causes? °Pneumonia may be caused by bacteria, viruses, or funguses. Community-acquired pneumonia is often caused by Streptococcus pneumonia bacteria. These bacteria are often passed from one person to another by breathing in droplets from the cough or sneeze of an infected person. °What increases the risk? °The condition is more likely to develop in: °· People who have chronic diseases, such as chronic obstructive pulmonary disease (COPD), asthma, congestive heart failure, cystic fibrosis, diabetes, or kidney disease. °· People who have early-stage or late-stage HIV. °· People who have sickle cell disease. °· People who have had their spleen removed (splenectomy). °· People who have poor dental hygiene. °· People who have medical conditions that increase the risk of breathing in (aspirating) secretions their own mouth and nose. °· People who have a weakened immune system (immunocompromised). °· People who smoke. °· People who travel to areas where pneumonia-causing germs commonly exist. °· People who are around animal habitats or animals that have pneumonia-causing germs, including birds, bats, rabbits, cats, and farm animals. ° °What are the signs or symptoms? °Symptoms of this condition include: °· A dry cough. °· A wet (productive) cough. °· Fever. °· Sweating. °· Chest pain, especially when breathing deeply or coughing. °· Rapid breathing or difficulty breathing. °· Shortness of breath. °· Shaking chills. °· Fatigue. °· Muscle aches. ° °How is this diagnosed? °Your health care provider will take a medical history and perform a physical exam. You may also have other tests,  including: °· Imaging studies of your chest, including X-rays. °· Tests to check your blood oxygen level and other blood gases. °· Other tests on blood, mucus (sputum), fluid around your lungs (pleural fluid), and urine. ° °If your pneumonia is severe, other tests may be done to identify the specific cause of your illness. °How is this treated? °The type of treatment that you receive depends on many factors, such as the cause of your pneumonia, the medicines you take, and other medical conditions that you have. For most adults, treatment and recovery from pneumonia may occur at home. In some cases, treatment must happen in a hospital. Treatment may include: °· Antibiotic medicines, if the pneumonia was caused by bacteria. °· Antiviral medicines, if the pneumonia was caused by a virus. °· Medicines that are given by mouth or through an IV tube. °· Oxygen. °· Respiratory therapy. ° °Although rare, treating severe pneumonia may include: °· Mechanical ventilation. This is done if you are not breathing well on your own and you cannot maintain a safe blood oxygen level. °· Thoracentesis. This procedure removes fluid around one lung or both lungs to help you breathe better. ° °Follow these instructions at home: °· Take over-the-counter and prescription medicines only as told by your health care provider. °? Only take cough medicine if you are losing sleep. Understand that cough medicine can prevent your body’s natural ability to remove mucus from your lungs. °? If you were prescribed an antibiotic medicine, take it as told by your health care provider. Do not stop taking the antibiotic even if you start to feel better. °· Sleep in a semi-upright position at night. Try sleeping in a reclining chair, or place a few pillows under your head. °· Do not use tobacco products, including cigarettes, chewing   tobacco, and e-cigarettes. If you need help quitting, ask your health care provider. °· Drink enough water to keep your urine  clear or pale yellow. This will help to thin out mucus secretions in your lungs. °How is this prevented? °There are ways that you can decrease your risk of developing community-acquired pneumonia. Consider getting a pneumococcal vaccine if: °· You are older than 71 years of age. °· You are older than 71 years of age and are undergoing cancer treatment, have chronic lung disease, or have other medical conditions that affect your immune system. Ask your health care provider if this applies to you. ° °There are different types and schedules of pneumococcal vaccines. Ask your health care provider which vaccination option is best for you. °You may also prevent community-acquired pneumonia if you take these actions: °· Get an influenza vaccine every year. Ask your health care provider which type of influenza vaccine is best for you. °· Go to the dentist on a regular basis. °· Wash your hands often. Use hand sanitizer if soap and water are not available. ° °Contact a health care provider if: °· You have a fever. °· You are losing sleep because you cannot control your cough with cough medicine. °Get help right away if: °· You have worsening shortness of breath. °· You have increased chest pain. °· Your sickness becomes worse, especially if you are an older adult or have a weakened immune system. °· You cough up blood. °This information is not intended to replace advice given to you by your health care provider. Make sure you discuss any questions you have with your health care provider. °Document Released: 07/14/2005 Document Revised: 11/22/2015 Document Reviewed: 11/08/2014 °Elsevier Interactive Patient Education © 2017 Elsevier Inc. ° °

## 2016-09-26 NOTE — Progress Notes (Signed)
   Subjective:    Patient ID: Shirley Leblanc, female    DOB: [birth year], 71 y.o.   MRN: [id number]  HPI PT presents to the office today for hospital follow up. Pt was admitted to the hospital for 5 days for Flu and pneumonia. Pt was started on tamiflu and levofloxacin. PT was also told to follow up with her cardiologists elevated troponin. Negative blood cultures.   Granddaughter is presents and states pt is very weak.  PT also had elevated blood sugars while in the hospital and checking her blood glucose at home. Her average is 250's. PT has been told she is prediabetic.   PT requesting her clonazepam to be restarted today. States she feels anxious.    Review of Systems  Constitutional: Positive for fatigue.  Respiratory: Positive for cough and shortness of breath.   Neurological: Positive for dizziness.  All other systems reviewed and are negative.      Objective:   Physical Exam  Constitutional: She is oriented to person, place, and time. She appears well-developed and well-nourished. She has a sickly appearance.  HENT:  Head: Normocephalic and atraumatic.  Right Ear: External ear normal.  Left Ear: External ear normal.  Cardiovascular: Normal rate, regular rhythm, normal heart sounds and intact distal pulses.   Pulmonary/Chest: Effort normal. She has decreased breath sounds. She has no wheezes.  Musculoskeletal: She exhibits no tenderness.  Using walker   Neurological: She is alert and oriented to person, place, and time.  Skin: Skin is warm and dry.  Psychiatric: She has a normal mood and affect. Her behavior is normal. Judgment and thought content normal.      BP 109/74   Pulse (!) 101   Temp 97 F (36.1 C) (Oral)   Ht _0  (1.651 m)   Wt 255 lb 12.8 oz (116 kg)   BMI 42.57 kg/m      Assessment & Plan:  1. Community acquired pneumonia, unspecified laterality - CMP14+EGFR - CBC with Differential/Platelet  2. Hospital discharge follow-up - CMP14+EGFR - CBC  with Differential/Platelet  3. Influenza - CMP14+EGFR - CBC with Differential/Platelet  4. GAD (generalized anxiety disorder) -Pt restart on Klonopin today - CMP14+EGFR - CBC with Differential/Platelet - clonazePAM (KLONOPIN) 0.5 MG tablet; Take 1 tablet (0.5 mg total) by mouth 2 (two) times daily as needed for anxiety.  Dispense: 20 tablet; Refill: 1  5. Type 2 diabetes mellitus with hyperglycemia, without long-term current use of insulin (HCC) -Pt started on metformin today -Low carb diet -Pt needs to follow up with [name] - CMP14+EGFR - CBC with Differential/Platelet - Bayer DCA Hb A1c Waived - metFORMIN (GLUCOPHAGE) 500 MG tablet; Take 1 tablet (500 mg total) by mouth 2 (two) times daily with a meal.  Dispense: 180 tablet; Refill: 3   Return in 1 week, will wait to do chest x-ray  Long discussion with patient and daughter, if symptoms worsen or any changes in SOB she needs to go to the ED!!   Evelina Dun, FNP

## 2016-09-27 ENCOUNTER — Ambulatory Visit: Payer: Medicare Other

## 2016-09-27 LAB — CMP14+EGFR
ALBUMIN: 3.8 g/dL (ref 3.5–4.8)
ALK PHOS: 71 IU/L (ref 39–117)
ALT: 25 IU/L (ref 0–32)
AST: 19 IU/L (ref 0–40)
Albumin/Globulin Ratio: 1.4 (ref 1.2–2.2)
BUN / CREAT RATIO: 21 (ref 12–28)
BUN: 25 mg/dL (ref 8–27)
Bilirubin Total: 0.5 mg/dL (ref 0.0–1.2)
CALCIUM: 9.8 mg/dL (ref 8.7–10.3)
CO2: 26 mmol/L (ref 18–29)
CREATININE: 1.18 mg/dL — AB (ref 0.57–1.00)
Chloride: 92 mmol/L — ABNORMAL LOW (ref 96–106)
GFR calc Af Amer: 54 mL/min/{1.73_m2} — ABNORMAL LOW (ref >59)
GFR, EST NON AFRICAN AMERICAN: 47 mL/min/{1.73_m2} — AB (ref >59)
GLOBULIN, TOTAL: 2.8 g/dL (ref 1.5–4.5)
GLUCOSE: 241 mg/dL — AB (ref 65–99)
Potassium: 3.6 mmol/L (ref 3.5–5.2)
SODIUM: 137 mmol/L (ref 134–144)
Total Protein: 6.6 g/dL (ref 6.0–8.5)

## 2016-09-27 LAB — CBC WITH DIFFERENTIAL/PLATELET
BASOS: 0 %
Basophils Absolute: 0 10*3/uL (ref 0.0–0.2)
EOS (ABSOLUTE): 0.1 10*3/uL (ref 0.0–0.4)
EOS: 0 %
HEMATOCRIT: 46.8 % — AB (ref 34.0–46.6)
HEMOGLOBIN: 15.8 g/dL (ref 11.1–15.9)
IMMATURE GRANULOCYTES: 2 %
Immature Grans (Abs): 0.5 10*3/uL — ABNORMAL HIGH (ref 0.0–0.1)
LYMPHS ABS: 3.1 10*3/uL (ref 0.7–3.1)
Lymphs: 14 %
MCH: 30.3 pg (ref 26.6–33.0)
MCHC: 33.8 g/dL (ref 31.5–35.7)
MCV: 90 fL (ref 79–97)
MONOCYTES: 5 %
Monocytes Absolute: 1 10*3/uL — ABNORMAL HIGH (ref 0.1–0.9)
NEUTROS PCT: 79 %
Neutrophils Absolute: 17.4 10*3/uL — ABNORMAL HIGH (ref 1.4–7.0)
Platelets: 330 10*3/uL (ref 150–379)
RBC: 5.21 x10E6/uL (ref 3.77–5.28)
RDW: 13.8 % (ref 12.3–15.4)
WBC: 22.1 10*3/uL — AB (ref 3.4–10.8)

## 2016-09-29 ENCOUNTER — Other Ambulatory Visit: Payer: Self-pay | Admitting: Family

## 2016-09-29 ENCOUNTER — Ambulatory Visit: Payer: Medicare Other | Admitting: Nurse Practitioner

## 2016-09-29 DIAGNOSIS — E119 Type 2 diabetes mellitus without complications: Secondary | ICD-10-CM

## 2016-09-29 MED ORDER — LEVOFLOXACIN 500 MG PO TABS: 500.0000 mg | ORAL_TABLET | Freq: Every day | ORAL | 0 refills | Status: DC

## 2016-09-29 MED ORDER — BLOOD GLUCOSE MONITOR KIT: PACK | 0 refills | Status: DC

## 2016-09-29 MED ORDER — GLUCOSE BLOOD VI STRP: ORAL_STRIP | 12 refills | Status: DC

## 2016-09-30 ENCOUNTER — Other Ambulatory Visit: Payer: Self-pay | Admitting: Family

## 2016-09-30 ENCOUNTER — Other Ambulatory Visit: Payer: Self-pay | Admitting: Family Medicine

## 2016-09-30 DIAGNOSIS — E119 Type 2 diabetes mellitus without complications: Secondary | ICD-10-CM

## 2016-09-30 MED ORDER — BLOOD GLUCOSE MONITOR KIT: PACK | 0 refills | Status: DC

## 2016-10-03 ENCOUNTER — Ambulatory Visit (INDEPENDENT_AMBULATORY_CARE_PROVIDER_SITE_OTHER): Payer: Medicare Other

## 2016-10-03 ENCOUNTER — Ambulatory Visit (INDEPENDENT_AMBULATORY_CARE_PROVIDER_SITE_OTHER): Payer: Medicare Other | Admitting: Family

## 2016-10-03 VITALS — BP 123/78 | HR 98 | Temp 97.1°F | Ht 65.0 in | Wt 261.6 lb

## 2016-10-03 DIAGNOSIS — F331 Major depressive disorder, recurrent, moderate: Secondary | ICD-10-CM

## 2016-10-03 DIAGNOSIS — M159 Polyosteoarthritis, unspecified: Secondary | ICD-10-CM | POA: Diagnosis not present

## 2016-10-03 DIAGNOSIS — J189 Pneumonia, unspecified organism: Secondary | ICD-10-CM

## 2016-10-03 DIAGNOSIS — F411 Generalized anxiety disorder: Secondary | ICD-10-CM

## 2016-10-03 DIAGNOSIS — E1165 Type 2 diabetes mellitus with hyperglycemia: Secondary | ICD-10-CM | POA: Diagnosis not present

## 2016-10-03 DIAGNOSIS — F332 Major depressive disorder, recurrent severe without psychotic features: Secondary | ICD-10-CM

## 2016-10-03 LAB — BAYER DCA HB A1C WAIVED: HB A1C (BAYER DCA - WAIVED): 6.6 % (ref <7.0)

## 2016-10-03 MED ORDER — SERTRALINE HCL 100 MG PO TABS: 100.0000 mg | ORAL_TABLET | Freq: Every day | ORAL | 1 refills | Status: DC

## 2016-10-03 MED ORDER — ALPRAZOLAM 0.25 MG PO TABS: 0.2500 mg | ORAL_TABLET | Freq: Two times a day (BID) | ORAL | 1 refills | Status: DC | PRN

## 2016-10-03 MED ORDER — HYDROCODONE-ACETAMINOPHEN 7.5-325 MG PO TABS: 1.0000 | ORAL_TABLET | Freq: Three times a day (TID) | ORAL | 0 refills | Status: DC | PRN

## 2016-10-03 NOTE — Progress Notes (Signed)
Subjective:    Patient ID: Shirley Leblanc, female    DOB: Apr 14, 1946, 71 y.o.   MRN: [id number]  PT presents to the office today to recheck CAP. Pt was diagnosed with flu and pneumonia on 09/22/16. PT was given Levaquin and followed up with me on 09/26/16. PT had a CBC drawn then and it was elevated at 22.1. Pt was restarted on Levaquin.  Pt reports feeling so much better and "like myself". Pt has 2 days left of the levaquin. Denies any SOB or chest pain and states she has an intermittent cough.   PT requesting to stop her Cymbalta and restart her Zoloft. Pt states she feels like she has  Had more "mood swings" and cries more often since switching.   Diabetes  She presents for her follow-up diabetic visit. She has type 2 diabetes mellitus. Her disease course has been worsening. Hypoglycemia symptoms include nervousness/anxiousness. Pertinent negatives for diabetes include no blurred vision, no foot paresthesias and no visual change. Pertinent negatives for diabetic complications include no CVA, heart disease, nephropathy or peripheral neuropathy. Current diabetic treatment includes oral agent (monotherapy). Her breakfast blood glucose range is generally 180-200 mg/dl. Eye exam is not current.     Review of Systems  Eyes: Negative for blurred vision.  Respiratory: Positive for cough.   Psychiatric/Behavioral: The patient is nervous/anxious.   All other systems reviewed and are negative.      Objective:   Physical Exam  Constitutional: She is oriented to person, place, and time. She appears well-developed and well-nourished. No distress.  Morbid obese   HENT:  Head: Normocephalic and atraumatic.  Right Ear: External ear normal.  Left Ear: External ear normal.  Eyes: Pupils are equal, round, and reactive to light.  Neck: Normal range of motion. Neck supple. No thyromegaly present.  Cardiovascular: Normal rate, regular rhythm, normal heart sounds and intact distal pulses.   No murmur  heard. Pulmonary/Chest: Effort normal and breath sounds normal. No respiratory distress. She has no wheezes.  Abdominal: Soft. Bowel sounds are normal. She exhibits no distension. There is no tenderness.  Musculoskeletal: Normal range of motion. She exhibits edema (trace in right foot). She exhibits no tenderness.  Neurological: She is alert and oriented to person, place, and time.  Skin: Skin is warm and dry.  Psychiatric: She has a normal mood and affect. Her behavior is normal. Judgment and thought content normal.  Vitals reviewed.     BP 123/78   Pulse 98   Temp 97.1 F (36.2 C) (Oral)   Ht 5' 5"  (1.651 m)   Wt 261 lb 9.6 oz (118.7 kg)   BMI 43.53 kg/m   Diabetic Foot Exam - Simple   Simple Foot Form Diabetic Foot exam was performed with the following findings:  Yes 10/03/2016 12:01 PM  Visual Inspection No deformities, no ulcerations, no other skin breakdown bilaterally:  Yes Sensation Testing Intact to touch and monofilament testing bilaterally:  Yes Pulse Check Posterior Tibialis and Dorsalis pulse intact bilaterally:  Yes Comments        Assessment & Plan:  1. Community acquired pneumonia, unspecified laterality - CMP14+EGFR - CBC with Differential/Platelet - DG Chest 2 View; Future  2. Type 2 diabetes mellitus with hyperglycemia, without long-term current use of insulin (HCC) -Low carb diet discussed -HgbA1C pending - CMP14+EGFR - CBC with Differential/Platelet - Microalbumin / creatinine urine ratio - Bayer DCA Hb A1c Waived - Ambulatory referral to Ophthalmology  3. Severe episode of recurrent major depressive  disorder, without psychotic features ([place]) -Cymbalta changed to zoloft today -Stress management discussed - CMP14+EGFR - CBC with Differential/Platelet  4. Moderate episode of recurrent major depressive disorder (HCC) -Cymbalta changed to zoloft today -Stress management discussed - sertraline (ZOLOFT) 100 MG tablet; Take 1 tablet (100 mg  total) by mouth daily.  Dispense: 90 tablet; Refill: 1 - ALPRAZolam (XANAX) 0.25 MG tablet; Take 1 tablet (0.25 mg total) by mouth 2 (two) times daily as needed for anxiety.  Dispense: 60 tablet; Refill: 1  5. GAD (generalized anxiety disorder) -Klonopin 0.5 mg changed to xanax 0.25 mg BID per pt request Decrease dose - ALPRAZolam (XANAX) 0.25 MG tablet; Take 1 tablet (0.25 mg total) by mouth 2 (two) times daily as needed for anxiety.  Dispense: 60 tablet; Refill: 1  6. Osteoarthritis of multiple joints, unspecified osteoarthritis type -Will refill pt's pain medication today Pt will schedule pain appt next week with me. She currently is going to Pain Clinic every month, but states it is getting harder to make to it her appt.  - HYDROcodone-acetaminophen (NORCO) 7.5-325 MG tablet; Take 1 tablet by mouth every 8 (eight) hours as needed for moderate pain.  Dispense: 90 tablet; Refill: 0   Continue all meds Labs pending Health Maintenance reviewed Diet and exercise encouraged RTO 1 week for pain management appt  Evelina Dun, FNP

## 2016-10-03 NOTE — Patient Instructions (Signed)
Diabetes Mellitus and Food It is important for you to manage your blood sugar (glucose) level. Your blood glucose level can be greatly affected by what you eat. Eating healthier foods in the appropriate amounts throughout the day at about the same time each day will help you control your blood glucose level. It can also help slow or prevent worsening of your diabetes mellitus. Healthy eating may even help you improve the level of your blood pressure and reach or maintain a healthy weight. General recommendations for healthful eating and cooking habits include:  Eating meals and snacks regularly. Avoid going long periods of time without eating to lose weight.  Eating a diet that consists mainly of plant-based foods, such as fruits, vegetables, nuts, legumes, and whole grains.  Using low-heat cooking methods, such as baking, instead of high-heat cooking methods, such as deep frying.  Work with your dietitian to make sure you understand how to use the Nutrition Facts information on food labels. How can food affect me? Carbohydrates Carbohydrates affect your blood glucose level more than any other type of food. Your dietitian will help you determine how many carbohydrates to eat at each meal and teach you how to count carbohydrates. Counting carbohydrates is important to keep your blood glucose at a healthy level, especially if you are using insulin or taking certain medicines for diabetes mellitus. Alcohol Alcohol can cause sudden decreases in blood glucose (hypoglycemia), especially if you use insulin or take certain medicines for diabetes mellitus. Hypoglycemia can be a life-threatening condition. Symptoms of hypoglycemia (sleepiness, dizziness, and disorientation) are similar to symptoms of having too much alcohol. If your health care provider has given you approval to drink alcohol, do so in moderation and use the following guidelines:  Women should not have more than one drink per day, and men  should not have more than two drinks per day. One drink is equal to: ? 12 oz of beer. ? 5 oz of wine. ? 1 oz of hard liquor.  Do not drink on an empty stomach.  Keep yourself hydrated. Have water, diet soda, or unsweetened iced tea.  Regular soda, juice, and other mixers might contain a lot of carbohydrates and should be counted.  What foods are not recommended? As you make food choices, it is important to remember that all foods are not the same. Some foods have fewer nutrients per serving than other foods, even though they might have the same number of calories or carbohydrates. It is difficult to get your body what it needs when you eat foods with fewer nutrients. Examples of foods that you should avoid that are high in calories and carbohydrates but low in nutrients include:  Trans fats (most processed foods list trans fats on the Nutrition Facts label).  Regular soda.  Juice.  Candy.  Sweets, such as cake, pie, doughnuts, and cookies.  Fried foods.  What foods can I eat? Eat nutrient-rich foods, which will nourish your body and keep you healthy. The food you should eat also will depend on several factors, including:  The calories you need.  The medicines you take.  Your weight.  Your blood glucose level.  Your blood pressure level.  Your cholesterol level.  You should eat a variety of foods, including:  Protein. ? Lean cuts of meat. ? Proteins low in saturated fats, such as fish, egg whites, and beans. Avoid processed meats.  Fruits and vegetables. ? Fruits and vegetables that may help control blood glucose levels, such as apples,   mangoes, and yams.  Dairy products. ? Choose fat-free or low-fat dairy products, such as milk, yogurt, and cheese.  Grains, bread, pasta, and rice. ? Choose whole grain products, such as multigrain bread, whole oats, and brown rice. These foods may help control blood pressure.  Fats. ? Foods containing healthful fats, such as  nuts, avocado, olive oil, canola oil, and fish.  Does everyone with diabetes mellitus have the same meal plan? Because every person with diabetes mellitus is different, there is not one meal plan that works for everyone. It is very important that you meet with a dietitian who will help you create a meal plan that is just right for you. This information is not intended to replace advice given to you by your health care provider. Make sure you discuss any questions you have with your health care provider. Document Released: 04/10/2005 Document Revised: 12/20/2015 Document Reviewed: 06/10/2013 Elsevier Interactive Patient Education  2017 Elsevier Inc.  

## 2016-10-04 LAB — CMP14+EGFR
A/G RATIO: 1.5 (ref 1.2–2.2)
ALT: 18 IU/L (ref 0–32)
AST: 16 IU/L (ref 0–40)
Albumin: 3.8 g/dL (ref 3.5–4.8)
Alkaline Phosphatase: 72 IU/L (ref 39–117)
BUN/Creatinine Ratio: 21 (ref 12–28)
BUN: 15 mg/dL (ref 8–27)
Bilirubin Total: 0.5 mg/dL (ref 0.0–1.2)
CO2: 24 mmol/L (ref 18–29)
CREATININE: 0.73 mg/dL (ref 0.57–1.00)
Calcium: 9.8 mg/dL (ref 8.7–10.3)
Chloride: 94 mmol/L — ABNORMAL LOW (ref 96–106)
GFR, EST AFRICAN AMERICAN: 96 mL/min/{1.73_m2} (ref >59)
GFR, EST NON AFRICAN AMERICAN: 83 mL/min/{1.73_m2} (ref >59)
Globulin, Total: 2.5 g/dL (ref 1.5–4.5)
Glucose: 137 mg/dL — ABNORMAL HIGH (ref 65–99)
POTASSIUM: 3.7 mmol/L (ref 3.5–5.2)
Sodium: 139 mmol/L (ref 134–144)
TOTAL PROTEIN: 6.3 g/dL (ref 6.0–8.5)

## 2016-10-04 LAB — CBC WITH DIFFERENTIAL/PLATELET
BASOS: 0 %
Basophils Absolute: 0 10*3/uL (ref 0.0–0.2)
EOS (ABSOLUTE): 0.2 10*3/uL (ref 0.0–0.4)
EOS: 1 %
HEMOGLOBIN: 14.6 g/dL (ref 11.1–15.9)
Hematocrit: 43.2 % (ref 34.0–46.6)
IMMATURE GRANS (ABS): 0.1 10*3/uL (ref 0.0–0.1)
IMMATURE GRANULOCYTES: 1 %
LYMPHS: 24 %
Lymphocytes Absolute: 4.1 10*3/uL — ABNORMAL HIGH (ref 0.7–3.1)
MCH: 30.7 pg (ref 26.6–33.0)
MCHC: 33.8 g/dL (ref 31.5–35.7)
MCV: 91 fL (ref 79–97)
MONOCYTES: 4 %
Monocytes Absolute: 0.7 10*3/uL (ref 0.1–0.9)
NEUTROS ABS: 11.5 10*3/uL — AB (ref 1.4–7.0)
Neutrophils: 70 %
Platelets: 249 10*3/uL (ref 150–379)
RBC: 4.76 x10E6/uL (ref 3.77–5.28)
RDW: 13.5 % (ref 12.3–15.4)
WBC: 16.7 10*3/uL — ABNORMAL HIGH (ref 3.4–10.8)

## 2016-10-04 LAB — THYROID PANEL WITH TSH
FREE THYROXINE INDEX: 2 (ref 1.2–4.9)
T3 Uptake Ratio: 27 % (ref 24–39)
T4 TOTAL: 7.5 ug/dL (ref 4.5–12.0)
TSH: 3.32 u[IU]/mL (ref 0.450–4.500)

## 2016-10-04 LAB — MICROALBUMIN / CREATININE URINE RATIO
CREATININE, UR: 122.4 mg/dL
MICROALBUM., U, RANDOM: 9.6 ug/mL
Microalb/Creat Ratio: 7.8 mg/g creat (ref 0.0–30.0)

## 2016-10-06 ENCOUNTER — Other Ambulatory Visit: Payer: Self-pay | Admitting: Family

## 2016-10-06 ENCOUNTER — Other Ambulatory Visit: Payer: Self-pay

## 2016-10-06 DIAGNOSIS — D72829 Elevated white blood cell count, unspecified: Secondary | ICD-10-CM

## 2016-10-08 ENCOUNTER — Other Ambulatory Visit: Payer: Self-pay

## 2016-10-08 MED ORDER — RISPERIDONE 1 MG PO TABS: 1.0000 mg | ORAL_TABLET | Freq: Every day | ORAL | 1 refills | Status: DC

## 2016-10-10 ENCOUNTER — Ambulatory Visit (INDEPENDENT_AMBULATORY_CARE_PROVIDER_SITE_OTHER): Payer: Medicare Other | Admitting: Family

## 2016-10-10 DIAGNOSIS — Z0289 Encounter for other administrative examinations: Secondary | ICD-10-CM

## 2016-10-10 DIAGNOSIS — M545 Low back pain, unspecified: Secondary | ICD-10-CM

## 2016-10-10 DIAGNOSIS — F1120 Opioid dependence, uncomplicated: Secondary | ICD-10-CM | POA: Insufficient documentation

## 2016-10-10 DIAGNOSIS — G8929 Other chronic pain: Secondary | ICD-10-CM | POA: Insufficient documentation

## 2016-10-10 DIAGNOSIS — M159 Polyosteoarthritis, unspecified: Secondary | ICD-10-CM

## 2016-10-10 DIAGNOSIS — M549 Dorsalgia, unspecified: Secondary | ICD-10-CM

## 2016-10-10 MED ORDER — HYDROCODONE-ACETAMINOPHEN 7.5-325 MG PO TABS
1.0000 | ORAL_TABLET | Freq: Three times a day (TID) | ORAL | 0 refills | Status: DC | PRN
Start: 2016-10-10 — End: 2017-01-12

## 2016-10-10 MED ORDER — HYDROCODONE-ACETAMINOPHEN 7.5-325 MG PO TABS
1.0000 | ORAL_TABLET | Freq: Three times a day (TID) | ORAL | 0 refills | Status: DC | PRN
Start: 2016-10-10 — End: 2016-11-12

## 2016-10-10 MED ORDER — HYDROCODONE-ACETAMINOPHEN 5-325 MG PO TABS: 1.0000 | ORAL_TABLET | Freq: Three times a day (TID) | ORAL | 0 refills | Status: DC | PRN

## 2016-10-10 NOTE — Progress Notes (Signed)
[place] Controlled Substance Abuse database reviewed- Yes If yes- were their any concerning findings : Pt has been seeing Pain management and has switched to me as her PCP.  Depression screen [facility] 2/9 10/10/2016 10/03/2016 09/26/2016 09/18/2016 05/06/2016  Decreased Interest 1 1 3 2 3   Down, Depressed, Hopeless 1 1 3 3 3   PHQ - 2 Score 2 2 6 5 6   Altered sleeping 1 1 2 3 3   Tired, decreased energy 1 1 3 3 3   Change in appetite 1 1 3 3 3   Feeling bad or failure about yourself  1 1 3 3 3   Trouble concentrating 1 1 3 3 3   Moving slowly or fidgety/restless 1 1 3 3 3   Suicidal thoughts 0 0 2 1 3   PHQ-9 Score 8 8 25 24 27   Difficult doing work/chores - - - Extremely dIfficult Very difficult    GAD 7 : Generalized Anxiety Score 03/10/2016 02/22/2016  Nervous, Anxious, on Edge 3 1  Control/stop worrying 3 3  Worry too much - different things 3 3  Trouble relaxing 0 0  Restless 3 0  Easily annoyed or irritable 1 3  Afraid - awful might happen 0 2  Total GAD 7 Score 13 12  Anxiety Difficulty Not difficult at all Not difficult at all       [place] drug screen performed- Yes  SOAPP  0= never  1= seldom  2=sometimes  3= often  4= very often  How often do you have mood swings? 2 How often do you smoke a cigarette within an hour after waling up? 0 How often have you taken medication other than the way that it was prescribed?1 How often have you used illegal drugs in the past 5 years? 0 How often, in your lifetime, have you had legal problems or been arrested? 0  Score 3  Alcohol Audit - How often during the last year have found that you: 0-Never   1- Less than monthly   2- Monthly     3-Weekly     4-daily or almost daily  - found that you were not able to stop drinking once you started- 0 -failed to do what was normally expected of you because of drinking- 0 -needed a first drink in the morning- 0 -had a feeling of guilt or remorse after drinking- 0 -are/were unable to remember  what happened the night before because of your drinking- 0  0- NO   2- yes but not in last year  4- yes during last year -Have you or someone else been injured because of your drinking- 0 - Has anyone been concerned about your drinking or suggested you cut down- 0        TOTAL- 0  ( 0-7- alcohol education, 8-15- simple advice, 16-19 simple advice plus counseling, 20-40 referral for evaluation and treatment 0   Designated Pharmacy-[facility],   Pain assessment: Cause of pain- Chronic back pain and osteoarthritis  Pain location-Lower right back Pain on scale of 1-10- 1 Frequency- intermitten What increases pain- Standing What makes pain Better-rest and pain medication  Current treatments- Norco 7.5-325 mg  Every 8 hours as needed #90  Pain management agreement reviewed and signed- Yes

## 2016-10-10 NOTE — Patient Instructions (Signed)
Chronic Back Pain When back pain lasts longer than 3 months, it is called chronic back pain.The cause of your back pain may not be known. Some common causes include:  Wear and tear (degenerative disease) of the bones, ligaments, or disks in your back.  Inflammation and stiffness in your back (arthritis). People who have chronic back pain often go through certain periods in which the pain is more intense (flare-ups). Many people can learn to manage the pain with home care. Follow these instructions at home: Pay attention to any changes in your symptoms. Take these actions to help with your pain: Activity   Avoid bending and activities that make the problem worse.  Do not sit or stand in one place for long periods of time.  Take brief periods of rest throughout the day. This will reduce your pain. Resting in a lying or standing position is usually better than sitting to rest.  When you are resting for longer periods, mix in some mild activity or stretching between periods of rest. This will help to prevent stiffness and pain.  Get regular exercise. Ask your health care provider what activities are safe for you.  Do not lift anything that is heavier than 10 lb (4.5 kg). Always use proper lifting technique, which includes:  Bending your knees.  Keeping the load close to your body.  Avoiding twisting. Managing pain   If directed, apply ice to the painful area. Your health care provider may recommend applying ice during the first 24-48 hours after a flare-up begins.  Put ice in a plastic bag.  Place a towel between your skin and the bag.  Leave the ice on for 20 minutes, 2-3 times per day.  After icing, apply heat to the affected area as often as told by your health care provider. Use the heat source that your health care provider recommends, such as a moist heat pack or a heating pad.  Place a towel between your skin and the heat source.  Leave the heat on for 20-30  minutes.  Remove the heat if your skin turns bright red. This is especially important if you are unable to feel pain, heat, or cold. You may have a greater risk of getting burned.  Try soaking in a warm tub.  Take over-the-counter and prescription medicines only as told by your health care provider.  Keep all follow-up visits as told by your health care provider. This is important. Contact a health care provider if:  You have pain that is not relieved with rest or medicine. Get help right away if:  You have weakness or numbness in one or both of your legs or feet.  You have trouble controlling your bladder or your bowels.  You have nausea or vomiting.  You have pain in your abdomen.  You have shortness of breath or you faint. This information is not intended to replace advice given to you by your health care provider. Make sure you discuss any questions you have with your health care provider. Document Released: 08/21/2004 Document Revised: 11/22/2015 Document Reviewed: 01/01/2015 Elsevier Interactive Patient Education  2017 Elsevier Inc.  

## 2016-10-14 LAB — TOXASSURE SELECT 13 (MW), URINE

## 2016-10-27 ENCOUNTER — Ambulatory Visit: Payer: Medicare Other | Admitting: Pharmacist

## 2016-10-28 ENCOUNTER — Encounter: Payer: Self-pay | Admitting: Cardiology

## 2016-10-30 ENCOUNTER — Encounter: Payer: Self-pay | Admitting: Family

## 2016-10-31 ENCOUNTER — Other Ambulatory Visit: Payer: Self-pay | Admitting: Family

## 2016-10-31 MED ORDER — LEVOTHYROXINE SODIUM 50 MCG PO TABS: 50.0000 ug | ORAL_TABLET | Freq: Every day | ORAL | 1 refills | Status: DC

## 2016-11-03 ENCOUNTER — Telehealth: Payer: Self-pay | Admitting: *Deleted

## 2016-11-03 ENCOUNTER — Other Ambulatory Visit: Payer: Self-pay | Admitting: Family

## 2016-11-03 MED ORDER — SERTRALINE HCL 50 MG PO TABS: 150.0000 mg | ORAL_TABLET | Freq: Every day | ORAL | 1 refills | Status: DC

## 2016-11-03 NOTE — Telephone Encounter (Signed)
Patient aware to discontinue the risperdal and increase zoloft to 150 per [facility]

## 2016-11-03 NOTE — Progress Notes (Signed)
We will discontinue  Risperdal 1mg  and increase Zoloft to 150mg .

## 2016-11-04 ENCOUNTER — Other Ambulatory Visit: Payer: Self-pay | Admitting: Family

## 2016-11-04 DIAGNOSIS — M545 Low back pain, unspecified: Secondary | ICD-10-CM

## 2016-11-04 DIAGNOSIS — Z0289 Encounter for other administrative examinations: Secondary | ICD-10-CM

## 2016-11-04 DIAGNOSIS — M159 Polyosteoarthritis, unspecified: Secondary | ICD-10-CM

## 2016-11-04 DIAGNOSIS — F1120 Opioid dependence, uncomplicated: Secondary | ICD-10-CM

## 2016-11-04 DIAGNOSIS — G8929 Other chronic pain: Secondary | ICD-10-CM

## 2016-11-04 MED ORDER — QUINAPRIL HCL 40 MG PO TABS: 40.0000 mg | ORAL_TABLET | Freq: Every day | ORAL | 1 refills | Status: DC

## 2016-11-04 MED ORDER — ATORVASTATIN CALCIUM 10 MG PO TABS: 10.0000 mg | ORAL_TABLET | Freq: Every day | ORAL | 1 refills | Status: DC

## 2016-11-09 NOTE — Progress Notes (Signed)
Cardiology Office Note   Date:  11/13/2016   ID:  COURTNY BENNISON, DOB 02/22/1946, MRN [id number]  PCP:  Evelina Dun, FNP  Cardiologist:   Minus Breeding, MD   Chief Complaint  Patient presents with  . Atrial Fibrillation      History of Present Illness: Shirley Leblanc is a 71 y.o. female who presents for follow-up of paroxysmal atrial fibrillation and an elevated troponin during hospitalization earlier this year.  She was in the hospital in Feb with CAP.  I reviewed these records for this visit.  During this admission for respiratory failure she did have an elevated troponin.  She thinks her breathing is back to baseline. She gets around with a walker. She is limited by some joint problems. She's not having any acute shortness of breath, PND or orthopnea. She denies any chest pressure, neck or arm discomfort. She's not noticing any palpitations, presyncope or syncope. She has lost 20 pounds and she was in the hospital.  Past Medical History:  Diagnosis Date  . Bursitis   . Depression   . DJD (degenerative joint disease)   . GERD (gastroesophageal reflux disease)   . Hyperlipemia   . Hypertension   . Obesities, morbid ([place])   . Urinary incontinence     Past Surgical History:  Procedure Laterality Date  . CARDIAC CATHETERIZATION    . CHOLECYSTECTOMY N/A 05/14/2015   Procedure: LAPAROSCOPIC CHOLECYSTECTOMY;  Surgeon: Coralie Keens, MD;  Location: [facility];  Service: General;  Laterality: N/A;  . NECK SURGERY    . TOTAL KNEE ARTHROPLASTY     x2  . VAGINAL HYSTERECTOMY       Current Outpatient Prescriptions  Medication Sig Dispense Refill  . Acetaminophen (TYLENOL PO) Take 2 tablets by mouth daily as needed (genetal pain).    Marland Kitchen albuterol (PROVENTIL HFA;VENTOLIN HFA) 108 (90 Base) MCG/ACT inhaler Inhale 2 puffs into the lungs every 6 (six) hours as needed for wheezing or shortness of breath. 1 Inhaler 0  . ALPRAZolam (XANAX) 0.25 MG tablet Take 1 tablet (0.25 mg total) by  mouth 2 (two) times daily as needed for anxiety. 60 tablet 1  . aspirin 81 MG EC tablet Take 81 mg by mouth daily.      Marland Kitchen atorvastatin (LIPITOR) 10 MG tablet Take 1 tablet (10 mg total) by mouth daily. 90 tablet 1  . Cholecalciferol (VITAMIN D3) 2000 UNITS TABS Take 2,000 Units by mouth daily.     . furosemide (LASIX) 20 MG tablet Take 1 tablet (20 mg total) by mouth daily. 30 tablet 0  . HYDROcodone-acetaminophen (NORCO) 7.5-325 MG tablet Take 1 tablet by mouth every 8 (eight) hours as needed for moderate pain. 90 tablet 0  . levothyroxine (SYNTHROID, LEVOTHROID) 50 MCG tablet Take 1 tablet (50 mcg total) by mouth daily. 90 tablet 1  . metFORMIN (GLUCOPHAGE) 500 MG tablet Take 1 tablet (500 mg total) by mouth 2 (two) times daily with a meal. 180 tablet 3  . quinapril (ACCUPRIL) 40 MG tablet Take 1 tablet (40 mg total) by mouth daily. 90 tablet 1  . sertraline (ZOLOFT) 50 MG tablet Take 3 tablets (150 mg total) by mouth daily. 270 tablet 1  . blood glucose meter kit and supplies KIT Dispense per insurance preference. Use up to four times daily . E 11.9 1 each 0  . glucose blood (GLUCOSE METER TEST) test strip Use BID 100 each 12   No current facility-administered medications for this visit.  Allergies:   Contrast media [iodinated diagnostic agents]; Penicillins; Sulfa antibiotics; and Morphine and related    ROS:  Please see the history of present illness.   Otherwise, review of systems are positive for none.   All other systems are reviewed and negative.    PHYSICAL EXAM: VS:  BP 126/89   Pulse 96   Ht _0  (1.676 m)   Wt 250 lb (113.4 kg)   BMI 40.35 kg/m  , BMI Body mass index is 40.35 kg/m. GENERAL:  Well appearing and in no distress NECK:  No jugular venous distention, waveform within normal limits, carotid upstroke brisk and symmetric, no bruits, no thyromegaly LUNGS:  Clear to auscultation bilaterally BACK:  No CVA tenderness CHEST:  Unremarkable HEART:  PMI not  displaced or sustained,S1 and S2 within normal limits, no S3, no S4, no clicks, no rubs, no murmurs ABD:  Flat, positive bowel sounds normal in frequency in pitch, no bruits, no rebound, no guarding, no midline pulsatile mass, no hepatomegaly, no splenomegaly, morbidly obese EXT:  2 plus pulses throughout, no edema, no cyanosis no clubbing   EKG:  EKG is not  ordered today.   Recent Labs: 09/19/2016: B Natriuretic Peptide 44.8 09/20/2016: Hemoglobin 11.7 10/03/2016: ALT 18; BUN 15; Creatinine, Ser 0.73; Platelets 249; Potassium 3.7; Sodium 139; TSH 3.320    Lipid Panel    Component Value Date/Time   CHOL 196 02/22/2016 1427   TRIG 206 (H) 02/22/2016 1427   HDL 49 02/22/2016 1427   CHOLHDL 4.0 02/22/2016 1427   CHOLHDL 4.9 11/18/2010 0655   VLDL 33 11/18/2010 0655   LDLCALC 106 (H) 02/22/2016 1427      Wt Readings from Last 3 Encounters:  11/12/16 250 lb (113.4 kg)  10/10/16 260 lb 6.4 oz (118.1 kg)  10/03/16 261 lb 9.6 oz (118.7 kg)      Other studies Reviewed: Additional studies/ records that were reviewed today include: Hospital records and old [place].. Review of the above records demonstrates:  See above and below.   ASSESSMENT AND PLAN:  Elevated troponin:  She hs had no chest pain since her [place] which was negative in 2016.  She had a normal echo at the time of the CAP this year.  I reviewed the hospital records and the trop was mildly elevated and flat.  I don't think that further testing is indicated.  She will let me know if she has any symptoms going forwarrd.  PAF:   She has had no symptomatic recurrence of this even in the hospital with flu and CAP.  CHADS2VASC is 3 .   She does not like taking anticoagulation. She understands the risk of stroke if she should be having a symptomatic paroxysms. At this point no change in therapy is planned  Hypertension:    Her blood pressure is well controlled.  No change in therapy is planed  Obesity:  She  has lost 20 lbs and I encourage more of the same.   Current medicines are reviewed at length with the patient today.  The patient does not have concerns regarding medicines.  The following changes have been made:  None  Labs/ tests ordered today include:  None No orders of the defined types were placed in this encounter.    Disposition:   FU with me as needed.   Signed, Minus Breeding, MD  11/13/2016 8:03 AM    [facility]

## 2016-11-12 ENCOUNTER — Encounter: Payer: Self-pay | Admitting: Cardiology

## 2016-11-12 ENCOUNTER — Ambulatory Visit (INDEPENDENT_AMBULATORY_CARE_PROVIDER_SITE_OTHER): Payer: Medicare Other | Admitting: Cardiology

## 2016-11-12 VITALS — BP 126/89 | HR 96 | Ht 66.0 in | Wt 250.0 lb

## 2016-11-12 DIAGNOSIS — R7989 Other specified abnormal findings of blood chemistry: Principal | ICD-10-CM

## 2016-11-12 DIAGNOSIS — R778 Other specified abnormalities of plasma proteins: Secondary | ICD-10-CM

## 2016-11-12 DIAGNOSIS — R748 Abnormal levels of other serum enzymes: Secondary | ICD-10-CM

## 2016-11-12 DIAGNOSIS — I480 Paroxysmal atrial fibrillation: Secondary | ICD-10-CM

## 2016-11-12 DIAGNOSIS — I10 Essential (primary) hypertension: Secondary | ICD-10-CM

## 2016-11-12 NOTE — Patient Instructions (Signed)
Medication Instructions:  The current medical regimen is effective;  continue present plan and medications.  Follow-Up: Follow up as needed with Dr Hochrein.  If you need a refill on your cardiac medications before your next appointment, please call your pharmacy.  Thank you for choosing [facility]!!       

## 2016-11-13 ENCOUNTER — Encounter: Payer: Self-pay | Admitting: Cardiology

## 2016-11-13 DIAGNOSIS — R7989 Other specified abnormal findings of blood chemistry: Principal | ICD-10-CM | POA: Insufficient documentation

## 2016-11-13 DIAGNOSIS — R778 Other specified abnormalities of plasma proteins: Secondary | ICD-10-CM | POA: Insufficient documentation

## 2016-11-17 ENCOUNTER — Ambulatory Visit: Payer: Medicare Other | Admitting: Pharmacist

## 2016-11-19 ENCOUNTER — Other Ambulatory Visit: Payer: Self-pay | Admitting: *Deleted

## 2016-11-19 NOTE — Telephone Encounter (Signed)
Risperidone is not on current med list - last seen christy 10/10/2016. ?? Should she be on med ?

## 2016-11-24 ENCOUNTER — Other Ambulatory Visit: Payer: Self-pay | Admitting: Family

## 2016-11-24 ENCOUNTER — Other Ambulatory Visit: Payer: Self-pay | Admitting: *Deleted

## 2016-11-24 MED ORDER — BUSPIRONE HCL 5 MG PO TABS: 5.0000 mg | ORAL_TABLET | Freq: Two times a day (BID) | ORAL | 1 refills | Status: DC

## 2016-11-24 NOTE — Progress Notes (Signed)
Pt wanted [facility] changed per pt request Okayed per [facility]

## 2016-11-24 NOTE — Progress Notes (Signed)
Will add Buspar 5 mg BID to help anxiety. PT can take with her xanax.

## 2016-11-28 ENCOUNTER — Other Ambulatory Visit: Payer: Self-pay | Admitting: *Deleted

## 2016-11-30 ENCOUNTER — Other Ambulatory Visit: Payer: Self-pay | Admitting: Family

## 2016-11-30 DIAGNOSIS — F331 Major depressive disorder, recurrent, moderate: Secondary | ICD-10-CM

## 2016-11-30 DIAGNOSIS — F411 Generalized anxiety disorder: Secondary | ICD-10-CM

## 2016-12-02 ENCOUNTER — Encounter: Payer: Self-pay | Admitting: Family

## 2016-12-02 NOTE — Telephone Encounter (Signed)
Rx called in 

## 2016-12-12 IMAGING — DX DG CHEST 2V
2 series · 2 of 2 positions shown · non-contrast
Comparison: 01/25/2016

CLINICAL DATA: Shortness of breath

EXAM:
CHEST  2 VIEW

[chest pa]
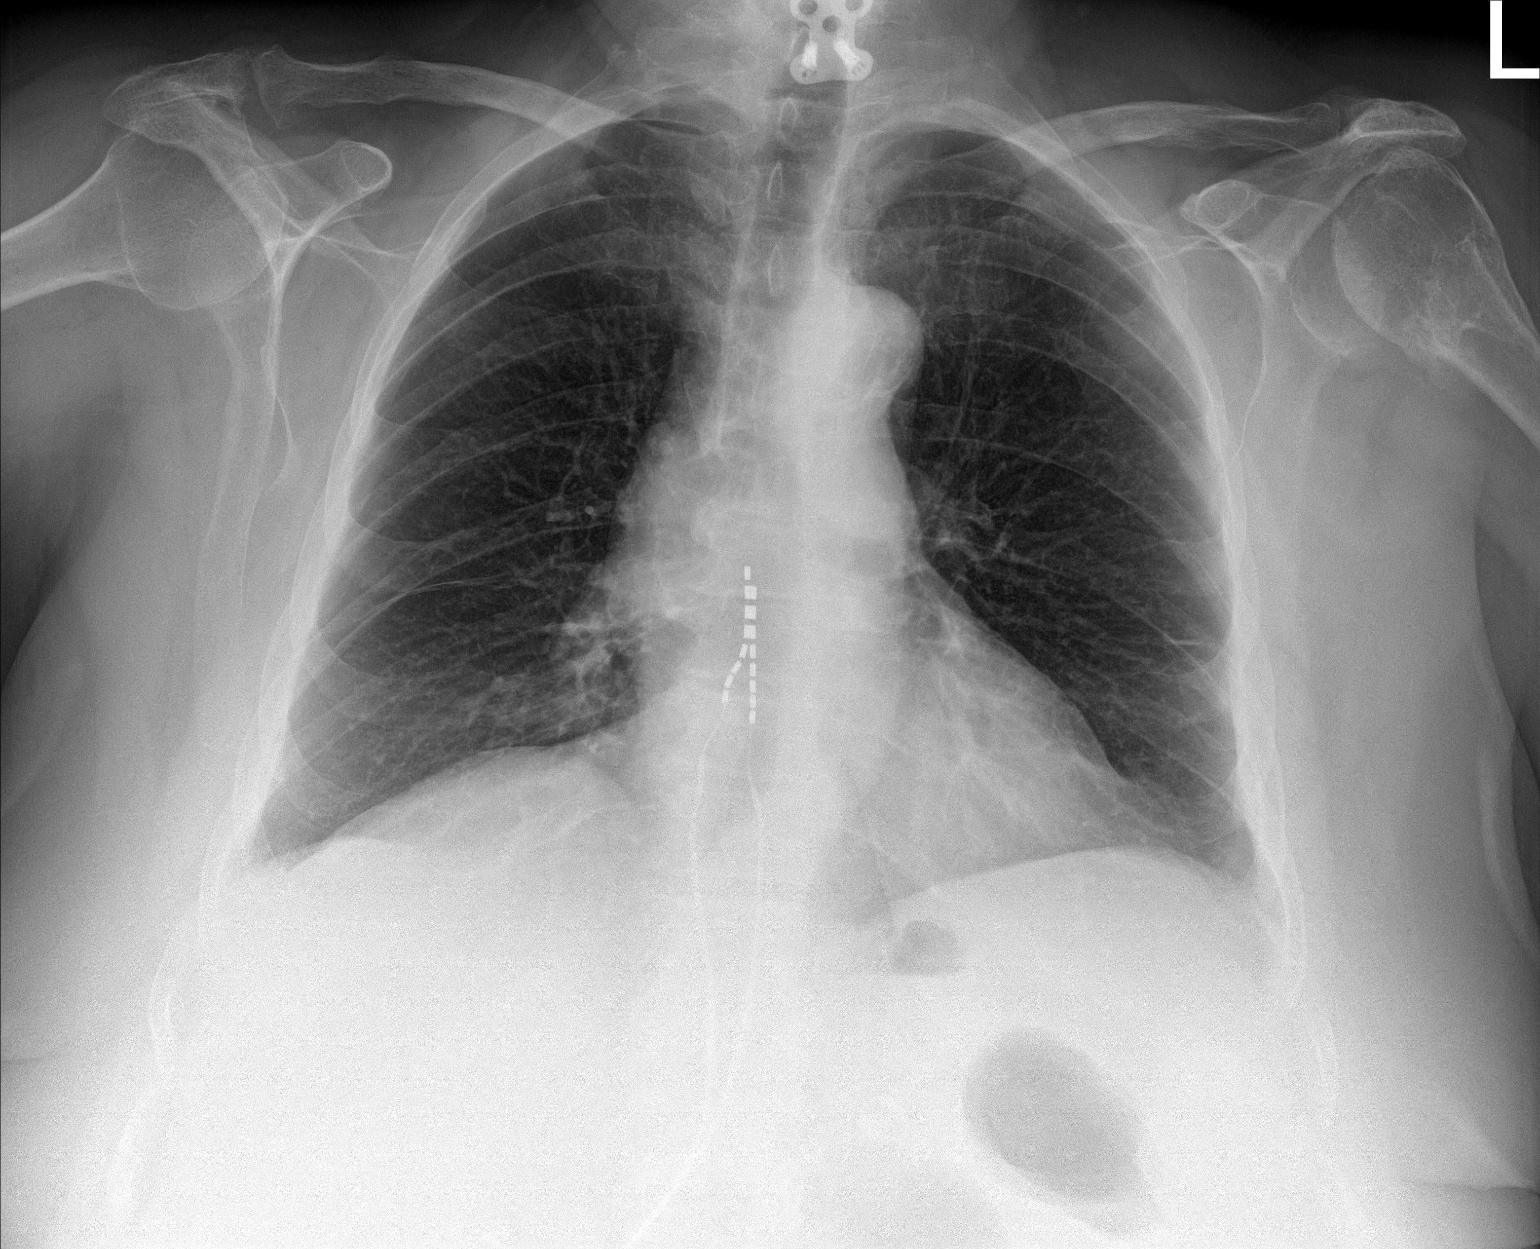

[chest lat]
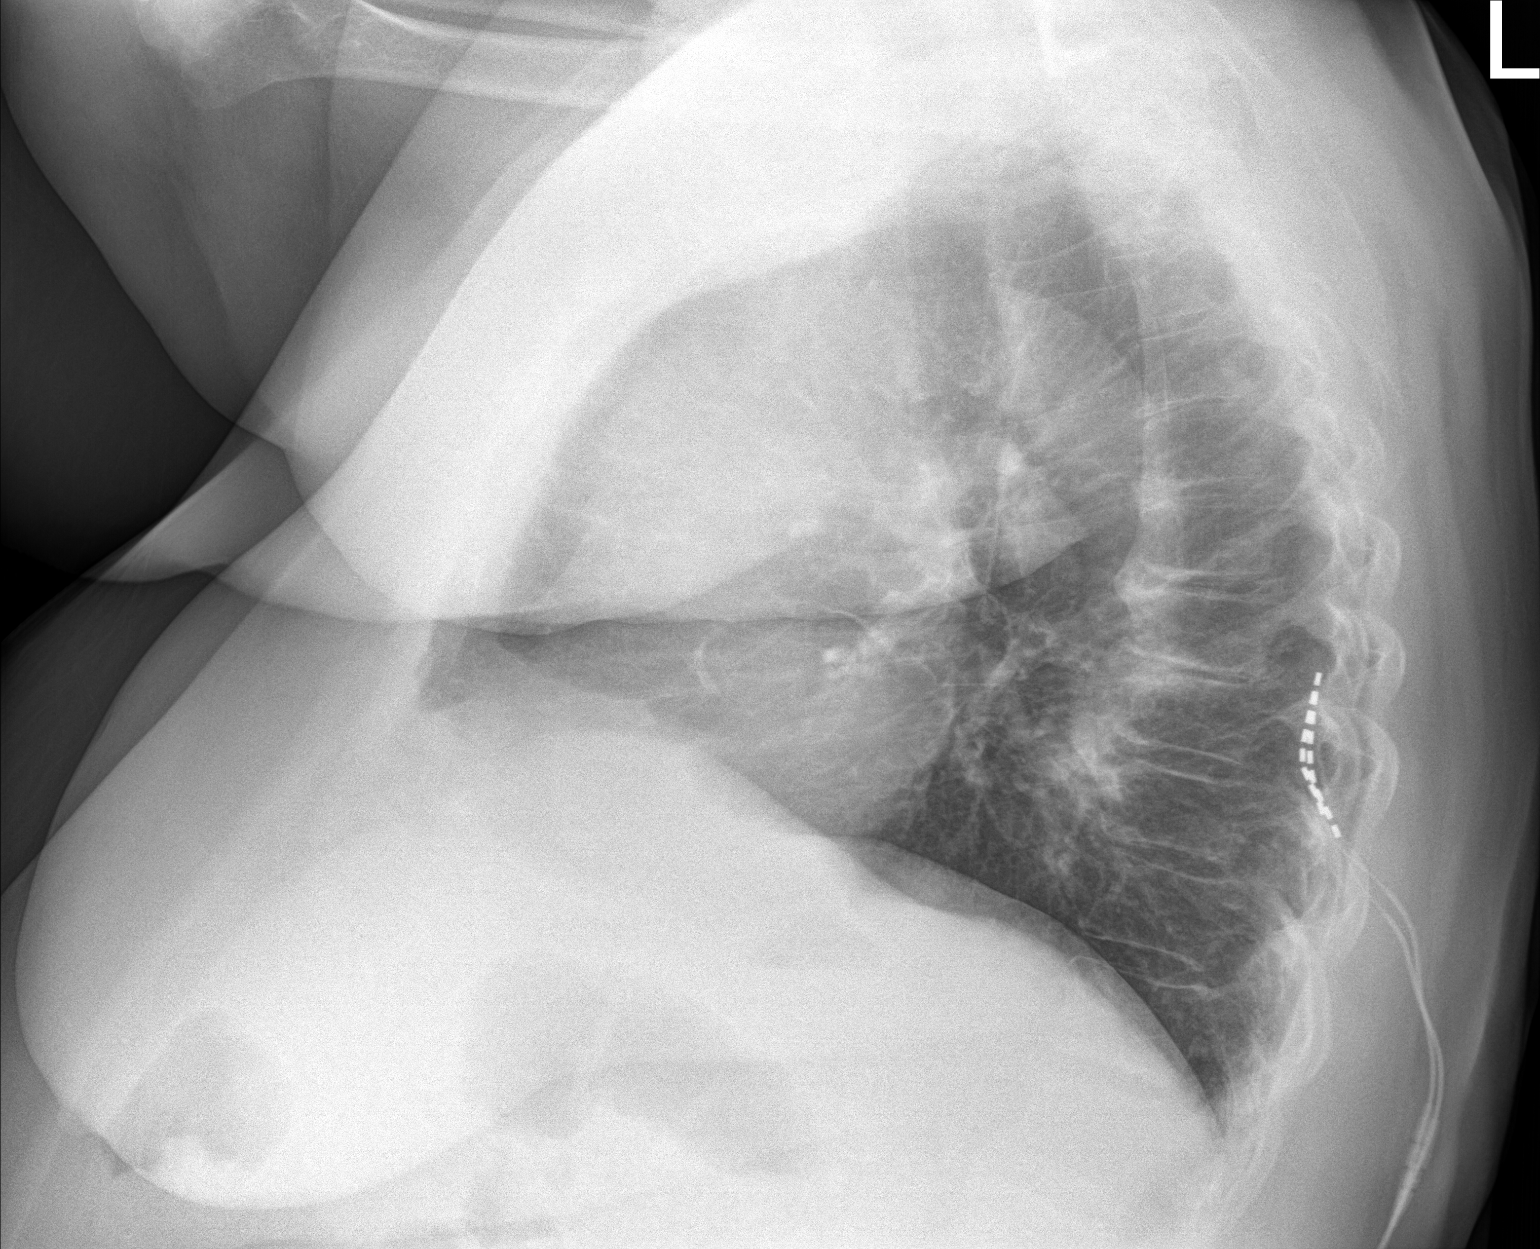

[2 of 2 positions shown; findings below may reference images not displayed]

FINDINGS: Heart and mediastinal contours are within normal limits. No focal
opacities or effusions. No acute bony abnormality. Spinal stimulator
device noted in the mid thoracic spine.
IMPRESSION: No active cardiopulmonary disease.

## 2016-12-14 ENCOUNTER — Other Ambulatory Visit: Payer: Self-pay | Admitting: Family

## 2016-12-14 DIAGNOSIS — M159 Polyosteoarthritis, unspecified: Secondary | ICD-10-CM

## 2016-12-14 DIAGNOSIS — F1120 Opioid dependence, uncomplicated: Secondary | ICD-10-CM

## 2016-12-14 DIAGNOSIS — M545 Low back pain, unspecified: Secondary | ICD-10-CM

## 2016-12-14 DIAGNOSIS — G8929 Other chronic pain: Secondary | ICD-10-CM

## 2016-12-14 DIAGNOSIS — Z0289 Encounter for other administrative examinations: Secondary | ICD-10-CM

## 2016-12-15 ENCOUNTER — Encounter: Payer: Self-pay | Admitting: Family

## 2016-12-15 ENCOUNTER — Telehealth: Payer: Self-pay | Admitting: Family

## 2016-12-15 NOTE — Telephone Encounter (Signed)
NTBS message send via MyChart

## 2016-12-15 NOTE — Telephone Encounter (Signed)
[facility] last Rx written from 3/16 for patients pain medication. They are going to go ahead and refill it while we are trying to get in touch with them.

## 2016-12-22 ENCOUNTER — Encounter: Payer: Self-pay | Admitting: Family

## 2016-12-23 ENCOUNTER — Other Ambulatory Visit: Payer: Self-pay | Admitting: Family

## 2016-12-23 ENCOUNTER — Other Ambulatory Visit: Payer: Self-pay | Admitting: *Deleted

## 2017-01-12 ENCOUNTER — Ambulatory Visit (INDEPENDENT_AMBULATORY_CARE_PROVIDER_SITE_OTHER): Payer: Medicare Other | Admitting: Family

## 2017-01-12 ENCOUNTER — Other Ambulatory Visit: Payer: Self-pay | Admitting: Family

## 2017-01-12 ENCOUNTER — Encounter: Payer: Self-pay | Admitting: Family

## 2017-01-12 ENCOUNTER — Ambulatory Visit (INDEPENDENT_AMBULATORY_CARE_PROVIDER_SITE_OTHER): Payer: Medicare Other

## 2017-01-12 VITALS — BP 155/92 | HR 88 | Temp 96.7°F | Ht 66.0 in | Wt 250.0 lb

## 2017-01-12 DIAGNOSIS — M545 Low back pain, unspecified: Secondary | ICD-10-CM

## 2017-01-12 DIAGNOSIS — R52 Pain, unspecified: Secondary | ICD-10-CM

## 2017-01-12 DIAGNOSIS — Z780 Asymptomatic menopausal state: Secondary | ICD-10-CM | POA: Diagnosis not present

## 2017-01-12 DIAGNOSIS — K580 Irritable bowel syndrome with diarrhea: Secondary | ICD-10-CM

## 2017-01-12 DIAGNOSIS — I10 Essential (primary) hypertension: Secondary | ICD-10-CM | POA: Diagnosis not present

## 2017-01-12 DIAGNOSIS — Z1211 Encounter for screening for malignant neoplasm of colon: Secondary | ICD-10-CM | POA: Diagnosis not present

## 2017-01-12 DIAGNOSIS — Z23 Encounter for immunization: Secondary | ICD-10-CM | POA: Diagnosis not present

## 2017-01-12 DIAGNOSIS — Z0289 Encounter for other administrative examinations: Secondary | ICD-10-CM

## 2017-01-12 DIAGNOSIS — E785 Hyperlipidemia, unspecified: Secondary | ICD-10-CM | POA: Diagnosis not present

## 2017-01-12 DIAGNOSIS — G8929 Other chronic pain: Secondary | ICD-10-CM

## 2017-01-12 DIAGNOSIS — E039 Hypothyroidism, unspecified: Secondary | ICD-10-CM

## 2017-01-12 DIAGNOSIS — M159 Polyosteoarthritis, unspecified: Secondary | ICD-10-CM

## 2017-01-12 DIAGNOSIS — E1165 Type 2 diabetes mellitus with hyperglycemia: Secondary | ICD-10-CM | POA: Diagnosis not present

## 2017-01-12 DIAGNOSIS — F332 Major depressive disorder, recurrent severe without psychotic features: Secondary | ICD-10-CM

## 2017-01-12 DIAGNOSIS — Z1159 Encounter for screening for other viral diseases: Secondary | ICD-10-CM

## 2017-01-12 DIAGNOSIS — F1120 Opioid dependence, uncomplicated: Secondary | ICD-10-CM

## 2017-01-12 LAB — BAYER DCA HB A1C WAIVED: HB A1C: 5.7 % (ref <7.0)

## 2017-01-12 MED ORDER — HYDROCODONE-ACETAMINOPHEN 7.5-325 MG PO TABS: 1.0000 | ORAL_TABLET | Freq: Three times a day (TID) | ORAL | 0 refills | Status: DC | PRN

## 2017-01-12 MED ORDER — ALOSETRON HCL 0.5 MG PO TABS: 0.5000 mg | ORAL_TABLET | Freq: Two times a day (BID) | ORAL | 3 refills | Status: DC

## 2017-01-12 MED ORDER — HYDROCODONE-ACETAMINOPHEN 5-325 MG PO TABS: 1.0000 | ORAL_TABLET | Freq: Three times a day (TID) | ORAL | 0 refills | Status: DC | PRN

## 2017-01-12 MED ORDER — LEVOTHYROXINE SODIUM 50 MCG PO TABS
50.0000 ug | ORAL_TABLET | Freq: Every day | ORAL | 1 refills | Status: DC
Start: 2017-01-12 — End: 2017-05-15

## 2017-01-12 NOTE — Addendum Note (Signed)
Addended by: Rolena Infante on: 01/12/2017 11:55 AM   Modules accepted: Orders

## 2017-01-12 NOTE — Addendum Note (Signed)
Addended by: Rolena Infante on: 01/12/2017 12:06 PM   Modules accepted: Orders

## 2017-01-12 NOTE — Progress Notes (Signed)
Subjective:    Patient ID: Shirley Leblanc, female    DOB: 1946-01-29, 71 y.o.   MRN: [id number]  PT presents to the office today for chronic follow up. Pt is followed by Cardiologists annually of A Fib and HTN states this is stable.  Diabetes  She presents for her follow-up diabetic visit. She has type 2 diabetes mellitus. Hypoglycemia symptoms include nervousness/anxiousness. Pertinent negatives for diabetes include no blurred vision, no foot paresthesias and no visual change. There are no hypoglycemic complications. Symptoms are stable. Pertinent negatives for diabetic complications include no CVA, heart disease, nephropathy or peripheral neuropathy. Risk factors for coronary artery disease include dyslipidemia, diabetes mellitus, female sex, obesity, sedentary lifestyle, post-menopausal and family history. Her breakfast blood glucose range is generally 110-130 mg/dl.  Thyroid Problem  Presents for follow-up visit. Symptoms include anxiety and diarrhea. Patient reports no constipation, hair loss, leg swelling or visual change. The symptoms have been stable. Her past medical history is significant for hyperlipidemia.  Hypertension  This is a chronic problem. The current episode started more than 1 year ago. The problem has been waxing and waning since onset. The problem is uncontrolled. Associated symptoms include anxiety. Pertinent negatives include no blurred vision, malaise/fatigue or peripheral edema. Risk factors for coronary artery disease include diabetes mellitus, dyslipidemia, family history, obesity, sedentary lifestyle and post-menopausal state. The current treatment provides mild improvement. There is no history of CVA. Identifiable causes of hypertension include a thyroid problem.  Depression         This is a chronic problem.  The current episode started more than 1 year ago.   The onset quality is gradual.   The problem occurs intermittently.  Associated symptoms include decreased  concentration, irritable and sad.  Associated symptoms include no helplessness and no hopelessness.     The symptoms are aggravated by family issues.  Compliance with treatment is good.  Past medical history includes thyroid problem and anxiety.   Anxiety  Presents for follow-up visit. Symptoms include decreased concentration, excessive worry and nervous/anxious behavior. Patient reports no irritability. Symptoms occur occasionally.    Hip Pain   The incident occurred more than 1 week ago. The pain is present in the left hip. The quality of the pain is described as aching. The pain is at a severity of 10/10. The pain is moderate. The pain has been constant since onset. Associated symptoms include numbness and tingling. She has tried NSAIDs and acetaminophen for the symptoms. The treatment provided mild relief.  Hyperlipidemia  This is a chronic problem. The current episode started more than 1 year ago. The problem is uncontrolled. Recent lipid tests were reviewed and are high. Exacerbating diseases include obesity. Current antihyperlipidemic treatment includes statins. The current treatment provides moderate improvement of lipids. Risk factors for coronary artery disease include diabetes mellitus, dyslipidemia, hypertension, post-menopausal, a sedentary lifestyle and family history.  Back Pain  This is a chronic problem. The current episode started more than 1 year ago. The problem occurs intermittently. The problem has been waxing and waning since onset. The pain is present in the lumbar spine. The quality of the pain is described as aching. The pain is at a severity of 6/10. The pain is moderate. Associated symptoms include numbness and tingling. Pertinent negatives include no bladder incontinence or bowel incontinence. She has tried analgesics and bed rest for the symptoms. The treatment provided mild relief.      Review of Systems  Constitutional: Negative for irritability and malaise/fatigue.  Eyes: Negative for blurred vision.  Gastrointestinal: Positive for diarrhea. Negative for bowel incontinence and constipation.  Genitourinary: Negative for bladder incontinence.  Musculoskeletal: Positive for back pain.  Neurological: Positive for tingling and numbness.  Psychiatric/Behavioral: Positive for decreased concentration and depression. The patient is nervous/anxious.   All other systems reviewed and are negative.      Objective:   Physical Exam  Constitutional: She is oriented to person, place, and time. She appears well-developed and well-nourished. She is irritable. No distress.  Morbid obese   HENT:  Head: Normocephalic and atraumatic.  Right Ear: External ear normal.  Left Ear: External ear normal.  Nose: Nose normal.  Mouth/Throat: Oropharynx is clear and moist.  Eyes: Pupils are equal, round, and reactive to light.  Neck: Normal range of motion. Neck supple. No thyromegaly present.  Cardiovascular: Normal rate, regular rhythm, normal heart sounds and intact distal pulses.   No murmur heard. Pulmonary/Chest: Effort normal and breath sounds normal. No respiratory distress. She has no wheezes.  Abdominal: Soft. Bowel sounds are normal. She exhibits no distension. There is no tenderness.  Musculoskeletal: She exhibits no edema or tenderness.  Walking with cane, pain in right hip with flexion and extension   Neurological: She is alert and oriented to person, place, and time.  Skin: Skin is warm and dry.  Psychiatric: She has a normal mood and affect. Her behavior is normal. Judgment and thought content normal.  Vitals reviewed.     BP (!) 174/106   Pulse 88   Temp (!) 96.7 F (35.9 C) (Oral)   Ht 5' 6"  (1.676 m)   Wt 250 lb (113.4 kg)   BMI 40.35 kg/m      Assessment & Plan:  1. Essential hypertension - CMP14+EGFR  2. Hypothyroidism, unspecified type - CMP14+EGFR - Thyroid Panel With TSH  3. Type 2 diabetes mellitus with hyperglycemia, without  long-term current use of insulin (HCC)  - CMP14+EGFR - Bayer DCA Hb A1c Waived - Ambulatory referral to Ophthalmology  4. Chronic right-sided low back pain without sciatica - CMP14+EGFR - HYDROcodone-acetaminophen (NORCO) 7.5-325 MG tablet; Take 1-2 tablets by mouth every 8 (eight) hours as needed for moderate pain.  Dispense: 120 tablet; Refill: 0 - HYDROcodone-acetaminophen (NORCO) 7.5-325 MG tablet; Take 1-2 tablets by mouth every 8 (eight) hours as needed for moderate pain.  Dispense: 120 tablet; Refill: 0 - HYDROcodone-acetaminophen (NORCO/VICODIN) 5-325 MG tablet; Take 1 tablet by mouth every 8 (eight) hours as needed for moderate pain.  Dispense: 120 tablet; Refill: 0  5. Pain medication agreement signed  - CMP14+EGFR - HYDROcodone-acetaminophen (NORCO) 7.5-325 MG tablet; Take 1-2 tablets by mouth every 8 (eight) hours as needed for moderate pain.  Dispense: 120 tablet; Refill: 0 - HYDROcodone-acetaminophen (NORCO) 7.5-325 MG tablet; Take 1-2 tablets by mouth every 8 (eight) hours as needed for moderate pain.  Dispense: 120 tablet; Refill: 0 - HYDROcodone-acetaminophen (NORCO/VICODIN) 5-325 MG tablet; Take 1 tablet by mouth every 8 (eight) hours as needed for moderate pain.  Dispense: 120 tablet; Refill: 0  6. Uncomplicated opioid dependence ([place])  - CMP14+EGFR - HYDROcodone-acetaminophen (NORCO) 7.5-325 MG tablet; Take 1-2 tablets by mouth every 8 (eight) hours as needed for moderate pain.  Dispense: 120 tablet; Refill: 0 - HYDROcodone-acetaminophen (NORCO) 7.5-325 MG tablet; Take 1-2 tablets by mouth every 8 (eight) hours as needed for moderate pain.  Dispense: 120 tablet; Refill: 0 - HYDROcodone-acetaminophen (NORCO/VICODIN) 5-325 MG tablet; Take 1 tablet by mouth every 8 (eight) hours as needed for moderate  pain.  Dispense: 120 tablet; Refill: 0  7. Hyperlipidemia, unspecified hyperlipidemia type - CMP14+EGFR - Lipid panel  8. Morbid obesity ([place])  - CMP14+EGFR  9. Severe  episode of recurrent major depressive disorder, without psychotic features ([place]) - CMP14+EGFR  10. Post-menopause  - CMP14+EGFR - DG [facility] DEXA  11. Need for hepatitis C screening test - CMP14+EGFR - Hepatitis C antibody  12. Colon cancer screening - Fecal occult blood, imunochemical; Future  13. Osteoarthritis of multiple joints, unspecified osteoarthritis type Norco increased to 120 tabs a month from 90 tabs Discussed we can not increase any more then this dose! - HYDROcodone-acetaminophen (NORCO) 7.5-325 MG tablet; Take 1-2 tablets by mouth every 8 (eight) hours as needed for moderate pain.  Dispense: 120 tablet; Refill: 0 - HYDROcodone-acetaminophen (NORCO) 7.5-325 MG tablet; Take 1-2 tablets by mouth every 8 (eight) hours as needed for moderate pain.  Dispense: 120 tablet; Refill: 0 - HYDROcodone-acetaminophen (NORCO/VICODIN) 5-325 MG tablet; Take 1 tablet by mouth every 8 (eight) hours as needed for moderate pain.  Dispense: 120 tablet; Refill: 0  14. Irritable bowel syndrome with diarrhea - alosetron (LOTRONEX) 0.5 MG tablet; Take 1 tablet (0.5 mg total) by mouth 2 (two) times daily.  Dispense: 60 tablet; Refill: 3   Continue all meds Labs pending Health Maintenance reviewed Diet and exercise encouraged RTO 3 months  Evelina Dun, FNP

## 2017-01-12 NOTE — Patient Instructions (Signed)

## 2017-01-13 ENCOUNTER — Other Ambulatory Visit: Payer: Self-pay | Admitting: Family

## 2017-01-13 ENCOUNTER — Telehealth: Payer: Self-pay | Admitting: Family

## 2017-01-13 LAB — CMP14+EGFR
ALK PHOS: 76 IU/L (ref 39–117)
ALT: 13 IU/L (ref 0–32)
AST: 18 IU/L (ref 0–40)
Albumin/Globulin Ratio: 1.7 (ref 1.2–2.2)
Albumin: 4.3 g/dL (ref 3.5–4.8)
BILIRUBIN TOTAL: 0.5 mg/dL (ref 0.0–1.2)
BUN / CREAT RATIO: 16 (ref 12–28)
BUN: 13 mg/dL (ref 8–27)
CHLORIDE: 100 mmol/L (ref 96–106)
CO2: 26 mmol/L (ref 20–29)
Calcium: 10.2 mg/dL (ref 8.7–10.3)
Creatinine, Ser: 0.81 mg/dL (ref 0.57–1.00)
GFR calc non Af Amer: 73 mL/min/{1.73_m2} (ref >59)
GFR, EST AFRICAN AMERICAN: 85 mL/min/{1.73_m2} (ref >59)
Globulin, Total: 2.6 g/dL (ref 1.5–4.5)
Glucose: 118 mg/dL — ABNORMAL HIGH (ref 65–99)
POTASSIUM: 4.5 mmol/L (ref 3.5–5.2)
Sodium: 145 mmol/L — ABNORMAL HIGH (ref 134–144)
TOTAL PROTEIN: 6.9 g/dL (ref 6.0–8.5)

## 2017-01-13 LAB — LIPID PANEL
CHOLESTEROL TOTAL: 213 mg/dL — AB (ref 100–199)
Chol/HDL Ratio: 3.6 ratio (ref 0.0–4.4)
HDL: 60 mg/dL (ref >39)
LDL Calculated: 124 mg/dL — ABNORMAL HIGH (ref 0–99)
Triglycerides: 144 mg/dL (ref 0–149)
VLDL Cholesterol Cal: 29 mg/dL (ref 5–40)

## 2017-01-13 LAB — THYROID PANEL WITH TSH
FREE THYROXINE INDEX: 2.4 (ref 1.2–4.9)
T3 Uptake Ratio: 24 % (ref 24–39)
T4, Total: 10.1 ug/dL (ref 4.5–12.0)
TSH: 2.93 u[IU]/mL (ref 0.450–4.500)

## 2017-01-13 LAB — HEPATITIS C ANTIBODY

## 2017-01-13 MED ORDER — AMLODIPINE BESYLATE 5 MG PO TABS: 5.0000 mg | ORAL_TABLET | Freq: Every day | ORAL | 3 refills | Status: DC

## 2017-01-13 MED ORDER — ATORVASTATIN CALCIUM 20 MG PO TABS: 20.0000 mg | ORAL_TABLET | Freq: Every day | ORAL | 3 refills | Status: DC

## 2017-01-13 NOTE — Addendum Note (Signed)
Addended by: Evelina Dun A on: 01/13/2017 11:34 AM   Modules accepted: Orders

## 2017-01-13 NOTE — Telephone Encounter (Signed)
Patient's granddaughter called stating that patient has been previously taking HCTZ 12.mg capsules once daily for hypertension.  Patient has been out of this medication for 2 weeks and would [facility]

## 2017-01-13 NOTE — Telephone Encounter (Signed)
Granddaughter aware of recommendations

## 2017-01-13 NOTE — Telephone Encounter (Signed)
Pt should be taking accupril 40 mg daily, lasix 20 daily. We will hold off on restarting HCTZ since lasix is a fluid pill. We will send Norvasc 5 mg Prescription sent to pharmacy. When pt comes for Ortho appt on Thursday have nurse recheck BP and put in EPIC.

## 2017-01-15 ENCOUNTER — Telehealth: Payer: Self-pay

## 2017-01-15 ENCOUNTER — Other Ambulatory Visit: Payer: Medicare Other

## 2017-01-15 DIAGNOSIS — M5416 Radiculopathy, lumbar region: Secondary | ICD-10-CM | POA: Diagnosis not present

## 2017-01-15 DIAGNOSIS — Z1211 Encounter for screening for malignant neoplasm of colon: Secondary | ICD-10-CM

## 2017-01-16 MED ORDER — ELUXADOLINE 100 MG PO TABS: 100.0000 mg | ORAL_TABLET | Freq: Two times a day (BID) | ORAL | 5 refills | Status: DC

## 2017-01-16 NOTE — Telephone Encounter (Signed)
Pt aware of the change 

## 2017-01-16 NOTE — Telephone Encounter (Signed)
Insurance does not cover alosetron. [facility]zi Prescription sent to pharmacy. Please call rx in.

## 2017-01-18 LAB — FECAL OCCULT BLOOD, IMMUNOCHEMICAL: Fecal Occult Bld: NEGATIVE

## 2017-01-31 ENCOUNTER — Other Ambulatory Visit: Payer: Self-pay | Admitting: Family

## 2017-01-31 DIAGNOSIS — F411 Generalized anxiety disorder: Secondary | ICD-10-CM

## 2017-01-31 DIAGNOSIS — F331 Major depressive disorder, recurrent, moderate: Secondary | ICD-10-CM

## 2017-02-02 ENCOUNTER — Encounter: Payer: Self-pay | Admitting: Pharmacist

## 2017-02-02 ENCOUNTER — Ambulatory Visit (INDEPENDENT_AMBULATORY_CARE_PROVIDER_SITE_OTHER): Payer: Medicare Other | Admitting: Pharmacist

## 2017-02-02 DIAGNOSIS — M85852 Other specified disorders of bone density and structure, left thigh: Secondary | ICD-10-CM

## 2017-02-02 DIAGNOSIS — M85851 Other specified disorders of bone density and structure, right thigh: Secondary | ICD-10-CM

## 2017-02-02 DIAGNOSIS — M8580 Other specified disorders of bone density and structure, unspecified site: Secondary | ICD-10-CM | POA: Insufficient documentation

## 2017-02-02 NOTE — Telephone Encounter (Signed)
Last seen 01/12/17  Shirley Leblanc  If approved route to [facility]
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
## 2017-02-02 NOTE — Patient Instructions (Signed)
Start calcium carbonate - gummies or chewable - take 671m daily   Calcium & Vitamin D: The Facts  Why is calcium and vitamin D consumption important? Calcium: . Most Americans do not consume adequate amounts of calcium! Calcium is required for proper muscle function, nerve communication, bone support, and many other functions in the body.  . The body uses bones as a source of calcium. Bones 'remodel' themselves continuously - the body constantly breaks bone down to release calcium and rebuilds bones by replacing calcium in the bone later.  . As we get older, the rate of bone breakdown occurs faster than bone rebuilding which could lead to osteopenia, osteoporosis, and possible fractures.   Vitamin D: . People naturally make vitamin D in the body when sunlight hits the skin and triggers a process that leads to vitamin D production. This natural vitamin D production requires about 10-15 minutes of sun exposure on the hands, arms, and face at least 2-3 times per week. However, due to decreased sun exposure and the use of sunscreen, most people will need to get additional vitamin D from foods or supplements. Your doctor can measure your body's vitamin D level through a simple blood test to determine your daily vitamin D needs.  . Vitamin D is used to help the body absorb calcium, maintain bone health, help the immune system, and reduce inflammation. It also plays a role in muscle performance, balance and risk of falling.  . Vitamin D deficiency can lead to osteomalacia or softening of the bones, bone pain, and muscle weakness.   The recommended daily allowance of Calcium and Vitamin D varies for different age groups. Age group Calcium (mg) Vitamin D (IU)  Females and Males: Age 43-50 1000 mg 600 IU  Females: Age 49654- 911200 mg 600 IU  Males: Age 49678-701000 mg 600 IU  Females and Males: Age 52+ 1200 mg 800 IU  Pregnant/lactating Females age 22106-501000 mg 600 IU   How much Calcium do you get in  your diet? Calcium Intake # of servings per day  Total calcium (mg)  Skim milk, 2% milk (1 cup) _________ x 300 mg   Yogurt (1 small container) _________ x 200 mg   Cheese (1oz) _________ x 200 mg   Cottage Cheese (1 cup)             ________ x 150 mg   Almond milk (1 cup) _________ x 450 mg   Fortified Orange Juice (1 cup) _________ x 300 mg   Broccoli or spinach ( 1 cup) _________ x 100 mg   Salmon (3 oz) _________ x 150 mg    Almonds (1/4 cup) _______ x 90 mg      How do we get Calcium and Vitamin D in our diet? Calcium: . Obtaining calcium from the diet is the most preferred way to reach the recommended daily goal. If this goal is not reached through diet, calcium supplements are available.  . Calcium is found in many foods including: dairy products, dark leafy vegetables (like broccoli, kale, and spinach), fish, and fortified products like juices and cereals.  . The food label will have a %DV (percent daily value) listed showing the amount of calcium per serving. To determine the total mg per serving, simply replace the % with zero (0).  For example, Almond Breeze almond milk contains 45% DV of calcium or 4533mper 1 cup.  . You can increase the amount of calcium in your diet by  using more calcium products in your daily meals. Use yogurt and fruit to make smoothies or use yogurt to top baked potatoes or make whipped potatoes. Sprinkle low fat cheese onto salads or into egg white omelets. You can even add non-fat dry milk powder (374m calcium per 1/3 cup) to hot cereals, meat loaf, soups, or potatoes.  . Calcium supplements come in many forms including tablets, chewables, and gummies. Be sure to read the label to determine the correct number of tablets per serving and whether or not to take the supplement with food.  . Calcium carbonate products (Oscal, Caltrate, and Viactiv) are generally better absorbed when taken with food while calcium citrate products like Citracal can be taken with  or without food.  . The body can only absorb about 600 mg of calcium at one time. It is recommended to take calcium supplements in small amounts several times per day.  However, taking it all at once is better than not taking it at all. . Increasing your intake of calcium is essential for bone health, but may also lead to some side effects like constipation, increased gas, bloating or abdominal cramping. To help reduce these side effects, start with 1 tablet per day and slowly increase your intake of the supplement to the recommended doses. It is also recommended that you drink plenty of water each day. Vitamin D: . Very few foods naturally contain vitamin D. However, it is found in saltwater fish (like tuna, salmon and mackerel), beef liver, egg yolks, cheese and vitamin D fortified foods (like yogurt, cereals, orange juice and milk) . The amount of vitamin D in each food or product is listed as %DV on the product label. To determine the total amount of vitamin D per serving, drop the % sign and multiply the number by 4. For example, 1 cup of Almond Breeze almond milk contains 25% DV vitamin D or 100 IU per serving (25 x 4 =100). . Vitamin D is also found in multivitamins and supplements and may be listed as ergocalciferol (vitamin D2) or cholecalciferol (vitamin D3). Each of these forms of vitamin D are equivalent and the daily recommended intake will vary based on your age and the vitamin D levels in your body. Follow your doctor's recommendation for vitamin D intake.                      Exercise for Strong Bones  Exercise is important to build and maintain strong bones / bone density.  There are 2 types of exercises that are important to building and maintaining strong bones:  Weight- bearing and muscle-stregthening.  Weight-bearing Exercises  These exercises include activities that make you move against gravity while staying upright. Weight-bearing exercises can be high-impact or  low-impact.  High-impact weight-bearing exercises help build bones and keep them strong. If you have broken a bone due to osteoporosis or are at risk of breaking a bone, you may need to avoid high-impact exercises. If you're not sure, you should check with your healthcare provider.  Examples of high-impact weight-bearing exercises are: Dancing  Doing high-impact aerobics  Hiking  Jogging/running  Jumping Rope  Stair climbing  Tennis  Low-impact weight-bearing exercises can also help keep bones strong and are a safe alternative if you cannot do high-impact exercises.   Examples of low-impact weight-bearing exercises are: Using elliptical training machines  Doing low-impact aerobics  Using stair-step machines  Fast walking on a treadmill or outside   Muscle-Strengthening Exercises  These exercises include activities where you move your body, a weight or some other resistance against gravity. They are also known as resistance exercises and include: Lifting weights  Using elastic exercise bands  Using weight machines  Lifting your own body weight  Functional movements, such as standing and rising up on your toes  Yoga and Pilates can also improve strength, balance and flexibility. However, certain positions may not be safe for people with osteoporosis or those at increased risk of broken bones. For example, exercises that have you bend forward may increase the chance of breaking a bone in the spine.   Non-Impact Exercises There are other types of exercises that can help prevent falls.  Non-impact exercises can help you to improve balance, posture and how well you move in everyday activities. Some of these exercises include: Balance exercises that strengthen your legs and test your balance, such as Tai Chi, can decrease your risk of falls.  Posture exercises that improve your posture and reduce rounded or "sloping" shoulders can help you decrease the chance of breaking a bone, especially  in the spine.  Functional exercises that improve how well you move can help you with everyday activities and decrease your chance of falling and breaking a bone. For example, if you have trouble getting up from a chair or climbing stairs, you should do these activities as exercises.   **A physical therapist can teach you balance, posture and functional exercises. He/she can also help you learn which exercises are safe and appropriate for you.  [facility] has a physical therapy office in [place] in front of our office and referrals can be made for assessments and treatment as needed and strength and balance training.  If you would like to have an assessment with [facility] and our physical therapy team please let a nurse or provider know.   Fall Prevention in the Home Falls can cause injuries and can affect people from all age groups. There are many simple things that you can do to make your home safe and to help prevent falls. What can I do on the outside of my home?  Regularly repair the edges of walkways and driveways and fix any cracks.  Remove high doorway thresholds.  Trim any shrubbery on the main path into your home.  Use bright outdoor lighting.  Clear walkways of debris and clutter, including tools and rocks.  Regularly check that handrails are securely fastened and in good repair. Both sides of any steps should have handrails.  Install guardrails along the edges of any raised decks or porches.  Have leaves, snow, and ice cleared regularly.  Use sand or salt on walkways during winter months.  In the garage, clean up any spills right away, including grease or oil spills. What can I do in the bathroom?  Use night lights.  Install grab bars by the toilet and in the tub and shower. Do not use towel bars as grab bars.  Use non-skid mats or decals on the floor of the tub or shower.  If you need to sit down while you are in the shower, use a plastic, non-slip stool.  Keep the  floor dry. Immediately clean up any water that spills on the floor.  Remove soap buildup in the tub or shower on a regular basis.  Attach bath mats securely with double-sided non-slip rug tape.  Remove throw rugs and other tripping hazards from the floor. What can I do in the bedroom?  Use night lights.  Make sure that a bedside light is easy to reach.  Do not use oversized bedding that drapes onto the floor.  Have a firm chair that has side arms to use for getting dressed.  Remove throw rugs and other tripping hazards from the floor. What can I do in the kitchen?  Clean up any spills right away.  Avoid walking on wet floors.  Place frequently used items in easy-to-reach places.  If you need to reach for something above you, use a sturdy step stool that has a grab bar.  Keep electrical cables out of the way.  Do not use floor polish or wax that makes floors slippery. If you have to use wax, make sure that it is non-skid floor wax.  Remove throw rugs and other tripping hazards from the floor. What can I do in the stairways?  Do not leave any items on the stairs.  Make sure that there are handrails on both sides of the stairs. Fix handrails that are broken or loose. Make sure that handrails are as long as the stairways.  Check any carpeting to make sure that it is firmly attached to the stairs. Fix any carpet that is loose or worn.  Avoid having throw rugs at the top or bottom of stairways, or secure the rugs with carpet tape to prevent them from moving.  Make sure that you have a light switch at the top of the stairs and the bottom of the stairs. If you do not have them, have them installed. What are some other fall prevention tips?  Wear closed-toe shoes that fit well and support your feet. Wear shoes that have rubber soles or low heels.  When you use a stepladder, make sure that it is completely opened and that the sides are firmly locked. Have someone hold the ladder  while you are using it. Do not climb a closed stepladder.  Add color or contrast paint or tape to grab bars and handrails in your home. Place contrasting color strips on the first and last steps.  Use mobility aids as needed, such as canes, walkers, scooters, and crutches.  Turn on lights if it is dark. Replace any light bulbs that burn out.  Set up furniture so that there are clear paths. Keep the furniture in the same spot.  Fix any uneven floor surfaces.  Choose a carpet design that does not hide the edge of steps of a stairway.  Be aware of any and all pets.  Review your medicines with your healthcare provider. Some medicines can cause dizziness or changes in blood pressure, which increase your risk of falling. Talk with your health care provider about other ways that you can decrease your risk of falls. This may include working with a physical therapist or trainer to improve your strength, balance, and endurance. This information is not intended to replace advice given to you by your health care provider. Make sure you discuss any questions you have with your health care provider. Document Released: 07/04/2002 Document Revised: 12/11/2015 Document Reviewed: 08/18/2014 Elsevier Interactive Patient Education  2017 [employer].

## 2017-02-02 NOTE — Telephone Encounter (Signed)
Rx called in to pharmacy. 

## 2017-02-03 LAB — VITAMIN D 25 HYDROXY (VIT D DEFICIENCY, FRACTURES): Vit D, 25-Hydroxy: 25.4 ng/mL — ABNORMAL LOW (ref 30.0–100.0)

## 2017-02-16 ENCOUNTER — Other Ambulatory Visit: Payer: Self-pay | Admitting: Family

## 2017-02-16 DIAGNOSIS — G8929 Other chronic pain: Secondary | ICD-10-CM

## 2017-02-16 DIAGNOSIS — M159 Polyosteoarthritis, unspecified: Secondary | ICD-10-CM

## 2017-02-16 DIAGNOSIS — M545 Low back pain, unspecified: Secondary | ICD-10-CM

## 2017-02-16 DIAGNOSIS — Z0289 Encounter for other administrative examinations: Secondary | ICD-10-CM

## 2017-02-16 DIAGNOSIS — F1120 Opioid dependence, uncomplicated: Secondary | ICD-10-CM

## 2017-03-26 ENCOUNTER — Other Ambulatory Visit: Payer: Self-pay | Admitting: Family

## 2017-03-26 DIAGNOSIS — F331 Major depressive disorder, recurrent, moderate: Secondary | ICD-10-CM

## 2017-03-26 NOTE — Telephone Encounter (Signed)
Next OV 04/13/17

## 2017-04-13 ENCOUNTER — Encounter: Payer: Self-pay | Admitting: Family

## 2017-04-13 ENCOUNTER — Ambulatory Visit (INDEPENDENT_AMBULATORY_CARE_PROVIDER_SITE_OTHER): Payer: Medicare Other | Admitting: Family

## 2017-04-13 VITALS — BP 114/74 | HR 91 | Temp 97.8°F | Ht 66.0 in | Wt 249.2 lb

## 2017-04-13 DIAGNOSIS — I10 Essential (primary) hypertension: Secondary | ICD-10-CM | POA: Diagnosis not present

## 2017-04-13 DIAGNOSIS — E039 Hypothyroidism, unspecified: Secondary | ICD-10-CM | POA: Diagnosis not present

## 2017-04-13 DIAGNOSIS — F332 Major depressive disorder, recurrent severe without psychotic features: Secondary | ICD-10-CM

## 2017-04-13 DIAGNOSIS — F1120 Opioid dependence, uncomplicated: Secondary | ICD-10-CM | POA: Diagnosis not present

## 2017-04-13 DIAGNOSIS — F411 Generalized anxiety disorder: Secondary | ICD-10-CM | POA: Diagnosis not present

## 2017-04-13 DIAGNOSIS — I480 Paroxysmal atrial fibrillation: Secondary | ICD-10-CM

## 2017-04-13 DIAGNOSIS — M159 Polyosteoarthritis, unspecified: Secondary | ICD-10-CM | POA: Diagnosis not present

## 2017-04-13 DIAGNOSIS — Z0289 Encounter for other administrative examinations: Secondary | ICD-10-CM | POA: Diagnosis not present

## 2017-04-13 DIAGNOSIS — G8929 Other chronic pain: Secondary | ICD-10-CM

## 2017-04-13 DIAGNOSIS — E1169 Type 2 diabetes mellitus with other specified complication: Secondary | ICD-10-CM

## 2017-04-13 DIAGNOSIS — M545 Low back pain, unspecified: Secondary | ICD-10-CM

## 2017-04-13 DIAGNOSIS — E1165 Type 2 diabetes mellitus with hyperglycemia: Secondary | ICD-10-CM | POA: Diagnosis not present

## 2017-04-13 DIAGNOSIS — E785 Hyperlipidemia, unspecified: Secondary | ICD-10-CM

## 2017-04-13 LAB — BAYER DCA HB A1C WAIVED: HB A1C: 5.5 % (ref <7.0)

## 2017-04-13 MED ORDER — BUSPIRONE HCL 7.5 MG PO TABS: 7.5000 mg | ORAL_TABLET | Freq: Three times a day (TID) | ORAL | 2 refills | Status: DC

## 2017-04-13 MED ORDER — HYDROCODONE-ACETAMINOPHEN 7.5-325 MG PO TABS: 1.0000 | ORAL_TABLET | Freq: Three times a day (TID) | ORAL | 0 refills | Status: DC | PRN

## 2017-04-13 NOTE — Progress Notes (Signed)
Subjective:    Patient ID: Shirley Leblanc, female    DOB: 1945-08-05, 71 y.o.   MRN: [id number]  PT presents to the office today for chronic follow up. Pt is followed by Cardiologists annually of A Fib and HTN states this is stable.  Diabetes  She presents for her follow-up diabetic visit. She has type 2 diabetes mellitus. Her disease course has been stable. There are no hypoglycemic associated symptoms. Associated symptoms include fatigue. Pertinent negatives for diabetes include no foot paresthesias, no foot ulcerations and no visual change. There are no hypoglycemic complications. Symptoms are stable. Pertinent negatives for diabetic complications include no CVA, heart disease, nephropathy or peripheral neuropathy. Risk factors for coronary artery disease include diabetes mellitus, dyslipidemia, obesity, post-menopausal, sedentary lifestyle and family history. Her weight is stable. Her breakfast blood glucose range is generally 110-130 mg/dl. Eye exam is not current.  Thyroid Problem  Presents for follow-up visit. Symptoms include constipation, diarrhea, fatigue and hoarse voice. Patient reports no visual change. The symptoms have been stable.  Hypertension  This is a chronic problem. The current episode started more than 1 year ago. The problem has been waxing and waning since onset. The problem is uncontrolled. Associated symptoms include malaise/fatigue. Pertinent negatives include no peripheral edema or shortness of breath. Risk factors for coronary artery disease include diabetes mellitus, dyslipidemia, family history, obesity, post-menopausal state and sedentary lifestyle. The current treatment provides mild improvement. There is no history of CAD/MI or CVA. Identifiable causes of hypertension include a thyroid problem.  Back Pain  This is a chronic problem. The current episode started more than 1 year ago. The problem occurs intermittently. The problem has been waxing and waning since onset.  The pain is present in the lumbar spine. The quality of the pain is described as aching. The pain is at a severity of 4/10. The pain is moderate. Pertinent negatives include no bladder incontinence, bowel incontinence, dysuria or leg pain. She has tried bed rest, analgesics and NSAIDs for the symptoms. The treatment provided moderate relief.      Review of Systems  Constitutional: Positive for fatigue and malaise/fatigue.  HENT: Positive for hoarse voice.   Respiratory: Negative for shortness of breath.   Gastrointestinal: Positive for constipation and diarrhea. Negative for bowel incontinence.  Genitourinary: Negative for bladder incontinence and dysuria.  Musculoskeletal: Positive for back pain.  All other systems reviewed and are negative.      Objective:   Physical Exam  Constitutional: She is oriented to person, place, and time. She appears well-developed and well-nourished. No distress.  Morbid obese  HENT:  Head: Normocephalic and atraumatic.  Right Ear: External ear normal.  Left Ear: External ear normal.  Nose: Nose normal.  Mouth/Throat: Oropharynx is clear and moist.  Eyes: Pupils are equal, round, and reactive to light.  Neck: Normal range of motion. Neck supple. No thyromegaly present.  Cardiovascular: Normal rate, regular rhythm, normal heart sounds and intact distal pulses.   No murmur heard. Pulmonary/Chest: Effort normal and breath sounds normal. No respiratory distress. She has no wheezes.  Abdominal: Soft. Bowel sounds are normal. She exhibits no distension. There is no tenderness.  Musculoskeletal: Normal range of motion. She exhibits no edema or tenderness.  Neurological: She is alert and oriented to person, place, and time.  Skin: Skin is warm and dry.  Psychiatric: She has a normal mood and affect. Her behavior is normal. Judgment and thought content normal.  Vitals reviewed.     BP Marland Kitchen)  143/92   Pulse 93   Temp 97.8 F (36.6 C) (Oral)   Ht 5' 6"  (1.676 m)   Wt 249 lb 3.2 oz (113 kg)   BMI 40.22 kg/m      Assessment & Plan:  1. Essential hypertension - CMP14+EGFR  2. Paroxysmal atrial fibrillation (HCC) - CMP14+EGFR  3. Hypothyroidism, unspecified type - CMP14+EGFR  4. Type 2 diabetes mellitus with hyperglycemia, without long-term current use of insulin (HCC) - Ambulatory referral to Ophthalmology - Bayer DCA Hb A1c Waived - CMP14+EGFR  5. Hyperlipidemia associated with type 2 diabetes mellitus (HCC) - CMP14+EGFR  6. Severe episode of recurrent major depressive disorder, without psychotic features (HCC) - CMP14+EGFR  7. Morbid obesity (HCC) - CMP14+EGFR  8. Uncomplicated opioid dependence (HCC) - HYDROcodone-acetaminophen (NORCO) 7.5-325 MG tablet; Take 1 tablet by mouth every 8 (eight) hours as needed for moderate pain. Do not fill until 60 days from prescription date  Dispense: 110 tablet; Refill: 0 - HYDROcodone-acetaminophen (NORCO) 7.5-325 MG tablet; Take 1-2 tablets by mouth every 8 (eight) hours as needed for moderate pain.  Dispense: 110 tablet; Refill: 0 - HYDROcodone-acetaminophen (NORCO) 7.5-325 MG tablet; Take 1-2 tablets by mouth every 8 (eight) hours as needed for moderate pain.  Dispense: 110 tablet; Refill: 0 - CMP14+EGFR  9. Pain medication agreement signed - HYDROcodone-acetaminophen (NORCO) 7.5-325 MG tablet; Take 1 tablet by mouth every 8 (eight) hours as needed for moderate pain. Do not fill until 60 days from prescription date  Dispense: 110 tablet; Refill: 0 - HYDROcodone-acetaminophen (NORCO) 7.5-325 MG tablet; Take 1-2 tablets by mouth every 8 (eight) hours as needed for moderate pain.  Dispense: 110 tablet; Refill: 0 - HYDROcodone-acetaminophen (NORCO) 7.5-325 MG tablet; Take 1-2 tablets by mouth every 8 (eight) hours as needed for moderate pain.  Dispense: 110 tablet; Refill: 0 - CMP14+EGFR  10. Chronic right-sided low back pain without sciatica - HYDROcodone-acetaminophen (NORCO) 7.5-325  MG tablet; Take 1 tablet by mouth every 8 (eight) hours as needed for moderate pain. Do not fill until 60 days from prescription date  Dispense: 110 tablet; Refill: 0 - HYDROcodone-acetaminophen (NORCO) 7.5-325 MG tablet; Take 1-2 tablets by mouth every 8 (eight) hours as needed for moderate pain.  Dispense: 110 tablet; Refill: 0 - HYDROcodone-acetaminophen (NORCO) 7.5-325 MG tablet; Take 1-2 tablets by mouth every 8 (eight) hours as needed for moderate pain.  Dispense: 110 tablet; Refill: 0 - CMP14+EGFR  11. Osteoarthritis of multiple joints, unspecified osteoarthritis type - HYDROcodone-acetaminophen (NORCO) 7.5-325 MG tablet; Take 1 tablet by mouth every 8 (eight) hours as needed for moderate pain. Do not fill until 60 days from prescription date  Dispense: 110 tablet; Refill: 0 - HYDROcodone-acetaminophen (NORCO) 7.5-325 MG tablet; Take 1-2 tablets by mouth every 8 (eight) hours as needed for moderate pain.  Dispense: 110 tablet; Refill: 0 - HYDROcodone-acetaminophen (NORCO) 7.5-325 MG tablet; Take 1-2 tablets by mouth every 8 (eight) hours as needed for moderate pain.  Dispense: 110 tablet; Refill: 0 - CMP14+EGFR  12. GAD (generalized anxiety disorder) Xanax stopped today and Buspar increased to 7.5 mg from 5 mg - busPIRone (BUSPAR) 7.5 MG tablet; Take 1 tablet (7.5 mg total) by mouth 3 (three) times daily.  Dispense: 90 tablet; Refill: 2  PT reviewed in [place] controlled database- PT only received controlled medications from me  Continue all meds Labs pending Health Maintenance reviewed Diet and exercise encouraged RTO 3 months  Christy Hawks, FNP  

## 2017-04-13 NOTE — Patient Instructions (Signed)

## 2017-04-14 LAB — CMP14+EGFR
A/G RATIO: 1.8 (ref 1.2–2.2)
ALBUMIN: 4.5 g/dL (ref 3.5–4.8)
ALT: 13 IU/L (ref 0–32)
AST: 19 IU/L (ref 0–40)
Alkaline Phosphatase: 96 IU/L (ref 39–117)
BILIRUBIN TOTAL: 0.4 mg/dL (ref 0.0–1.2)
BUN / CREAT RATIO: 25 (ref 12–28)
BUN: 20 mg/dL (ref 8–27)
CHLORIDE: 98 mmol/L (ref 96–106)
CO2: 26 mmol/L (ref 20–29)
Calcium: 10.4 mg/dL — ABNORMAL HIGH (ref 8.7–10.3)
Creatinine, Ser: 0.79 mg/dL (ref 0.57–1.00)
GFR calc non Af Amer: 76 mL/min/{1.73_m2} (ref >59)
GFR, EST AFRICAN AMERICAN: 87 mL/min/{1.73_m2} (ref >59)
Globulin, Total: 2.5 g/dL (ref 1.5–4.5)
Glucose: 129 mg/dL — ABNORMAL HIGH (ref 65–99)
POTASSIUM: 4.5 mmol/L (ref 3.5–5.2)
Sodium: 138 mmol/L (ref 134–144)
TOTAL PROTEIN: 7 g/dL (ref 6.0–8.5)

## 2017-04-16 ENCOUNTER — Ambulatory Visit: Payer: Medicare Other | Admitting: Family

## 2017-04-28 ENCOUNTER — Emergency Department (HOSPITAL_COMMUNITY): Payer: Medicare Other

## 2017-04-28 ENCOUNTER — Encounter (HOSPITAL_COMMUNITY): Payer: Self-pay | Admitting: Nurse Practitioner

## 2017-04-28 ENCOUNTER — Observation Stay (HOSPITAL_COMMUNITY)
Admission: EM | Admit: 2017-04-28 | Discharge: 2017-04-29 | Disposition: A | Discharge status: Home / Self Care | Payer: Medicare Other | Attending: Internal Medicine | Admitting: Internal Medicine

## 2017-04-28 ENCOUNTER — Observation Stay (HOSPITAL_COMMUNITY): Payer: Medicare Other

## 2017-04-28 DIAGNOSIS — Z9119 Patient's noncompliance with other medical treatment and regimen: Secondary | ICD-10-CM | POA: Insufficient documentation

## 2017-04-28 DIAGNOSIS — I480 Paroxysmal atrial fibrillation: Secondary | ICD-10-CM | POA: Insufficient documentation

## 2017-04-28 DIAGNOSIS — Z96659 Presence of unspecified artificial knee joint: Secondary | ICD-10-CM | POA: Insufficient documentation

## 2017-04-28 DIAGNOSIS — Z79899 Other long term (current) drug therapy: Secondary | ICD-10-CM | POA: Diagnosis not present

## 2017-04-28 DIAGNOSIS — Z6841 Body Mass Index (BMI) 40.0 and over, adult: Secondary | ICD-10-CM | POA: Diagnosis not present

## 2017-04-28 DIAGNOSIS — E119 Type 2 diabetes mellitus without complications: Secondary | ICD-10-CM

## 2017-04-28 DIAGNOSIS — F329 Major depressive disorder, single episode, unspecified: Secondary | ICD-10-CM | POA: Insufficient documentation

## 2017-04-28 DIAGNOSIS — R42 Dizziness and giddiness: Secondary | ICD-10-CM | POA: Diagnosis not present

## 2017-04-28 DIAGNOSIS — E785 Hyperlipidemia, unspecified: Secondary | ICD-10-CM | POA: Diagnosis not present

## 2017-04-28 DIAGNOSIS — R0602 Shortness of breath: Secondary | ICD-10-CM | POA: Insufficient documentation

## 2017-04-28 DIAGNOSIS — R072 Precordial pain: Secondary | ICD-10-CM | POA: Diagnosis not present

## 2017-04-28 DIAGNOSIS — R7989 Other specified abnormal findings of blood chemistry: Secondary | ICD-10-CM

## 2017-04-28 DIAGNOSIS — G4733 Obstructive sleep apnea (adult) (pediatric): Secondary | ICD-10-CM | POA: Diagnosis not present

## 2017-04-28 DIAGNOSIS — I700 Atherosclerosis of aorta: Secondary | ICD-10-CM | POA: Insufficient documentation

## 2017-04-28 DIAGNOSIS — E039 Hypothyroidism, unspecified: Secondary | ICD-10-CM | POA: Diagnosis present

## 2017-04-28 DIAGNOSIS — R51 Headache: Secondary | ICD-10-CM | POA: Diagnosis not present

## 2017-04-28 DIAGNOSIS — Z66 Do not resuscitate: Secondary | ICD-10-CM | POA: Insufficient documentation

## 2017-04-28 DIAGNOSIS — E1165 Type 2 diabetes mellitus with hyperglycemia: Secondary | ICD-10-CM | POA: Diagnosis not present

## 2017-04-28 DIAGNOSIS — R079 Chest pain, unspecified: Secondary | ICD-10-CM | POA: Diagnosis present

## 2017-04-28 DIAGNOSIS — S0990XA Unspecified injury of head, initial encounter: Secondary | ICD-10-CM | POA: Diagnosis not present

## 2017-04-28 DIAGNOSIS — R0902 Hypoxemia: Secondary | ICD-10-CM | POA: Diagnosis not present

## 2017-04-28 DIAGNOSIS — I10 Essential (primary) hypertension: Secondary | ICD-10-CM | POA: Insufficient documentation

## 2017-04-28 DIAGNOSIS — R0789 Other chest pain: Secondary | ICD-10-CM | POA: Diagnosis not present

## 2017-04-28 DIAGNOSIS — R791 Abnormal coagulation profile: Secondary | ICD-10-CM | POA: Insufficient documentation

## 2017-04-28 DIAGNOSIS — S4992XA Unspecified injury of left shoulder and upper arm, initial encounter: Secondary | ICD-10-CM | POA: Diagnosis not present

## 2017-04-28 DIAGNOSIS — Z7982 Long term (current) use of aspirin: Secondary | ICD-10-CM | POA: Insufficient documentation

## 2017-04-28 DIAGNOSIS — I4891 Unspecified atrial fibrillation: Secondary | ICD-10-CM | POA: Diagnosis present

## 2017-04-28 DIAGNOSIS — M25512 Pain in left shoulder: Secondary | ICD-10-CM | POA: Diagnosis not present

## 2017-04-28 DIAGNOSIS — I2510 Atherosclerotic heart disease of native coronary artery without angina pectoris: Secondary | ICD-10-CM | POA: Diagnosis not present

## 2017-04-28 HISTORY — DX: Atherosclerotic heart disease of native coronary artery without angina pectoris: I25.10

## 2017-04-28 LAB — HEPATIC FUNCTION PANEL
ALBUMIN: 3.5 g/dL (ref 3.5–5.0)
ALT: 20 U/L (ref 14–54)
AST: 21 U/L (ref 15–41)
Alkaline Phosphatase: 82 U/L (ref 38–126)
Bilirubin, Direct: <0.1 mg/dL — ABNORMAL LOW (ref 0.1–0.5)
TOTAL PROTEIN: 6.5 g/dL (ref 6.5–8.1)
Total Bilirubin: 0.4 mg/dL (ref 0.3–1.2)

## 2017-04-28 LAB — URINALYSIS, ROUTINE W REFLEX MICROSCOPIC
BACTERIA UA: NONE SEEN
Bilirubin Urine: NEGATIVE
Glucose, UA: NEGATIVE mg/dL
KETONES UR: NEGATIVE mg/dL
Nitrite: NEGATIVE
PH: 5 (ref 5.0–8.0)
PROTEIN: NEGATIVE mg/dL
SPECIFIC GRAVITY, URINE: 1.009 (ref 1.005–1.030)

## 2017-04-28 LAB — BASIC METABOLIC PANEL
Anion gap: 9 (ref 5–15)
BUN: 14 mg/dL (ref 6–20)
CO2: 28 mmol/L (ref 22–32)
CREATININE: 0.77 mg/dL (ref 0.44–1.00)
Calcium: 9.5 mg/dL (ref 8.9–10.3)
Chloride: 100 mmol/L — ABNORMAL LOW (ref 101–111)
Glucose, Bld: 155 mg/dL — ABNORMAL HIGH (ref 65–99)
POTASSIUM: 4.3 mmol/L (ref 3.5–5.1)
SODIUM: 137 mmol/L (ref 135–145)

## 2017-04-28 LAB — PROTIME-INR
INR: 1.02
PROTHROMBIN TIME: 13.3 s (ref 11.4–15.2)

## 2017-04-28 LAB — CBC
HCT: 42.1 % (ref 36.0–46.0)
Hemoglobin: 14 g/dL (ref 12.0–15.0)
MCH: 29.7 pg (ref 26.0–34.0)
MCHC: 33.3 g/dL (ref 30.0–36.0)
MCV: 89.2 fL (ref 78.0–100.0)
PLATELETS: 241 10*3/uL (ref 150–400)
RBC: 4.72 MIL/uL (ref 3.87–5.11)
RDW: 12.7 % (ref 11.5–15.5)
WBC: 9.4 10*3/uL (ref 4.0–10.5)

## 2017-04-28 LAB — HEMOGLOBIN A1C
HEMOGLOBIN A1C: 5.3 % (ref 4.8–5.6)
MEAN PLASMA GLUCOSE: 105.41 mg/dL

## 2017-04-28 LAB — I-STAT TROPONIN, ED: Troponin i, poc: 0.01 ng/mL (ref 0.00–0.08)

## 2017-04-28 LAB — LIPASE, BLOOD: LIPASE: 22 U/L (ref 11–51)

## 2017-04-28 LAB — BRAIN NATRIURETIC PEPTIDE: B Natriuretic Peptide: 38.6 pg/mL (ref 0.0–100.0)

## 2017-04-28 LAB — TROPONIN I: Troponin I: <0.03 ng/mL (ref <0.03)

## 2017-04-28 LAB — GLUCOSE, CAPILLARY: GLUCOSE-CAPILLARY: 133 mg/dL — AB (ref 65–99)

## 2017-04-28 LAB — D-DIMER, QUANTITATIVE (NOT AT ARMC): D DIMER QUANT: 0.78 ug{FEU}/mL — AB (ref 0.00–0.50)

## 2017-04-28 MED ORDER — TECHNETIUM TC 99M DIETHYLENETRIAME-PENTAACETIC ACID
32.1000 | Freq: Once | INTRAVENOUS | Status: AC | PRN
  Administered 2017-04-28: 32.1 via RESPIRATORY_TRACT

## 2017-04-28 MED ORDER — ALBUTEROL SULFATE (2.5 MG/3ML) 0.083% IN NEBU: 2.5000 mg | INHALATION_SOLUTION | Freq: Four times a day (QID) | RESPIRATORY_TRACT | Status: DC | PRN

## 2017-04-28 MED ORDER — GI COCKTAIL ~~LOC~~: 30.0000 mL | Freq: Four times a day (QID) | ORAL | Status: DC | PRN

## 2017-04-28 MED ORDER — INSULIN ASPART 100 UNIT/ML ~~LOC~~ SOLN
0.0000 [IU] | Freq: Three times a day (TID) | SUBCUTANEOUS | Status: DC
  Administered 2017-04-29: 1 [IU] via SUBCUTANEOUS

## 2017-04-28 MED ORDER — ACETAMINOPHEN 325 MG PO TABS: 650.0000 mg | ORAL_TABLET | ORAL | Status: DC | PRN

## 2017-04-28 MED ORDER — ASPIRIN EC 81 MG PO TBEC
81.0000 mg | DELAYED_RELEASE_TABLET | Freq: Every day | ORAL | Status: DC
  Administered 2017-04-29: 81 mg via ORAL
  Filled 2017-04-28: qty 1

## 2017-04-28 MED ORDER — ENOXAPARIN SODIUM 40 MG/0.4ML ~~LOC~~ SOLN
40.0000 mg | SUBCUTANEOUS | Status: DC
  Administered 2017-04-28: 40 mg via SUBCUTANEOUS
  Filled 2017-04-28: qty 0.4

## 2017-04-28 MED ORDER — BUSPIRONE HCL 5 MG PO TABS
7.5000 mg | ORAL_TABLET | Freq: Three times a day (TID) | ORAL | Status: DC
  Administered 2017-04-28 – 2017-04-29 (×3): 7.5 mg via ORAL
  Filled 2017-04-28 (×3): qty 2

## 2017-04-28 MED ORDER — TECHNETIUM TO 99M ALBUMIN AGGREGATED
4.3100 | Freq: Once | INTRAVENOUS | Status: AC | PRN
  Administered 2017-04-28: 4.31 via INTRAVENOUS

## 2017-04-28 MED ORDER — FUROSEMIDE 20 MG PO TABS
20.0000 mg | ORAL_TABLET | Freq: Every day | ORAL | Status: DC
Start: 2017-04-29 — End: 2017-04-29
  Administered 2017-04-29: 20 mg via ORAL
  Filled 2017-04-28: qty 1

## 2017-04-28 MED ORDER — FENTANYL CITRATE (PF) 100 MCG/2ML IJ SOLN
50.0000 ug | Freq: Once | INTRAMUSCULAR | Status: AC
  Administered 2017-04-28: 50 ug via INTRAVENOUS
  Filled 2017-04-28: qty 2

## 2017-04-28 MED ORDER — SERTRALINE HCL 100 MG PO TABS
100.0000 mg | ORAL_TABLET | Freq: Every day | ORAL | Status: DC
  Administered 2017-04-28 – 2017-04-29 (×2): 100 mg via ORAL
  Filled 2017-04-28 (×2): qty 1

## 2017-04-28 MED ORDER — ASPIRIN 81 MG PO CHEW
324.0000 mg | CHEWABLE_TABLET | Freq: Once | ORAL | Status: AC
  Administered 2017-04-28: 324 mg via ORAL
  Filled 2017-04-28: qty 4

## 2017-04-28 MED ORDER — HYDROCODONE-ACETAMINOPHEN 7.5-325 MG PO TABS
1.0000 | ORAL_TABLET | Freq: Three times a day (TID) | ORAL | Status: DC | PRN
  Administered 2017-04-28: 1 via ORAL
  Administered 2017-04-29: 2 via ORAL
  Administered 2017-04-29: 1 via ORAL
  Filled 2017-04-28: qty 2
  Filled 2017-04-28 (×2): qty 1

## 2017-04-28 MED ORDER — QUINAPRIL HCL 10 MG PO TABS: 40.0000 mg | ORAL_TABLET | Freq: Every day | ORAL | Status: DC

## 2017-04-28 MED ORDER — ATORVASTATIN CALCIUM 20 MG PO TABS
20.0000 mg | ORAL_TABLET | Freq: Every day | ORAL | Status: DC
  Administered 2017-04-29: 20 mg via ORAL
  Filled 2017-04-28: qty 1

## 2017-04-28 MED ORDER — LISINOPRIL 20 MG PO TABS
40.0000 mg | ORAL_TABLET | Freq: Every day | ORAL | Status: DC
  Administered 2017-04-29: 40 mg via ORAL
  Filled 2017-04-28: qty 2

## 2017-04-28 MED ORDER — ONDANSETRON HCL 4 MG/2ML IJ SOLN: 4.0000 mg | Freq: Four times a day (QID) | INTRAMUSCULAR | Status: DC | PRN

## 2017-04-28 MED ORDER — AMLODIPINE BESYLATE 5 MG PO TABS
5.0000 mg | ORAL_TABLET | Freq: Every day | ORAL | Status: DC
Start: 2017-04-29 — End: 2017-04-29
  Administered 2017-04-29: 5 mg via ORAL
  Filled 2017-04-28: qty 1

## 2017-04-28 MED ORDER — LEVOTHYROXINE SODIUM 50 MCG PO TABS
50.0000 ug | ORAL_TABLET | Freq: Every day | ORAL | Status: DC
  Administered 2017-04-29: 50 ug via ORAL
  Filled 2017-04-28: qty 1

## 2017-04-28 NOTE — ED Notes (Signed)
Pt reports falling 2 months ago in the shower, was not seen at the time. C/o ongoing L shoulder pain and recurrent headaches.

## 2017-04-28 NOTE — ED Notes (Signed)
Patient transported to X-ray 

## 2017-04-28 NOTE — ED Provider Notes (Signed)
[facility] DEPT Provider Note   CSN: [id number] Arrival date & time: 04/28/17  0947     History   Chief Complaint Chief Complaint  Patient presents with  . Chest Pain  . Arm Pain    Left   . Neck Pain    HPI Shirley Leblanc is a 71 y.o. female.  The history is provided by the patient and medical records.  Chest Pain   This is a new problem. The current episode started yesterday. The problem occurs constantly. The problem has been resolved. The pain is present in the substernal region. The pain is at a severity of 10/10. The pain is severe. The quality of the pain is described as pressure-like, heavy and dull. The pain does not radiate. Associated symptoms include diaphoresis, nausea, near-syncope, shortness of breath and vomiting. Pertinent negatives include no abdominal pain, no back pain, no cough, no exertional chest pressure, no fever, no headaches, no irregular heartbeat, no lower extremity edema, no malaise/fatigue, no numbness, no palpitations and no sputum production. She has tried nothing for the symptoms. The treatment provided no relief. There are no known risk factors.  Her past medical history is significant for diabetes.    Past Medical History:  Diagnosis Date  . Bursitis   . Depression   . DJD (degenerative joint disease)   . GERD (gastroesophageal reflux disease)   . Hyperlipemia   . Hypertension   . Left wrist fracture   . Obesities, morbid ([place])   . Open left ankle fracture   . Urinary incontinence     Patient Active Problem List   Diagnosis Date Noted  . Osteopenia 02/02/2017  . Morbid obesity ([place]) 11/13/2016  . Elevated troponin 11/13/2016  . Chronic back pain 10/10/2016  . Pain medication agreement signed 10/10/2016  . Opioid dependence ([place]) 10/10/2016  . Sepsis ([place]) 09/19/2016  . Diabetes ([name]) 09/19/2016  . Depression 04/18/2016  . Hypothyroid 02/22/2016  . Hyperlipidemia associated with type 2 diabetes mellitus ([place]) 02/22/2016  .  Chronic anticoagulation - Eliquis, CHADS2VASC=3 06/05/2015  . Atrial fibrillation ([place]) 05/16/2015  . S/P laparoscopic cholecystectomy   . Essential hypertension 05/09/2015  . Dyspnea on exertion 03/14/2011    Past Surgical History:  Procedure Laterality Date  . CARDIAC CATHETERIZATION    . CHOLECYSTECTOMY N/A 05/14/2015   Procedure: LAPAROSCOPIC CHOLECYSTECTOMY;  Surgeon: Coralie Keens, MD;  Location: [facility];  Service: General;  Laterality: N/A;  . NECK SURGERY    . TOTAL KNEE ARTHROPLASTY     x2  . VAGINAL HYSTERECTOMY      OB History    No data available       Home Medications    Prior to Admission medications   Medication Sig Start Date End Date Taking? Authorizing Provider  albuterol (PROVENTIL HFA;VENTOLIN HFA) 108 (90 Base) MCG/ACT inhaler Inhale 2 puffs into the lungs every 6 (six) hours as needed for wheezing or shortness of breath. 09/22/16   Ghimire, Henreitta Leber, MD  amLODipine (NORVASC) 5 MG tablet Take 1 tablet (5 mg total) by mouth daily. 01/13/17   Evelina Dun A, FNP  aspirin 81 MG EC tablet Take 81 mg by mouth daily.      [provider]  atorvastatin (LIPITOR) 20 MG tablet Take 1 tablet (20 mg total) by mouth daily. 01/13/17   [name], FNP  blood glucose meter kit and supplies KIT Dispense per insurance preference. Use up to four times daily . E 11.9 09/30/16   Evelina Dun  A, FNP  busPIRone (BUSPAR) 7.5 MG tablet Take 1 tablet (7.5 mg total) by mouth 3 (three) times daily. 04/13/17   Evelina Dun A, FNP  Cholecalciferol (VITAMIN D3) 2000 UNITS TABS Take 2,000 Units by mouth daily.     [provider]  furosemide (LASIX) 20 MG tablet Take 1 tablet (20 mg total) by mouth daily. 05/17/15   Modena Jansky, MD  glucose blood (GLUCOSE METER TEST) test strip Use BID 09/29/16   Evelina Dun A, FNP  HYDROcodone-acetaminophen (NORCO) 7.5-325 MG tablet Take 1 tablet by mouth every 8 (eight) hours as needed for moderate pain. Do not fill until  60 days from prescription date 04/13/17   [name], FNP  HYDROcodone-acetaminophen (NORCO) 7.5-325 MG tablet Take 1-2 tablets by mouth every 8 (eight) hours as needed for moderate pain. 04/13/17   [name], FNP  HYDROcodone-acetaminophen (NORCO) 7.5-325 MG tablet Take 1-2 tablets by mouth every 8 (eight) hours as needed for moderate pain. 04/13/17   [name], FNP  levothyroxine (SYNTHROID, LEVOTHROID) 50 MCG tablet Take 1 tablet (50 mcg total) by mouth daily. 01/12/17   [name], FNP  metFORMIN (GLUCOPHAGE) 500 MG tablet Take 1 tablet (500 mg total) by mouth 2 (two) times daily with a meal. Patient not taking: Reported on 04/13/2017 09/26/16   [name], FNP  quinapril (ACCUPRIL) 40 MG tablet Take 1 tablet (40 mg total) by mouth daily. 11/04/16   Evelina Dun A, FNP  sertraline (ZOLOFT) 100 MG tablet TAKE 1 TABLET (100 MG TOTAL) BY MOUTH DAILY. 03/26/17   Evelina Dun A, FNP  sertraline (ZOLOFT) 50 MG tablet Take 3 tablets (150 mg total) by mouth daily. 11/03/16   [name], FNP    Family History Family History  Problem Relation Age of Onset  . Lung disease Mother   . Heart attack Father   . Lung disease Unknown   . Heart attack Unknown   . Cancer Daughter        breast    Social History Social History  Substance Use Topics  . Smoking status: Never Smoker  . Smokeless tobacco: Never Used  . Alcohol use No     Allergies   Contrast media [iodinated diagnostic agents]; Penicillins; Sulfa antibiotics; and Morphine and related   Review of Systems Review of Systems  Constitutional: Positive for diaphoresis. Negative for chills, fatigue, fever and malaise/fatigue.  HENT: Negative for congestion and rhinorrhea.   Eyes: Negative for visual disturbance.  Respiratory: Positive for shortness of breath. Negative for cough, sputum production, chest tightness and wheezing.   Cardiovascular: Positive for chest pain and near-syncope. Negative for  palpitations.  Gastrointestinal: Positive for nausea and vomiting. Negative for abdominal pain, constipation and diarrhea.  Genitourinary: Negative for dysuria, flank pain and frequency.  Musculoskeletal: Negative for back pain, neck pain and neck stiffness.  Skin: Negative for rash and wound.  Neurological: Positive for light-headedness (resolved). Negative for numbness and headaches.  Psychiatric/Behavioral: Negative for agitation.  All other systems reviewed and are negative.    Physical Exam Updated Vital Signs BP 135/90 (BP Location: Left Arm)   Pulse 96   Temp 98.1 F (36.7 C) (Oral)   Resp (!) 22   Ht _0  (1.676 m)   Wt 113.4 kg (250 lb)   SpO2 98%   BMI 40.35 kg/m   Physical Exam  Constitutional: She is oriented to person, place, and time. She appears well-developed and well-nourished. No distress.  HENT:  Head: Normocephalic and atraumatic.  Mouth/Throat: Oropharynx is clear and moist. No oropharyngeal exudate.  Eyes: Pupils are equal, round, and reactive to light. Conjunctivae and EOM are normal.  Neck: Normal range of motion. Neck supple.  Cardiovascular: Normal rate and intact distal pulses.   No murmur heard. Pulmonary/Chest: Effort normal and breath sounds normal. No stridor. No respiratory distress.  Abdominal: Soft. There is no tenderness.  Musculoskeletal: She exhibits no edema or tenderness.  Neurological: She is alert and oriented to person, place, and time. No sensory deficit. She exhibits normal muscle tone.  Skin: Skin is warm and dry. Capillary refill takes less than 2 seconds. No rash noted. She is not diaphoretic.  Psychiatric: She has a normal mood and affect.  Nursing note and vitals reviewed.    ED Treatments / Results  Labs (all labs ordered are listed, but only abnormal results are displayed) Labs Reviewed  BASIC METABOLIC PANEL - Abnormal; Notable for the following:       Result Value   Chloride 100 (*)    Glucose, Bld 155 (*)    All  other components within normal limits  HEPATIC FUNCTION PANEL - Abnormal; Notable for the following:    Bilirubin, Direct <0.1 (*)    All other components within normal limits  D-DIMER, QUANTITATIVE (NOT AT [facility]) - Abnormal; Notable for the following:    D-Dimer, Quant 0.78 (*)    All other components within normal limits  URINALYSIS, ROUTINE W REFLEX MICROSCOPIC - Abnormal; Notable for the following:    Color, Urine STRAW (*)    Hgb urine dipstick SMALL (*)    Leukocytes, UA TRACE (*)    Squamous Epithelial / LPF 0-5 (*)    All other components within normal limits  URINE CULTURE  CBC  PROTIME-INR  LIPASE, BLOOD  BRAIN NATRIURETIC PEPTIDE  TROPONIN I  I-STAT TROPONIN, ED  I-STAT TROPONIN, ED    EKG  EKG Interpretation None       Radiology Dg Chest 2 View  Result Date: 04/28/2017 CLINICAL DATA:  71 year old female status post fall in shower 2 weeks ago. Left side pain, but new onset mid chest pain for the last 2-3 days. EXAM: CHEST  2 VIEW COMPARISON:  10/03/2016 and earlier. FINDINGS: Prior cervical ACDF and thoracic spinal stimulator placement. Mildly lower lung volumes. Stable tortuous thoracic aorta contour. Other mediastinal contours are within normal limits. Visualized tracheal air column is within normal limits. No pneumothorax, pulmonary edema, pleural effusion or confluent pulmonary opacity abdominal Calcified aortic atherosclerosis. Stable cholecystectomy clips. Negative visible bowel gas pattern. Stable visualized osseous structures. IMPRESSION: 1. Lower lung volumes.  No acute cardiopulmonary abnormality. 2.  Aortic Atherosclerosis (ICD10-I70.0). 3. Stable visualized osseous structures. Prior cervical ACDF and thoracic spinal stimulator placement. Electronically Signed   By: Genevie Ann M.D.   On: 04/28/2017 10:22   Ct Head Wo Contrast  Result Date: 04/28/2017 CLINICAL DATA:  Recent fall with headaches, initial encounter EXAM: CT HEAD WITHOUT CONTRAST TECHNIQUE:  Contiguous axial images were obtained from the base of the skull through the vertex without intravenous contrast. COMPARISON:  09/13/2015 FINDINGS: Brain: Mild atrophic changes and chronic white matter ischemic changes are again noted. No findings to suggest acute hemorrhage, acute infarction or space-occupying mass lesion are noted. Vascular: No hyperdense vessel or unexpected calcification. Skull: Normal. Negative for fracture or focal lesion. Sinuses/Orbits: No acute finding. Other: None. IMPRESSION: Chronic changes without acute abnormality. Electronically Signed   By: Linus Mako.D.  On: 04/28/2017 11:55   Dg Shoulder Left  Result Date: 04/28/2017 CLINICAL DATA:  Recent falls. EXAM: LEFT SHOULDER - 2+ VIEW COMPARISON:  Prior chest x-rays from February 2018 FINDINGS: Remote healed humeral head and neck fractures. No definite acute fracture. The [facility] joint is intact. The visualized left lung is clear. No definite acute left rib fractures. IMPRESSION: Remote healed left humeral head and neck fractures. Electronically Signed   By: Marijo Sanes M.D.   On: 04/28/2017 11:43   Dg Humerus Left  Result Date: 04/28/2017 CLINICAL DATA:  [place] 2 months ago and again last week. Left shoulder pain. EXAM: LEFT HUMERUS - 2+ VIEW COMPARISON:  Prior chest x-rays from February 2018 FINDINGS: Remote healed left humeral head and neck fractures. No acute fracture of the humerus is identified. The elbow joint is grossly maintained. IMPRESSION: Remote healed left humeral head and neck fractures. No acute humeral fracture. Electronically Signed   By: Marijo Sanes M.D.   On: 04/28/2017 11:41    Procedures Procedures (including critical care time)  Medications Ordered in ED Medications  aspirin chewable tablet 324 mg (324 mg Oral Given 04/28/17 1233)  fentaNYL (SUBLIMAZE) injection 50 mcg (50 mcg Intravenous Given 04/28/17 1457)     Initial Impression / Assessment and Plan / ED Course  I have reviewed the triage  vital signs and the nursing notes.  Pertinent labs & imaging results that were available during my care of the patient were reviewed by me and considered in my medical decision making (see chart for details).     Shirley Leblanc is a 71 y.o. female with a past medical history significant for hypertension, hyperlipidemia, diabetes, depression, GERD, CHF, and previous atrial fibrillation who presents with left head, left shoulder and left arm pain from a fall as well as a new chest pain. Patient says that 2 months ago she had a fall at home striking her left head, left shoulder and left arm on the ground. She says that she has had pain on and off ever since. She has not been seen by a physician for these injuries. She says her pain has been chronic and moderate in the left temple and left shoulder and left upper arm. She is concerned she may have broken her arm. She has normal sensation and strength in her upper extremities. She reports that hydrocodone pain medicine has helped with her symptoms from the fall.  Patient's primary concern today however is chest pain that began yesterday. She says that it began gradually yesterday and was a pressure-like pain across her central chest. She describes that she had nausea, vomiting, diaphoresis, lightheadedness, and near syncopal episode with it. She says she could not catch her breath. She says that she was too tired to "even get up or walk around". She is unsure if it was pleuritic or exertional. Patient took 4 aspirin and a hydrocodone which, after approximately 20 minutes, helped her symptoms. She says that she has not had the chest pain again today but wanted to be evaluated.  Patient is currently chest pain-free but she still has the left shoulder and left head pain. On my exam, breath sounds were clear. Patient says she's had no or leg pain or leg swelling. Patient's legs were minimally edematous bilaterally. Patient had pulses in all extremity. Patient  tenderness in the left shoulder and left humerus. No bruising seen. Patient had no focal neurologic deficits. Mild tenderness to left temple. Normal extraocular movements without any evidence of entrapment.  Normal pupil exam. Abdomen nontender. Chest slightly tender in the central chest.  Heart score calculated as a 7. Initial EKG showed LVH but no STEMI.  Patient will have imaging to look for traumatic injury from a fall last month but will also have workup to look for etiology of her chest pain and shortness of breath. Patient will have d-dimer to look for PE. Anticipate admission given the high risk chest pain.   X-ray showed evidence of remote and healing left humeral head fracture. Patient will be placed in a sling. Given the patient's neurovascularly intact with normal sensation, strength, coordination, and pulses in the left upper term, patient can follow-up as an outpatient with orthopedics.  Patient's other laboratory testing returned with a negative initial troponin. D-dimer was positive. Patient found to have allergy to contrast. Patient VQ scan.  Cardiology was called and they recommended patient be admitted to medicine service for the VQ scan. They report that they will see the patient and follow her but she does not need to be admitted to the cardiology service. They reviewed the imaging of her previous noncontrasted CT scan and commented that she does have a moderate amount of calcification on her coronary arteries and thus would need a nuclear stress test. They report that the VQ scan would interfere so patient may need to return for nuclear stress test as an outpatient if her symptoms remain resolved.  Hospitalist team will be called for admission.    Final Clinical Impressions(s) / ED Diagnoses   Final diagnoses:  Precordial pain  Hypoxia  Shortness of breath  Positive D dimer    Clinical Impression: 1. Precordial pain   2. Hypoxia   3. Shortness of breath   4.  Positive D dimer     Disposition: Admit to Hospitalist service    Tegeler, Gwenyth Allegra, MD 04/28/17 [phone]

## 2017-04-28 NOTE — ED Notes (Signed)
Please call report to [facility] at [phone] at [facility]

## 2017-04-28 NOTE — ED Triage Notes (Addendum)
Patient reports falling  In the shower about 2 weeks ago and over on her left side. States left shoulder has been sore since then. Over the past 2-3 days patient has been experiencing intermittent chest pain. Reports it feels more of a soreness that shoots down her left arm. Also reports neck being sore. Patient is with daughter who reports she noticed she was having some shortness of breath and dizziness this morning with the chest pain. EMS came out to the house yesterday evening but patient refused to come to ER. Per ems all vitals were ok other than elevated bp. Daughter insisted on her coming in this morning since patient began complaining of more frequent chest pain and shortness of breath. Denies taking any OTC medications for symptoms. Does have a hx of htn and CHF. She is currently being managed by cardiology. Patient takes hydrcodone for back pain. Reports taking that at 0900.

## 2017-04-28 NOTE — H&P (Addendum)
TRH H&P    Patient Demographics:    Shirley Leblanc, is a 71 y.o. female  MRN: [id number]  DOB - [birth year]  Admit Date - 04/28/2017  Referring MD/NP/PA: Dr Tegeler  Outpatient Primary MD for the patient is Shirley Balloon, FNP  Patient coming from: Home  Chief Complaint  Patient presents with  . Chest Pain  . Arm Pain    Left   . Neck Pain      HPI:    Shirley Leblanc  is a 71 y.o. female, With history of asthma, sleep apnea, hypertension, hyperlipidemia, GERD, diabetes mellitus who came to hospital with one episode of crushing chest pain that started yesterday while she was watching TV. The pain was under her ribs bilaterally which lasted about 15 minutes. There was no radiation. It was associated with some sweating. No shortness of breath. She denies chest pain at this time. No nausea vomiting or diarrhea. She does have history of GERD. No fever or chills. No dysuria. No abdominal pain.  EKG showed changes of LVH.    Review of systems:    In addition to the HPI above,    All other systems reviewed and are negative.   With Past History of the following :    Past Medical History:  Diagnosis Date  . Bursitis   . Depression   . DJD (degenerative joint disease)   . GERD (gastroesophageal reflux disease)   . Hyperlipemia   . Hypertension   . Left wrist fracture   . Obesities, morbid ([place])   . Open left ankle fracture   . Urinary incontinence       Past Surgical History:  Procedure Laterality Date  . CARDIAC CATHETERIZATION    . CHOLECYSTECTOMY N/A 05/14/2015   Procedure: LAPAROSCOPIC CHOLECYSTECTOMY;  Surgeon: Coralie Keens, MD;  Location: [facility];  Service: General;  Laterality: N/A;  . NECK SURGERY    . TOTAL KNEE ARTHROPLASTY     x2  . VAGINAL HYSTERECTOMY        Social History:      Social History  Substance Use Topics  . Smoking status: Never Smoker  . Smokeless tobacco:  Never Used  . Alcohol use No       Family History :     Family History  Problem Relation Age of Onset  . Lung disease Mother   . Heart attack Father   . Lung disease Unknown   . Heart attack Unknown   . Cancer Daughter        breast      Home Medications:   Prior to Admission medications   Medication Sig Start Date End Date Taking? Authorizing Provider  albuterol (PROVENTIL HFA;VENTOLIN HFA) 108 (90 Base) MCG/ACT inhaler Inhale 2 puffs into the lungs every 6 (six) hours as needed for wheezing or shortness of breath. 09/22/16  Yes Ghimire, Henreitta Leber, MD  amLODipine (NORVASC) 5 MG tablet Take 1 tablet (5 mg total) by mouth daily. 01/13/17  Yes Evelina Dun A, FNP  aspirin 81 MG EC tablet Take  81 mg by mouth daily.     Yes [provider]  atorvastatin (LIPITOR) 20 MG tablet Take 1 tablet (20 mg total) by mouth daily. 01/13/17  Yes Hawks, Christy A, FNP  busPIRone (BUSPAR) 7.5 MG tablet Take 1 tablet (7.5 mg total) by mouth 3 (three) times daily. 04/13/17  Yes Hawks, Christy A, FNP  Cholecalciferol (VITAMIN D3) 2000 UNITS TABS Take 2,000 Units by mouth daily.    Yes [provider]  furosemide (LASIX) 20 MG tablet Take 1 tablet (20 mg total) by mouth daily. 05/17/15  Yes Hongalgi, Lenis Dickinson, MD  HYDROcodone-acetaminophen (NORCO) 7.5-325 MG tablet Take 1-2 tablets by mouth every 8 (eight) hours as needed for moderate pain. 04/13/17  Yes Hawks, Christy A, FNP  levothyroxine (SYNTHROID, LEVOTHROID) 50 MCG tablet Take 1 tablet (50 mcg total) by mouth daily. 01/12/17  Yes Hawks, Christy A, FNP  quinapril (ACCUPRIL) 40 MG tablet Take 1 tablet (40 mg total) by mouth daily. 11/04/16  Yes Hawks, Christy A, FNP  sertraline (ZOLOFT) 100 MG tablet TAKE 1 TABLET (100 MG TOTAL) BY MOUTH DAILY. 03/26/17  Yes Hawks, Christy A, FNP  blood glucose meter kit and supplies KIT Dispense per insurance preference. Use up to four times daily . E 11.9 09/30/16   Evelina Dun A, FNP  glucose blood  (GLUCOSE METER TEST) test strip Use BID 09/29/16   Evelina Dun A, FNP  HYDROcodone-acetaminophen (NORCO) 7.5-325 MG tablet Take 1 tablet by mouth every 8 (eight) hours as needed for moderate pain. Do not fill until 60 days from prescription date Patient not taking: Reported on 04/28/2017 04/13/17   Shirley Balloon, FNP  HYDROcodone-acetaminophen (NORCO) 7.5-325 MG tablet Take 1-2 tablets by mouth every 8 (eight) hours as needed for moderate pain. Patient not taking: Reported on 04/28/2017 04/13/17   Shirley Balloon, FNP  metFORMIN (GLUCOPHAGE) 500 MG tablet Take 1 tablet (500 mg total) by mouth 2 (two) times daily with a meal. Patient not taking: Reported on 04/13/2017 09/26/16   Shirley Balloon, FNP  sertraline (ZOLOFT) 50 MG tablet Take 3 tablets (150 mg total) by mouth daily. Patient not taking: Reported on 04/28/2017 11/03/16   Shirley Balloon, FNP     Allergies:     Allergies  Allergen Reactions  . Contrast Media [Iodinated Diagnostic Agents] Anaphylaxis       . Penicillins Hives, Itching and Rash    Has patient had a PCN reaction causing immediate rash, facial/tongue/throat swelling, SOB or lightheadedness with hypotension Yes Has patient had a PCN reaction causing severe rash involving mucus membranes or skin necrosis: Yes Has patient had a PCN reaction that required hospitalization No Has patient had a PCN reaction occurring within the last 10 years: No If all of the above answers are "NO", then may proceed with Cephalosporin use.   . Sulfa Antibiotics Hives, Itching and Rash  . Morphine And Related Itching     Physical Exam:   Vitals  Blood pressure 117/69, pulse 79, temperature 98.1 F (36.7 C), temperature source Oral, resp. rate 19, height 5' 6"  (1.676 m), weight 113.4 kg (250 lb), SpO2 94 %.  1.  General: Appears in no acute distress  2. Psychiatric:  Intact judgement and  insight, awake alert, oriented x 3.  3. Neurologic: No focal neurological deficits, all  cranial nerves intact.Strength 5/5 all 4 extremities, sensation intact all 4 extremities, plantars down going.  4. Eyes :  anicteric sclerae, moist conjunctivae with no lid lag. PERRLA.  5. ENMT:  Oropharynx clear with moist mucous membranes and good dentition  6. Neck:  supple, no cervical lymphadenopathy appriciated, No thyromegaly  7. Respiratory : Normal respiratory effort, good air movement bilaterally,clear to  auscultation bilaterally  8. Cardiovascular : RRR, no gallops, rubs or murmurs, trace edema bilateral lower extremities  9. Gastrointestinal:  Positive bowel sounds, abdomen soft, non-tender to palpation,no hepatosplenomegaly, no rigidity or guarding       10. Skin:  No cyanosis, normal texture and turgor, no rash, lesions or ulcers  11.Musculoskeletal:  Left shoulder in sling    Data Review:    CBC  Recent Labs Lab 04/28/17 1025  WBC 9.4  HGB 14.0  HCT 42.1  PLT 241  MCV 89.2  MCH 29.7  MCHC 33.3  RDW 12.7   ------------------------------------------------------------------------------------------------------------------  Chemistries   Recent Labs Lab 04/28/17 1025 04/28/17 1210  NA 137  --   K 4.3  --   CL 100*  --   CO2 28  --   GLUCOSE 155*  --   BUN 14  --   CREATININE 0.77  --   CALCIUM 9.5  --   AST  --  21  ALT  --  20  ALKPHOS  --  82  BILITOT  --  0.4   ------------------------------------------------------------------------------------------------------------------  ------------------------------------------------------------------------------------------------------------------ GFR: Estimated Creatinine Clearance: 82.4 mL/min (by C-G formula based on SCr of 0.77 mg/dL). Liver Function Tests:  Recent Labs Lab 04/28/17 1210  AST 21  ALT 20  ALKPHOS 82  BILITOT 0.4  PROT 6.5  ALBUMIN 3.5    Recent Labs Lab 04/28/17 1210  LIPASE 22   No results for input(s): AMMONIA in the last 168 hours. Coagulation  Profile:  Recent Labs Lab 04/28/17 1210  INR 1.02   Cardiac Enzymes:  Recent Labs Lab 04/28/17 1210  TROPONINI <0.03    --------------------------------------------------------------------------------------------------------------- Urine analysis:    Component Value Date/Time   COLORURINE STRAW (A) 04/28/2017 1210   APPEARANCEUR CLEAR 04/28/2017 1210   APPEARANCEUR Clear 09/18/2016 1627   LABSPEC 1.009 04/28/2017 1210   PHURINE 5.0 04/28/2017 1210   GLUCOSEU NEGATIVE 04/28/2017 1210   HGBUR SMALL (A) 04/28/2017 1210   BILIRUBINUR NEGATIVE 04/28/2017 1210   BILIRUBINUR Negative 09/18/2016 1627   KETONESUR NEGATIVE 04/28/2017 1210   PROTEINUR NEGATIVE 04/28/2017 1210   UROBILINOGEN 0.2 05/09/2015 1033   NITRITE NEGATIVE 04/28/2017 1210   LEUKOCYTESUR TRACE (A) 04/28/2017 1210   LEUKOCYTESUR Negative 09/18/2016 1627      Imaging Results:    Dg Chest 2 View  Result Date: 04/28/2017 CLINICAL DATA:  71 year old female status post fall in shower 2 weeks ago. Left side pain, but new onset mid chest pain for the last 2-3 days. EXAM: CHEST  2 VIEW COMPARISON:  10/03/2016 and earlier. FINDINGS: Prior cervical ACDF and thoracic spinal stimulator placement. Mildly lower lung volumes. Stable tortuous thoracic aorta contour. Other mediastinal contours are within normal limits. Visualized tracheal air column is within normal limits. No pneumothorax, pulmonary edema, pleural effusion or confluent pulmonary opacity abdominal Calcified aortic atherosclerosis. Stable cholecystectomy clips. Negative visible bowel gas pattern. Stable visualized osseous structures. IMPRESSION: 1. Lower lung volumes.  No acute cardiopulmonary abnormality. 2.  Aortic Atherosclerosis (ICD10-I70.0). 3. Stable visualized osseous structures. Prior cervical ACDF and thoracic spinal stimulator placement. Electronically Signed   By: Genevie Ann M.D.   On: 04/28/2017 10:22   Ct Head Wo Contrast  Result Date:  04/28/2017 CLINICAL DATA:  Recent fall with headaches, initial encounter EXAM: CT  HEAD WITHOUT CONTRAST TECHNIQUE: Contiguous axial images were obtained from the base of the skull through the vertex without intravenous contrast. COMPARISON:  09/13/2015 FINDINGS: Brain: Mild atrophic changes and chronic white matter ischemic changes are again noted. No findings to suggest acute hemorrhage, acute infarction or space-occupying mass lesion are noted. Vascular: No hyperdense vessel or unexpected calcification. Skull: Normal. Negative for fracture or focal lesion. Sinuses/Orbits: No acute finding. Other: None. IMPRESSION: Chronic changes without acute abnormality. Electronically Signed   By: Inez Catalina M.D.   On: 04/28/2017 11:55   Dg Shoulder Left  Result Date: 04/28/2017 CLINICAL DATA:  Recent falls. EXAM: LEFT SHOULDER - 2+ VIEW COMPARISON:  Prior chest x-rays from February 2018 FINDINGS: Remote healed humeral head and neck fractures. No definite acute fracture. The [facility] joint is intact. The visualized left lung is clear. No definite acute left rib fractures. IMPRESSION: Remote healed left humeral head and neck fractures. Electronically Signed   By: Marijo Sanes M.D.   On: 04/28/2017 11:43   Dg Humerus Left  Result Date: 04/28/2017 CLINICAL DATA:  [place] 2 months ago and again last week. Left shoulder pain. EXAM: LEFT HUMERUS - 2+ VIEW COMPARISON:  Prior chest x-rays from February 2018 FINDINGS: Remote healed left humeral head and neck fractures. No acute fracture of the humerus is identified. The elbow joint is grossly maintained. IMPRESSION: Remote healed left humeral head and neck fractures. No acute humeral fracture. Electronically Signed   By: Marijo Sanes M.D.   On: 04/28/2017 11:41    My personal review of EKG: Rhythm NSR, LVH   Assessment & Plan:    Active Problems:   Atrial fibrillation (HCC)   Hypothyroid   Diabetes ([place])   Morbid obesity ([place])   Chest pain   1. Chest pain- will place  under observation, cardiac cath in 2007 showed single-vessel CAD proximal LAD had 40% narrowing and all other coronary arteries appeared normal. We will cycle troponin every 6 hours 3. Cardiology has been consulted by ED physician. 2. Elevated d-dimer- patient is requiring oxygen, though she does have history of asthma, chronic dyspnea, sleep apnea, chronic diastole is CHF. We will obtain VQ scan, as she has allergy to contrast media. 3. Left humeral fracture- status post fall in the bathroom 2 weeks ago x-ray showed remote healed left humeral fracture and has been placed in sling. She can follow-up with orthopedic surgery as outpatient. 4. Paroxysmal atrial fibrillation- CHA2DS2VASc score is 2. Patient has refused anticoagulant in the past. Continue aspirin 5. Diabetes mellitus-start sliding scale insulin with NovoLog. 6. Hypertension-blood pressure stable, continue Norvasc, Accupril 7. Hypothyroidism-continue Synthroid   DVT Prophylaxis-   Lovenox   AM Labs Ordered, also please review Full Orders  Family Communication: Admission, patients condition and plan of care including tests being ordered have been discussed with the patient and her granddaughter at bedside who indicate understanding and agree with the plan and Code Status.  Code Status:  DO NOT RESUSCITATE  Admission status: Observation    Time spent in minutes : 60 minutes   Kachina Niederer S M.D on 04/28/2017 at 5:35 PM  Between 7am to 7pm - Pager - [phone]. After 7pm go to [email or web address] - password [email or web address]  [facility] - Office  [phone]

## 2017-04-29 ENCOUNTER — Encounter (HOSPITAL_COMMUNITY): Payer: Self-pay | Admitting: Student

## 2017-04-29 ENCOUNTER — Encounter: Payer: Self-pay | Admitting: Cardiology

## 2017-04-29 ENCOUNTER — Other Ambulatory Visit: Payer: Self-pay | Admitting: Student

## 2017-04-29 ENCOUNTER — Observation Stay (HOSPITAL_COMMUNITY): Payer: Medicare Other

## 2017-04-29 ENCOUNTER — Encounter: Payer: Self-pay | Admitting: Family

## 2017-04-29 ENCOUNTER — Other Ambulatory Visit: Payer: Self-pay | Admitting: Family

## 2017-04-29 DIAGNOSIS — R42 Dizziness and giddiness: Secondary | ICD-10-CM | POA: Diagnosis not present

## 2017-04-29 DIAGNOSIS — I2510 Atherosclerotic heart disease of native coronary artery without angina pectoris: Secondary | ICD-10-CM | POA: Diagnosis not present

## 2017-04-29 DIAGNOSIS — Z6841 Body Mass Index (BMI) 40.0 and over, adult: Secondary | ICD-10-CM | POA: Diagnosis not present

## 2017-04-29 DIAGNOSIS — I480 Paroxysmal atrial fibrillation: Secondary | ICD-10-CM

## 2017-04-29 DIAGNOSIS — E785 Hyperlipidemia, unspecified: Secondary | ICD-10-CM | POA: Diagnosis not present

## 2017-04-29 DIAGNOSIS — I10 Essential (primary) hypertension: Secondary | ICD-10-CM | POA: Diagnosis not present

## 2017-04-29 DIAGNOSIS — R0602 Shortness of breath: Secondary | ICD-10-CM | POA: Diagnosis not present

## 2017-04-29 DIAGNOSIS — I25119 Atherosclerotic heart disease of native coronary artery with unspecified angina pectoris: Secondary | ICD-10-CM

## 2017-04-29 DIAGNOSIS — R0789 Other chest pain: Secondary | ICD-10-CM

## 2017-04-29 DIAGNOSIS — R079 Chest pain, unspecified: Secondary | ICD-10-CM | POA: Diagnosis not present

## 2017-04-29 DIAGNOSIS — R0902 Hypoxemia: Secondary | ICD-10-CM | POA: Diagnosis not present

## 2017-04-29 DIAGNOSIS — E119 Type 2 diabetes mellitus without complications: Secondary | ICD-10-CM | POA: Diagnosis not present

## 2017-04-29 DIAGNOSIS — G4733 Obstructive sleep apnea (adult) (pediatric): Secondary | ICD-10-CM | POA: Diagnosis not present

## 2017-04-29 LAB — URINE CULTURE

## 2017-04-29 LAB — GLUCOSE, CAPILLARY
GLUCOSE-CAPILLARY: 118 mg/dL — AB (ref 65–99)
GLUCOSE-CAPILLARY: 117 mg/dL — AB (ref 65–99)
Glucose-Capillary: 124 mg/dL — ABNORMAL HIGH (ref 65–99)

## 2017-04-29 LAB — TROPONIN I
Troponin I: <0.03 ng/mL (ref <0.03)
Troponin I: <0.03 ng/mL (ref <0.03)

## 2017-04-29 NOTE — Care Management Obs Status (Signed)
[facility] NOTIFICATION   Patient Details  Name: Shirley Leblanc MRN: [id number] Date of Birth: 1946/05/28   Medicare Observation Status Notification Given:  Yes    Purcell Mouton, RN 04/29/2017, 1:13 PM

## 2017-04-29 NOTE — Discharge Summary (Signed)
Physician Discharge Summary  Shirley Leblanc HLK:[id number] DOB: Dec 02, 1945 DOA: 04/28/2017  PCP: [name], FNP  Admit date: 04/28/2017 Discharge date: 04/29/2017  Recommendations for Outpatient Follow-up:  1. Pt will need to follow up with PCP in 2-3 weeks post discharge 2. Please obtain BMP to evaluate electrolytes and kidney function 3. Please also check CBC to evaluate Hg and Hct levels  Discharge Diagnoses:  Active Problems:   Atrial fibrillation (HCC)   Hypothyroid   Diabetes ([place])   Morbid obesity ([place])   Chest pain  Discharge Condition: Stable  Diet recommendation: Heart healthy diet discussed in details   History of present illness:  Pt is 71 yo female with PAF but declined AC, CAD, HLD, HTN, hypothyroidism, OSA but hx of reported non compliance with CPAP), recent admission for resp failure in the setting of influenza, recent mechanical fall about two weeks prior to this admission, presented for evaluation of substernal chest pain.   Hospital Course:  Atypical Chest Pain/ Known CAD - no evidence of PE on V/Q scan - cardiac enzymes negative  - no need for ECHO as one was recently done earlier this year  - cardiology consulted and recommends outpatient stress test - pt with no CP  - continue aspirin and statin   Hypoxia, known OSA - possibly related to the above but now very clear - pt also with known OSA but not clear if this is contributing as pt has not been medically compliant with CPAP - will need home oxygen   HTN, essential - reasonable inpatient control   HLD - continue statin   DM - continue home medical regimen   PAF - has declined AC  Morbid obesity - Body mass index is 40.35 kg/m.   Procedures/Studies: Dg Chest 2 View Result Date: 04/28/2017 Lower lung volumes.  No acute cardiopulmonary abnormality. 2.  Aortic Atherosclerosis (ICD10-I70.0). 3. Stable visualized osseous structures. Prior cervical ACDF and thoracic spinal stimulator  placement.   Ct Head Wo Contrast Result Date: 04/28/2017 Chronic changes without acute abnormality.   Nm Pulmonary Perf And Vent Result Date: 04/28/2017 Normal VQ scan without evidence of pulmonary embolism.  Dg Shoulder Left Result Date: 04/28/2017 Remote healed left humeral head and neck fractures.   Dg Humerus Left Result Date: 04/28/2017 Remote healed left humeral head and neck fractures. No acute humeral fracture.  Consultations:  Cardiology   Antibiotics:  None  Discharge Exam: Vitals:   04/29/17 0143 04/29/17 0610  BP: 136/68 125/65  Pulse: 75 75  Resp: 20 20  Temp: 98.5 F (36.9 C) 97.9 F (36.6 C)  SpO2: 98% 97%   Vitals:   04/28/17 1906 04/28/17 2216 04/29/17 0143 04/29/17 0610  BP: (!) 131/100 107/72 136/68 125/65  Pulse: 83 74 75 75  Resp: _0 Temp: 98.1 F (36.7 C) 98.2 F (36.8 C) 98.5 F (36.9 C) 97.9 F (36.6 C)  TempSrc: Oral Oral Oral Oral  SpO2: 93% 94% 98% 97%  Weight:      Height:        General: Pt is alert, follows commands appropriately, not in acute distress Cardiovascular: Regular rate and rhythm, S1/S2 +, no murmurs, no rubs, no gallops Respiratory: Clear to auscultation bilaterally, no wheezing, no crackles, no rhonchi Abdominal: Soft, non tender, non distended, bowel sounds +, no guarding  Discharge Instructions   Allergies as of 04/29/2017      Reactions   Contrast Media [iodinated Diagnostic Agents] Anaphylaxis  Penicillins Hives, Itching, Rash   Has patient had a PCN reaction causing immediate rash, facial/tongue/throat swelling, SOB or lightheadedness with hypotension Yes Has patient had a PCN reaction causing severe rash involving mucus membranes or skin necrosis: Yes Has patient had a PCN reaction that required hospitalization No Has patient had a PCN reaction occurring within the last 10 years: No If all of the above answers are "NO", then may proceed with Cephalosporin use.   Sulfa Antibiotics Hives,  Itching, Rash   Morphine And Related Itching      Medication List    TAKE these medications   albuterol 108 (90 Base) MCG/ACT inhaler Commonly known as:  PROVENTIL HFA;VENTOLIN HFA Inhale 2 puffs into the lungs every 6 (six) hours as needed for wheezing or shortness of breath.   amLODipine 5 MG tablet Commonly known as:  NORVASC Take 1 tablet (5 mg total) by mouth daily.   aspirin 81 MG EC tablet Take 81 mg by mouth daily.   atorvastatin 20 MG tablet Commonly known as:  LIPITOR Take 1 tablet (20 mg total) by mouth daily.   blood glucose meter kit and supplies Kit Dispense per insurance preference. Use up to four times daily . E 11.9   busPIRone 7.5 MG tablet Commonly known as:  BUSPAR Take 1 tablet (7.5 mg total) by mouth 3 (three) times daily.   furosemide 20 MG tablet Commonly known as:  LASIX Take 1 tablet (20 mg total) by mouth daily.   glucose blood test strip Commonly known as:  GLUCOSE METER TEST Use BID   HYDROcodone-acetaminophen 7.5-325 MG tablet Commonly known as:  NORCO Take 1-2 tablets by mouth every 8 (eight) hours as needed for moderate pain. What changed:  Another medication with the same name was removed. Continue taking this medication, and follow the directions you see here.   levothyroxine 50 MCG tablet Commonly known as:  SYNTHROID, LEVOTHROID Take 1 tablet (50 mcg total) by mouth daily.   metFORMIN 500 MG tablet Commonly known as:  GLUCOPHAGE Take 1 tablet (500 mg total) by mouth 2 (two) times daily with a meal.   quinapril 40 MG tablet Commonly known as:  ACCUPRIL TAKE 1 TABLET (40 MG TOTAL) BY MOUTH DAILY.   sertraline 100 MG tablet Commonly known as:  ZOLOFT TAKE 1 TABLET (100 MG TOTAL) BY MOUTH DAILY. What changed:  Another medication with the same name was removed. Continue taking this medication, and follow the directions you see here.   Vitamin D3 2000 units Tabs Take 2,000 Units by mouth daily.      Follow-up Information     [name], FNP Follow up.   Specialty:  Family Medicine Contact information: [place] [address] [phone]        Sanda Klein, MD .   Specialty:  Cardiology Contact information: [address] [place] [address] [phone]            The results of significant diagnostics from this hospitalization (including imaging, microbiology, ancillary and laboratory) are listed below for reference.     Microbiology: Recent Results (from the past 240 hour(s))  Urine culture     Status: Abnormal   Collection Time: 04/28/17 12:10 PM  Result Value Ref Range Status   Specimen Description URINE, RANDOM  Final   Special Requests NONE  Final   Culture MULTIPLE SPECIES PRESENT, SUGGEST RECOLLECTION (A)  Final   Report Status 04/29/2017 FINAL  Final  Labs: Basic Metabolic Panel:  Recent Labs Lab 04/28/17 1025  NA 137  K 4.3  CL 100*  CO2 28  GLUCOSE 155*  BUN 14  CREATININE 0.77  CALCIUM 9.5   Liver Function Tests:  Recent Labs Lab 04/28/17 1210  AST 21  ALT 20  ALKPHOS 82  BILITOT 0.4  PROT 6.5  ALBUMIN 3.5    Recent Labs Lab 04/28/17 1210  LIPASE 22   CBC:  Recent Labs Lab 04/28/17 1025  WBC 9.4  HGB 14.0  HCT 42.1  MCV 89.2  PLT 241   Cardiac Enzymes:  Recent Labs Lab 04/28/17 1210 04/28/17 1911 04/29/17 0057 04/29/17 0729  TROPONINI <0.03 <0.03 <0.03 <0.03   BNP (last 3 results)  Recent Labs  09/19/16 0406 04/28/17 1210  BNP 44.8 38.6   CBG:  Recent Labs Lab 04/28/17 2212 04/29/17 0804 04/29/17 1139  GLUCAP 133* 118* 117*   SIGNED: Time coordinating discharge: 50 minutes  Faye Ramsay, MD  [facility] 04/29/2017, 1:17 PM Pager [phone]  If 7PM-7AM, please contact night-coverage [email or web address] Password [email or web address]

## 2017-04-29 NOTE — Consult Note (Signed)
Cardiology Consult    Patient ID: CAMIYAH FRIBERG; [id number]; 07-Apr-1946   Admit date: 04/28/2017 Date of Consult: 04/29/2017  Primary Care Provider: [name], FNP Primary Cardiologist: Dr. Percival Spanish   Patient Profile    Shirley Leblanc is a 71 y.o. female with past medical history of PAF (declines anticoagulation), CAD (nonobstructive CAD with 40% Prox LAD stenosis by cath in 2007), HTN, HLD, Hypothyroidism, OSA (noncompliant with CPAP), obesity and GERD who is being seen today for the evaluation of chest pain at the request of Dr. Darrick Meigs.   History of Present Illness    Shirley Leblanc was last examined by Dr. Percival Spanish in 10/2016 following a recent admission for respiratory failure in the setting of sepsis secondary to influenza. During admission, she was found to have minimally elevated troponin values (peaking at 0.40). No further ischemic evaluation was pursued as she denied any recent chest discomfort and a recent NST in 04/2015 was low-Leblanc.   She presented to [facility] ED on 04/28/2017 for evaluation after falling in her shower approximately 2 weeks ago. She reports she lost her balance and this was a mechanical fall. No associated dizziness, lightheadedness, palpitations, or presyncope.   Yesterday morning, she noted continued soreness along her left shoulder from her recent fall but also experienced a shooting pain along her epigastric region. This occurred while sitting in her chair and symptoms resolved after she took her Hydrocodone. No associated dyspnea at that time. Was mildly nauseated after taking her medication. No association with exertion. She denies any recent episodes of chest pain on exertion. Has baseline dyspnea on exertion and has qualified for home O2 in the past but her daughter reports she does not use this. No recent orthopnea, PND, or lower extremity edema. Noncompliant with her CPAP.  Initial labs show WBC of 9.4, Hgb 14.0, platelets 241, K+ 4.3, creatinine 0.77, and  d-dimer 0.78. BNP 38.6. Initial and cyclic troponin values have been negative. CXR showed no acute cardiopulmonary abnormalities. CT Head showing chronic changes but no acute findings. VQ Scan without evidence of a PE. No EKG is scanned into the system.    Past Medical History   Past Medical History:  Diagnosis Date  . Bursitis   . CAD (coronary artery disease)    a. nonobstructive CAD with 40% Prox LAD stenosis by cath in 2007  . Depression   . DJD (degenerative joint disease)   . GERD (gastroesophageal reflux disease)   . Hyperlipemia   . Hypertension   . Left wrist fracture   . Obesities, morbid ([place])   . Open left ankle fracture   . Urinary incontinence      Allergies:   Allergies  Allergen Reactions  . Contrast Media [Iodinated Diagnostic Agents] Anaphylaxis       . Penicillins Hives, Itching and Rash    Has patient had a PCN reaction causing immediate rash, facial/tongue/throat swelling, SOB or lightheadedness with hypotension Yes Has patient had a PCN reaction causing severe rash involving mucus membranes or skin necrosis: Yes Has patient had a PCN reaction that required hospitalization No Has patient had a PCN reaction occurring within the last 10 years: No If all of the above answers are "NO", then may proceed with Cephalosporin use.   . Sulfa Antibiotics Hives, Itching and Rash  . Morphine And Related Itching    Home Medications:   Home Medications:  Prior to Admission medications   Medication Sig Start Date End Date Taking? Authorizing Provider  albuterol (PROVENTIL HFA;VENTOLIN HFA) 108 (90 Base) MCG/ACT inhaler Inhale 2 puffs into the lungs every 6 (six) hours as needed for wheezing or shortness of breath. 09/22/16  Yes Ghimire, Henreitta Leber, MD  amLODipine (NORVASC) 5 MG tablet Take 1 tablet (5 mg total) by mouth daily. 01/13/17  Yes Hawks, Christy A, FNP  aspirin 81 MG EC tablet Take 81 mg by mouth daily.     Yes [provider]  atorvastatin (LIPITOR)  20 MG tablet Take 1 tablet (20 mg total) by mouth daily. 01/13/17  Yes Hawks, Christy A, FNP  busPIRone (BUSPAR) 7.5 MG tablet Take 1 tablet (7.5 mg total) by mouth 3 (three) times daily. 04/13/17  Yes Hawks, Christy A, FNP  Cholecalciferol (VITAMIN D3) 2000 UNITS TABS Take 2,000 Units by mouth daily.    Yes [provider]  furosemide (LASIX) 20 MG tablet Take 1 tablet (20 mg total) by mouth daily. 05/17/15  Yes Hongalgi, Lenis Dickinson, MD  HYDROcodone-acetaminophen (NORCO) 7.5-325 MG tablet Take 1-2 tablets by mouth every 8 (eight) hours as needed for moderate pain. 04/13/17  Yes Hawks, Christy A, FNP  levothyroxine (SYNTHROID, LEVOTHROID) 50 MCG tablet Take 1 tablet (50 mcg total) by mouth daily. 01/12/17  Yes Hawks, Christy A, FNP  sertraline (ZOLOFT) 100 MG tablet TAKE 1 TABLET (100 MG TOTAL) BY MOUTH DAILY. 03/26/17  Yes Hawks, Christy A, FNP  atorvastatin (LIPITOR) 10 MG tablet TAKE 1 TABLET (10 MG TOTAL) BY MOUTH DAILY. 04/29/17   [name], FNP  blood glucose meter kit and supplies KIT Dispense per insurance preference. Use up to four times daily . E 11.9 09/30/16   Evelina Dun A, FNP  glucose blood (GLUCOSE METER TEST) test strip Use BID 09/29/16   Evelina Dun A, FNP  HYDROcodone-acetaminophen (NORCO) 7.5-325 MG tablet Take 1 tablet by mouth every 8 (eight) hours as needed for moderate pain. Do not fill until 60 days from prescription date Patient not taking: Reported on 04/28/2017 04/13/17   [name], FNP  HYDROcodone-acetaminophen (NORCO) 7.5-325 MG tablet Take 1-2 tablets by mouth every 8 (eight) hours as needed for moderate pain. Patient not taking: Reported on 04/28/2017 04/13/17   [name], FNP  metFORMIN (GLUCOPHAGE) 500 MG tablet Take 1 tablet (500 mg total) by mouth 2 (two) times daily with a meal. Patient not taking: Reported on 04/13/2017 09/26/16   Evelina Dun A, FNP  quinapril (ACCUPRIL) 40 MG tablet TAKE 1 TABLET (40 MG TOTAL) BY MOUTH DAILY. 04/29/17   Evelina Dun A, FNP  sertraline (ZOLOFT) 50 MG tablet Take 3 tablets (150 mg total) by mouth daily. Patient not taking: Reported on 04/28/2017 11/03/16   [name], FNP    Inpatient Medications    Scheduled Meds: . amLODipine  5 mg Oral Daily  . aspirin EC  81 mg Oral Daily  . atorvastatin  20 mg Oral Daily  . busPIRone  7.5 mg Oral TID  . enoxaparin (LOVENOX) injection  40 mg Subcutaneous Q24H  . furosemide  20 mg Oral Daily  . insulin aspart  0-9 Units Subcutaneous TID WC  . levothyroxine  50 mcg Oral QAC breakfast  . lisinopril  40 mg Oral Daily  . sertraline  100 mg Oral Daily   Continuous Infusions:  PRN Meds: acetaminophen, albuterol, gi cocktail, HYDROcodone-acetaminophen, ondansetron (ZOFRAN) IV  Family History    Family History  Problem Relation Age of Onset  . Lung disease Mother   . Heart attack Father   .  Lung disease Unknown   . Heart attack Unknown   . Cancer Daughter        breast    Social History    Social History   Social History  . Marital status: Widowed    Spouse name: N/A  . Number of children: 4  . Years of education: N/A   Occupational History  . RETIRED    Social History Main Topics  . Smoking status: Never Smoker  . Smokeless tobacco: Never Used  . Alcohol use No  . Drug use: No  . Sexual activity: No   Other Topics Concern  . Not on file   Social History Narrative  . No narrative on file     Review of Systems    General:  No chills, fever, night sweats or weight changes.  Cardiovascular:  No edema, orthopnea, palpitations, paroxysmal nocturnal dyspnea. Positive for chest pain and dyspnea on exertion.  Dermatological: No rash, lesions/masses Respiratory: No cough, dyspnea Urologic: No hematuria, dysuria Abdominal:   No nausea, vomiting, diarrhea, bright red blood per rectum, melena, or hematemesis Neurologic:  No visual changes, wkns, changes in mental status. All other systems reviewed and are otherwise negative except  as noted above.  Physical Exam/Data    Blood pressure 125/65, pulse 75, temperature 97.9 F (36.6 C), temperature source Oral, resp. rate 20, height _0  (1.676 m), weight 250 lb (113.4 kg), SpO2 97 %.  General: Pleasant, obese Caucasian female appearing in NAD Psych: Normal affect. Neuro: Alert and oriented X 3. Moves all extremities spontaneously. HEENT: Normal  Neck: Supple without bruits or JVD. Lungs:  Resp regular and unlabored, CTA without wheezing or rales. Heart: RRR no s3 or s4, 2/6 SEM along RUSB. Tender to palpation along left pectoral region.  Abdomen: Soft, non-tender, non-distended, BS + x 4.  Extremities: No clubbing, cyanosis or edema. DP/PT/Radials 2+ and equal bilaterally.   EKG:  The EKG was personally reviewed and demonstrates:  None Available for review in Epic or shadow chart.  Telemetry:  Telemetry was personally reviewed and demonstrates: NSR, HR in 60's - 80's. No ectopic events.   Labs/Studies     Relevant CV Studies:  NST: 04/2015 FINDINGS: Perfusion: No decreased activity in the left ventricle on stress imaging to suggest reversible ischemia or infarction.  Wall Motion: Normal left ventricular wall motion. No left ventricular dilation.  Left Ventricular Ejection Fraction: 67 %  End diastolic volume 97 ml  End systolic volume 32 ml  IMPRESSION: 1. No reversible ischemia or infarction.  2. Normal left ventricular wall motion.  3. Left ventricular ejection fraction 67%  4. Low-Leblanc stress test findings*.  Echocardiogram: 09/20/2016 Study Conclusions  - Left ventricle: The cavity size was normal. Wall thickness was   increased in a pattern of mild LVH. Systolic function was normal.   The estimated ejection fraction was in the range of 55% to 60%.   Wall motion was normal; there were no regional wall motion   abnormalities. Doppler parameters are consistent with abnormal   left ventricular relaxation (grade 1 diastolic  dysfunction). - Aortic valve: There was mild stenosis. Valve area (VTI): 1.37   cm^2. Valve area (Vmax): 1.24 cm^2. Valve area (Vmean): 1.31   cm^2. - Mitral valve: Calcified annulus. - Left atrium: The atrium was mildly dilated.  Impressions:  - Normal LV systolic function; mild LVH; grade 1 diastolic   dysfunction; calcified aortic valve with mild AS; mild LAE.  Laboratory Data:  Chemistry  Recent Labs  Lab 04/28/17 1025  NA 137  K 4.3  CL 100*  CO2 28  GLUCOSE 155*  BUN 14  CREATININE 0.77  CALCIUM 9.5  GFRNONAA >60  GFRAA >60  ANIONGAP 9     Recent Labs Lab 04/28/17 1210  PROT 6.5  ALBUMIN 3.5  AST 21  ALT 20  ALKPHOS 82  BILITOT 0.4   Hematology  Recent Labs Lab 04/28/17 1025  WBC 9.4  RBC 4.72  HGB 14.0  HCT 42.1  MCV 89.2  MCH 29.7  MCHC 33.3  RDW 12.7  PLT 241   Cardiac Enzymes  Recent Labs Lab 04/28/17 1210 04/28/17 1911 04/29/17 0057 04/29/17 0729  TROPONINI <0.03 <0.03 <0.03 <0.03     Recent Labs Lab 04/28/17 1038  TROPIPOC 0.01    BNP  Recent Labs Lab 04/28/17 1210  BNP 38.6    DDimer   Recent Labs Lab 04/28/17 1210  DDIMER 0.78*    Radiology/Studies:  Dg Chest 2 View  Result Date: 04/28/2017 CLINICAL DATA:  71 year old female status post fall in shower 2 weeks ago. Left side pain, but new onset mid chest pain for the last 2-3 days. EXAM: CHEST  2 VIEW COMPARISON:  10/03/2016 and earlier. FINDINGS: Prior cervical ACDF and thoracic spinal stimulator placement. Mildly lower lung volumes. Stable tortuous thoracic aorta contour. Other mediastinal contours are within normal limits. Visualized tracheal air column is within normal limits. No pneumothorax, pulmonary edema, pleural effusion or confluent pulmonary opacity abdominal Calcified aortic atherosclerosis. Stable cholecystectomy clips. Negative visible bowel gas pattern. Stable visualized osseous structures. IMPRESSION: 1. Lower lung volumes.  No acute  cardiopulmonary abnormality. 2.  Aortic Atherosclerosis (ICD10-I70.0). 3. Stable visualized osseous structures. Prior cervical ACDF and thoracic spinal stimulator placement. Electronically Signed   By: Genevie Ann M.D.   On: 04/28/2017 10:22   Ct Head Wo Contrast  Result Date: 04/28/2017 CLINICAL DATA:  Recent fall with headaches, initial encounter EXAM: CT HEAD WITHOUT CONTRAST TECHNIQUE: Contiguous axial images were obtained from the base of the skull through the vertex without intravenous contrast. COMPARISON:  09/13/2015 FINDINGS: Brain: Mild atrophic changes and chronic white matter ischemic changes are again noted. No findings to suggest acute hemorrhage, acute infarction or space-occupying mass lesion are noted. Vascular: No hyperdense vessel or unexpected calcification. Skull: Normal. Negative for fracture or focal lesion. Sinuses/Orbits: No acute finding. Other: None. IMPRESSION: Chronic changes without acute abnormality. Electronically Signed   By: Inez Catalina M.D.   On: 04/28/2017 11:55   Nm Pulmonary Perf And Vent  Result Date: 04/28/2017 CLINICAL DATA:  Shortness of breath with chest pain and dizziness. Elevated D-dimer. EXAM: NUCLEAR MEDICINE VENTILATION - PERFUSION LUNG SCAN TECHNIQUE: Ventilation images were obtained in multiple projections using inhaled aerosol Tc-67mDTPA. Perfusion images were obtained in multiple projections after intravenous injection of Tc-939mAA. RADIOPHARMACEUTICALS:  32.1 mCi Technetium-9922mPA aerosol inhalation and 4.31 mCi Technetium-44m68m IV COMPARISON:  Chest x-ray today.  Previous V/Q scan 01/18/2014 FINDINGS: Ventilation: No focal ventilation defect. Perfusion: No wedge shaped peripheral perfusion defects to suggest acute pulmonary embolism. IMPRESSION: Normal VQ scan without evidence of pulmonary embolism. Electronically Signed   By: DaniMarin Olp.   On: 04/28/2017 21:28   Dg Shoulder Left  Result Date: 04/28/2017 CLINICAL DATA:  Recent falls. EXAM:  LEFT SHOULDER - 2+ VIEW COMPARISON:  Prior chest x-rays from February 2018 FINDINGS: Remote healed humeral head and neck fractures. No definite acute fracture. The [facility] is [facility]. The visualized left lung is  clear. No definite acute left rib fractures. IMPRESSION: Remote healed left humeral head and neck fractures. Electronically Signed   By: Marijo Sanes M.D.   On: 04/28/2017 11:43   Dg Humerus Left  Result Date: 04/28/2017 CLINICAL DATA:  [place] 2 months ago and again last week. Left shoulder pain. EXAM: LEFT HUMERUS - 2+ VIEW COMPARISON:  Prior chest x-rays from February 2018 FINDINGS: Remote healed left humeral head and neck fractures. No acute fracture of the humerus is identified. The elbow joint is grossly maintained. IMPRESSION: Remote healed left humeral head and neck fractures. No acute humeral fracture. Electronically Signed   By: Marijo Sanes M.D.   On: 04/28/2017 11:41     Assessment & Plan    1. Atypical Chest Pain/ Known CAD - the patient reports developing a shooting pain along her epigastric region yesterday morning which occurred while sitting in her chair and resolved with Hydrocodone. Has baseline dyspnea on exertion and has qualified for home O2 in the past but her daughter reports she does not use this.  - D-dimer elevated at 0.78 but VQ Scan was without evidence of PE. BNP 38. Cyclic troponin values have been negative. No EKG is scanned into the system at this time.  - will obtain a repeat EKG this AM. Check ambulatory oxygen saturations. With her atypical symptoms and negative enzymes, would recommend a Lexiscan Myoview for further ischemic evaluation. This can be performed as an outpatient as she underwent a VQ Scan yesterday which would delay further nuclear imaging for 48 hours. Will likely be a 2-day study secondary to the patient's weight. Will cancel the order for her echocardiogram as one was just performed earlier this year. Dr. Sallyanne Kuster to see the patient later  today.   2. CAD - nonobstructive CAD with 40% Prox LAD stenosis by cath in 2007. NST in 2016 was low-Leblanc and recent echo in 08/2016 showed a preserved EF of 55-60%. - plan for repeat ischemic evaluation with a NST as above.  - continue ASA and statin therapy.   3. HTN - BP remains well-controlled. Continue current medication regimen.   4. HLD - Lipid Panel in 12/2016 showed total cholesterol of 213, HDL 60, and LDL 124. Lipitor increased to 69m daily at that time.  - followed by PCP. Goal LDL is < 70 with known CAD.  5. OSA - noncompliant with CPAP. Compliance strongly encouraged.   6. PAF - maintaining NSR this admission.  - she has declined anticoagulation in the past and with her recent falls and gait instability, not ideal at this time. She is aware of the increased Leblanc of a CVA.    Signed, BErma Heritage PA-C 04/29/2017, 10:00 AM Pager: [phone]  I have seen and examined the patient along with BErma Heritage PA-C.  I have reviewed the chart, notes and new data.  I agree with PA's note.  Key new complaints: symptoms would be very atypical for CAD etiology Key examination changes: early peaking aortic ejection murmur, otherwise normal CV exam (exam limited by habitus) Key new findings / data: repeat ECG without ischemic changes  PLAN: Recommend outpatient Lexiscan Myoview (unable to use treadmill due to poor gait stability/falls). The radioactivity from V/Q precludes performing the study before the end of the week (preferably next week). It will need to be a 2-day study due to body habitus. Will make the necessary arrangements.  MSanda Klein MD, [phone]/09/2016, 12:40 PM \

## 2017-04-29 NOTE — Progress Notes (Signed)
Pt have no HH needs at present time. CM will sign off.

## 2017-04-29 NOTE — Progress Notes (Signed)
SATURATION QUALIFICATIONS: (This note is used to comply with regulatory documentation for home oxygen)  Patient Saturations on Room Air at Rest = 97  Patient Saturations on Room Air while Ambulating = 85  Patient Saturations on 2 Liters of oxygen while Ambulating = 94  Please briefly explain why patient needs home oxygen:  Pt got dyspneic during ambulation, improved after oxygen applied.

## 2017-04-29 NOTE — Discharge Instructions (Signed)
Nonspecific Chest Pain Chest pain can be caused by many different conditions. There is a chance that your pain could be related to something serious, such as a heart attack or a blood clot in your lungs. Chest pain can also be caused by conditions that are not life-threatening. If you have chest pain, it is very important to follow up with your doctor. Follow these instructions at home: Medicines  If you were prescribed an antibiotic medicine, take it as told by your doctor. Do not stop taking the antibiotic even if you start to feel better.  Take over-the-counter and prescription medicines only as told by your doctor. Lifestyle  Do not use any products that contain nicotine or tobacco, such as cigarettes and e-cigarettes. If you need help quitting, ask your doctor.  Do not drink alcohol.  Make lifestyle changes as told by your doctor. These may include: ? Getting regular exercise. Ask your doctor for some activities that are safe for you. ? Eating a heart-healthy diet. A diet specialist (dietitian) can help you to learn healthy eating options. ? Staying at a healthy weight. ? Managing diabetes, if needed. ? Lowering your stress, as with deep breathing or spending time in nature. General instructions  Avoid any activities that make you feel chest pain.  If your chest pain is because of heartburn: ? Raise (elevate) the head of your bed about 6 inches (15 cm). You can do this by putting blocks under the bed legs at the head of the bed. ? Do not sleep with extra pillows under your head. That does not help heartburn.  Keep all follow-up visits as told by your doctor. This is important. This includes any further testing if your chest pain does not go away. Contact a doctor if:  Your chest pain does not go away.  You have a rash with blisters on your chest.  You have a fever.  You have chills. Get help right away if:  Your chest pain is worse.  You have a cough that gets worse, or  you cough up blood.  You have very bad (severe) pain in your belly (abdomen).  You are very weak.  You pass out (faint).  You have either of these for no clear reason: ? Sudden chest discomfort. ? Sudden discomfort in your arms, back, neck, or jaw.  You have shortness of breath at any time.  You suddenly start to sweat, or your skin gets clammy.  You feel sick to your stomach (nauseous).  You throw up (vomit).  You suddenly feel light-headed or dizzy.  Your heart starts to beat fast, or it feels like it is skipping beats. These symptoms may be an emergency. Do not wait to see if the symptoms will go away. Get medical help right away. Call your local emergency services (911 in the [place] Do not drive yourself to the hospital. This information is not intended to replace advice given to you by your health care provider. Make sure you discuss any questions you have with your health care provider. Document Released: 12/31/2007 Document Revised: 04/07/2016 Document Reviewed: 04/07/2016 Elsevier Interactive Patient Education  2017 [place] have a Stress Test scheduled at [facility]. Your doctor has ordered this test to check the blood flow in your heart arteries.  Please arrive 15 minutes early for paperwork. The whole test will take several hours. You may want to bring reading material to remain  occupied while undergoing different parts of the test.  Instructions:  No food/drink after midnight the night before.  It is OK to take your morning meds with a sip of water EXCEPT for those types of medicines listed below or otherwise instructed.  No caffeine/decaf products 24 hours before, including medicines such as Excedrin or Goody Powders. Call if there are any questions.   Wear comfortable clothes and shoes.   Special Medication Instructions:  Beta blockers such as metoprolol (Lopressor/Toprol XL), atenolol (Tenormin), carvedilol  (Coreg), nebivolol (Bystolic), bisoprolol (Zebeta), propranolol (Inderal) should not be taken for 24 hours before the test.  Calcium channel blockers such as diltiazem (Cardizem) or verapmil (Calan) should not be taken for 24 hours before the test.  Remove nitroglycerin patches and do not take nitrate preparations such as Imdur/isosorbide the day of your test.  No Persantine/Theophylline or Aggrenox medicines should be used within 24 hours of the test.   If you are diabetic, please ask which medications to hold the day of the test.  What To Expect: When you arrive in the lab, the technician will inject a small amount of radioactive tracer into your arm through an IV while you are resting quietly. This helps [place] to form pictures of your heart. You will likely only feel a sting from the IV. After a waiting period, resting pictures will be obtained under a big camera. These are the "before" pictures.  Next, you will be prepped for the stress portion of the test. This may include either walking on a treadmill or receiving a medicine that helps to dilate blood vessels in your heart to simulate the effect of exercise on your heart. If you are walking on a treadmill, you will walk at different paces to try to get your heart rate to a goal number that is based on your age. If your doctor has chosen the pharmacologic test, then you will receive a medicine through your IV that may cause temporary nausea, flushing, shortness of breath and sometimes chest discomfort or vomiting. This is typically short-lived and usually resolves quickly. If you experience symptoms, that does not automatically mean the test is abnormal. Some patients do not experience any symptoms at all. Your blood pressure and heart rate will be monitored, and we will be watching your EKG on a computer screen for any changes. During this portion of the test, the radiologist will inject another small amount of radioactive tracer into your IV. After  a waiting period, you will undergo a second set of pictures. These are the "after" pictures.  The doctor reading the test will compare the before-and-after images to look for evidence of heart blockages or heart weakness. The test usually takes 1 day to complete, but in certain instances (for example, if a patient is over a certain weight limit), the test may be done over the span of 2 days.

## 2017-04-30 ENCOUNTER — Telehealth (HOSPITAL_COMMUNITY): Payer: Self-pay

## 2017-04-30 NOTE — Telephone Encounter (Signed)
Encounter complete. 

## 2017-05-02 ENCOUNTER — Other Ambulatory Visit: Payer: Self-pay | Admitting: Family

## 2017-05-05 ENCOUNTER — Ambulatory Visit (HOSPITAL_COMMUNITY)
Admit: 2017-05-05 | Discharge: 2017-05-05 | Disposition: A | Discharge status: Home / Self Care | Payer: Medicare Other | Source: Ambulatory Visit | Attending: Cardiovascular Disease | Admitting: Cardiovascular Disease

## 2017-05-05 DIAGNOSIS — R0609 Other forms of dyspnea: Secondary | ICD-10-CM | POA: Insufficient documentation

## 2017-05-05 DIAGNOSIS — G4733 Obstructive sleep apnea (adult) (pediatric): Secondary | ICD-10-CM | POA: Insufficient documentation

## 2017-05-05 DIAGNOSIS — R9439 Abnormal result of other cardiovascular function study: Secondary | ICD-10-CM | POA: Diagnosis not present

## 2017-05-05 DIAGNOSIS — I480 Paroxysmal atrial fibrillation: Secondary | ICD-10-CM | POA: Diagnosis not present

## 2017-05-05 DIAGNOSIS — R0789 Other chest pain: Secondary | ICD-10-CM | POA: Diagnosis not present

## 2017-05-05 DIAGNOSIS — I10 Essential (primary) hypertension: Secondary | ICD-10-CM | POA: Insufficient documentation

## 2017-05-05 DIAGNOSIS — R5383 Other fatigue: Secondary | ICD-10-CM | POA: Insufficient documentation

## 2017-05-05 DIAGNOSIS — J45909 Unspecified asthma, uncomplicated: Secondary | ICD-10-CM | POA: Insufficient documentation

## 2017-05-05 DIAGNOSIS — Z6841 Body Mass Index (BMI) 40.0 and over, adult: Secondary | ICD-10-CM | POA: Insufficient documentation

## 2017-05-05 DIAGNOSIS — E669 Obesity, unspecified: Secondary | ICD-10-CM | POA: Insufficient documentation

## 2017-05-05 DIAGNOSIS — I25119 Atherosclerotic heart disease of native coronary artery with unspecified angina pectoris: Secondary | ICD-10-CM | POA: Diagnosis not present

## 2017-05-05 DIAGNOSIS — E039 Hypothyroidism, unspecified: Secondary | ICD-10-CM | POA: Diagnosis not present

## 2017-05-05 DIAGNOSIS — R55 Syncope and collapse: Secondary | ICD-10-CM | POA: Insufficient documentation

## 2017-05-05 DIAGNOSIS — F329 Major depressive disorder, single episode, unspecified: Secondary | ICD-10-CM | POA: Diagnosis not present

## 2017-05-05 MED ORDER — REGADENOSON 0.4 MG/5ML IV SOLN
0.4000 mg | Freq: Once | INTRAVENOUS | Status: AC
  Administered 2017-05-05: 0.4 mg via INTRAVENOUS

## 2017-05-05 MED ORDER — TECHNETIUM TC 99M TETROFOSMIN IV KIT
30.5000 | PACK | Freq: Once | INTRAVENOUS | Status: AC | PRN
  Administered 2017-05-05: 30.5 via INTRAVENOUS
  Filled 2017-05-05: qty 31

## 2017-05-06 ENCOUNTER — Ambulatory Visit (HOSPITAL_COMMUNITY)
Admission: RE | Admit: 2017-05-06 | Discharge: 2017-05-06 | Disposition: A | Discharge status: Home / Self Care | Payer: Medicare Other | Source: Ambulatory Visit | Attending: Internal Medicine | Admitting: Internal Medicine

## 2017-05-06 LAB — MYOCARDIAL PERFUSION IMAGING
CHL CUP NUCLEAR SDS: 6
CHL CUP NUCLEAR SRS: 10
LV sys vol: 28 mL
LVDIAVOL: 75 mL (ref 46–106)
NUC STRESS TID: 0.81
Peak HR: 110 {beats}/min
Rest HR: 85 {beats}/min
SSS: 16

## 2017-05-06 MED ORDER — TECHNETIUM TC 99M TETROFOSMIN IV KIT
29.6000 | PACK | Freq: Once | INTRAVENOUS | Status: AC | PRN
  Administered 2017-05-06: 29.6 via INTRAVENOUS

## 2017-05-15 ENCOUNTER — Encounter: Payer: Self-pay | Admitting: Family

## 2017-05-15 ENCOUNTER — Ambulatory Visit (INDEPENDENT_AMBULATORY_CARE_PROVIDER_SITE_OTHER): Payer: Medicare Other | Admitting: Family

## 2017-05-15 VITALS — BP 112/84 | HR 82 | Temp 97.9°F | Ht 66.0 in | Wt 254.4 lb

## 2017-05-15 DIAGNOSIS — R0609 Other forms of dyspnea: Secondary | ICD-10-CM

## 2017-05-15 DIAGNOSIS — I10 Essential (primary) hypertension: Secondary | ICD-10-CM | POA: Diagnosis not present

## 2017-05-15 DIAGNOSIS — R7981 Abnormal blood-gas level: Secondary | ICD-10-CM

## 2017-05-15 DIAGNOSIS — Z9181 History of falling: Secondary | ICD-10-CM | POA: Diagnosis not present

## 2017-05-15 DIAGNOSIS — R0600 Dyspnea, unspecified: Secondary | ICD-10-CM

## 2017-05-15 DIAGNOSIS — Z23 Encounter for immunization: Secondary | ICD-10-CM | POA: Diagnosis not present

## 2017-05-15 DIAGNOSIS — Z09 Encounter for follow-up examination after completed treatment for conditions other than malignant neoplasm: Secondary | ICD-10-CM

## 2017-05-15 LAB — CMP14+EGFR
A/G RATIO: 1.8 (ref 1.2–2.2)
ALT: 13 IU/L (ref 0–32)
AST: 15 IU/L (ref 0–40)
Albumin: 4.2 g/dL (ref 3.5–4.8)
Alkaline Phosphatase: 96 IU/L (ref 39–117)
BILIRUBIN TOTAL: 0.3 mg/dL (ref 0.0–1.2)
BUN/Creatinine Ratio: 19 (ref 12–28)
BUN: 15 mg/dL (ref 8–27)
CHLORIDE: 97 mmol/L (ref 96–106)
CO2: 28 mmol/L (ref 20–29)
Calcium: 9.9 mg/dL (ref 8.7–10.3)
Creatinine, Ser: 0.8 mg/dL (ref 0.57–1.00)
GFR, EST AFRICAN AMERICAN: 86 mL/min/{1.73_m2} (ref >59)
GFR, EST NON AFRICAN AMERICAN: 74 mL/min/{1.73_m2} (ref >59)
GLOBULIN, TOTAL: 2.3 g/dL (ref 1.5–4.5)
GLUCOSE: 120 mg/dL — AB (ref 65–99)
POTASSIUM: 4.6 mmol/L (ref 3.5–5.2)
SODIUM: 140 mmol/L (ref 134–144)
Total Protein: 6.5 g/dL (ref 6.0–8.5)

## 2017-05-15 LAB — CBC WITH DIFFERENTIAL/PLATELET
BASOS: 0 %
Basophils Absolute: 0 10*3/uL (ref 0.0–0.2)
EOS (ABSOLUTE): 0.2 10*3/uL (ref 0.0–0.4)
EOS: 2 %
HEMATOCRIT: 42.3 % (ref 34.0–46.6)
HEMOGLOBIN: 13.7 g/dL (ref 11.1–15.9)
IMMATURE GRANS (ABS): 0 10*3/uL (ref 0.0–0.1)
IMMATURE GRANULOCYTES: 0 %
Lymphocytes Absolute: 2.8 10*3/uL (ref 0.7–3.1)
Lymphs: 28 %
MCH: 29.6 pg (ref 26.6–33.0)
MCHC: 32.4 g/dL (ref 31.5–35.7)
MCV: 91 fL (ref 79–97)
MONOCYTES: 7 %
Monocytes Absolute: 0.7 10*3/uL (ref 0.1–0.9)
NEUTROS ABS: 6.2 10*3/uL (ref 1.4–7.0)
NEUTROS PCT: 63 %
PLATELETS: 256 10*3/uL (ref 150–379)
RBC: 4.63 x10E6/uL (ref 3.77–5.28)
RDW: 13.4 % (ref 12.3–15.4)
WBC: 9.9 10*3/uL (ref 3.4–10.8)

## 2017-05-15 MED ORDER — ALBUTEROL SULFATE HFA 108 (90 BASE) MCG/ACT IN AERS: 2.0000 | INHALATION_SPRAY | Freq: Four times a day (QID) | RESPIRATORY_TRACT | 0 refills | Status: DC | PRN

## 2017-05-15 MED ORDER — ALBUTEROL SULFATE HFA 108 (90 BASE) MCG/ACT IN AERS: 2.0000 | INHALATION_SPRAY | Freq: Four times a day (QID) | RESPIRATORY_TRACT | 2 refills | Status: DC | PRN

## 2017-05-15 NOTE — Patient Instructions (Signed)
Fall Prevention in the Home Falls can cause injuries and can affect people from all age groups. There are many simple things that you can do to make your home safe and to help prevent falls. What can I do on the outside of my home?  Regularly repair the edges of walkways and driveways and fix any cracks.  Remove high doorway thresholds.  Trim any shrubbery on the main path into your home.  Use bright outdoor lighting.  Clear walkways of debris and clutter, including tools and rocks.  Regularly check that handrails are securely fastened and in good repair. Both sides of any steps should have handrails.  Install guardrails along the edges of any raised decks or porches.  Have leaves, snow, and ice cleared regularly.  Use sand or salt on walkways during winter months.  In the garage, clean up any spills right away, including grease or oil spills. What can I do in the bathroom?  Use night lights.  Install grab bars by the toilet and in the tub and shower. Do not use towel bars as grab bars.  Use non-skid mats or decals on the floor of the tub or shower.  If you need to sit down while you are in the shower, use a plastic, non-slip stool.  Keep the floor dry. Immediately clean up any water that spills on the floor.  Remove soap buildup in the tub or shower on a regular basis.  Attach bath mats securely with double-sided non-slip rug tape.  Remove throw rugs and other tripping hazards from the floor. What can I do in the bedroom?  Use night lights.  Make sure that a bedside light is easy to reach.  Do not use oversized bedding that drapes onto the floor.  Have a firm chair that has side arms to use for getting dressed.  Remove throw rugs and other tripping hazards from the floor. What can I do in the kitchen?  Clean up any spills right away.  Avoid walking on wet floors.  Place frequently used items in easy-to-reach places.  If you need to reach for something above  you, use a sturdy step stool that has a grab bar.  Keep electrical cables out of the way.  Do not use floor polish or wax that makes floors slippery. If you have to use wax, make sure that it is non-skid floor wax.  Remove throw rugs and other tripping hazards from the floor. What can I do in the stairways?  Do not leave any items on the stairs.  Make sure that there are handrails on both sides of the stairs. Fix handrails that are broken or loose. Make sure that handrails are as long as the stairways.  Check any carpeting to make sure that it is firmly attached to the stairs. Fix any carpet that is loose or worn.  Avoid having throw rugs at the top or bottom of stairways, or secure the rugs with carpet tape to prevent them from moving.  Make sure that you have a light switch at the top of the stairs and the bottom of the stairs. If you do not have them, have them installed. What are some other fall prevention tips?  Wear closed-toe shoes that fit well and support your feet. Wear shoes that have rubber soles or low heels.  When you use a stepladder, make sure that it is completely opened and that the sides are firmly locked. Have someone hold the ladder while you are using   it. Do not climb a closed stepladder.  Add color or contrast paint or tape to grab bars and handrails in your home. Place contrasting color strips on the first and last steps.  Use mobility aids as needed, such as canes, walkers, scooters, and crutches.  Turn on lights if it is dark. Replace any light bulbs that burn out.  Set up furniture so that there are clear paths. Keep the furniture in the same spot.  Fix any uneven floor surfaces.  Choose a carpet design that does not hide the edge of steps of a stairway.  Be aware of any and all pets.  Review your medicines with your healthcare provider. Some medicines can cause dizziness or changes in blood pressure, which increase your risk of falling. Talk with  your health care provider about other ways that you can decrease your risk of falls. This may include working with a physical therapist or trainer to improve your strength, balance, and endurance. This information is not intended to replace advice given to you by your health care provider. Make sure you discuss any questions you have with your health care provider. Document Released: 07/04/2002 Document Revised: 12/11/2015 Document Reviewed: 08/18/2014 Elsevier Interactive Patient Education  2017 Elsevier Inc.  

## 2017-05-15 NOTE — Progress Notes (Addendum)
   Subjective:    Patient ID: Shirley Leblanc, female    DOB: Jul 22, 1946, 71 y.o.   MRN: [id number]  HPI Pt presents to the office today for hospital follow up. Pt went to ED on 04/28/17 for chest pain and was discharged on 04/29/17.  Pt was negative PE, cardiac enzymes were negative.   Pt was discharge home on 2L of O2. Pt requesting portable oxygen tank. Pt's O2 while walking dropped to 77% on room air.    Pt has followed up with Cardiologists and had stress test that was negative.   [facility] notes reviewed.   Review of Systems  Respiratory: Positive for shortness of breath.   Cardiovascular: Negative for chest pain and leg swelling.  All other systems reviewed and are negative.      Objective:   Physical Exam  Constitutional: She is oriented to person, place, and time. She appears well-developed and well-nourished. No distress.  Morbid obese  HENT:  Head: Normocephalic and atraumatic.  Right Ear: External ear normal.  Left Ear: External ear normal.  Nose: Nose normal.  Mouth/Throat: Oropharynx is clear and moist.  Eyes: Pupils are equal, round, and reactive to light.  Neck: Normal range of motion. Neck supple. No thyromegaly present.  Cardiovascular: Normal rate, regular rhythm, normal heart sounds and intact distal pulses.   No murmur heard. Pulmonary/Chest: Effort normal and breath sounds normal. No respiratory distress. She has no wheezes.  Abdominal: Soft. Bowel sounds are normal. She exhibits no distension. There is no tenderness.  Musculoskeletal: She exhibits no edema or tenderness.  Pain in lower lumbar with flexion or extension, using roller walker  Neurological: She is alert and oriented to person, place, and time.  Skin: Skin is warm and dry.  Psychiatric: She has a normal mood and affect. Her behavior is normal. Judgment and thought content normal.  Vitals reviewed.     BP 112/84   Pulse 82   Temp 97.9 F (36.6 C) (Oral)   Ht '5\' 6"'$  (1.676 m)   Wt 254  lb 6.4 oz (115.4 kg)   BMI 41.06 kg/m      Assessment & Plan:  1. Low oxygen saturation - For home use only DME oxygen - Face-to-face encounter (required for Medicare/Medicaid patients) - Face-to-face encounter (required for Medicare/Medicaid patients) - For home use only DME oxygen - CMP14+EGFR - CBC with Differential/Platelet  2. Essential hypertension - CMP14+EGFR - CBC with Differential/Platelet  3. Morbid obesity ([place]) - CMP14+EGFR - CBC with Differential/Platelet  4. Dyspnea on exertion - CMP14+EGFR - CBC with Differential/Platelet  5. Hospital discharge follow-up - CMP14+EGFR - CBC with Differential/Platelet  6. At moderate risk for fall Discussed importance of removing rugs Only take Norco as needed   Continue all meds Labs pending Health Maintenance reviewed Diet and exercise encouraged RTO as needed for chronic follow up  Evelina Dun, FNP

## 2017-05-15 NOTE — Addendum Note (Signed)
Addended by: Evelina Dun A on: 05/15/2017 11:03 AM   Modules accepted: Orders

## 2017-05-19 ENCOUNTER — Encounter: Payer: Self-pay | Admitting: Cardiology

## 2017-05-22 ENCOUNTER — Other Ambulatory Visit: Payer: Self-pay

## 2017-05-22 DIAGNOSIS — F411 Generalized anxiety disorder: Secondary | ICD-10-CM

## 2017-05-22 MED ORDER — BUSPIRONE HCL 7.5 MG PO TABS: 7.5000 mg | ORAL_TABLET | Freq: Three times a day (TID) | ORAL | 0 refills | Status: DC

## 2017-05-26 ENCOUNTER — Other Ambulatory Visit: Payer: Self-pay | Admitting: *Deleted

## 2017-06-02 ENCOUNTER — Telehealth: Payer: Self-pay | Admitting: Family

## 2017-06-02 MED ORDER — HYDROXYZINE HCL 50 MG PO TABS: 50.0000 mg | ORAL_TABLET | Freq: Three times a day (TID) | ORAL | 1 refills | Status: DC | PRN

## 2017-06-02 NOTE — Telephone Encounter (Signed)
Buspar changed to Vistaril 50 mg TID prn. May cause drowsiness.

## 2017-06-02 NOTE — Telephone Encounter (Signed)
Buspar is on back order for multiple pharmacies including [facility]

## 2017-06-02 NOTE — Telephone Encounter (Signed)
Pt's contact notified of RX change Verbalizes understanding

## 2017-06-02 NOTE — Addendum Note (Signed)
Addended by: Evelina Dun A on: 06/02/2017 02:30 PM   Modules accepted: Orders

## 2017-06-02 NOTE — Telephone Encounter (Signed)
Does another pharmacy have this medication? This has been working well for the patient and do not want to change medication. I can send to another pharmacy for her until they get rx in.

## 2017-06-04 ENCOUNTER — Ambulatory Visit: Payer: Medicare Other | Admitting: Cardiology

## 2017-06-08 ENCOUNTER — Telehealth: Payer: Self-pay | Admitting: Family

## 2017-06-08 NOTE — Telephone Encounter (Signed)
Informed that Vistaril can cause drowsiness and should be taken as needed. Verbalizes understanding

## 2017-06-23 ENCOUNTER — Other Ambulatory Visit: Payer: Self-pay | Admitting: Family

## 2017-06-23 ENCOUNTER — Telehealth: Payer: Self-pay | Admitting: Family

## 2017-06-23 MED ORDER — SERTRALINE HCL 100 MG PO TABS: 100.0000 mg | ORAL_TABLET | Freq: Every day | ORAL | 1 refills | Status: DC

## 2017-06-23 NOTE — Progress Notes (Signed)
Zoloft reordered

## 2017-06-24 ENCOUNTER — Other Ambulatory Visit: Payer: Self-pay | Admitting: *Deleted

## 2017-06-24 NOTE — Telephone Encounter (Signed)
Pt received her oxygen from [facility] when she left the hospital

## 2017-06-29 NOTE — Telephone Encounter (Signed)
Left detailed message on VM that pt needs another appt with [facility]. It must be documented that pt has tried and failed other alternatives before the oxygen. Also that she has all 3 oxygen readings documented.

## 2017-06-29 NOTE — Progress Notes (Deleted)
Cardiology Office Note   Date:  06/29/2017   ID:  Shirley Leblanc, DOB 1946/07/18, MRN [id number]  PCP:  [name], FNP  Cardiologist:   Minus Breeding, MD   No chief complaint on file.     History of Present Illness: Shirley Leblanc is a 71 y.o. female who presents for follow-up of paroxysmal atrial fibrillation and an elevated troponin during hospitalization earlier this year.  She was in the hospital in Feb with CAP.  During that admission for respiratory failure she did have an elevated troponin.  She was in the hospital gain in the hospital in October with chest pain.  She had an out patient stress perfusion study which was negative for evidence of ischemia.  She returns for follow up.  ***   She thinks her breathing is back to baseline. She gets around with a walker. She is limited by some joint problems. She's not having any acute shortness of breath, PND or orthopnea. She denies any chest pressure, neck or arm discomfort. She's not noticing any palpitations, presyncope or syncope. She has lost 20 pounds and she was in the hospital.  Past Medical History:  Diagnosis Date  . Bursitis   . CAD (coronary artery disease)    a. nonobstructive CAD with 40% Prox LAD stenosis by cath in 2007  . Depression   . DJD (degenerative joint disease)   . GERD (gastroesophageal reflux disease)   . Hyperlipemia   . Hypertension   . Left wrist fracture   . Obesities, morbid ([place])   . Open left ankle fracture   . Urinary incontinence     Past Surgical History:  Procedure Laterality Date  . CARDIAC CATHETERIZATION    . CHOLECYSTECTOMY N/A 05/14/2015   Procedure: LAPAROSCOPIC CHOLECYSTECTOMY;  Surgeon: Coralie Keens, MD;  Location: [facility];  Service: General;  Laterality: N/A;  . NECK SURGERY    . TOTAL KNEE ARTHROPLASTY     x2  . VAGINAL HYSTERECTOMY       Current Outpatient Medications  Medication Sig Dispense Refill  . albuterol (PROVENTIL HFA;VENTOLIN HFA) 108 (90 Base)  MCG/ACT inhaler Inhale 2 puffs into the lungs every 6 (six) hours as needed for wheezing or shortness of breath. 1 Inhaler 2  . aspirin 81 MG EC tablet Take 81 mg by mouth daily.      Marland Kitchen atorvastatin (LIPITOR) 20 MG tablet Take 1 tablet (20 mg total) by mouth daily. 90 tablet 3  . blood glucose meter kit and supplies KIT Dispense per insurance preference. Use up to four times daily . E 11.9 1 each 0  . Cholecalciferol (VITAMIN D3) 2000 UNITS TABS Take 2,000 Units by mouth daily.     . furosemide (LASIX) 20 MG tablet Take 1 tablet (20 mg total) by mouth daily. 30 tablet 0  . glucose blood (GLUCOSE METER TEST) test strip Use BID 100 each 12  . HYDROcodone-acetaminophen (NORCO) 7.5-325 MG tablet Take 1-2 tablets by mouth every 8 (eight) hours as needed for moderate pain. 110 tablet 0  . hydrOXYzine (ATARAX/VISTARIL) 50 MG tablet Take 1 tablet (50 mg total) 3 (three) times daily as needed by mouth. 90 tablet 1  . levothyroxine (SYNTHROID, LEVOTHROID) 50 MCG tablet TAKE 1 TABLET (50 MCG TOTAL) BY MOUTH DAILY. 90 tablet 2  . quinapril (ACCUPRIL) 40 MG tablet TAKE 1 TABLET (40 MG TOTAL) BY MOUTH DAILY. 90 tablet 1  . sertraline (ZOLOFT) 100 MG tablet Take 1 tablet (100 mg total)  by mouth daily. 90 tablet 1   No current facility-administered medications for this visit.     Allergies:   Contrast media [iodinated diagnostic agents]; Penicillins; Sulfa antibiotics; and Morphine and related    ROS:  Please see the history of present illness.   Otherwise, review of systems are positive for ***.   All other systems are reviewed and negative.    PHYSICAL EXAM: VS:  There were no vitals taken for this visit. , BMI There is no height or weight on file to calculate BMI.  GENERAL:  Well appearing NECK:  No jugular venous distention, waveform within normal limits, carotid upstroke brisk and symmetric, no bruits, no thyromegaly LUNGS:  Clear to auscultation bilaterally CHEST:  Unremarkable HEART:  PMI not  displaced or sustained,S1 and S2 within normal limits, no S3, no S4, no clicks, no rubs, *** murmurs ABD:  Flat, positive bowel sounds normal in frequency in pitch, no bruits, no rebound, no guarding, no midline pulsatile mass, no hepatomegaly, no splenomegaly EXT:  2 plus pulses throughout, no edema, no cyanosis no clubbing    GENERAL:  Well appearing and in no distress NECK:  No jugular venous distention, waveform within normal limits, carotid upstroke brisk and symmetric, no bruits, no thyromegaly LUNGS:  Clear to auscultation bilaterally BACK:  No CVA tenderness CHEST:  Unremarkable HEART:  PMI not displaced or sustained,S1 and S2 within normal limits, no S3, no S4, no clicks, no rubs, no murmurs ABD:  Flat, positive bowel sounds normal in frequency in pitch, no bruits, no rebound, no guarding, no midline pulsatile mass, no hepatomegaly, no splenomegaly, morbidly obese EXT:  2 plus pulses throughout, no edema, no cyanosis no clubbing   EKG:  EKG is not ***ordered today. ***  Recent Labs: 01/12/2017: TSH 2.930 04/28/2017: B Natriuretic Peptide 38.6 05/15/2017: ALT 13; BUN 15; Creatinine, Ser 0.80; Hemoglobin 13.7; Platelets 256; Potassium 4.6; Sodium 140    Lipid Panel    Component Value Date/Time   CHOL 213 (H) 01/12/2017 1154   TRIG 144 01/12/2017 1154   HDL 60 01/12/2017 1154   CHOLHDL 3.6 01/12/2017 1154   CHOLHDL 4.9 11/18/2010 0655   VLDL 33 11/18/2010 0655   LDLCALC 124 (H) 01/12/2017 1154      Wt Readings from Last 3 Encounters:  05/15/17 254 lb 6.4 oz (115.4 kg)  05/05/17 250 lb (113.4 kg)  04/28/17 250 lb (113.4 kg)      Other studies Reviewed: Additional studies/ records that were reviewed today include: *** Review of the above records demonstrates:  See above and below.   ASSESSMENT AND PLAN:  Elevated troponin:  ***  She hs had no chest pain since her [place] which was negative in 2016.  She had a normal echo at the time of the CAP this year.   I reviewed the hospital records and the trop was mildly elevated and flat.  I don't think that further testing is indicated.  She will let me know if she has any symptoms going forwarrd.  PAF:   *** She has had no symptomatic recurrence of this even in the hospital with flu and CAP.  CHADS2VASC is 3 .   She does not like taking anticoagulation. She understands the risk of stroke if she should be having a symptomatic paroxysms. At this point no change in therapy is planned  Hypertension:   ***   Her blood pressure is well controlled.  No change in therapy is planed  Obesity:  ***  She has lost 20 lbs and I encourage more of the same.   Current medicines are reviewed at length with the patient today.  The patient does not have concerns regarding medicines.  The following changes have been made:  ***  Labs/ tests ordered today include:  *** No orders of the defined types were placed in this encounter.    Disposition:   FU with me ***.   Signed, Minus Breeding, MD  06/29/2017 10:03 PM    [place]

## 2017-06-30 ENCOUNTER — Encounter: Payer: Self-pay | Admitting: Family

## 2017-07-01 ENCOUNTER — Ambulatory Visit: Payer: Medicare Other | Admitting: Cardiology

## 2017-07-14 ENCOUNTER — Encounter: Payer: Self-pay | Admitting: Family

## 2017-07-14 ENCOUNTER — Ambulatory Visit (INDEPENDENT_AMBULATORY_CARE_PROVIDER_SITE_OTHER): Payer: Medicare Other | Admitting: Family

## 2017-07-14 VITALS — BP 142/98 | HR 89 | Temp 98.6°F | Ht 66.0 in | Wt 259.2 lb

## 2017-07-14 DIAGNOSIS — F1120 Opioid dependence, uncomplicated: Secondary | ICD-10-CM

## 2017-07-14 DIAGNOSIS — R296 Repeated falls: Secondary | ICD-10-CM

## 2017-07-14 DIAGNOSIS — R6889 Other general symptoms and signs: Secondary | ICD-10-CM | POA: Diagnosis not present

## 2017-07-14 DIAGNOSIS — Z9181 History of falling: Secondary | ICD-10-CM

## 2017-07-14 DIAGNOSIS — E039 Hypothyroidism, unspecified: Secondary | ICD-10-CM | POA: Diagnosis not present

## 2017-07-14 DIAGNOSIS — E1165 Type 2 diabetes mellitus with hyperglycemia: Secondary | ICD-10-CM

## 2017-07-14 DIAGNOSIS — E1169 Type 2 diabetes mellitus with other specified complication: Secondary | ICD-10-CM

## 2017-07-14 DIAGNOSIS — I152 Hypertension secondary to endocrine disorders: Secondary | ICD-10-CM

## 2017-07-14 DIAGNOSIS — Z0289 Encounter for other administrative examinations: Secondary | ICD-10-CM

## 2017-07-14 DIAGNOSIS — E1159 Type 2 diabetes mellitus with other circulatory complications: Secondary | ICD-10-CM

## 2017-07-14 DIAGNOSIS — M545 Low back pain, unspecified: Secondary | ICD-10-CM

## 2017-07-14 DIAGNOSIS — E785 Hyperlipidemia, unspecified: Secondary | ICD-10-CM

## 2017-07-14 DIAGNOSIS — F332 Major depressive disorder, recurrent severe without psychotic features: Secondary | ICD-10-CM | POA: Diagnosis not present

## 2017-07-14 DIAGNOSIS — I10 Essential (primary) hypertension: Secondary | ICD-10-CM

## 2017-07-14 DIAGNOSIS — M159 Polyosteoarthritis, unspecified: Secondary | ICD-10-CM

## 2017-07-14 DIAGNOSIS — G8929 Other chronic pain: Secondary | ICD-10-CM

## 2017-07-14 DIAGNOSIS — I25119 Atherosclerotic heart disease of native coronary artery with unspecified angina pectoris: Secondary | ICD-10-CM

## 2017-07-14 DIAGNOSIS — I480 Paroxysmal atrial fibrillation: Secondary | ICD-10-CM

## 2017-07-14 LAB — BAYER DCA HB A1C WAIVED: HB A1C: 5.5 % (ref <7.0)

## 2017-07-14 MED ORDER — HYDROCODONE-ACETAMINOPHEN 7.5-325 MG PO TABS: 1.0000 | ORAL_TABLET | Freq: Three times a day (TID) | ORAL | 0 refills | Status: DC | PRN

## 2017-07-14 NOTE — Progress Notes (Signed)
Subjective:    Patient ID: Shirley Leblanc, female    DOB: [birth year], 71 y.o.   MRN: [id number]  PT presents to the office today for chronic follow up and pain medication refill. Pt is followed by Cardiologists annually of A Fib and HTN states this is stable.   Granddaughter states patient has fallen three times over the last three months. Pt states she feels weak and has fallen when she has tried to "step over" things. Pt states she has done Physical Therapy "several" times.  Hypertension  This is a chronic problem. The current episode started more than 1 year ago. The problem has been waxing and waning since onset. The problem is uncontrolled. Associated symptoms include malaise/fatigue. Pertinent negatives include no headaches, peripheral edema or shortness of breath. Risk factors for coronary artery disease include diabetes mellitus, dyslipidemia, post-menopausal state, obesity, family history and sedentary lifestyle. There is no history of kidney disease, CAD/MI, CVA or heart failure. Identifiable causes of hypertension include a thyroid problem.  Diabetes  She presents for her follow-up diabetic visit. She has type 2 diabetes mellitus. Pertinent negatives for hypoglycemia include no headaches or mood changes. Associated symptoms include fatigue. There are no hypoglycemic complications. Pertinent negatives for hypoglycemia complications include no hospitalization. Symptoms are stable. Diabetic complications include peripheral neuropathy. Pertinent negatives for diabetic complications include no CVA, heart disease or nephropathy. Risk factors for coronary artery disease include dyslipidemia, diabetes mellitus and hypertension. Her breakfast blood glucose range is generally 110-130 mg/dl. Eye exam is not current.  Hyperlipidemia  This is a chronic problem. The current episode started more than 1 year ago. The problem is uncontrolled. Recent lipid tests were reviewed and are high. Exacerbating diseases  include obesity. Pertinent negatives include no shortness of breath. Current antihyperlipidemic treatment includes statins. The current treatment provides moderate improvement of lipids. Risk factors for coronary artery disease include dyslipidemia, diabetes mellitus, female sex, hypertension, a sedentary lifestyle and family history.  Thyroid Problem  Presents for follow-up visit. Symptoms include fatigue. Patient reports no constipation, diarrhea or dry skin. The symptoms have been stable. Her past medical history is significant for hyperlipidemia. There is no history of heart failure.  Back Pain  This is a chronic problem. The current episode started more than 1 year ago. The problem is unchanged. The pain is present in the lumbar spine. The quality of the pain is described as aching. The pain is at a severity of 10/10. The pain is moderate. Pertinent negatives include no bladder incontinence, bowel incontinence or headaches. She has tried bed rest and analgesics for the symptoms. The treatment provided mild relief.      Review of Systems  Constitutional: Positive for fatigue and malaise/fatigue.  Respiratory: Negative for shortness of breath.   Gastrointestinal: Negative for bowel incontinence, constipation and diarrhea.  Genitourinary: Negative for bladder incontinence.  Musculoskeletal: Positive for back pain.  Neurological: Negative for headaches.  All other systems reviewed and are negative.      Objective:   Physical Exam  Constitutional: She is oriented to person, place, and time. She appears well-developed and well-nourished. No distress.  HENT:  Head: Normocephalic and atraumatic.  Right Ear: External ear normal.  Left Ear: External ear normal.  Nose: Nose normal.  Mouth/Throat: Oropharynx is clear and moist.  Eyes: Pupils are equal, round, and reactive to light.  Neck: Normal range of motion. Neck supple. No thyromegaly present.  Cardiovascular: Normal rate, regular rhythm,  normal heart sounds and intact distal pulses.  No murmur heard. Pulmonary/Chest: Effort normal and breath sounds normal. No respiratory distress. She has no wheezes.  Abdominal: Soft. Bowel sounds are normal. She exhibits no distension. There is no tenderness.  Musculoskeletal: She exhibits tenderness. She exhibits no edema.  Generalized weakness, lower back with flexion or extension   Neurological: She is alert and oriented to person, place, and time.  Skin: Skin is warm and dry.  Psychiatric: She has a normal mood and affect. Her behavior is normal. Judgment and thought content normal.  Vitals reviewed.     BP (!) 142/98   Pulse 89   Temp 98.6 F (37 C) (Oral)   Ht 5' 6"  (1.676 m)   Wt 259 lb 3.2 oz (117.6 kg)   SpO2 95% Comment: Sitting with room air.  BMI 41.84 kg/m      Assessment & Plan:  1. Hypertension associated with diabetes ([place]) - CMP14+EGFR  2. Paroxysmal atrial fibrillation (H[facility]) - CMP14+EGFR  3. Hyperlipidemia associated with type 2 diabetes mellitus (H[facility]) - CMP14+EGFR - Lipid panel  4. Hypothyroidism, unspecified type  - CMP14+EGFR - TSH  5. Type 2 diabetes mellitus with hyperglycemia, without long-term current use of insulin (H[facility]) - Bayer DCA Hb A1c Waived - Ambulatory referral to Ophthalmology - CMP14+EGFR  6. Chronic right-sided low back pain without sciatica  - Ambulatory referral to Neurology - HYDROcodone-acetaminophen ([place]) 7.5-325 MG tablet; Take 1-2 tablets by mouth every 8 (eight) hours as needed for moderate pain.  Dispense: 110 tablet; Refill: 0 - CMP14+EGFR  7. Morbid obesity ([place])  - Ambulatory referral to Neurology - CMP14+EGFR  8. Uncomplicated opioid dependence ([place])  - HYDROcodone-acetaminophen (NORCO) 7.5-325 MG tablet; Take 1-2 tablets by mouth every 8 (eight) hours as needed for moderate pain.  Dispense: 110 tablet; Refill: 0 - CMP14+EGFR  9. Pain medication agreement signed  - HYDROcodone-acetaminophen (NORCO)  7.5-325 MG tablet; Take 1-2 tablets by mouth every 8 (eight) hours as needed for moderate pain.  Dispense: 110 tablet; Refill: 0 - CMP14+EGFR  10. Severe episode of recurrent major depressive disorder, without psychotic features ([place]) - CMP14+EGFR  11. Frequent falls  - Anemia Profile B - Ambulatory referral to Neurology - CMP14+EGFR  12. At moderate risk for fall  - Ambulatory referral to Neurology - CMP14+EGFR  13. Osteoarthritis of multiple joints, unspecified osteoarthritis type - HYDROcodone-acetaminophen (NORCO) 7.5-325 MG tablet; Take 1-2 tablets by mouth every 8 (eight) hours as needed for moderate pain.  Dispense: 110 tablet; Refill: 0 - CMP14+EGFR  PT reviewed in [place] controlled database. Pt has not received other controlled medications except from me  Continue all meds Labs pending Health Maintenance reviewed Diet and exercise encouraged RTO 3 months   Evelina Dun, FNP

## 2017-07-14 NOTE — Patient Instructions (Signed)

## 2017-07-15 LAB — CMP14+EGFR
A/G RATIO: 1.7 (ref 1.2–2.2)
ALK PHOS: 108 IU/L (ref 39–117)
ALT: 10 IU/L (ref 0–32)
AST: 13 IU/L (ref 0–40)
Albumin: 4.3 g/dL (ref 3.5–4.8)
BILIRUBIN TOTAL: 0.4 mg/dL (ref 0.0–1.2)
BUN/Creatinine Ratio: 18 (ref 12–28)
BUN: 14 mg/dL (ref 8–27)
CHLORIDE: 101 mmol/L (ref 96–106)
CO2: 24 mmol/L (ref 20–29)
Calcium: 10 mg/dL (ref 8.7–10.3)
Creatinine, Ser: 0.76 mg/dL (ref 0.57–1.00)
GFR calc non Af Amer: 79 mL/min/{1.73_m2} (ref >59)
GFR, EST AFRICAN AMERICAN: 91 mL/min/{1.73_m2} (ref >59)
Globulin, Total: 2.6 g/dL (ref 1.5–4.5)
Glucose: 113 mg/dL — ABNORMAL HIGH (ref 65–99)
POTASSIUM: 4.5 mmol/L (ref 3.5–5.2)
Sodium: 143 mmol/L (ref 134–144)
TOTAL PROTEIN: 6.9 g/dL (ref 6.0–8.5)

## 2017-07-15 LAB — TSH: TSH: 3.56 u[IU]/mL (ref 0.450–4.500)

## 2017-07-15 LAB — ANEMIA PROFILE B
BASOS ABS: 0.1 10*3/uL (ref 0.0–0.2)
Basos: 1 %
EOS (ABSOLUTE): 0.3 10*3/uL (ref 0.0–0.4)
Eos: 3 %
Ferritin: 160 ng/mL — ABNORMAL HIGH (ref 15–150)
Folate: 6 ng/mL (ref >3.0)
Hematocrit: 44.1 % (ref 34.0–46.6)
Hemoglobin: 14.6 g/dL (ref 11.1–15.9)
Immature Grans (Abs): 0 10*3/uL (ref 0.0–0.1)
Immature Granulocytes: 0 %
Iron Saturation: 40 % (ref 15–55)
Iron: 113 ug/dL (ref 27–139)
Lymphocytes Absolute: 3.2 10*3/uL — ABNORMAL HIGH (ref 0.7–3.1)
Lymphs: 31 %
MCH: 30.4 pg (ref 26.6–33.0)
MCHC: 33.1 g/dL (ref 31.5–35.7)
MCV: 92 fL (ref 79–97)
MONOCYTES: 6 %
MONOS ABS: 0.6 10*3/uL (ref 0.1–0.9)
NEUTROS ABS: 6.2 10*3/uL (ref 1.4–7.0)
Neutrophils: 59 %
PLATELETS: 241 10*3/uL (ref 150–379)
RBC: 4.81 x10E6/uL (ref 3.77–5.28)
RDW: 13.2 % (ref 12.3–15.4)
RETIC CT PCT: 2.1 % (ref 0.6–2.6)
Total Iron Binding Capacity: 281 ug/dL (ref 250–450)
UIBC: 168 ug/dL (ref 118–369)
VITAMIN B 12: 292 pg/mL (ref 232–1245)
WBC: 10.4 10*3/uL (ref 3.4–10.8)

## 2017-07-15 LAB — LIPID PANEL
Chol/HDL Ratio: 3.6 ratio (ref 0.0–4.4)
Cholesterol, Total: 205 mg/dL — ABNORMAL HIGH (ref 100–199)
HDL: 57 mg/dL (ref >39)
LDL Calculated: 111 mg/dL — ABNORMAL HIGH (ref 0–99)
Triglycerides: 187 mg/dL — ABNORMAL HIGH (ref 0–149)
VLDL Cholesterol Cal: 37 mg/dL (ref 5–40)

## 2017-07-23 DIAGNOSIS — H52223 Regular astigmatism, bilateral: Secondary | ICD-10-CM | POA: Diagnosis not present

## 2017-07-23 DIAGNOSIS — H35033 Hypertensive retinopathy, bilateral: Secondary | ICD-10-CM | POA: Diagnosis not present

## 2017-08-07 ENCOUNTER — Other Ambulatory Visit: Payer: Self-pay | Admitting: Family

## 2017-08-07 MED ORDER — HYDROXYZINE HCL 50 MG PO TABS: 50.0000 mg | ORAL_TABLET | Freq: Three times a day (TID) | ORAL | 0 refills | Status: DC | PRN

## 2017-08-08 ENCOUNTER — Telehealth: Payer: Self-pay

## 2017-08-22 ENCOUNTER — Emergency Department (HOSPITAL_COMMUNITY): Payer: Medicare Other

## 2017-08-22 ENCOUNTER — Encounter (HOSPITAL_COMMUNITY): Payer: Self-pay | Admitting: *Deleted

## 2017-08-22 ENCOUNTER — Other Ambulatory Visit: Payer: Self-pay

## 2017-08-22 ENCOUNTER — Emergency Department (HOSPITAL_COMMUNITY)
Admission: EM | Admit: 2017-08-22 | Discharge: 2017-08-22 | Disposition: A | Discharge status: Home / Self Care | Payer: Medicare Other | Attending: Emergency Medicine | Admitting: Emergency Medicine

## 2017-08-22 DIAGNOSIS — Z79899 Other long term (current) drug therapy: Secondary | ICD-10-CM | POA: Diagnosis not present

## 2017-08-22 DIAGNOSIS — R4020 Unspecified coma: Secondary | ICD-10-CM | POA: Diagnosis not present

## 2017-08-22 DIAGNOSIS — R410 Disorientation, unspecified: Secondary | ICD-10-CM | POA: Diagnosis not present

## 2017-08-22 DIAGNOSIS — R531 Weakness: Secondary | ICD-10-CM | POA: Diagnosis not present

## 2017-08-22 DIAGNOSIS — I2510 Atherosclerotic heart disease of native coronary artery without angina pectoris: Secondary | ICD-10-CM | POA: Diagnosis not present

## 2017-08-22 DIAGNOSIS — R4182 Altered mental status, unspecified: Secondary | ICD-10-CM | POA: Insufficient documentation

## 2017-08-22 DIAGNOSIS — Z7982 Long term (current) use of aspirin: Secondary | ICD-10-CM | POA: Insufficient documentation

## 2017-08-22 DIAGNOSIS — N390 Urinary tract infection, site not specified: Secondary | ICD-10-CM

## 2017-08-22 DIAGNOSIS — J9811 Atelectasis: Secondary | ICD-10-CM | POA: Diagnosis not present

## 2017-08-22 DIAGNOSIS — I119 Hypertensive heart disease without heart failure: Secondary | ICD-10-CM | POA: Diagnosis not present

## 2017-08-22 DIAGNOSIS — E119 Type 2 diabetes mellitus without complications: Secondary | ICD-10-CM | POA: Insufficient documentation

## 2017-08-22 DIAGNOSIS — R32 Unspecified urinary incontinence: Secondary | ICD-10-CM | POA: Diagnosis not present

## 2017-08-22 DIAGNOSIS — F4489 Other dissociative and conversion disorders: Secondary | ICD-10-CM | POA: Diagnosis not present

## 2017-08-22 DIAGNOSIS — R404 Transient alteration of awareness: Secondary | ICD-10-CM | POA: Diagnosis not present

## 2017-08-22 LAB — URINALYSIS, ROUTINE W REFLEX MICROSCOPIC
BACTERIA UA: NONE SEEN
Bilirubin Urine: NEGATIVE
GLUCOSE, UA: NEGATIVE mg/dL
Hgb urine dipstick: NEGATIVE
Ketones, ur: NEGATIVE mg/dL
Nitrite: NEGATIVE
PH: 5 (ref 5.0–8.0)
Protein, ur: NEGATIVE mg/dL
Specific Gravity, Urine: 1.021 (ref 1.005–1.030)

## 2017-08-22 LAB — COMPREHENSIVE METABOLIC PANEL
ALBUMIN: 3.9 g/dL (ref 3.5–5.0)
ALK PHOS: 87 U/L (ref 38–126)
ALT: 15 U/L (ref 14–54)
AST: 20 U/L (ref 15–41)
Anion gap: 10 (ref 5–15)
BILIRUBIN TOTAL: 0.4 mg/dL (ref 0.3–1.2)
BUN: 17 mg/dL (ref 6–20)
CALCIUM: 9.7 mg/dL (ref 8.9–10.3)
CO2: 30 mmol/L (ref 22–32)
CREATININE: 0.81 mg/dL (ref 0.44–1.00)
Chloride: 101 mmol/L (ref 101–111)
GFR calc Af Amer: >60 mL/min (ref >60)
GFR calc non Af Amer: >60 mL/min (ref >60)
Glucose, Bld: 105 mg/dL — ABNORMAL HIGH (ref 65–99)
Potassium: 3.8 mmol/L (ref 3.5–5.1)
Sodium: 141 mmol/L (ref 135–145)
TOTAL PROTEIN: 7.6 g/dL (ref 6.5–8.1)

## 2017-08-22 LAB — CBC WITH DIFFERENTIAL/PLATELET
BASOS ABS: 0 10*3/uL (ref 0.0–0.1)
BASOS PCT: 0 %
Eosinophils Absolute: 0.3 10*3/uL (ref 0.0–0.7)
Eosinophils Relative: 3 %
HEMATOCRIT: 45.2 % (ref 36.0–46.0)
HEMOGLOBIN: 14.7 g/dL (ref 12.0–15.0)
LYMPHS ABS: 3.1 10*3/uL (ref 0.7–4.0)
LYMPHS PCT: 31 %
MCH: 30 pg (ref 26.0–34.0)
MCHC: 32.5 g/dL (ref 30.0–36.0)
MCV: 92.2 fL (ref 78.0–100.0)
MONO ABS: 0.7 10*3/uL (ref 0.1–1.0)
Monocytes Relative: 7 %
Neutro Abs: 5.9 10*3/uL (ref 1.7–7.7)
Neutrophils Relative %: 59 %
Platelets: 210 10*3/uL (ref 150–400)
RBC: 4.9 MIL/uL (ref 3.87–5.11)
RDW: 13.3 % (ref 11.5–15.5)
WBC: 10.1 10*3/uL (ref 4.0–10.5)

## 2017-08-22 LAB — CBG MONITORING, ED: Glucose-Capillary: 111 mg/dL — ABNORMAL HIGH (ref 65–99)

## 2017-08-22 LAB — TROPONIN I

## 2017-08-22 MED ORDER — NITROFURANTOIN MACROCRYSTAL 100 MG PO CAPS
100.0000 mg | ORAL_CAPSULE | Freq: Once | ORAL | Status: AC
  Administered 2017-08-22: 100 mg via ORAL
  Filled 2017-08-22: qty 1
  Filled 2017-08-22: qty 2

## 2017-08-22 MED ORDER — SODIUM CHLORIDE 0.9 % IV BOLUS (SEPSIS)
500.0000 mL | Freq: Once | INTRAVENOUS | Status: AC
  Administered 2017-08-22: 500 mL via INTRAVENOUS

## 2017-08-22 MED ORDER — NITROFURANTOIN MONOHYD MACRO 100 MG PO CAPS: 100.0000 mg | ORAL_CAPSULE | Freq: Two times a day (BID) | ORAL | 0 refills | Status: DC

## 2017-08-22 MED ORDER — SODIUM CHLORIDE 0.9 % IV BOLUS (SEPSIS): 1000.0000 mL | Freq: Once | INTRAVENOUS | Status: DC

## 2017-08-22 NOTE — ED Triage Notes (Addendum)
Pt brought in by [facility] for c/o ams; family reports that pt has had some foul smelling urine x 4 days; cbg 120

## 2017-08-22 NOTE — ED Notes (Signed)
Care Manager, please contact Allyson Sabal [phone] regarding patients [place] and PT.

## 2017-08-22 NOTE — ED Provider Notes (Signed)
[facility] EMERGENCY DEPARTMENT Provider Note   CSN: [id number] Arrival date & time: 08/22/17  1900     History   Chief Complaint Chief Complaint  Patient presents with  . Altered Mental Status    HPI Shirley Leblanc is a 72 y.o. female.  The history is provided by the patient and a relative.  Altered Mental Status   This is a new problem. The current episode started more than 2 days ago. The problem has been gradually worsening. Associated symptoms include confusion and weakness.   Per the patient's daughter she has had increasing weakness over the last week or so.  She also has some intermittent confusion saying things that don't make any sense  Also complained of little bit of lower abdominal pain and had darker foul-smelling urine.  Past Medical History:  Diagnosis Date  . Bursitis   . CAD (coronary artery disease)    a. nonobstructive CAD with 40% Prox LAD stenosis by cath in 2007  . Depression   . DJD (degenerative joint disease)   . GERD (gastroesophageal reflux disease)   . Hyperlipemia   . Hypertension   . Left wrist fracture   . Obesities, morbid ([place])   . Open left ankle fracture   . Urinary incontinence     Patient Active Problem List   Diagnosis Date Noted  . At moderate risk for fall 05/15/2017  . Chest pain 04/28/2017  . Osteopenia 02/02/2017  . Morbid obesity ([place]) 11/13/2016  . Elevated troponin 11/13/2016  . Chronic back pain 10/10/2016  . Pain medication agreement signed 10/10/2016  . Opioid dependence ([place]) 10/10/2016  . Diabetes ([place]) 09/19/2016  . Depression 04/18/2016  . Hypothyroid 02/22/2016  . Hyperlipidemia associated with type 2 diabetes mellitus ([place]) 02/22/2016  . Chronic anticoagulation - Eliquis, CHADS2VASC=3 06/05/2015  . Atrial fibrillation ([place]) 05/16/2015  . S/P laparoscopic cholecystectomy   . Hypertension associated with diabetes ([place]) 05/09/2015  . Dyspnea on exertion 03/14/2011    Past Surgical History:  Procedure  Laterality Date  . CARDIAC CATHETERIZATION    . CHOLECYSTECTOMY N/A 05/14/2015   Procedure: LAPAROSCOPIC CHOLECYSTECTOMY;  Surgeon: Coralie Keens, MD;  Location: [place];  Service: General;  Laterality: N/A;  . NECK SURGERY    . TOTAL KNEE ARTHROPLASTY     x2  . VAGINAL HYSTERECTOMY      OB History    No data available       Home Medications    Prior to Admission medications   Medication Sig Start Date End Date Taking? Authorizing Provider  albuterol (PROVENTIL HFA;VENTOLIN HFA) 108 (90 Base) MCG/ACT inhaler TAKE 2 PUFFS BY MOUTH EVERY 6 HOURS AS NEEDED FOR WHEEZE OR SHORTNESS OF BREATH 08/07/17  Yes Hawks, Christy A, FNP  aspirin 81 MG EC tablet Take 81 mg by mouth daily.     Yes [provider]  atorvastatin (LIPITOR) 20 MG tablet Take 1 tablet (20 mg total) by mouth daily. 01/13/17  Yes Hawks, Christy A, FNP  Cholecalciferol (VITAMIN D3) 2000 UNITS TABS Take 2,000 Units by mouth daily.    Yes [provider]  furosemide (LASIX) 20 MG tablet Take 1 tablet (20 mg total) by mouth daily. 05/17/15  Yes Hongalgi, Lenis Dickinson, MD  HYDROcodone-acetaminophen (NORCO) 7.5-325 MG tablet Take 1-2 tablets by mouth every 8 (eight) hours as needed for moderate pain. Patient taking differently: Take 1 tablet by mouth 3 (three) times daily.  07/14/17  Yes Hawks, Alyse Low A, FNP  hydrOXYzine (ATARAX/VISTARIL) 50 MG  tablet Take 1 tablet (50 mg total) by mouth 3 (three) times daily as needed. Patient taking differently: Take 50 mg by mouth 2 (two) times daily.  08/07/17  Yes Hawks, Christy A, FNP  levothyroxine (SYNTHROID, LEVOTHROID) 50 MCG tablet TAKE 1 TABLET (50 MCG TOTAL) BY MOUTH DAILY. 05/04/17  Yes Martin, Mary-Margaret, FNP  quinapril (ACCUPRIL) 40 MG tablet TAKE 1 TABLET (40 MG TOTAL) BY MOUTH DAILY. 04/29/17  Yes Hawks, Christy A, FNP  sertraline (ZOLOFT) 100 MG tablet Take 1 tablet (100 mg total) by mouth daily. 06/23/17 12/20/17 Yes Hawks, Christy A, FNP  blood glucose meter kit  and supplies KIT Dispense per insurance preference. Use up to four times daily . E 11.9 09/30/16   Hawks, Christy A, FNP  glucose blood (GLUCOSE METER TEST) test strip Use BID 09/29/16   Hawks, Alyse Low A, FNP  nitrofurantoin, macrocrystal-monohydrate, (MACROBID) 100 MG capsule Take 1 capsule (100 mg total) by mouth 2 (two) times daily. X 7 days 08/22/17   Xoey Warmoth, Corene Cornea, MD    Family History Family History  Problem Relation Age of Onset  . Lung disease Mother   . Heart attack Father   . Lung disease Unknown   . Heart attack Unknown   . Cancer Daughter        breast    Social History Social History   Tobacco Use  . Smoking status: Never Smoker  . Smokeless tobacco: Never Used  Substance Use Topics  . Alcohol use: No  . Drug use: No     Allergies   Contrast media [iodinated diagnostic agents]; Penicillins; and Sulfa antibiotics   Review of Systems Review of Systems  Genitourinary: Positive for decreased urine volume.  Neurological: Positive for weakness.  Psychiatric/Behavioral: Positive for confusion.  All other systems reviewed and are negative.    Physical Exam Updated Vital Signs BP (!) 145/76 (BP Location: Right Arm)   Pulse 78   Temp 98.6 F (37 C) (Rectal)   Resp 18   Ht _0  (1.676 m)   Wt 117.9 kg (260 lb)   SpO2 94%   BMI 41.97 kg/m   Physical Exam  Constitutional: She is oriented to person, place, and time. She appears well-developed and well-nourished.  HENT:  Head: Normocephalic and atraumatic.  Eyes: Conjunctivae and EOM are normal. Pupils are equal, round, and reactive to light.  Neck: Normal range of motion.  Cardiovascular: Normal rate and regular rhythm.  Pulmonary/Chest: No stridor. No respiratory distress.  Abdominal: Soft. She exhibits no distension.  Musculoskeletal: Normal range of motion. She exhibits no edema or deformity.  Neurological: She is alert and oriented to person, place, and time. No cranial nerve deficit. Coordination  normal.  Skin: Skin is warm and dry.  Nursing note and vitals reviewed.    ED Treatments / Results  Labs (all labs ordered are listed, but only abnormal results are displayed) Labs Reviewed  COMPREHENSIVE METABOLIC PANEL - Abnormal; Notable for the following components:      Result Value   Glucose, Bld 105 (*)    All other components within normal limits  URINALYSIS, ROUTINE W REFLEX MICROSCOPIC - Abnormal; Notable for the following components:   APPearance HAZY (*)    Leukocytes, UA MODERATE (*)    Squamous Epithelial / LPF 0-5 (*)    All other components within normal limits  CBG MONITORING, ED - Abnormal; Notable for the following components:   Glucose-Capillary 111 (*)    All other components within normal limits  URINE CULTURE  CBC WITH DIFFERENTIAL/PLATELET  TROPONIN I    EKG  EKG Interpretation None       Radiology Dg Chest 2 View  Result Date: 08/22/2017 CLINICAL DATA:  Patient with foul-smelling urine. EXAM: CHEST  2 VIEW COMPARISON:  Chest CT 04/29/2017; chest radiograph 04/28/2017 FINDINGS: Monitoring leads overlie the patient. Stable cardiomegaly. No consolidative pulmonary opacities. Bibasilar atelectasis. No pleural effusion or pneumothorax. Anterior cervical spinal fusion hardware. Thoracic spinal stimulator device. Thoracic spine degenerative changes. IMPRESSION: No acute cardiopulmonary process. Cardiomegaly. Basilar atelectasis. Electronically Signed   By: Lovey Newcomer M.D.   On: 08/22/2017 20:24   Ct Head Wo Contrast  Result Date: 08/22/2017 CLINICAL DATA:  Altered level of consciousness. Confusion. Weakness. No reported injury. EXAM: CT HEAD WITHOUT CONTRAST TECHNIQUE: Contiguous axial images were obtained from the base of the skull through the vertex without intravenous contrast. COMPARISON:  04/28/2017 head CT. FINDINGS: Brain: No evidence of parenchymal hemorrhage or extra-axial fluid collection. No mass lesion, mass effect, or midline shift. No CT  evidence of acute infarction. Nonspecific mild subcortical and periventricular white matter hypodensity, most in keeping with chronic small vessel ischemic change. Cerebral volume is age appropriate. No ventriculomegaly. Vascular: No acute abnormality. Skull: No evidence of calvarial fracture. Sinuses/Orbits: The visualized paranasal sinuses are essentially clear. Other:  The mastoid air cells are unopacified. IMPRESSION: 1.  No evidence of acute intracranial abnormality. 2. Mild chronic small vessel ischemic changes in the cerebral white matter. Electronically Signed   By: Ilona Sorrel M.D.   On: 08/22/2017 20:35    Procedures Procedures (including critical care time)  Medications Ordered in ED Medications  nitrofurantoin (MACRODANTIN) capsule 100 mg (not administered)  sodium chloride 0.9 % bolus 500 mL (500 mLs Intravenous New Bag/Given 08/22/17 1943)     Initial Impression / Assessment and Plan / ED Course  I have reviewed the triage vital signs and the nursing notes.  Pertinent labs & imaging results that were available during my care of the patient were reviewed by me and considered in my medical decision making (see chart for details).    Will evaluate for causes of weakness to include infection, metabolic, stroke. Fluids in mean time.  Patient with some improvement in her mental status and sleepiness.  She is still oriented.  She still nontoxic-appearing.  Her urine had white blood cells and esterase.  She is never had that before suspect she does have a urinary tract infection so culture is sent she will be started on nitrofurantoin secondary to penicillin allergy. Will consult case management for pt/ot at home and possibly evaluation for placement if needed? Daughter prefers they call her and her name is in the demographics.  Final Clinical Impressions(s) / ED Diagnoses   Final diagnoses:  Confusion  Lower urinary tract infectious disease    ED Discharge Orders        Ordered      nitrofurantoin, macrocrystal-monohydrate, (MACROBID) 100 MG capsule  2 times daily     08/22/17 2218    Home Health     08/22/17 2222    Face-to-face encounter (required for Medicare/Medicaid patients)    Comments:  I Shantasia Hunnell, Corene Cornea certify that this patient is under my care and that I, or a nurse practitioner or physician's assistant working with me, had a face-to-face encounter that meets the physician face-to-face encounter requirements with this patient on 08/22/2017. The encounter with the patient was in whole, or in part for the following medical condition(s) which is  the primary reason for home health care (List medical condition): worsening weakness, confusion, difficulty with ADL's. Further orders per primary provider.   08/22/17 2222       Shawntay Prest, Corene Cornea, MD 08/22/17 1438

## 2017-08-23 ENCOUNTER — Encounter: Payer: Self-pay | Admitting: Family

## 2017-08-24 ENCOUNTER — Telehealth: Payer: Self-pay | Admitting: *Deleted

## 2017-08-24 LAB — URINE CULTURE

## 2017-08-24 NOTE — Telephone Encounter (Signed)
Pt daughter called regarding home health set up from over the weekend.  EDCM reviewed chart, did not find where pt was currently active with a specific agency.  EDCM contacted pt daughter Olive Bass) who did not have a preference of agency- just someone who could start quickly.  EDCM reached out to several agencies and [facility] was able to start care TODAY.

## 2017-08-26 DIAGNOSIS — I10 Essential (primary) hypertension: Secondary | ICD-10-CM | POA: Diagnosis not present

## 2017-08-26 DIAGNOSIS — R2681 Unsteadiness on feet: Secondary | ICD-10-CM | POA: Diagnosis not present

## 2017-08-26 DIAGNOSIS — M545 Low back pain: Secondary | ICD-10-CM | POA: Diagnosis not present

## 2017-08-26 DIAGNOSIS — I482 Chronic atrial fibrillation: Secondary | ICD-10-CM | POA: Diagnosis not present

## 2017-08-26 DIAGNOSIS — F0390 Unspecified dementia without behavioral disturbance: Secondary | ICD-10-CM | POA: Diagnosis not present

## 2017-08-26 DIAGNOSIS — M6281 Muscle weakness (generalized): Secondary | ICD-10-CM | POA: Diagnosis not present

## 2017-08-27 DIAGNOSIS — M6281 Muscle weakness (generalized): Secondary | ICD-10-CM | POA: Diagnosis not present

## 2017-08-27 DIAGNOSIS — R2681 Unsteadiness on feet: Secondary | ICD-10-CM | POA: Diagnosis not present

## 2017-08-27 DIAGNOSIS — M545 Low back pain: Secondary | ICD-10-CM | POA: Diagnosis not present

## 2017-08-27 DIAGNOSIS — I10 Essential (primary) hypertension: Secondary | ICD-10-CM | POA: Diagnosis not present

## 2017-08-27 DIAGNOSIS — I482 Chronic atrial fibrillation: Secondary | ICD-10-CM | POA: Diagnosis not present

## 2017-08-27 DIAGNOSIS — F0390 Unspecified dementia without behavioral disturbance: Secondary | ICD-10-CM | POA: Diagnosis not present

## 2017-08-28 ENCOUNTER — Telehealth: Payer: Self-pay | Admitting: *Deleted

## 2017-08-28 DIAGNOSIS — M6281 Muscle weakness (generalized): Secondary | ICD-10-CM | POA: Diagnosis not present

## 2017-08-28 DIAGNOSIS — F0390 Unspecified dementia without behavioral disturbance: Secondary | ICD-10-CM | POA: Diagnosis not present

## 2017-08-28 DIAGNOSIS — I482 Chronic atrial fibrillation: Secondary | ICD-10-CM | POA: Diagnosis not present

## 2017-08-28 DIAGNOSIS — M545 Low back pain: Secondary | ICD-10-CM | POA: Diagnosis not present

## 2017-08-28 DIAGNOSIS — I10 Essential (primary) hypertension: Secondary | ICD-10-CM | POA: Diagnosis not present

## 2017-08-28 DIAGNOSIS — R2681 Unsteadiness on feet: Secondary | ICD-10-CM | POA: Diagnosis not present

## 2017-08-28 NOTE — Telephone Encounter (Signed)
Why does pt need a speech evaluation?

## 2017-08-28 NOTE — Telephone Encounter (Signed)
Sandy from [facility] called and needs a verbal ok for them to extend speech evaluation until next week. Is this ok?

## 2017-08-28 NOTE — Telephone Encounter (Signed)
Ok it is fine to post pone this until next week

## 2017-08-28 NOTE — Telephone Encounter (Signed)
Per last email from family she is getting help with multiple things from [place] due to recent decline

## 2017-08-28 NOTE — Telephone Encounter (Signed)
Sandy @ [facility] aware.

## 2017-09-01 ENCOUNTER — Telehealth: Payer: Self-pay | Admitting: *Deleted

## 2017-09-01 ENCOUNTER — Ambulatory Visit (INDEPENDENT_AMBULATORY_CARE_PROVIDER_SITE_OTHER): Payer: Medicare Other

## 2017-09-01 DIAGNOSIS — R2681 Unsteadiness on feet: Secondary | ICD-10-CM

## 2017-09-01 DIAGNOSIS — E119 Type 2 diabetes mellitus without complications: Secondary | ICD-10-CM | POA: Diagnosis not present

## 2017-09-01 DIAGNOSIS — F0390 Unspecified dementia without behavioral disturbance: Secondary | ICD-10-CM | POA: Diagnosis not present

## 2017-09-01 DIAGNOSIS — F1110 Opioid abuse, uncomplicated: Secondary | ICD-10-CM | POA: Diagnosis not present

## 2017-09-01 DIAGNOSIS — I482 Chronic atrial fibrillation: Secondary | ICD-10-CM | POA: Diagnosis not present

## 2017-09-01 DIAGNOSIS — M6281 Muscle weakness (generalized): Secondary | ICD-10-CM | POA: Diagnosis not present

## 2017-09-01 DIAGNOSIS — M545 Low back pain: Secondary | ICD-10-CM

## 2017-09-01 DIAGNOSIS — F329 Major depressive disorder, single episode, unspecified: Secondary | ICD-10-CM

## 2017-09-01 DIAGNOSIS — I10 Essential (primary) hypertension: Secondary | ICD-10-CM | POA: Diagnosis not present

## 2017-09-01 NOTE — Telephone Encounter (Signed)
Vm from [place] w/ [facility] stated that she has sxs of a UTI LMOVM for Melissa for clarification on sxs

## 2017-09-02 ENCOUNTER — Other Ambulatory Visit: Payer: Self-pay | Admitting: *Deleted

## 2017-09-02 DIAGNOSIS — N3090 Cystitis, unspecified without hematuria: Secondary | ICD-10-CM

## 2017-09-02 NOTE — Telephone Encounter (Signed)
If patient can leave a sample urine or if she can come in that would be more beneficial.  If she cannot come in if they can bring a urine sample that would help [place] run a culture and see if she has infection.

## 2017-09-02 NOTE — Telephone Encounter (Signed)
Aware.  Bring specimen.

## 2017-09-03 ENCOUNTER — Other Ambulatory Visit: Payer: Medicare Other

## 2017-09-03 DIAGNOSIS — F0390 Unspecified dementia without behavioral disturbance: Secondary | ICD-10-CM | POA: Diagnosis not present

## 2017-09-03 DIAGNOSIS — R2681 Unsteadiness on feet: Secondary | ICD-10-CM | POA: Diagnosis not present

## 2017-09-03 DIAGNOSIS — I10 Essential (primary) hypertension: Secondary | ICD-10-CM | POA: Diagnosis not present

## 2017-09-03 DIAGNOSIS — M6281 Muscle weakness (generalized): Secondary | ICD-10-CM | POA: Diagnosis not present

## 2017-09-03 DIAGNOSIS — I482 Chronic atrial fibrillation: Secondary | ICD-10-CM | POA: Diagnosis not present

## 2017-09-03 DIAGNOSIS — R300 Dysuria: Secondary | ICD-10-CM | POA: Diagnosis not present

## 2017-09-03 DIAGNOSIS — M545 Low back pain: Secondary | ICD-10-CM | POA: Diagnosis not present

## 2017-09-07 DIAGNOSIS — R2681 Unsteadiness on feet: Secondary | ICD-10-CM | POA: Diagnosis not present

## 2017-09-07 DIAGNOSIS — I10 Essential (primary) hypertension: Secondary | ICD-10-CM | POA: Diagnosis not present

## 2017-09-07 DIAGNOSIS — M545 Low back pain: Secondary | ICD-10-CM | POA: Diagnosis not present

## 2017-09-07 DIAGNOSIS — M6281 Muscle weakness (generalized): Secondary | ICD-10-CM | POA: Diagnosis not present

## 2017-09-07 DIAGNOSIS — I482 Chronic atrial fibrillation: Secondary | ICD-10-CM | POA: Diagnosis not present

## 2017-09-07 DIAGNOSIS — F0390 Unspecified dementia without behavioral disturbance: Secondary | ICD-10-CM | POA: Diagnosis not present

## 2017-09-09 DIAGNOSIS — M545 Low back pain: Secondary | ICD-10-CM | POA: Diagnosis not present

## 2017-09-09 DIAGNOSIS — F0390 Unspecified dementia without behavioral disturbance: Secondary | ICD-10-CM | POA: Diagnosis not present

## 2017-09-09 DIAGNOSIS — I10 Essential (primary) hypertension: Secondary | ICD-10-CM | POA: Diagnosis not present

## 2017-09-09 DIAGNOSIS — M6281 Muscle weakness (generalized): Secondary | ICD-10-CM | POA: Diagnosis not present

## 2017-09-09 DIAGNOSIS — R2681 Unsteadiness on feet: Secondary | ICD-10-CM | POA: Diagnosis not present

## 2017-09-09 DIAGNOSIS — I482 Chronic atrial fibrillation: Secondary | ICD-10-CM | POA: Diagnosis not present

## 2017-09-14 ENCOUNTER — Telehealth: Payer: Self-pay

## 2017-09-14 DIAGNOSIS — M545 Low back pain: Secondary | ICD-10-CM | POA: Diagnosis not present

## 2017-09-14 DIAGNOSIS — I482 Chronic atrial fibrillation: Secondary | ICD-10-CM | POA: Diagnosis not present

## 2017-09-14 DIAGNOSIS — M6281 Muscle weakness (generalized): Secondary | ICD-10-CM | POA: Diagnosis not present

## 2017-09-14 DIAGNOSIS — I10 Essential (primary) hypertension: Secondary | ICD-10-CM | POA: Diagnosis not present

## 2017-09-14 DIAGNOSIS — R2681 Unsteadiness on feet: Secondary | ICD-10-CM | POA: Diagnosis not present

## 2017-09-14 DIAGNOSIS — F0390 Unspecified dementia without behavioral disturbance: Secondary | ICD-10-CM | POA: Diagnosis not present

## 2017-09-14 NOTE — Telephone Encounter (Signed)
[place] last Friday slid of bed  Slight bruising

## 2017-09-15 ENCOUNTER — Ambulatory Visit: Payer: Medicare Other | Admitting: Neurology

## 2017-09-15 ENCOUNTER — Other Ambulatory Visit: Payer: Self-pay

## 2017-09-15 ENCOUNTER — Emergency Department (HOSPITAL_COMMUNITY): Payer: Medicare Other

## 2017-09-15 ENCOUNTER — Telehealth: Payer: Self-pay | Admitting: *Deleted

## 2017-09-15 ENCOUNTER — Inpatient Hospital Stay (HOSPITAL_COMMUNITY)
Admission: EM | Admit: 2017-09-15 | Discharge: 2017-09-19 | DRG: 312 | Disposition: A | Discharge status: Home Health | Payer: Medicare Other | Attending: Internal Medicine | Admitting: Internal Medicine

## 2017-09-15 ENCOUNTER — Observation Stay (HOSPITAL_COMMUNITY): Payer: Medicare Other

## 2017-09-15 ENCOUNTER — Observation Stay (HOSPITAL_COMMUNITY)
Admit: 2017-09-15 | Discharge: 2017-09-15 | Disposition: A | Discharge status: Home / Self Care | Payer: Medicare Other | Attending: Family Medicine | Admitting: Family Medicine

## 2017-09-15 ENCOUNTER — Encounter (HOSPITAL_COMMUNITY): Payer: Self-pay | Admitting: *Deleted

## 2017-09-15 DIAGNOSIS — G9340 Encephalopathy, unspecified: Secondary | ICD-10-CM

## 2017-09-15 DIAGNOSIS — F1120 Opioid dependence, uncomplicated: Secondary | ICD-10-CM | POA: Diagnosis present

## 2017-09-15 DIAGNOSIS — G44319 Acute post-traumatic headache, not intractable: Secondary | ICD-10-CM | POA: Diagnosis present

## 2017-09-15 DIAGNOSIS — R251 Tremor, unspecified: Secondary | ICD-10-CM | POA: Diagnosis present

## 2017-09-15 DIAGNOSIS — Z66 Do not resuscitate: Secondary | ICD-10-CM | POA: Diagnosis present

## 2017-09-15 DIAGNOSIS — M85851 Other specified disorders of bone density and structure, right thigh: Secondary | ICD-10-CM

## 2017-09-15 DIAGNOSIS — I480 Paroxysmal atrial fibrillation: Secondary | ICD-10-CM | POA: Diagnosis not present

## 2017-09-15 DIAGNOSIS — G44309 Post-traumatic headache, unspecified, not intractable: Secondary | ICD-10-CM | POA: Diagnosis present

## 2017-09-15 DIAGNOSIS — E785 Hyperlipidemia, unspecified: Secondary | ICD-10-CM | POA: Diagnosis present

## 2017-09-15 DIAGNOSIS — E1159 Type 2 diabetes mellitus with other circulatory complications: Secondary | ICD-10-CM | POA: Diagnosis present

## 2017-09-15 DIAGNOSIS — F329 Major depressive disorder, single episode, unspecified: Secondary | ICD-10-CM | POA: Diagnosis present

## 2017-09-15 DIAGNOSIS — I6521 Occlusion and stenosis of right carotid artery: Secondary | ICD-10-CM | POA: Diagnosis present

## 2017-09-15 DIAGNOSIS — Z9981 Dependence on supplemental oxygen: Secondary | ICD-10-CM

## 2017-09-15 DIAGNOSIS — K219 Gastro-esophageal reflux disease without esophagitis: Secondary | ICD-10-CM | POA: Diagnosis present

## 2017-09-15 DIAGNOSIS — I6789 Other cerebrovascular disease: Secondary | ICD-10-CM | POA: Diagnosis not present

## 2017-09-15 DIAGNOSIS — Z6841 Body Mass Index (BMI) 40.0 and over, adult: Secondary | ICD-10-CM

## 2017-09-15 DIAGNOSIS — Z9181 History of falling: Secondary | ICD-10-CM | POA: Diagnosis not present

## 2017-09-15 DIAGNOSIS — E039 Hypothyroidism, unspecified: Secondary | ICD-10-CM | POA: Diagnosis present

## 2017-09-15 DIAGNOSIS — G4733 Obstructive sleep apnea (adult) (pediatric): Secondary | ICD-10-CM | POA: Diagnosis present

## 2017-09-15 DIAGNOSIS — E1169 Type 2 diabetes mellitus with other specified complication: Secondary | ICD-10-CM | POA: Diagnosis not present

## 2017-09-15 DIAGNOSIS — J449 Chronic obstructive pulmonary disease, unspecified: Secondary | ICD-10-CM | POA: Diagnosis present

## 2017-09-15 DIAGNOSIS — I10 Essential (primary) hypertension: Secondary | ICD-10-CM

## 2017-09-15 DIAGNOSIS — Z7901 Long term (current) use of anticoagulants: Secondary | ICD-10-CM | POA: Diagnosis not present

## 2017-09-15 DIAGNOSIS — Z96653 Presence of artificial knee joint, bilateral: Secondary | ICD-10-CM | POA: Diagnosis present

## 2017-09-15 DIAGNOSIS — R42 Dizziness and giddiness: Secondary | ICD-10-CM | POA: Diagnosis not present

## 2017-09-15 DIAGNOSIS — M1990 Unspecified osteoarthritis, unspecified site: Secondary | ICD-10-CM | POA: Diagnosis present

## 2017-09-15 DIAGNOSIS — I4891 Unspecified atrial fibrillation: Secondary | ICD-10-CM | POA: Diagnosis present

## 2017-09-15 DIAGNOSIS — R55 Syncope and collapse: Secondary | ICD-10-CM | POA: Diagnosis not present

## 2017-09-15 DIAGNOSIS — I2510 Atherosclerotic heart disease of native coronary artery without angina pectoris: Secondary | ICD-10-CM | POA: Diagnosis present

## 2017-09-15 DIAGNOSIS — G252 Other specified forms of tremor: Secondary | ICD-10-CM | POA: Diagnosis present

## 2017-09-15 DIAGNOSIS — M85852 Other specified disorders of bone density and structure, left thigh: Secondary | ICD-10-CM | POA: Diagnosis not present

## 2017-09-15 DIAGNOSIS — R410 Disorientation, unspecified: Secondary | ICD-10-CM

## 2017-09-15 DIAGNOSIS — R531 Weakness: Secondary | ICD-10-CM | POA: Diagnosis not present

## 2017-09-15 DIAGNOSIS — M8580 Other specified disorders of bone density and structure, unspecified site: Secondary | ICD-10-CM | POA: Diagnosis present

## 2017-09-15 DIAGNOSIS — R0600 Dyspnea, unspecified: Secondary | ICD-10-CM | POA: Diagnosis present

## 2017-09-15 DIAGNOSIS — F32A Depression, unspecified: Secondary | ICD-10-CM | POA: Diagnosis present

## 2017-09-15 DIAGNOSIS — E119 Type 2 diabetes mellitus without complications: Secondary | ICD-10-CM

## 2017-09-15 DIAGNOSIS — I152 Hypertension secondary to endocrine disorders: Secondary | ICD-10-CM | POA: Diagnosis present

## 2017-09-15 DIAGNOSIS — Z91041 Radiographic dye allergy status: Secondary | ICD-10-CM

## 2017-09-15 DIAGNOSIS — Z880 Allergy status to penicillin: Secondary | ICD-10-CM

## 2017-09-15 DIAGNOSIS — R0609 Other forms of dyspnea: Secondary | ICD-10-CM | POA: Diagnosis present

## 2017-09-15 DIAGNOSIS — R269 Unspecified abnormalities of gait and mobility: Secondary | ICD-10-CM | POA: Diagnosis present

## 2017-09-15 DIAGNOSIS — R32 Unspecified urinary incontinence: Secondary | ICD-10-CM | POA: Diagnosis present

## 2017-09-15 DIAGNOSIS — G2581 Restless legs syndrome: Secondary | ICD-10-CM | POA: Diagnosis present

## 2017-09-15 DIAGNOSIS — E1165 Type 2 diabetes mellitus with hyperglycemia: Secondary | ICD-10-CM | POA: Diagnosis not present

## 2017-09-15 DIAGNOSIS — Z882 Allergy status to sulfonamides status: Secondary | ICD-10-CM

## 2017-09-15 LAB — CBC WITH DIFFERENTIAL/PLATELET
BASOS ABS: 0 10*3/uL (ref 0.0–0.1)
BASOS PCT: 0 %
EOS ABS: 0.3 10*3/uL (ref 0.0–0.7)
EOS PCT: 4 %
HCT: 41.1 % (ref 36.0–46.0)
Hemoglobin: 13 g/dL (ref 12.0–15.0)
Lymphocytes Relative: 31 %
Lymphs Abs: 2.3 10*3/uL (ref 0.7–4.0)
MCH: 29.2 pg (ref 26.0–34.0)
MCHC: 31.6 g/dL (ref 30.0–36.0)
MCV: 92.4 fL (ref 78.0–100.0)
Monocytes Absolute: 0.4 10*3/uL (ref 0.1–1.0)
Monocytes Relative: 6 %
Neutro Abs: 4.4 10*3/uL (ref 1.7–7.7)
Neutrophils Relative %: 59 %
PLATELETS: 177 10*3/uL (ref 150–400)
RBC: 4.45 MIL/uL (ref 3.87–5.11)
RDW: 12.8 % (ref 11.5–15.5)
WBC: 7.4 10*3/uL (ref 4.0–10.5)

## 2017-09-15 LAB — COMPREHENSIVE METABOLIC PANEL
ALT: 16 U/L (ref 14–54)
AST: 22 U/L (ref 15–41)
Albumin: 3.7 g/dL (ref 3.5–5.0)
Alkaline Phosphatase: 82 U/L (ref 38–126)
Anion gap: 9 (ref 5–15)
BUN: 13 mg/dL (ref 6–20)
CHLORIDE: 97 mmol/L — AB (ref 101–111)
CO2: 34 mmol/L — AB (ref 22–32)
CREATININE: 0.77 mg/dL (ref 0.44–1.00)
Calcium: 9.6 mg/dL (ref 8.9–10.3)
GFR calc Af Amer: >60 mL/min (ref >60)
GFR calc non Af Amer: >60 mL/min (ref >60)
Glucose, Bld: 111 mg/dL — ABNORMAL HIGH (ref 65–99)
POTASSIUM: 4.2 mmol/L (ref 3.5–5.1)
SODIUM: 140 mmol/L (ref 135–145)
Total Bilirubin: 0.3 mg/dL (ref 0.3–1.2)
Total Protein: 7 g/dL (ref 6.5–8.1)

## 2017-09-15 LAB — TROPONIN I
Troponin I: <0.03 ng/mL (ref <0.03)
Troponin I: <0.03 ng/mL (ref <0.03)

## 2017-09-15 LAB — TSH: TSH: 3.06 u[IU]/mL (ref 0.350–4.500)

## 2017-09-15 LAB — MAGNESIUM: MAGNESIUM: 1.7 mg/dL (ref 1.7–2.4)

## 2017-09-15 LAB — GLUCOSE, CAPILLARY: GLUCOSE-CAPILLARY: 114 mg/dL — AB (ref 65–99)

## 2017-09-15 LAB — CBG MONITORING, ED
GLUCOSE-CAPILLARY: 99 mg/dL (ref 65–99)
Glucose-Capillary: 99 mg/dL (ref 65–99)

## 2017-09-15 LAB — HEMOGLOBIN A1C
HEMOGLOBIN A1C: 5.7 % — AB (ref 4.8–5.6)
MEAN PLASMA GLUCOSE: 116.89 mg/dL

## 2017-09-15 MED ORDER — LEVOTHYROXINE SODIUM 50 MCG PO TABS
50.0000 ug | ORAL_TABLET | Freq: Every day | ORAL | Status: DC
  Administered 2017-09-16 – 2017-09-19 (×4): 50 ug via ORAL
  Filled 2017-09-15 (×4): qty 1

## 2017-09-15 MED ORDER — ASPIRIN 81 MG PO CHEW
81.0000 mg | CHEWABLE_TABLET | Freq: Every day | ORAL | Status: DC
  Administered 2017-09-16 – 2017-09-19 (×4): 81 mg via ORAL
  Filled 2017-09-15 (×4): qty 1

## 2017-09-15 MED ORDER — ALUM & MAG HYDROXIDE-SIMETH 200-200-20 MG/5ML PO SUSP: 30.0000 mL | Freq: Four times a day (QID) | ORAL | Status: DC | PRN

## 2017-09-15 MED ORDER — ACETAMINOPHEN 325 MG PO TABS
650.0000 mg | ORAL_TABLET | Freq: Four times a day (QID) | ORAL | Status: DC | PRN
  Administered 2017-09-17: 650 mg via ORAL
  Filled 2017-09-15: qty 2

## 2017-09-15 MED ORDER — NYSTATIN 100000 UNIT/GM EX POWD
Freq: Three times a day (TID) | CUTANEOUS | Status: DC
  Administered 2017-09-15 – 2017-09-19 (×11): via TOPICAL
  Filled 2017-09-15 (×2): qty 15

## 2017-09-15 MED ORDER — SODIUM CHLORIDE 0.9 % IV SOLN
INTRAVENOUS | Status: AC
  Administered 2017-09-15 – 2017-09-16 (×2): via INTRAVENOUS

## 2017-09-15 MED ORDER — QUINAPRIL HCL 10 MG PO TABS
40.0000 mg | ORAL_TABLET | Freq: Every day | ORAL | Status: DC
  Administered 2017-09-16: 40 mg via ORAL
  Filled 2017-09-15 (×4): qty 4

## 2017-09-15 MED ORDER — SENNOSIDES-DOCUSATE SODIUM 8.6-50 MG PO TABS
1.0000 | ORAL_TABLET | Freq: Every evening | ORAL | Status: DC | PRN
  Filled 2017-09-15: qty 1

## 2017-09-15 MED ORDER — HYDROCODONE-ACETAMINOPHEN 10-325 MG PO TABS
1.0000 | ORAL_TABLET | Freq: Once | ORAL | Status: AC
  Administered 2017-09-15: 1 via ORAL
  Filled 2017-09-15: qty 1

## 2017-09-15 MED ORDER — ATORVASTATIN CALCIUM 20 MG PO TABS
20.0000 mg | ORAL_TABLET | Freq: Every day | ORAL | Status: DC
  Administered 2017-09-16 – 2017-09-19 (×4): 20 mg via ORAL
  Filled 2017-09-15 (×6): qty 1

## 2017-09-15 MED ORDER — INSULIN ASPART 100 UNIT/ML ~~LOC~~ SOLN
0.0000 [IU] | Freq: Three times a day (TID) | SUBCUTANEOUS | Status: DC
  Administered 2017-09-16 – 2017-09-18 (×3): 2 [IU] via SUBCUTANEOUS
  Administered 2017-09-18: 3 [IU] via SUBCUTANEOUS

## 2017-09-15 MED ORDER — SODIUM CHLORIDE 0.9% FLUSH
3.0000 mL | Freq: Two times a day (BID) | INTRAVENOUS | Status: DC
  Administered 2017-09-16 – 2017-09-19 (×5): 3 mL via INTRAVENOUS

## 2017-09-15 MED ORDER — ONDANSETRON HCL 4 MG/2ML IJ SOLN: 4.0000 mg | Freq: Four times a day (QID) | INTRAMUSCULAR | Status: DC | PRN

## 2017-09-15 MED ORDER — HYDROXYZINE HCL 25 MG PO TABS
25.0000 mg | ORAL_TABLET | Freq: Three times a day (TID) | ORAL | Status: DC | PRN
  Administered 2017-09-15 – 2017-09-18 (×2): 25 mg via ORAL
  Filled 2017-09-15 (×2): qty 1

## 2017-09-15 MED ORDER — SERTRALINE HCL 50 MG PO TABS
100.0000 mg | ORAL_TABLET | Freq: Every day | ORAL | Status: DC
  Administered 2017-09-15 – 2017-09-18 (×4): 100 mg via ORAL
  Filled 2017-09-15 (×4): qty 2

## 2017-09-15 MED ORDER — HYDROCODONE-ACETAMINOPHEN 7.5-325 MG PO TABS
1.0000 | ORAL_TABLET | Freq: Four times a day (QID) | ORAL | Status: DC | PRN
  Administered 2017-09-15 – 2017-09-19 (×11): 1 via ORAL
  Filled 2017-09-15 (×11): qty 1

## 2017-09-15 MED ORDER — KETOROLAC TROMETHAMINE 30 MG/ML IJ SOLN: 30.0000 mg | Freq: Four times a day (QID) | INTRAMUSCULAR | Status: AC | PRN

## 2017-09-15 MED ORDER — INSULIN ASPART 100 UNIT/ML ~~LOC~~ SOLN: 0.0000 [IU] | Freq: Every day | SUBCUTANEOUS | Status: DC

## 2017-09-15 MED ORDER — ENOXAPARIN SODIUM 40 MG/0.4ML ~~LOC~~ SOLN
40.0000 mg | SUBCUTANEOUS | Status: DC
  Administered 2017-09-15 – 2017-09-18 (×4): 40 mg via SUBCUTANEOUS
  Filled 2017-09-15 (×4): qty 0.4

## 2017-09-15 MED ORDER — ONDANSETRON HCL 4 MG PO TABS: 4.0000 mg | ORAL_TABLET | Freq: Four times a day (QID) | ORAL | Status: DC | PRN

## 2017-09-15 MED ORDER — ACETAMINOPHEN 650 MG RE SUPP: 650.0000 mg | Freq: Four times a day (QID) | RECTAL | Status: DC | PRN

## 2017-09-15 NOTE — ED Provider Notes (Signed)
Emergency Department Provider Note   I have reviewed the triage vital signs and the nursing notes.   HISTORY  Chief Complaint Dizziness   HPI Shirley Leblanc is a 72 y.o. female with multiple medical problems as diagnosed below the presents to the emergency department today secondary to multiple episodes of syncope and confusion.  Most of the history is given by the patient's daughter at bedside.  Patient hit her head about 8 months ago and since that time she is had multiple episodes of loss of consciousness.  The only last 1-2 minutes and initially they were very rare.  She is getting worked up by her doctor but they become much more frequent and they are associated with approximately 30-60 minutes of confusion afterwards.  No full body shaking or anything like that when it happens.  The problem is the patient is with her daughter and it is hard to keep control of her when these things happen.  No history of seizures.  No history of passing out before this.  She has had a neurology appointment today but another episode happened this morning the daughter cannot hold her up.  She is been get physical therapy for generalized weakness at home does not seem to be improving from that standpoint. No other associated or modifying symptoms.    Past Medical History:  Diagnosis Date  . Bursitis   . CAD (coronary artery disease)    a. nonobstructive CAD with 40% Prox LAD stenosis by cath in 2007  . Depression   . DJD (degenerative joint disease)   . GERD (gastroesophageal reflux disease)   . Hyperlipemia   . Hypertension   . Left wrist fracture   . Obesities, morbid ([place])   . Open left ankle fracture   . Urinary incontinence     Patient Active Problem List   Diagnosis Date Noted  . Syncope 09/15/2017  . CAD (coronary artery disease) 09/15/2017  . At [place] [place] for fall 05/15/2017  . Chest pain 04/28/2017  . Osteopenia 02/02/2017  . Morbid obesity ([place]) 11/13/2016  . Elevated troponin  11/13/2016  . Chronic back pain 10/10/2016  . Pain medication agreement signed 10/10/2016  . Opioid dependence ([place]) 10/10/2016  . Diabetes ([place]) 09/19/2016  . Depression 04/18/2016  . Hypothyroid 02/22/2016  . Hyperlipidemia associated with type 2 diabetes mellitus ([place]) 02/22/2016  . Chronic anticoagulation - Eliquis, CHADS2VASC=3 06/05/2015  . Atrial fibrillation ([place]) 05/16/2015  . S/P laparoscopic cholecystectomy   . Hypertension associated with diabetes ([place]) 05/09/2015  . Dyspnea on exertion 03/14/2011    Past Surgical History:  Procedure Laterality Date  . CARDIAC CATHETERIZATION    . CHOLECYSTECTOMY N/A 05/14/2015   Procedure: LAPAROSCOPIC CHOLECYSTECTOMY;  Surgeon: Coralie Keens, MD;  Location: [facility];  Service: General;  Laterality: N/A;  . NECK SURGERY    . TOTAL KNEE ARTHROPLASTY     x2  . VAGINAL HYSTERECTOMY      Current Outpatient Rx  . Order #: [id number] Class: Normal  . Order #: [id number] Class: Historical Med  . Order #: [id number] Class: Normal  . Order #: [id number] Class: Print  . Order #: [id number] Class: Historical Med  . Order #: [id number] Class: Normal  . Order #: [id number] Class: Normal  . Order #: [id number] Class: Print  . Order #: [id number] Class: Normal  . Order #: [id number] Class: Normal  . Order #: [id number] Class: Print  . Order #: [id number] Class: Normal  . Order #: [id number] Class: Normal    Allergies Contrast media [iodinated diagnostic agents]; Penicillins; and Sulfa  antibiotics  Family History  Problem Relation Age of Onset  . Lung disease Mother   . Heart attack Father   . Lung disease Unknown   . Heart attack Unknown   . Cancer Daughter        breast    Social History Social History   Tobacco Use  . Smoking status: Never Smoker  . Smokeless tobacco: Never Used  Substance Use Topics  . Alcohol use: No  . Drug use: No    Review of Systems  All other systems negative except as documented in the HPI. All pertinent positives and  negatives as reviewed in the HPI. ____________________________________________   PHYSICAL EXAM:  VITAL SIGNS: Blood pressure 128/83, pulse 73, temperature 98.1 F (36.7 C), temperature source Oral, resp. rate (!) 23, height 5\' 6"  (1.676 m), weight 117.9 kg (260 lb), SpO2 98 %.  Constitutional: Alert and oriented. Well appearing and in no acute distress. Eyes: Conjunctivae are normal. PERRL. EOMI. Head: Atraumatic. Nose: No congestion/rhinnorhea. Mouth/Throat: Mucous membranes are moist.  Oropharynx non-erythematous. Neck: No stridor.  No meningeal signs.   Cardiovascular: Normal rate, regular rhythm. Good peripheral circulation. Grossly normal heart sounds.   Respiratory: Normal respiratory effort.  No retractions. Lungs CTAB. Gastrointestinal: Soft and nontender. No distention.  Musculoskeletal: No lower extremity tenderness nor edema. No gross deformities of extremities. Neurologic:  Normal speech and language. Left ptosis but other cranial nerves intact. Slight left arm weakness but not significant. LE's equal strength and sensation. Skin:  Skin is warm, dry and intact. No rash noted.  ____________________________________________   LABS (all labs ordered are listed, but only abnormal results are displayed)  Labs Reviewed  COMPREHENSIVE METABOLIC PANEL - Abnormal; Notable for the following components:      Result Value   Chloride 97 (*)    CO2 34 (*)    Glucose, Bld 111 (*)    All other components within normal limits  CBC WITH DIFFERENTIAL/PLATELET  TROPONIN I  MAGNESIUM  TSH  TROPONIN I  TROPONIN I  HEMOGLOBIN A1C  CBG MONITORING, ED   ____________________________________________  EKG  My ECG Read Indication: Syncope EKG was personally contemporaneously reviewed by myself. Rate: 76 PR Interval: 168 QRS duration: 98 QT/QTC: 420/472 Axis: left axis deviation EKG: unchanged from previous tracings. Other significant findings:  none  ____________________________________________  RADIOLOGY  Dg Chest 2 View  Result Date: 09/15/2017 CLINICAL DATA:  Headache, dizziness and nausea since a fall 8 months ago. EXAM: CHEST  2 VIEW COMPARISON:  CT chest 04/29/2017. PA and lateral chest 08/24/2017 and 04/28/2017. FINDINGS: Mild linear atelectasis is seen in the lingula and right base. No consolidative process, edema, pneumothorax or effusion. Heart size is upper normal. Aortic atherosclerosis is seen. Spinal stimulator is in place. No acute bony abnormality. Remote surgical neck fracture left humerus is noted. IMPRESSION: No acute disease. Atherosclerosis. Electronically Signed   By: Inge Rise M.D.   On: 09/15/2017 10:49   Ct Head Wo Contrast  Result Date: 09/15/2017 CLINICAL DATA:  Headache, dizziness. EXAM: CT HEAD WITHOUT CONTRAST TECHNIQUE: Contiguous axial images were obtained from the base of the skull through the vertex without intravenous contrast. COMPARISON:  CT scan of August 22, 2017. FINDINGS: Brain: Mild chronic ischemic white matter disease is noted. No mass effect or midline shift is noted. Ventricular size is within normal limits. There is no evidence of mass lesion, hemorrhage or acute infarction. Vascular: No hyperdense vessel or unexpected calcification. Skull: Normal. Negative for fracture or focal lesion.  Sinuses/Orbits: No acute finding. Other: None. IMPRESSION: Mild chronic ischemic white matter disease. No acute intracranial abnormality seen. Electronically Signed   By: Marijo Conception, M.D.   On: 09/15/2017 11:58    ____________________________________________   PROCEDURES  Procedure(s) performed:   Procedures   ____________________________________________   INITIAL IMPRESSION / ASSESSMENT AND PLAN / ED COURSE  Suspect her episodes could be related to seizures especially with starting after the head trauma and this prolonged confusion afterwards it could be a postictal state.  I  discussed the case with neurology who recommends MRI however patient cannot get MRI because she has a stimulator in her back without paperwork for it.  We will do a CT of her head without contrast and discussed with medicine for admission.   Pertinent labs & imaging results that were available during my care of the patient were reviewed by me and considered in my medical decision making (see chart for details).  ____________________________________________  FINAL CLINICAL IMPRESSION(S) / ED DIAGNOSES  Final diagnoses:  Dizziness  Confusion     MEDICATIONS GIVEN DURING THIS VISIT:  Medications  aspirin chewable tablet 81 mg (81 mg Oral Refused 09/15/17 1323)  atorvastatin (LIPITOR) tablet 20 mg (20 mg Oral Refused 09/15/17 1351)  HYDROcodone-acetaminophen (NORCO) 7.5-325 MG per tablet 1 tablet (not administered)  hydrOXYzine (ATARAX/VISTARIL) tablet 25 mg (not administered)  levothyroxine (SYNTHROID, LEVOTHROID) tablet 50 mcg (not administered)  quinapril (ACCUPRIL) tablet 40 mg (40 mg Oral Refused 09/15/17 1324)  sertraline (ZOLOFT) tablet 100 mg (100 mg Oral Refused 09/15/17 1326)  sodium chloride flush (NS) 0.9 % injection 3 mL (3 mLs Intravenous Not Given 09/15/17 1321)  enoxaparin (LOVENOX) injection 40 mg (40 mg Subcutaneous Given 09/15/17 1349)  0.9 %  sodium chloride infusion ( Intravenous New Bag/Given 09/15/17 1330)  acetaminophen (TYLENOL) tablet 650 mg (not administered)    Or  acetaminophen (TYLENOL) suppository 650 mg (not administered)  senna-docusate (Senokot-S) tablet 1 tablet (not administered)  ondansetron (ZOFRAN) tablet 4 mg (not administered)    Or  ondansetron (ZOFRAN) injection 4 mg (not administered)  alum & mag hydroxide-simeth (MAALOX/MYLANTA) 200-200-20 MG/5ML suspension 30 mL (not administered)  insulin aspart (novoLOG) injection 0-15 Units (not administered)  insulin aspart (novoLOG) injection 0-5 Units (not administered)  ketorolac (TORADOL) 30 MG/ML  injection 30 mg (not administered)  HYDROcodone-acetaminophen (NORCO) 10-325 MG per tablet 1 tablet (not administered)     NEW OUTPATIENT MEDICATIONS STARTED DURING THIS VISIT:  New Prescriptions   No medications on file    Note:  This note was prepared with assistance of Dragon voice recognition software. Occasional wrong-word or sound-a-like substitutions may have occurred due to the inherent limitations of voice recognition software.   Merrily Pew, MD 09/15/17 [phone]

## 2017-09-15 NOTE — Progress Notes (Signed)
EEG Completed; Results Pending  

## 2017-09-15 NOTE — Telephone Encounter (Signed)
Patient's family called to cancel new patient appt same day.  Pt fell and may need to go to ED.

## 2017-09-15 NOTE — Procedures (Signed)
EEG Report  Clinical History:  Recurrent syncope and confusion.    Technical Summary:  A 19 channel digital EEG recording was performed using the 10-20 international system of electrode placement.  Bipolar and Referential montages were used.  The total recording time was approx 20 minutes.  Findings:  There is a posterior dominant rhythm of 8 Hz reactive to eye opening and closure.  No focal slowing is present.  Photic stimulation produces a symmetrical driving response and does not elicit any abnormalities.  Hyperventilation is not performed.   There are no epileptiform discharges or electrographic seizures present.   Sleep is not recorded.  Impression:  This is a normal EEG in the awake.  This does not rule out underlying seizures.  If clinical suspicion remains high then an EEG with sleep deprivation or a more prolonged EEG may be of additional value.   Rogue Jury, MS, MD

## 2017-09-15 NOTE — Progress Notes (Signed)
Pt c/o itching and sweaty-odor under breast. Reddened area-Dr. Baltazar Najjar paged to see if Nystatin powder could be ordered. Will continue to monitor pt

## 2017-09-15 NOTE — H&P (Signed)
History and Physical  Shirley Leblanc OMV:[id number] DOB: [birth year] DOA: 09/15/2017  Referring physician: Dolly Rias  PCP: [name], FNP   Chief Complaint: recurrent dizziness and progressive weakness  HPI: Shirley Leblanc is a 72 y.o. female with multiple medical problems as detailed below the presents to the emergency department because of multiple progressive episodes of syncope and confusion.  According to the daughter, patient hit her head about 8 months ago and since that time she is had multiple episodes of loss of consciousness. The episodes only last 1-2 minutes and initially they were very rare but they continue to become more frequent.  They are associated with loss of bladder control and she has about 30 minutes of confusion after the event.  She is getting worked up by her primary care doctor and has a referral to a neurologist but the patient didn't want to wait and came to ED.  No full body shaking with these episodes.  The problem is the patient is with her daughter and it is hard to keep control of her when these things happen.  No history of seizures.  No history of passing out before this.  She has had a neurology appointment today but another episode happened this morning the daughter cannot hold her up.  She is been get physical therapy for generalized weakness at home does not seem to be improving from that standpoint.  No other associated or modifying symptoms.    ED Course: Neurology was consulted and recommended EEG.  CT head was negative.  MRI pending.  Pt does have a spinal stimulator but says that she has had MRI testing in the past with the stimulator in place.    CT brain: Mild chronic ischemic white matter disease. No acute intracranial abnormality seen  Carotid Duplex Study: IMPRESSION: 1. Mild (1-49%) stenosis proximal right internal carotid artery secondary to mild heterogeneous atherosclerotic plaque. 2. No significant atherosclerotic plaque or evidence of  tortuosity on the left. Of note, the left internal carotid artery is highly tortuous. 3. Vertebral arteries remain patent with antegrade flow.  Review of Systems: All systems reviewed and apart from history of presenting illness, are negative.  Past Medical History:  Diagnosis Date  . Bursitis   . CAD (coronary artery disease)    a. nonobstructive CAD with 40% Prox LAD stenosis by cath in 2007  . Depression   . DJD (degenerative joint disease)   . GERD (gastroesophageal reflux disease)   . Hyperlipemia   . Hypertension   . Left wrist fracture   . Obesities, morbid ([place])   . Open left ankle fracture   . Urinary incontinence    Past Surgical History:  Procedure Laterality Date  . CARDIAC CATHETERIZATION    . CHOLECYSTECTOMY N/A 05/14/2015   Procedure: LAPAROSCOPIC CHOLECYSTECTOMY;  Surgeon: Coralie Keens, MD;  Location: [facility];  Service: General;  Laterality: N/A;  . NECK SURGERY    . TOTAL KNEE ARTHROPLASTY     x2  . VAGINAL HYSTERECTOMY     Social History:  reports that  has never smoked. she has never used smokeless tobacco. She reports that she does not drink alcohol or use drugs.  Allergies  Allergen Reactions  . Contrast Media [Iodinated Diagnostic Agents] Anaphylaxis       . Penicillins Hives, Itching and Rash    Has patient had a PCN reaction causing immediate rash, facial/tongue/throat swelling, SOB or lightheadedness with hypotension Yes Has patient had a PCN reaction causing  severe rash involving mucus membranes or skin necrosis: Yes Has patient had a PCN reaction that required hospitalization No Has patient had a PCN reaction occurring within the last 10 years: No If all of the above answers are "NO", then may proceed with Cephalosporin use.   . Sulfa Antibiotics Hives, Itching and Rash    Family History  Problem Relation Age of Onset  . Lung disease Mother   . Heart attack Father   . Lung disease Unknown   . Heart attack Unknown   . Cancer Daughter          breast    Prior to Admission medications   Medication Sig Start Date End Date Taking? Authorizing Provider  albuterol (PROVENTIL HFA;VENTOLIN HFA) 108 (90 Base) MCG/ACT inhaler TAKE 2 PUFFS BY MOUTH EVERY 6 HOURS AS NEEDED FOR WHEEZE OR SHORTNESS OF BREATH 08/07/17   Evelina Dun A, FNP  aspirin 81 MG EC tablet Take 81 mg by mouth daily.      [provider]  atorvastatin (LIPITOR) 20 MG tablet Take 1 tablet (20 mg total) by mouth daily. 01/13/17   [name], FNP  blood glucose meter kit and supplies KIT Dispense per insurance preference. Use up to four times daily . E 11.9 09/30/16   Hawks, Christy A, FNP  Cholecalciferol (VITAMIN D3) 2000 UNITS TABS Take 2,000 Units by mouth daily.     [provider]  furosemide (LASIX) 20 MG tablet Take 1 tablet (20 mg total) by mouth daily. 05/17/15   Modena Jansky, MD  glucose blood (GLUCOSE METER TEST) test strip Use BID 09/29/16   Evelina Dun A, FNP  HYDROcodone-acetaminophen (NORCO) 7.5-325 MG tablet Take 1-2 tablets by mouth every 8 (eight) hours as needed for moderate pain. Patient taking differently: Take 1 tablet by mouth 3 (three) times daily.  07/14/17   Evelina Dun A, FNP  hydrOXYzine (ATARAX/VISTARIL) 50 MG tablet Take 1 tablet (50 mg total) by mouth 3 (three) times daily as needed. Patient taking differently: Take 50 mg by mouth 2 (two) times daily.  08/07/17   Evelina Dun A, FNP  levothyroxine (SYNTHROID, LEVOTHROID) 50 MCG tablet TAKE 1 TABLET (50 MCG TOTAL) BY MOUTH DAILY. 05/04/17   Hassell Done, Mary-Margaret, FNP  nitrofurantoin, macrocrystal-monohydrate, (MACROBID) 100 MG capsule Take 1 capsule (100 mg total) by mouth 2 (two) times daily. X 7 days 08/22/17   Mesner, Corene Cornea, MD  quinapril (ACCUPRIL) 40 MG tablet TAKE 1 TABLET (40 MG TOTAL) BY MOUTH DAILY. 04/29/17   Evelina Dun A, FNP  sertraline (ZOLOFT) 100 MG tablet Take 1 tablet (100 mg total) by mouth daily. 06/23/17 12/20/17  [name], FNP    Physical Exam: Vitals:   09/15/17 1003 09/15/17 1022  BP:  102/66  Pulse:  86  Resp:  18  Temp:  98.1 F (36.7 C)  TempSrc:  Oral  SpO2:  92%  Weight: 117.9 kg (260 lb)   Height: 5' 6"  (1.676 m)      General exam: Moderately built and nourished patient, lying comfortably supine on the gurney in no obvious distress.  Head, eyes and ENT: Nontraumatic and normocephalic. Pupils equally reacting to light and accommodation. Oral mucosa moist.  Neck: Supple. No JVD, carotid bruit or thyromegaly.  Lymphatics: No lymphadenopathy.  Respiratory system: Clear to auscultation. No increased work of breathing.  Cardiovascular system: S1 and S2 heard, RRR. No JVD, murmurs, gallops, clicks or pedal edema.  Gastrointestinal system: Abdomen is nondistended, soft and nontender. Normal  bowel sounds heard. No organomegaly or masses appreciated.  Central nervous system: Alert and oriented. No focal neurological deficits.  Extremities: Symmetric 5 x 5 power. Peripheral pulses symmetrically felt.   Skin: No rashes or acute findings.  Musculoskeletal system: Negative exam.  Psychiatry: Pleasant and cooperative.  Labs on Admission:  Basic Metabolic Panel: Recent Labs  Lab 09/15/17 1038  NA 140  K 4.2  CL 97*  CO2 34*  GLUCOSE 111*  BUN 13  CREATININE 0.77  CALCIUM 9.6  MG 1.7   Liver Function Tests: Recent Labs  Lab 09/15/17 1038  AST 22  ALT 16  ALKPHOS 82  BILITOT 0.3  PROT 7.0  ALBUMIN 3.7   No results for input(s): LIPASE, AMYLASE in the last 168 hours. No results for input(s): AMMONIA in the last 168 hours. CBC: Recent Labs  Lab 09/15/17 1038  WBC 7.4  NEUTROABS 4.4  HGB 13.0  HCT 41.1  MCV 92.4  PLT 177   Cardiac Enzymes: Recent Labs  Lab 09/15/17 1038  TROPONINI <0.03    BNP (last 3 results) No results for input(s): PROBNP in the last 8760 hours. CBG: No results for input(s): GLUCAP in the last 168 hours.  Radiological Exams on Admission: Dg  Chest 2 View  Result Date: 09/15/2017 CLINICAL DATA:  Headache, dizziness and nausea since a fall 8 months ago. EXAM: CHEST  2 VIEW COMPARISON:  CT chest 04/29/2017. PA and lateral chest 08/24/2017 and 04/28/2017. FINDINGS: Mild linear atelectasis is seen in the lingula and right base. No consolidative process, edema, pneumothorax or effusion. Heart size is upper normal. Aortic atherosclerosis is seen. Spinal stimulator is in place. No acute bony abnormality. Remote surgical neck fracture left humerus is noted. IMPRESSION: No acute disease. Atherosclerosis. Electronically Signed   By: Inge Rise M.D.   On: 09/15/2017 10:49   Ct Head Wo Contrast  Result Date: 09/15/2017 CLINICAL DATA:  Headache, dizziness. EXAM: CT HEAD WITHOUT CONTRAST TECHNIQUE: Contiguous axial images were obtained from the base of the skull through the vertex without intravenous contrast. COMPARISON:  CT scan of August 22, 2017. FINDINGS: Brain: Mild chronic ischemic white matter disease is noted. No mass effect or midline shift is noted. Ventricular size is within normal limits. There is no evidence of mass lesion, hemorrhage or acute infarction. Vascular: No hyperdense vessel or unexpected calcification. Skull: Normal. Negative for fracture or focal lesion. Sinuses/Orbits: No acute finding. Other: None. IMPRESSION: Mild chronic ischemic white matter disease. No acute intracranial abnormality seen. Electronically Signed   By: Marijo Conception, M.D.   On: 09/15/2017 11:58    Assessment/Plan Active Problems:   Dyspnea on exertion   Hypertension associated with diabetes (HCC)   Atrial fibrillation (HCC)   Chronic anticoagulation - Eliquis, CHADS2VASC=3   Hypothyroid   Hyperlipidemia associated with type 2 diabetes mellitus ([place])   Depression   Diabetes ([place])   Opioid dependence ([place])   Morbid obesity ([place])   Osteopenia   At HIGH RISK for fall   Syncope   CAD (coronary artery disease)  1. Near syncopal episodes -  These episodes are concerning for absence seizures.  Adult EEG has been ordered. Continue telemetry monitoring.  Neuro checks ordered. Neurology consult requested.  Obtain carotid duplex study, echocardiogram, try to obtain MRI brain without contrast if allowed.  Pt did have MRI in 2003 and she says that the spinal stimulator was in place at the time of that study.  She has had it for a long time.  2. CAD - stable, no symptoms, monitor telemetry and resume home cardiac medications.  3. Hypothyroidism - I think it is worth checking TSH in the setting of these bizarre episodes that she is having and associated weakness.  4. Generalized weakness - mostly associated with the near syncopal episodes, will get PT evaluation.  Pursue metabolic work up.  Obtain B12 level.  Check Vit D.  5. Atrial fibrillation - rate controlled and anticoagulated with eliquis - continue current treatments.  6. Type 2 diabetes mellitus - treat with sliding scale coverage and check CBGs.  7. Hyperlipidemia - check lipid panel in AM.  8. Essential Hypertension - continue blood pressure management with home medications.   DVT Prophylaxis: eliquis Code Status: DNR  Family Communication: daughter   Disposition Plan: TBD   Time spent: 31 mins  Irwin Brakeman, MD [facility] Pager [phone]  If 7PM-7AM, please contact night-coverage [email or web address] Password [facility] 09/15/2017, 12:45 PM

## 2017-09-15 NOTE — ED Notes (Signed)
DNR armband placed on pt per MD order.

## 2017-09-15 NOTE — ED Triage Notes (Signed)
Pt brought in by [facility] with c/o posterior headache, dizziness, nausea that has been going on since fall 8 months ago. Pt was seen by PCP and was told to f/u with neurology. Pt had neurology appt today but felt like she couldn't make it due to the dizziness, headache and nausea.

## 2017-09-16 ENCOUNTER — Observation Stay (HOSPITAL_BASED_OUTPATIENT_CLINIC_OR_DEPARTMENT_OTHER): Payer: Medicare Other

## 2017-09-16 ENCOUNTER — Observation Stay (HOSPITAL_COMMUNITY): Payer: Medicare Other

## 2017-09-16 DIAGNOSIS — E1169 Type 2 diabetes mellitus with other specified complication: Secondary | ICD-10-CM | POA: Diagnosis not present

## 2017-09-16 DIAGNOSIS — R55 Syncope and collapse: Secondary | ICD-10-CM | POA: Diagnosis not present

## 2017-09-16 DIAGNOSIS — E785 Hyperlipidemia, unspecified: Secondary | ICD-10-CM | POA: Diagnosis not present

## 2017-09-16 DIAGNOSIS — E039 Hypothyroidism, unspecified: Secondary | ICD-10-CM

## 2017-09-16 DIAGNOSIS — E1165 Type 2 diabetes mellitus with hyperglycemia: Secondary | ICD-10-CM | POA: Diagnosis not present

## 2017-09-16 DIAGNOSIS — I10 Essential (primary) hypertension: Secondary | ICD-10-CM | POA: Diagnosis not present

## 2017-09-16 DIAGNOSIS — I480 Paroxysmal atrial fibrillation: Secondary | ICD-10-CM

## 2017-09-16 DIAGNOSIS — R270 Ataxia, unspecified: Secondary | ICD-10-CM | POA: Diagnosis not present

## 2017-09-16 DIAGNOSIS — E1159 Type 2 diabetes mellitus with other circulatory complications: Secondary | ICD-10-CM

## 2017-09-16 DIAGNOSIS — I340 Nonrheumatic mitral (valve) insufficiency: Secondary | ICD-10-CM | POA: Diagnosis not present

## 2017-09-16 DIAGNOSIS — F332 Major depressive disorder, recurrent severe without psychotic features: Secondary | ICD-10-CM | POA: Diagnosis not present

## 2017-09-16 DIAGNOSIS — R4182 Altered mental status, unspecified: Secondary | ICD-10-CM | POA: Diagnosis not present

## 2017-09-16 LAB — COMPREHENSIVE METABOLIC PANEL
ALT: 15 U/L (ref 14–54)
ANION GAP: 9 (ref 5–15)
AST: 19 U/L (ref 15–41)
Albumin: 3.4 g/dL — ABNORMAL LOW (ref 3.5–5.0)
Alkaline Phosphatase: 75 U/L (ref 38–126)
BUN: 16 mg/dL (ref 6–20)
CHLORIDE: 98 mmol/L — AB (ref 101–111)
CO2: 31 mmol/L (ref 22–32)
Calcium: 9.4 mg/dL (ref 8.9–10.3)
Creatinine, Ser: 0.71 mg/dL (ref 0.44–1.00)
GFR calc Af Amer: >60 mL/min (ref >60)
GFR calc non Af Amer: >60 mL/min (ref >60)
GLUCOSE: 118 mg/dL — AB (ref 65–99)
POTASSIUM: 3.6 mmol/L (ref 3.5–5.1)
SODIUM: 138 mmol/L (ref 135–145)
Total Bilirubin: 0.6 mg/dL (ref 0.3–1.2)
Total Protein: 6.4 g/dL — ABNORMAL LOW (ref 6.5–8.1)

## 2017-09-16 LAB — CBC WITH DIFFERENTIAL/PLATELET
BASOS ABS: 0 10*3/uL (ref 0.0–0.1)
BASOS PCT: 0 %
Eosinophils Absolute: 0.3 10*3/uL (ref 0.0–0.7)
Eosinophils Relative: 4 %
HEMATOCRIT: 40 % (ref 36.0–46.0)
HEMOGLOBIN: 12.5 g/dL (ref 12.0–15.0)
LYMPHS PCT: 41 %
Lymphs Abs: 2.5 10*3/uL (ref 0.7–4.0)
MCH: 29 pg (ref 26.0–34.0)
MCHC: 31.3 g/dL (ref 30.0–36.0)
MCV: 92.8 fL (ref 78.0–100.0)
MONO ABS: 0.4 10*3/uL (ref 0.1–1.0)
MONOS PCT: 6 %
NEUTROS ABS: 3.1 10*3/uL (ref 1.7–7.7)
NEUTROS PCT: 49 %
Platelets: 163 10*3/uL (ref 150–400)
RBC: 4.31 MIL/uL (ref 3.87–5.11)
RDW: 12.8 % (ref 11.5–15.5)
WBC: 6.2 10*3/uL (ref 4.0–10.5)

## 2017-09-16 LAB — URINALYSIS, ROUTINE W REFLEX MICROSCOPIC
Bacteria, UA: NONE SEEN
Bilirubin Urine: NEGATIVE
Glucose, UA: NEGATIVE mg/dL
Hgb urine dipstick: NEGATIVE
Ketones, ur: NEGATIVE mg/dL
Nitrite: NEGATIVE
PH: 6 (ref 5.0–8.0)
Protein, ur: NEGATIVE mg/dL
SPECIFIC GRAVITY, URINE: 1.021 (ref 1.005–1.030)

## 2017-09-16 LAB — MAGNESIUM: Magnesium: 1.6 mg/dL — ABNORMAL LOW (ref 1.7–2.4)

## 2017-09-16 LAB — LIPID PANEL
Cholesterol: 189 mg/dL (ref 0–200)
HDL: 51 mg/dL (ref >40)
LDL CALC: 110 mg/dL — AB (ref 0–99)
TRIGLYCERIDES: 138 mg/dL (ref <150)
Total CHOL/HDL Ratio: 3.7 RATIO
VLDL: 28 mg/dL (ref 0–40)

## 2017-09-16 LAB — GLUCOSE, CAPILLARY
GLUCOSE-CAPILLARY: 118 mg/dL — AB (ref 65–99)
GLUCOSE-CAPILLARY: 135 mg/dL — AB (ref 65–99)
GLUCOSE-CAPILLARY: 90 mg/dL (ref 65–99)
GLUCOSE-CAPILLARY: 127 mg/dL — AB (ref 65–99)
Glucose-Capillary: 125 mg/dL — ABNORMAL HIGH (ref 65–99)

## 2017-09-16 LAB — ECHOCARDIOGRAM COMPLETE
HEIGHTINCHES: 66 in
WEIGHTICAEL: 4183.45 [oz_av]

## 2017-09-16 MED ORDER — MAGNESIUM SULFATE 4 GM/100ML IV SOLN
4.0000 g | Freq: Once | INTRAVENOUS | Status: AC
  Administered 2017-09-16: 4 g via INTRAVENOUS
  Filled 2017-09-16: qty 100

## 2017-09-16 MED ORDER — CLONAZEPAM 0.5 MG PO TABS
0.5000 mg | ORAL_TABLET | Freq: Every day | ORAL | Status: DC
  Administered 2017-09-16 – 2017-09-18 (×3): 0.5 mg via ORAL
  Filled 2017-09-16 (×3): qty 1

## 2017-09-16 MED ORDER — LISINOPRIL 10 MG PO TABS
40.0000 mg | ORAL_TABLET | Freq: Every day | ORAL | Status: DC
  Administered 2017-09-17 – 2017-09-19 (×3): 40 mg via ORAL
  Filled 2017-09-16 (×3): qty 4

## 2017-09-16 NOTE — Evaluation (Signed)
Occupational Therapy Evaluation Patient Details Name: Shirley Leblanc MRN: [id number] DOB: 1946-01-30 Today's Date: 09/16/2017    History of Present Illness  Shirley Leblanc is a 72 y.o. female with multiple medical problems as detailed below the presents to the emergency department because of multiple progressive episodes of syncope and confusion.  According to the daughter, patient hit her head about 8 months ago and since that time she is had multiple episodes of loss of consciousness. The episodes only last 1-2 minutes and initially they were very rare but they continue to become more frequent.  They are associated with loss of bladder control and she has about 30 minutes of confusion after the event.  She is getting worked up by her primary care doctor and has a referral to a neurologist but the patient didn't want to wait and came to ED.  No full body shaking with these episodes.  The problem is the patient is with her daughter and it is hard to keep control of her when these things happen.  No history of seizures.  No history of passing out before this.  She has had a neurology appointment today but another episode happened this morning the daughter cannot hold her up.  She is been get physical therapy for generalized weakness at home does not seem to be improving from that standpoint.  No other associated or modifying symptoms.     Clinical Impression   Pt received supine in bed, agreeable to OT evaluation, PT joined in shortly after beginning evaluation. Pt's granddaughter assisting with ADL completion at home. During evaluation pt limited due to pain and generalized weakness. Pt is unable to complete standing tasks due to poor tolerance of standing. Pt currently requiring high level of assistance with ADLs and functional mobility tasks, recommend SNF on discharge to improve safety and independence in B/ADLs and reduce caregiver burden. No further acute OT needs at this time.     Follow Up  Recommendations  SNF    Equipment Recommendations  None recommended by OT       Precautions / Restrictions Precautions Precautions: Fall Precaution Comments: fall on 09/11/17  Restrictions Weight Bearing Restrictions: No      Mobility Bed Mobility               General bed mobility comments: Defer to PT note  Transfers                 General transfer comment: Defer to PT note        ADL either performed or assessed with clinical judgement   ADL Overall ADL's : Needs assistance/impaired Eating/Feeding: Set up;Sitting   Grooming: Set up;Sitting Grooming Details (indicate cue type and reason): Pt unable to maintain standing for ADL completion while standing at sink             Lower Body Dressing: Maximal assistance;Sitting/lateral leans                 General ADL Comments: Pt requiring increased level of assistance with ADLs due to pain and weakness     Vision Baseline Vision/History: No visual deficits Patient Visual Report: No change from baseline Vision Assessment?: No apparent visual deficits            Pertinent Vitals/Pain Pain Assessment: 0-10 Pain Score: 6  Pain Location: all over-generalized Pain Descriptors / Indicators: Aching;Constant Pain Intervention(s): Limited activity within patient's tolerance;Monitored during session;Repositioned     Hand Dominance Right   Extremity/Trunk  Assessment Upper Extremity Assessment Upper Extremity Assessment: Generalized weakness;LUE deficits/detail(RUE grossly 4-/5) LUE Deficits / Details: pt with chronic rotator cuff tear limiting ROM and strength. Strength grossly 3/5 LUE Sensation: WNL LUE Coordination: WNL   Lower Extremity Assessment Lower Extremity Assessment: Defer to PT evaluation   Cervical / Trunk Assessment Cervical / Trunk Assessment: Normal   Communication Communication Communication: No difficulties   Cognition Arousal/Alertness: Awake/alert Behavior During  Therapy: WFL for tasks assessed/performed Overall Cognitive Status: Within Functional Limits for tasks assessed                                                Home Living Family/patient expects to be discharged to:: Private residence Living Arrangements: Other relatives(granddaughter) Available Help at Discharge: Family;Available 24 hours/day Type of Home: Mobile home Home Access: Stairs to enter Entrance Stairs-Number of Steps: 3 Entrance Stairs-Rails: Left Home Layout: One level     Bathroom Shower/Tub: Occupational psychologist: Standard     Home Equipment: Environmental consultant - 4 wheels;Shower seat;Grab bars - tub/shower   Additional Comments: Pt granddaughter with pt 24/7      Prior Functioning/Environment Level of Independence: Needs assistance  Gait / Transfers Assistance Needed: Pt uses rollator for functional mobility along with granddaughter assistance ADL's / Homemaking Assistance Needed: granddaughter assists with all B/IADLs            OT Problem List: Decreased strength;Decreased activity tolerance;Impaired balance (sitting and/or standing);Pain                       Co-evaluation PT/OT/SLP Co-Evaluation/Treatment: Yes Reason for Co-Treatment: Complexity of the patient's impairments (multi-system involvement);For patient/therapist safety;To address functional/ADL transfers   OT goals addressed during session: ADL's and self-care      AM-PAC PT "6 Clicks" Daily Activity     Outcome Measure Help from another person eating meals?: None Help from another person taking care of personal grooming?: A Little Help from another person toileting, which includes using toliet, bedpan, or urinal?: A Little Help from another person bathing (including washing, rinsing, drying)?: A Lot Help from another person to put on and taking off regular upper body clothing?: A Little Help from another person to put on and taking off regular lower body  clothing?: A Lot 6 Click Score: 17   End of Session Equipment Utilized During Treatment: Rolling walker  Activity Tolerance: Patient limited by pain Patient left: in bed;with call bell/phone within reach  OT Visit Diagnosis: Muscle weakness (generalized) (M62.81)                Time: 8786-7672 OT Time Calculation (min): 28 min Charges:  OT General Charges $OT Visit: 1 Visit OT Evaluation $OT Eval Low Complexity: 1 Low    Guadelupe Sabin, OTR/L  [phone] 09/16/2017, 9:52 AM

## 2017-09-16 NOTE — Care Management Obs Status (Signed)
[facility] NOTIFICATION   Patient Details  Name: Shirley Leblanc MRN: [id number] Date of Birth: 1946/03/22   Medicare Observation Status Notification Given:  Yes    Japji Kok, Chauncey Reading, RN 09/16/2017, 2:47 PM

## 2017-09-16 NOTE — Plan of Care (Signed)
  Acute Rehab PT Goals(only PT should resolve) Pt Will Go Supine/Side To Sit 09/16/2017 1225 - Progressing by Lonell Grandchild, PT Flowsheets Taken 09/16/2017 1225  Pt will go Supine/Side to Sit with supervision Patient Will Transfer Sit To/From Stand 09/16/2017 1225 - Progressing by Lonell Grandchild, PT Flowsheets Taken 09/16/2017 1225  Patient will transfer sit to/from stand with min guard assist Pt Will Transfer Bed To Chair/Chair To Bed 09/16/2017 1225 - Progressing by Lonell Grandchild, PT Flowsheets Taken 09/16/2017 1225  Pt will Transfer Bed to Chair/Chair to Bed min guard assist Pt Will Ambulate 09/16/2017 1225 - Progressing by Lonell Grandchild, PT Flowsheets Taken 09/16/2017 1225  Pt will Ambulate with min guard assist;25 feet;with rolling walker  12:25 PM, 09/16/17 Lonell Grandchild, MPT Physical Therapist with [facility] [phone] office [phone] mobile phone

## 2017-09-16 NOTE — Evaluation (Signed)
Physical Therapy Evaluation Patient Details Name: Shirley Leblanc MRN: [id number] DOB: 31-Dec-1945 Today's Date: 09/16/2017   History of Present Illness   Shirley Leblanc is a 72 y.o. female with multiple medical problems as detailed below the presents to the emergency department because of multiple progressive episodes of syncope and confusion.  According to the daughter, patient hit her head about 8 months ago and since that time she is had multiple episodes of loss of consciousness. The episodes only last 1-2 minutes and initially they were very rare but they continue to become more frequent.  They are associated with loss of bladder control and she has about 30 minutes of confusion after the event.  She is getting worked up by her primary care doctor and has a referral to a neurologist but the patient didn't want to wait and came to ED.  No full body shaking with these episodes.  The problem is the patient is with her daughter and it is hard to keep control of her when these things happen.  No history of seizures.  No history of passing out before this.  She has had a neurology appointment today but another episode happened this morning the daughter cannot hold her up.  She is been get physical therapy for generalized weakness at home does not seem to be improving from that standpoint.  No other associated or modifying symptoms.      Clinical Impression  Patient limited to a few side steps at bedside mostly due to c/o dizziness, patient also c/o fatigue and generalized weakness and put back to bed for safety.  Orthostatic BP as follows: lying 59/84, sitting 168/130, standing 135/79.  Patient will benefit from continued physical therapy in hospital and recommended venue below to increase strength, balance, endurance for safe ADLs and gait.    Follow Up Recommendations SNF;Supervision/Assistance - 24 hour    Equipment Recommendations  None recommended by PT    Recommendations for Other Services        Precautions / Restrictions Precautions Precautions: Fall Precaution Comments: fall on 09/11/17  Restrictions Weight Bearing Restrictions: No      Mobility  Bed Mobility Overal bed mobility: Needs Assistance Bed Mobility: Supine to Sit;Sit to Supine     Supine to sit: Min guard Sit to supine: Min assist   General bed mobility comments: has to use siderail for supine to sit  Transfers Overall transfer level: Needs assistance Equipment used: Rolling walker (2 wheeled) Transfers: Sit to/from Stand           General transfer comment: slow labored movement  Ambulation/Gait Ambulation/Gait assistance: Mod assist Ambulation Distance (Feet): 4 Feet Assistive device: Rolling walker (2 wheeled) Gait Pattern/deviations: Decreased step length - right;Decreased step length - left   Gait velocity interpretation: Below normal speed for age/gender General Gait Details: limited to 5-6 unsteady labored side steps at bedside due to c/o increasing dizziness and fatigue  Stairs            Wheelchair Mobility    Modified Rankin (Stroke Patients Only)       Balance Overall balance assessment: Needs assistance Sitting-balance support: No upper extremity supported;Feet supported Sitting balance-Leahy Scale: Good     Standing balance support: Bilateral upper extremity supported;During functional activity Standing balance-Leahy Scale: Poor Standing balance comment: fair/poor with RW                             Pertinent Vitals/Pain  Pain Score: 6  Pain Location: all over-generalized, mostly in low back Pain Descriptors / Indicators: Aching;Constant Pain Intervention(s): Limited activity within patient's tolerance;Monitored during session    [place] expects to be discharged to:: Private residence Living Arrangements: Other relatives Available Help at Discharge: Family;Available 24 hours/day Type of Home: Mobile home Home Access: Stairs to  enter Entrance Stairs-Rails: Left Entrance Stairs-Number of Steps: 3 Home Layout: One level Home Equipment: Walker - 4 wheels;Shower seat;Grab bars - tub/shower Additional Comments: Pt granddaughter with pt 24/7    Prior Function Level of Independence: Needs assistance   Gait / Transfers Assistance Needed: Pt uses rollator for functional mobility along with granddaughter assistance  ADL's / Homemaking Assistance Needed: granddaughter assists with all B/IADLs        Hand Dominance   Dominant Hand: Right    Extremity/Trunk Assessment   Upper Extremity Assessment Upper Extremity Assessment: Defer to OT evaluation    Lower Extremity Assessment Lower Extremity Assessment: Generalized weakness;RLE deficits/detail;LLE deficits/detail RLE Deficits / Details: grossly 3+/5 (RLE weakness baseline per patient) LLE Deficits / Details: grossly -4/5       Communication   Communication: No difficulties  Cognition Arousal/Alertness: Awake/alert Behavior During Therapy: WFL for tasks assessed/performed Overall Cognitive Status: Within Functional Limits for tasks assessed                                        General Comments      Exercises     Assessment/Plan    PT Assessment Patient needs continued PT services  PT Problem List Decreased strength;Decreased activity tolerance;Decreased balance;Decreased mobility       PT Treatment Interventions Gait training;Functional mobility training;Therapeutic activities;Therapeutic exercise;Patient/family education;Stair training    PT Goals (Current goals can be found in the Care Plan section)  Acute Rehab PT Goals Patient Stated Goal: return home with grandaughter to assist Time For Goal Achievement: 09/30/17 Potential to Achieve Goals: Good    Frequency Min 3X/week   Barriers to discharge        Co-evaluation PT/OT/SLP Co-Evaluation/Treatment: Yes Reason for Co-Treatment: Complexity of the patient's  impairments (multi-system involvement);For patient/therapist safety;To address functional/ADL transfers PT goals addressed during session: Mobility/safety with mobility;Balance;Proper use of DME         AM-PAC PT "6 Clicks" Daily Activity  Outcome Measure Difficulty turning over in bed (including adjusting bedclothes, sheets and blankets)?: None Difficulty moving from lying on back to sitting on the side of the bed? : A Little Difficulty sitting down on and standing up from a chair with arms (e.g., wheelchair, bedside commode, etc,.)?: A Lot Help needed moving to and from a bed to chair (including a wheelchair)?: A Lot Help needed walking in hospital room?: A Lot Help needed climbing 3-5 steps with a railing? : Total 6 Click Score: 14    End of Session Equipment Utilized During Treatment: Oxygen Activity Tolerance: Patient tolerated treatment well;Patient limited by fatigue Patient left: in bed;with call bell/phone within reach Nurse Communication: Mobility status PT Visit Diagnosis: Unsteadiness on feet (R26.81);Other abnormalities of gait and mobility (R26.89);Muscle weakness (generalized) (M62.81)    Time: 6761-9509 PT Time Calculation (min) (ACUTE ONLY): 24 min   Charges:   PT Evaluation $PT Eval Moderate Complexity: 1 Mod PT Treatments $Therapeutic Activity: 23-37 mins   PT G Codes:        12:24 PM, 2017-09-27 Shirley Leblanc, MPT Physical Therapist  with [facility] 336 [phone] office [phone] mobile phone

## 2017-09-16 NOTE — Care Management Note (Addendum)
Case Management Note  Patient Details  Name: TIPHANY FAYSON MRN: [id number] Date of Birth: 1946/05/04  Subjective/Objective:  Adm with Syncope. From home with Grandaughter. Had been walking with a RW until recently. She has continuous oxygen provided by [facility].  Patient is active with [facility] for OT, RN, ST and PT services per [facility] with [facility]. CM called patient's grandaughter and discussed plans to return home with resumption of HH. Most likely patient will need a WC and ramp to return home.                   Action/Plan: DC home with resumption of HH and 24 supervision by granddaughter.   Expected Discharge Date:  09/17/17               Expected Discharge Plan:  [place]  In-House Referral:     Discharge planning Services  CM Consult  Post Acute Care Choice:  Home Health, Resumption of Svcs/PTA Provider Choice offered to:  Patient  DME Arranged:    DME Agency:     HH Arranged:    [place] Agency:  [facility]  Status of Service:  Completed, signed off  If discussed at [place] of Stay Meetings, dates discussed:    Additional Comments:  Blaze Nylund, Chauncey [place], RN 09/16/2017, 2:56 PM

## 2017-09-16 NOTE — Progress Notes (Signed)
PROGRESS NOTE    Shirley Leblanc  TGG:[id number] DOB: 19-Mar-1946 DOA: 09/15/2017 PCP: [name], FNP   Brief Narrative:  Patient is 72 year old female with prior history of a fall presented to the ED with worsening generalized weakness, multiple episodes of near syncopal episodes, confusion, tremors on standing, urinary incontinence with some associated confusion per family.  Patient admitted for further workup.  Concern for possible seizure disorder.  Patient seen in consultation by neurology who feel patient has orthostatic tremors.   Assessment & Plan:   Principal Problem:   Near syncope Active Problems:   Dyspnea on exertion   Hypertension associated with diabetes (HCC)   Atrial fibrillation (HCC)   Chronic anticoagulation - Eliquis, CHADS2VASC=3   Hypothyroid   Hyperlipidemia associated with type 2 diabetes mellitus ([place])   Depression   Diabetes ([place])   Opioid dependence ([place])   Morbid obesity ([place])   Osteopenia   At HIGH RISK for fall   Syncope   CAD (coronary artery disease)  #1 near syncope/orthostatic tremors Patient presented with increasing falls, near syncopal episodes, shaking on standing, and also noted some urinary incontinence.  Patient and daughter however state urinary incontinence is chronic in nature and was not associated with symptoms of shaking on standing and near syncopal episodes.  Head CT negative.  MRI pending, family with removed to turn of spinal stimulator at bedside while awaiting MRI to be done.  EEG with no significant findings of seizures.  Carotid Dopplers with no significant ICA stenosis.  2D echo with no source of embolus and normal ejection fraction.  Orthostatics have been checked which are essentially negative.  Patient on IV fluids.  Patient has been seen in consultation by neurology who feel patient's symptoms are likely secondary to orthostatic tremors and I recommended starting low-dose clonazepam at bedtime for further evaluation.   PT/OT.  Likely require skilled nursing facility.  2.  Coronary artery disease Asymptomatic.  Stable.  2D echo with EF of 70-35%, grade 1 diastolic dysfunction, no pericardial effusion noted.  Continue cardiac regimen of aspirin, Lipitor, lisinopril.  Diuretics of Lasix on hold.  Follow.  3.  Hypothyroidism Continue home dose Synthroid.  4.  Morbid obesity  5.  Hypertension Stable.  Continue lisinopril.  Follow.  6.  Hyperlipidemia Continue statin.  7.  Atrial fibrillation Currently rate controlled.  Aspirin.  8.  Well controlled diabetes mellitus Hemoglobin A1c 5.7.  Have ranged from 90-135.  Continue sliding scale insulin.  9.  Generalized weakness PT/OT.  Needs skilled nursing facility.  10.  Depression Continue Zoloft.   DVT prophylaxis: Lovenox/Eliquis Code Status: DNR Family Communication: Updated daughter at bedside. Disposition Plan: Skilled nursing facility once medically stable with clinical improvement.   Consultants:   Neurology: Dr. Phillips Odor 09/16/2017  Procedures:   CT head 09/15/2017  Chest x-ray 09/15/2017  Carotid ultrasound 09/15/2017  2D echo 09/16/2017  MRI head pending  EEG 09/15/2017  Antimicrobials:   None   Subjective: Patient denies any chest pain.  No shortness of breath.  Patient does endorse some significant tremors on standing up.  Per daughter patient with increasing falls.  Per daughter no actual loss of consciousness.  Urinary incontinence per family is chronic.  Objective: Vitals:   09/15/17 2126 09/16/17 0200 09/16/17 0500 09/16/17 0600  BP: (!) 151/79 (!) 151/71  (!) 152/82  Pulse: 67 65  71  Resp: 18 18  18   Temp: 98.4 F (36.9 C) (!) 97.5 F (36.4 C)  98.5 F (  36.9 C)  TempSrc: Oral Oral  Axillary  SpO2:  96%  97%  Weight:   118.6 kg (261 lb 7.5 oz) 118.6 kg (261 lb 7.5 oz)  Height:        Intake/Output Summary (Last 24 hours) at 09/16/2017 1134 Last data filed at 09/16/2017 0911 Gross per 24 hour  Intake  1175 ml  Output 300 ml  Net 875 ml   Filed Weights   09/15/17 1826 09/16/17 0500 09/16/17 0600  Weight: 117.9 kg (260 lb) 118.6 kg (261 lb 7.5 oz) 118.6 kg (261 lb 7.5 oz)    Examination:  General exam: Appears calm and comfortable  Respiratory system: Clear to auscultation anterior lung fields.Marland Kitchen Respiratory effort normal. Cardiovascular system: S1 & S2 heard, RRR. No JVD, murmurs, rubs, gallops or clicks. No pedal edema. Gastrointestinal system: Abdomen is nondistended, soft and nontender. No organomegaly or masses felt. Normal bowel sounds heard. Central nervous system: Alert and oriented. No focal neurological deficits. Extremities: Symmetric 5 x 5 power. Skin: No rashes, lesions or ulcers Psychiatry: Judgement and insight appear normal. Mood & affect appropriate.     Data Reviewed: I have personally reviewed following labs and imaging studies  CBC: Recent Labs  Lab 09/15/17 1038 09/16/17 0554  WBC 7.4 6.2  NEUTROABS 4.4 3.1  HGB 13.0 12.5  HCT 41.1 40.0  MCV 92.4 92.8  PLT 177 161   Basic Metabolic Panel: Recent Labs  Lab 09/15/17 1038 09/16/17 0554  NA 140 138  K 4.2 3.6  CL 97* 98*  CO2 34* 31  GLUCOSE 111* 118*  BUN 13 16  CREATININE 0.77 0.71  CALCIUM 9.6 9.4  MG 1.7 1.6*   GFR: Estimated Creatinine Clearance: 83.3 mL/min (by C-G formula based on SCr of 0.71 mg/dL). Liver Function Tests: Recent Labs  Lab 09/15/17 1038 09/16/17 0554  AST 22 19  ALT 16 15  ALKPHOS 82 75  BILITOT 0.3 0.6  PROT 7.0 6.4*  ALBUMIN 3.7 3.4*   No results for input(s): LIPASE, AMYLASE in the last 168 hours. No results for input(s): AMMONIA in the last 168 hours. Coagulation Profile: No results for input(s): INR, PROTIME in the last 168 hours. Cardiac Enzymes: Recent Labs  Lab 09/15/17 1038 09/15/17 1241 09/15/17 2047  TROPONINI <0.03 <0.03 <0.03   BNP (last 3 results) No results for input(s): PROBNP in the last 8760 hours. HbA1C: Recent Labs     09/15/17 1241  HGBA1C 5.7*   CBG: Recent Labs  Lab 09/15/17 1741 09/15/17 1814 09/15/17 2249 09/16/17 0734 09/16/17 1104  GLUCAP 99 125* 114* 118* 135*   Lipid Profile: Recent Labs    09/16/17 0558  CHOL 189  HDL 51  LDLCALC 110*  TRIG 138  CHOLHDL 3.7   Thyroid Function Tests: Recent Labs    09/15/17 1038  TSH 3.060   Anemia Panel: No results for input(s): VITAMINB12, FOLATE, FERRITIN, TIBC, IRON, RETICCTPCT in the last 72 hours. Sepsis Labs: No results for input(s): PROCALCITON, LATICACIDVEN in the last 168 hours.  No results found for this or any previous visit (from the past 240 hour(s)).       Radiology Studies: Dg Chest 2 View  Result Date: 09/15/2017 CLINICAL DATA:  Headache, dizziness and nausea since a fall 8 months ago. EXAM: CHEST  2 VIEW COMPARISON:  CT chest 04/29/2017. PA and lateral chest 08/24/2017 and 04/28/2017. FINDINGS: Mild linear atelectasis is seen in the lingula and right base. No consolidative process, edema, pneumothorax or effusion. Heart size is  upper normal. Aortic atherosclerosis is seen. Spinal stimulator is in place. No acute bony abnormality. Remote surgical neck fracture left humerus is noted. IMPRESSION: No acute disease. Atherosclerosis. Electronically Signed   By: Inge Rise M.D.   On: 09/15/2017 10:49   Ct Head Wo Contrast  Result Date: 09/15/2017 CLINICAL DATA:  Headache, dizziness. EXAM: CT HEAD WITHOUT CONTRAST TECHNIQUE: Contiguous axial images were obtained from the base of the skull through the vertex without intravenous contrast. COMPARISON:  CT scan of August 22, 2017. FINDINGS: Brain: Mild chronic ischemic white matter disease is noted. No mass effect or midline shift is noted. Ventricular size is within normal limits. There is no evidence of mass lesion, hemorrhage or acute infarction. Vascular: No hyperdense vessel or unexpected calcification. Skull: Normal. Negative for fracture or focal lesion. Sinuses/Orbits:  No acute finding. Other: None. IMPRESSION: Mild chronic ischemic white matter disease. No acute intracranial abnormality seen. Electronically Signed   By: Marijo Conception, M.D.   On: 09/15/2017 11:58   US Carotid Bilateral  Result Date: 09/15/2017 CLINICAL DATA:  72 year old female with syncope EXAM: BILATERAL CAROTID DUPLEX ULTRASOUND TECHNIQUE: Pearline Cables scale imaging, color Doppler and duplex ultrasound were performed of bilateral carotid and vertebral arteries in the neck. COMPARISON:  Prior head CT 09/15/2017 FINDINGS: Criteria: Quantification of carotid stenosis is based on velocity parameters that correlate the residual internal carotid diameter with NASCET-based stenosis levels, using the diameter of the distal internal carotid lumen as the denominator for stenosis measurement. The following velocity measurements were obtained: RIGHT ICA:  81/47 cm/sec CCA:  063/01 cm/sec SYSTOLIC ICA/CCA RATIO:  0.6 DIASTOLIC ICA/CCA RATIO:  1.2 ECA:  66 cm/sec LEFT ICA:  177/33 cm/sec CCA:  60/10 cm/sec SYSTOLIC ICA/CCA RATIO:  1.8 DIASTOLIC ICA/CCA RATIO:  1.4 ECA:  83 cm/sec RIGHT CAROTID ARTERY: Trace heterogeneous atherosclerotic plaque. By peak systolic velocity criteria, the estimated stenosis is less than 50%. RIGHT VERTEBRAL ARTERY:  Patent with normal antegrade flow. LEFT CAROTID ARTERY: Highly tortuous vascular anatomy. No significant atherosclerotic plaque. Spurious elevation of the peak systolic velocity in the tortuous segments. LEFT VERTEBRAL ARTERY: Patent with normal antegrade flow. IMPRESSION: 1. Mild (1-49%) stenosis proximal right internal carotid artery secondary to mild heterogeneous atherosclerotic plaque. 2. No significant atherosclerotic plaque or evidence of tortuosity on the left. Of note, the left internal carotid artery is highly tortuous. 3. Vertebral arteries remain patent with antegrade flow. Signed, Criselda Peaches, MD [facility] Radiology  Electronically Signed   By: Jacqulynn Cadet M.D.   On: 09/15/2017 15:34        Scheduled Meds: . aspirin  81 mg Oral Daily  . atorvastatin  20 mg Oral Daily  . enoxaparin (LOVENOX) injection  40 mg Subcutaneous Q24H  . insulin aspart  0-15 Units Subcutaneous TID WC  . insulin aspart  0-5 Units Subcutaneous QHS  . levothyroxine  50 mcg Oral QAC breakfast  . nystatin   Topical TID  . quinapril  40 mg Oral Daily  . sertraline  100 mg Oral Daily  . sodium chloride flush  3 mL Intravenous Q12H   Continuous Infusions: . sodium chloride 60 mL/hr at 09/16/17 0308  . magnesium sulfate 1 - 4 g bolus IVPB 4 g (09/16/17 1056)     LOS: 0 days    Time spent: 35 mins    Irine Seal, MD [facility] Pager [phone]  If 7PM-7AM, please contact night-coverage [email or web address] Password [facility] 09/16/2017, 11:34 AM

## 2017-09-16 NOTE — Progress Notes (Signed)
*  PRELIMINARY RESULTS* Echocardiogram 2D Echocardiogram has been performed.  Leavy Cella 09/16/2017, 3:47 PM

## 2017-09-16 NOTE — Consult Note (Addendum)
[place] A. Merlene Laughter, MD     [email or web address]          Shirley Leblanc is an 72 y.o. female.   ASSESSMENT/PLAN: 1. The patient semiology is most consistent with orthostatic tremors: This may or may not be associated with orthostatic hypotension. It typically response to clonazepam which we may try low dose given her associated comorbidities. We will check her orthostatics first. If orthostatic,  I will treat this first before starting clonazepam. She has imaging pending which will be obtained and followed.  2. Baseline obstructive sleep apnea syndrome  3. COPD  4. Morbid obesity  5. Multifactorial gait impairment including significant osteoarthritis:  6. Acute posttraumatic headache:  This is a 72 year old white female who apparently had a head injury about 2 months ago. She tells me that she was turning around when she twisted and fell to the ground hitting the left frontal region. She did not loose consciousness but since then she's had persistent headaches and apparently difficulties shaking on standing. She reports symmetric proximal shaking of the thighs on standing. Sometimes upper extremities are also involved. She does occasionally have lightheadedness. She does not report having complete loss of consciousness. She denies oral trauma or associated urinary incontinence. She does have a long-standing history of urine incontinence but these seem not to be necessarily related to these episodic episodes of shaking. She tells me that she is on 24-hour oxygen. She also has a diagnosis of sleep apnea tells me that she's been compliant with this. The review systems otherwise negative.    GENERAL: This is a morbidly obese female in no acute distress. She is on oxygen.  HEENT: Neck is supple and no trauma appreciated this time.  ABDOMEN: soft  EXTREMITIES: She is status post bilateral knee arthroplasties. There is marked osteoarthritic changes of the knees and the  other joints.  BACK: Normal  SKIN: Normal by inspection.    MENTAL STATUS: Alert and oriented. Speech, language and cognition are generally intact. Judgment and insight normal.   CRANIAL NERVES: Pupils are equal, round and reactive to light and accomodation; extra ocular movements are full, there is no significant nystagmus; visual fields are full; upper and lower facial muscles are normal in strength and symmetric, there is no flattening of the nasolabial folds; tongue is midline; uvula is midline; shoulder elevation is normal.  MOTOR: Upper extremity 5/5 with normal bulk and tone. Right lower extremity 4 minus/5 and left lower extremity 5.  COORDINATION: Left finger to nose is normal, right finger to nose is normal, No rest tremor; no intention tremor; no postural tremor; no bradykinesia.  REFLEXES: Deep tendon reflexes are symmetrical and normal in the upper extremities but the left lower extremity is brisk with cross adductors noted. The right patellar response is absent. The right ankle jerk is normal. Plantar reflexes are flexor bilaterally.   SENSATION: Normal to pain.      Blood pressure (!) 152/82, pulse 71, temperature 98.5 F (36.9 C), temperature source Axillary, resp. rate 18, height _0  (1.676 m), weight 261 lb 7.5 oz (118.6 kg), SpO2 97 %.  Past Medical History:  Diagnosis Date  . Bursitis   . CAD (coronary artery disease)    a. nonobstructive CAD with 40% Prox LAD stenosis by cath in 2007  . Depression   . DJD (degenerative joint disease)   . GERD (gastroesophageal reflux disease)   . Hyperlipemia   . Hypertension   . Left wrist fracture   .  Obesities, morbid ([place])   . Open left ankle fracture   . Urinary incontinence     Past Surgical History:  Procedure Laterality Date  . CARDIAC CATHETERIZATION    . CHOLECYSTECTOMY N/A 05/14/2015   Procedure: LAPAROSCOPIC CHOLECYSTECTOMY;  Surgeon: Coralie Keens, MD;  Location: [facility];  Service: General;   Laterality: N/A;  . NECK SURGERY    . TOTAL KNEE ARTHROPLASTY     x2  . VAGINAL HYSTERECTOMY      Family History  Problem Relation Age of Onset  . Lung disease Mother   . Heart attack Father   . Lung disease Unknown   . Heart attack Unknown   . Cancer Daughter        breast    Social History:  reports that  has never smoked. she has never used smokeless tobacco. She reports that she does not drink alcohol or use drugs.  Allergies:  Allergies  Allergen Reactions  . Contrast Media [Iodinated Diagnostic Agents] Anaphylaxis       . Penicillins Hives, Itching and Rash    Has patient had a PCN reaction causing immediate rash, facial/tongue/throat swelling, SOB or lightheadedness with hypotension Yes Has patient had a PCN reaction causing severe rash involving mucus membranes or skin necrosis: Yes Has patient had a PCN reaction that required hospitalization No Has patient had a PCN reaction occurring within the last 10 years: No If all of the above answers are "NO", then may proceed with Cephalosporin use.   . Sulfa Antibiotics Hives, Itching and Rash    Medications: Prior to Admission medications   Medication Sig Start Date End Date Taking? Authorizing Provider  albuterol (PROVENTIL HFA;VENTOLIN HFA) 108 (90 Base) MCG/ACT inhaler TAKE 2 PUFFS BY MOUTH EVERY 6 HOURS AS NEEDED FOR WHEEZE OR SHORTNESS OF BREATH 08/07/17  Yes Hawks, Christy A, FNP  atorvastatin (LIPITOR) 20 MG tablet Take 1 tablet (20 mg total) by mouth daily. Patient taking differently: Take 10 mg by mouth daily.  01/13/17  Yes Hawks, Christy A, FNP  furosemide (LASIX) 20 MG tablet Take 1 tablet (20 mg total) by mouth daily. 05/17/15  Yes Hongalgi, Lenis Dickinson, MD  HYDROcodone-acetaminophen (NORCO) 7.5-325 MG tablet Take 1-2 tablets by mouth every 8 (eight) hours as needed for moderate pain. Patient taking differently: Take 1 tablet by mouth 3 (three) times daily.  07/14/17  Yes Hawks, Christy A, FNP  hydrOXYzine  (ATARAX/VISTARIL) 50 MG tablet Take 1 tablet (50 mg total) by mouth 3 (three) times daily as needed. Patient taking differently: Take 50 mg by mouth 2 (two) times daily.  08/07/17  Yes Hawks, Christy A, FNP  levothyroxine (SYNTHROID, LEVOTHROID) 50 MCG tablet TAKE 1 TABLET (50 MCG TOTAL) BY MOUTH DAILY. 05/04/17  Yes Martin, Mary-Margaret, FNP  quinapril (ACCUPRIL) 40 MG tablet TAKE 1 TABLET (40 MG TOTAL) BY MOUTH DAILY. 04/29/17  Yes Hawks, Christy A, FNP  sertraline (ZOLOFT) 100 MG tablet Take 1 tablet (100 mg total) by mouth daily. 06/23/17 12/20/17 Yes Hawks, Christy A, FNP  aspirin 81 MG EC tablet Take 81 mg by mouth daily.      [provider]  blood glucose meter kit and supplies KIT Dispense per insurance preference. Use up to four times daily . E 11.9 09/30/16   Hawks, Christy A, FNP  Cholecalciferol (VITAMIN D3) 2000 UNITS TABS Take 2,000 Units by mouth daily.     [provider]  glucose blood (GLUCOSE METER TEST) test strip Use BID 09/29/16   Hawks,  Christy A, FNP    Scheduled Meds: . aspirin  81 mg Oral Daily  . atorvastatin  20 mg Oral Daily  . enoxaparin (LOVENOX) injection  40 mg Subcutaneous Q24H  . insulin aspart  0-15 Units Subcutaneous TID WC  . insulin aspart  0-5 Units Subcutaneous QHS  . levothyroxine  50 mcg Oral QAC breakfast  . nystatin   Topical TID  . quinapril  40 mg Oral Daily  . sertraline  100 mg Oral Daily  . sodium chloride flush  3 mL Intravenous Q12H   Continuous Infusions: . sodium chloride 60 mL/hr at 09/16/17 0308  . magnesium sulfate 1 - 4 g bolus IVPB     PRN Meds:.acetaminophen **OR** acetaminophen, alum & mag hydroxide-simeth, HYDROcodone-acetaminophen, hydrOXYzine, ketorolac, ondansetron **OR** ondansetron (ZOFRAN) IV, senna-docusate     Results for orders placed or performed during the hospital encounter of 09/15/17 (from the past 48 hour(s))  CBC with Differential     Status: None   Collection Time: 09/15/17 10:38 AM  Result  Value Ref Range   WBC 7.4 4.0 - 10.5 K/uL   RBC 4.45 3.87 - 5.11 MIL/uL   Hemoglobin 13.0 12.0 - 15.0 g/dL   HCT 41.1 36.0 - 46.0 %   MCV 92.4 78.0 - 100.0 fL   MCH 29.2 26.0 - 34.0 pg   MCHC 31.6 30.0 - 36.0 g/dL   RDW 12.8 11.5 - 15.5 %   Platelets 177 150 - 400 K/uL   Neutrophils Relative % 59 %   Neutro Abs 4.4 1.7 - 7.7 K/uL   Lymphocytes Relative 31 %   Lymphs Abs 2.3 0.7 - 4.0 K/uL   Monocytes Relative 6 %   Monocytes Absolute 0.4 0.1 - 1.0 K/uL   Eosinophils Relative 4 %   Eosinophils Absolute 0.3 0.0 - 0.7 K/uL   Basophils Relative 0 %   Basophils Absolute 0.0 0.0 - 0.1 K/uL    Comment: Performed at [facility], [address], [place], [place] [address]  Comprehensive metabolic panel     Status: Abnormal   Collection Time: 09/15/17 10:38 AM  Result Value Ref Range   Sodium 140 135 - 145 mmol/L   Potassium 4.2 3.5 - 5.1 mmol/L   Chloride 97 (L) 101 - 111 mmol/L   CO2 34 (H) 22 - 32 mmol/L   Glucose, Bld 111 (H) 65 - 99 mg/dL   BUN 13 6 - 20 mg/dL   Creatinine, Ser 0.77 0.44 - 1.00 mg/dL   Calcium 9.6 8.9 - 10.3 mg/dL   Total Protein 7.0 6.5 - 8.1 g/dL   Albumin 3.7 3.5 - 5.0 g/dL   AST 22 15 - 41 U/L   ALT 16 14 - 54 U/L   Alkaline Phosphatase 82 38 - 126 U/L   Total Bilirubin 0.3 0.3 - 1.2 mg/dL   GFR calc non Af Amer >60 >60 mL/min   GFR calc Af Amer >60 >60 mL/min    Comment: (NOTE) The eGFR has been calculated using the CKD EPI equation. This calculation has not been validated in all clinical situations. eGFR's persistently <60 mL/min signify possible Chronic Kidney Disease.    Anion gap 9 5 - 15    Comment: Performed at [facility], [address], [place], [place] [address]  Troponin I     Status: None   Collection Time: 09/15/17 10:38 AM  Result Value Ref Range   Troponin I <0.03 <0.03 ng/mL    Comment: Performed at [facility],  [address], [place], [place] [address]  Magnesium     Status: None   Collection Time: 09/15/17 10:38 AM  Result  Value Ref Range   Magnesium 1.7 1.7 - 2.4 mg/dL    Comment: Performed at [facility], [address], [place], [place] [address]  TSH     Status: None   Collection Time: 09/15/17 10:38 AM  Result Value Ref Range   TSH 3.060 0.350 - 4.500 uIU/mL    Comment: Performed by a 3rd Generation assay with a functional sensitivity of <=0.01 uIU/mL. Performed at [facility], [address] [place], [place] [address]   Troponin I     Status: None   Collection Time: 09/15/17 12:41 PM  Result Value Ref Range   Troponin I <0.03 <0.03 ng/mL    Comment: Performed at [facility], [address] [place], [place] [address]  Hemoglobin A1c     Status: Abnormal   Collection Time: 09/15/17 12:41 PM  Result Value Ref Range   Hgb A1c MFr Bld 5.7 (H) 4.8 - 5.6 %    Comment: (NOTE) Pre diabetes:          5.7%-6.4% Diabetes:              >6.4% Glycemic control for   <7.0% adults with diabetes    Mean Plasma Glucose 116.89 mg/dL    Comment: Performed at [facility] [address], [place], [place] [address]  CBG monitoring, ED     Status: None   Collection Time: 09/15/17  1:10 PM  Result Value Ref Range   Glucose-Capillary 99 65 - 99 mg/dL  CBG monitoring, ED     Status: None   Collection Time: 09/15/17  5:41 PM  Result Value Ref Range   Glucose-Capillary 99 65 - 99 mg/dL  Glucose, capillary     Status: Abnormal   Collection Time: 09/15/17  6:14 PM  Result Value Ref Range   Glucose-Capillary 125 (H) 65 - 99 mg/dL  Troponin I     Status: None   Collection Time: 09/15/17  8:47 PM  Result Value Ref Range   Troponin I <0.03 <0.03 ng/mL    Comment: Performed at [facility] [address] [place], [place] [address]  Glucose, capillary     Status: Abnormal   Collection Time: 09/15/17 10:49 PM  Result Value Ref Range   Glucose-Capillary 114 (H) 65 - 99 mg/dL  Comprehensive metabolic panel     Status: Abnormal   Collection Time: 09/16/17  5:54 AM  Result Value Ref Range   Sodium 138 135 -  145 mmol/L   Potassium 3.6 3.5 - 5.1 mmol/L   Chloride 98 (L) 101 - 111 mmol/L   CO2 31 22 - 32 mmol/L   Glucose, Bld 118 (H) 65 - 99 mg/dL   BUN 16 6 - 20 mg/dL   Creatinine, Ser 0.71 0.44 - 1.00 mg/dL   Calcium 9.4 8.9 - 10.3 mg/dL   Total Protein 6.4 (L) 6.5 - 8.1 g/dL   Albumin 3.4 (L) 3.5 - 5.0 g/dL   AST 19 15 - 41 U/L   ALT 15 14 - 54 U/L   Alkaline Phosphatase 75 38 - 126 U/L   Total Bilirubin 0.6 0.3 - 1.2 mg/dL   GFR calc non Af Amer >60 >60 mL/min   GFR calc Af Amer >60 >60 mL/min    Comment: (NOTE) The eGFR has been calculated using the CKD EPI equation. This calculation has not been  validated in all clinical situations. eGFR's persistently <60 mL/min signify possible Chronic Kidney Disease.    Anion gap 9 5 - 15    Comment: Performed at [facility], [address], [place], [place] [address]  Magnesium     Status: Abnormal   Collection Time: 09/16/17  5:54 AM  Result Value Ref Range   Magnesium 1.6 (L) 1.7 - 2.4 mg/dL    Comment: Performed at [facility], [address], [place], [place] [address]  CBC WITH DIFFERENTIAL     Status: None   Collection Time: 09/16/17  5:54 AM  Result Value Ref Range   WBC 6.2 4.0 - 10.5 K/uL   RBC 4.31 3.87 - 5.11 MIL/uL   Hemoglobin 12.5 12.0 - 15.0 g/dL   HCT 40.0 36.0 - 46.0 %   MCV 92.8 78.0 - 100.0 fL   MCH 29.0 26.0 - 34.0 pg   MCHC 31.3 30.0 - 36.0 g/dL   RDW 12.8 11.5 - 15.5 %   Platelets 163 150 - 400 K/uL   Neutrophils Relative % 49 %   Neutro Abs 3.1 1.7 - 7.7 K/uL   Lymphocytes Relative 41 %   Lymphs Abs 2.5 0.7 - 4.0 K/uL   Monocytes Relative 6 %   Monocytes Absolute 0.4 0.1 - 1.0 K/uL   Eosinophils Relative 4 %   Eosinophils Absolute 0.3 0.0 - 0.7 K/uL   Basophils Relative 0 %   Basophils Absolute 0.0 0.0 - 0.1 K/uL    Comment: Performed at [facility], [address] [place], [place] [address]  Lipid panel     Status: Abnormal   Collection Time: 09/16/17  5:58 AM  Result Value Ref Range   Cholesterol  189 0 - 200 mg/dL   Triglycerides 138 <150 mg/dL   HDL 51 >40 mg/dL   Total CHOL/HDL Ratio 3.7 RATIO   VLDL 28 0 - 40 mg/dL   LDL Cholesterol 110 (H) 0 - 99 mg/dL    Comment:        Total Cholesterol/HDL:CHD Risk Coronary Heart Disease Risk Table                     Men   Women  1/2 Average Risk   3.4   3.3  Average Risk       5.0   4.4  2 X Average Risk   9.6   7.1  3 X Average Risk  23.4   11.0        Use the calculated Patient Ratio above and the CHD Risk Table to determine the patient's CHD Risk.        ATP III CLASSIFICATION (LDL):  <100     mg/dL   Optimal  100-129  mg/dL   Near or Above                    Optimal  130-159  mg/dL   Borderline  160-189  mg/dL   High  >190     mg/dL   Very High Performed at [facility] [place], [place] [address]   Glucose, capillary     Status: Abnormal   Collection Time: 09/16/17  7:34 AM  Result Value Ref Range   Glucose-Capillary 118 (H) 65 - 99 mg/dL    Studies/Results:  EEG Impression:  This is a normal EEG in the awake.  This does not rule out underlying seizures.  If clinical suspicion remains high then an EEG with  sleep deprivation or a more prolonged EEG may be of additional value.   CAROTID DOPPLERS 1. Mild (1-49%) stenosis proximal right internal carotid artery secondary to mild heterogeneous atherosclerotic plaque. 2. No significant atherosclerotic plaque or evidence of tortuosity on the left. Of note, the left internal carotid artery is highly tortuous. 3. Vertebral arteries remain patent with antegrade flow.          [place] G[place] A. Merlene Laughter, M.D.  Diplomate, Tax adviser of Psychiatry and Neurology ( Neurology). 09/16/2017, 9:02 AM

## 2017-09-17 DIAGNOSIS — R251 Tremor, unspecified: Secondary | ICD-10-CM | POA: Diagnosis not present

## 2017-09-17 DIAGNOSIS — E039 Hypothyroidism, unspecified: Secondary | ICD-10-CM | POA: Diagnosis not present

## 2017-09-17 DIAGNOSIS — E1169 Type 2 diabetes mellitus with other specified complication: Secondary | ICD-10-CM | POA: Diagnosis not present

## 2017-09-17 DIAGNOSIS — R55 Syncope and collapse: Secondary | ICD-10-CM | POA: Diagnosis not present

## 2017-09-17 DIAGNOSIS — R270 Ataxia, unspecified: Secondary | ICD-10-CM | POA: Diagnosis not present

## 2017-09-17 DIAGNOSIS — I480 Paroxysmal atrial fibrillation: Secondary | ICD-10-CM | POA: Diagnosis not present

## 2017-09-17 DIAGNOSIS — R4182 Altered mental status, unspecified: Secondary | ICD-10-CM | POA: Diagnosis not present

## 2017-09-17 DIAGNOSIS — E785 Hyperlipidemia, unspecified: Secondary | ICD-10-CM | POA: Diagnosis not present

## 2017-09-17 DIAGNOSIS — F332 Major depressive disorder, recurrent severe without psychotic features: Secondary | ICD-10-CM | POA: Diagnosis not present

## 2017-09-17 LAB — CBC WITH DIFFERENTIAL/PLATELET
BASOS ABS: 0 10*3/uL (ref 0.0–0.1)
BASOS PCT: 0 %
EOS ABS: 0.3 10*3/uL (ref 0.0–0.7)
EOS PCT: 4 %
HCT: 39.2 % (ref 36.0–46.0)
Hemoglobin: 12.2 g/dL (ref 12.0–15.0)
Lymphocytes Relative: 31 %
Lymphs Abs: 1.9 10*3/uL (ref 0.7–4.0)
MCH: 28.9 pg (ref 26.0–34.0)
MCHC: 31.1 g/dL (ref 30.0–36.0)
MCV: 92.9 fL (ref 78.0–100.0)
MONO ABS: 0.4 10*3/uL (ref 0.1–1.0)
MONOS PCT: 7 %
NEUTROS ABS: 3.6 10*3/uL (ref 1.7–7.7)
Neutrophils Relative %: 58 %
PLATELETS: 151 10*3/uL (ref 150–400)
RBC: 4.22 MIL/uL (ref 3.87–5.11)
RDW: 12.9 % (ref 11.5–15.5)
WBC: 6.1 10*3/uL (ref 4.0–10.5)

## 2017-09-17 LAB — COMPREHENSIVE METABOLIC PANEL
ALK PHOS: 69 U/L (ref 38–126)
ALT: 15 U/L (ref 14–54)
AST: 20 U/L (ref 15–41)
Albumin: 3.3 g/dL — ABNORMAL LOW (ref 3.5–5.0)
Anion gap: 8 (ref 5–15)
BILIRUBIN TOTAL: 0.5 mg/dL (ref 0.3–1.2)
BUN: 15 mg/dL (ref 6–20)
CALCIUM: 9 mg/dL (ref 8.9–10.3)
CO2: 31 mmol/L (ref 22–32)
CREATININE: 0.79 mg/dL (ref 0.44–1.00)
Chloride: 97 mmol/L — ABNORMAL LOW (ref 101–111)
GFR calc Af Amer: >60 mL/min (ref >60)
Glucose, Bld: 162 mg/dL — ABNORMAL HIGH (ref 65–99)
POTASSIUM: 3.9 mmol/L (ref 3.5–5.1)
Sodium: 136 mmol/L (ref 135–145)
TOTAL PROTEIN: 6.4 g/dL — AB (ref 6.5–8.1)

## 2017-09-17 LAB — GLUCOSE, CAPILLARY
GLUCOSE-CAPILLARY: 149 mg/dL — AB (ref 65–99)
GLUCOSE-CAPILLARY: 98 mg/dL (ref 65–99)
GLUCOSE-CAPILLARY: 169 mg/dL — AB (ref 65–99)
Glucose-Capillary: 91 mg/dL (ref 65–99)

## 2017-09-17 LAB — MAGNESIUM: MAGNESIUM: 1.9 mg/dL (ref 1.7–2.4)

## 2017-09-17 NOTE — Care Management (Addendum)
Patient suffers from COPD which impairs their ability to perform daily activities like ambulating in the home.  A walker will not resolve  issue with performing activities of daily living. A wheelchair will allow patient to safely perform daily activities. Patient can safely propel the wheelchair in the home or has a caregiver who can provide assistance. Due to patients height/weight ratio, she will require bariatric wheelchair  Patient requires frequent re-positioning of the body in ways that cannot be achieved with an ordinary bed or wedge pillow, to eliminate pain, reduce pressure, and the head of the bed to be elevated more than 30 degrees most of the time due to CHF and COPD

## 2017-09-17 NOTE — Progress Notes (Addendum)
Pt discharged in stable condition via wheelchair into the care of her daughter via private vehicle.  PIV removed intact w/o S&S of complications.  Discharge instructions reviewed with pt. Ice pack sent with pt.  Error in charting. Wrong pt.

## 2017-09-17 NOTE — Progress Notes (Signed)
PROGRESS NOTE    Shirley Leblanc  TZG:[id number] DOB: 12-08-1945 DOA: 09/15/2017 PCP: [name], FNP   Brief Narrative:  Patient is 72 year old female with prior history of a fall presented to the ED with worsening generalized weakness, multiple episodes of near syncopal episodes, confusion, tremors on standing, urinary incontinence with some associated confusion per family.  Patient admitted for further workup.  Concern for possible seizure disorder.  Patient seen in consultation by neurology who feel patient has orthostatic tremors.   Assessment & Plan:   Principal Problem:   Near syncope Active Problems:   Dyspnea on exertion   Hypertension associated with diabetes (HCC)   Atrial fibrillation (HCC)   Chronic anticoagulation - Eliquis, CHADS2VASC=3   Hypothyroid   Hyperlipidemia associated with type 2 diabetes mellitus ([place])   Depression   Diabetes ([place])   Opioid dependence ([place])   Morbid obesity ([place])   Osteopenia   At HIGH RISK for fall   Syncope   CAD (coronary artery disease)   Tremors of nervous system  #1 near syncope/orthostatic tremors Patient presented with increasing falls, near syncopal episodes, shaking on standing, and also noted some urinary incontinence.  Patient and daughter however stated urinary incontinence is chronic in nature and was not associated with symptoms of shaking on standing and near syncopal episodes.  Head CT negative.  MRI unable to be done due to patient's weight.  Discussed with neurology who recommended outpatient MRI.   EEG with no significant findings of seizures.  Carotid Dopplers with no significant ICA stenosis.  2D echo with no source of embolus and normal ejection fraction.  Orthostatics have been checked which are essentially negative.  Patient on IV fluids.  Patient has been seen in consultation by neurology who feel patient's symptoms are likely secondary to orthostatic tremors and I recommended starting low-dose clonazepam at  bedtime.  Patient with some clinical improvement on clonazepam.  Patient asking when the clonazepam dose can be increased to twice daily.  Neurology to reassess.  PT/OT.   2.  Coronary artery disease Asymptomatic.  Stable.  2D echo with EF of 75-91%, grade 1 diastolic dysfunction, no pericardial effusion noted.  Continue cardiac regimen of aspirin, Lipitor, lisinopril.  Diuretics of Lasix on hold.  Follow.  3.  Hypothyroidism TSH was 3.060.  Continue home dose Synthroid.  4.  Morbid obesity  5.  Hypertension Blood pressure currently well controlled.  Continue current regimen of lisinopril.  Follow.  6.  Hyperlipidemia Continue statin.  7.  Atrial fibrillation Currently rate controlled.  Aspirin.  8.  Well controlled diabetes mellitus Hemoglobin A1c 5.7.  CBGs have ranged from 91-149. Continue sliding scale insulin.  9.  Generalized weakness PT/OT.    10.  Depression Currently stable.  Continue Zoloft.   DVT prophylaxis: Lovenox Code Status: DNR Family Communication: Updated daughter at bedside. Disposition Plan: Likely home with home health versus skilled nursing facility when clinically improved and okay per neurology.     Consultants:   Neurology: Dr. Phillips Odor 09/16/2017  Procedures:   CT head 09/15/2017  Chest x-ray 09/15/2017  Carotid ultrasound 09/15/2017  2D echo 09/16/2017  EEG 09/15/2017  Antimicrobials:   None   Subjective: Patient states she is feeling better after being started on Klonopin.  Patient states she is less fidgety.  Some decreased tremors noted per patient and family and nursing.  Patient asking as to whether Klonopin dose could be increased to twice daily.  No chest pain.  No shortness of  breath.  MRI unable to be done due to weight per RN.  Objective: Vitals:   09/17/17 0500 09/17/17 0600 09/17/17 1202 09/17/17 1500  BP:    125/64  Pulse:    70  Resp:    16  Temp:    (!) 97.3 F (36.3 C)  TempSrc:    Oral  SpO2:  100% 95% 98%   Weight: 122.9 kg (270 lb 15.1 oz)     Height:        Intake/Output Summary (Last 24 hours) at 09/17/2017 1845 Last data filed at 09/17/2017 1246 Gross per 24 hour  Intake 480 ml  Output 800 ml  Net -320 ml   Filed Weights   09/16/17 0500 09/16/17 0600 09/17/17 0500  Weight: 118.6 kg (261 lb 7.5 oz) 118.6 kg (261 lb 7.5 oz) 122.9 kg (270 lb 15.1 oz)    Examination:  General exam: NAD Respiratory system: Clear to auscultation bilaterally.  No wheezes, no crackles, no rhonchi.  Respiratory effort normal. Cardiovascular system: RRR. No m/r/g. No JVD, murmurs, rubs, gallops or clicks. No pedal edema. Gastrointestinal system: Abdomen is soft, nontender, nondistended, positive bowel sounds.  Central nervous system: Alert and oriented. No focal neurological deficits. Extremities: Symmetric 5 x 5 power. Skin: No rashes, lesions or ulcers Psychiatry: Judgement and insight appear normal. Mood & affect appropriate.     Data Reviewed: I have personally reviewed following labs and imaging studies  CBC: Recent Labs  Lab 09/15/17 1038 09/16/17 0554 09/17/17 0747  WBC 7.4 6.2 6.1  NEUTROABS 4.4 3.1 3.6  HGB 13.0 12.5 12.2  HCT 41.1 40.0 39.2  MCV 92.4 92.8 92.9  PLT 177 163 315   Basic Metabolic Panel: Recent Labs  Lab 09/15/17 1038 09/16/17 0554 09/17/17 0747  NA 140 138 136  K 4.2 3.6 3.9  CL 97* 98* 97*  CO2 34* 31 31  GLUCOSE 111* 118* 162*  BUN 13 16 15   CREATININE 0.77 0.71 0.79  CALCIUM 9.6 9.4 9.0  MG 1.7 1.6* 1.9   GFR: Estimated Creatinine Clearance: 85 mL/min (by C-G formula based on SCr of 0.79 mg/dL). Liver Function Tests: Recent Labs  Lab 09/15/17 1038 09/16/17 0554 09/17/17 0747  AST 22 19 20   ALT 16 15 15   ALKPHOS 82 75 69  BILITOT 0.3 0.6 0.5  PROT 7.0 6.4* 6.4*  ALBUMIN 3.7 3.4* 3.3*   No results for input(s): LIPASE, AMYLASE in the last 168 hours. No results for input(s): AMMONIA in the last 168 hours. Coagulation Profile: No results for  input(s): INR, PROTIME in the last 168 hours. Cardiac Enzymes: Recent Labs  Lab 09/15/17 1038 09/15/17 1241 09/15/17 2047  TROPONINI <0.03 <0.03 <0.03   BNP (last 3 results) No results for input(s): PROBNP in the last 8760 hours. HbA1C: Recent Labs    09/15/17 1241  HGBA1C 5.7*   CBG: Recent Labs  Lab 09/16/17 1617 09/16/17 2034 09/17/17 0744 09/17/17 1106 09/17/17 1606  GLUCAP 90 127* 149* 98 91   Lipid Profile: Recent Labs    09/16/17 0558  CHOL 189  HDL 51  LDLCALC 110*  TRIG 138  CHOLHDL 3.7   Thyroid Function Tests: Recent Labs    09/15/17 1038  TSH 3.060   Anemia Panel: No results for input(s): VITAMINB12, FOLATE, FERRITIN, TIBC, IRON, RETICCTPCT in the last 72 hours. Sepsis Labs: No results for input(s): PROCALCITON, LATICACIDVEN in the last 168 hours.  No results found for this or any previous visit (from the past 240  hour(s)).       Radiology Studies: No results found.      Scheduled Meds: . aspirin  81 mg Oral Daily  . atorvastatin  20 mg Oral Daily  . clonazePAM  0.5 mg Oral QHS  . enoxaparin (LOVENOX) injection  40 mg Subcutaneous Q24H  . insulin aspart  0-15 Units Subcutaneous TID WC  . insulin aspart  0-5 Units Subcutaneous QHS  . levothyroxine  50 mcg Oral QAC breakfast  . lisinopril  40 mg Oral Daily  . nystatin   Topical TID  . sertraline  100 mg Oral Daily  . sodium chloride flush  3 mL Intravenous Q12H   Continuous Infusions:    LOS: 0 days    Time spent: 35 mins    Irine Seal, MD [facility] Pager [phone]  If 7PM-7AM, please contact night-coverage [email or web address] Password [facility] 09/17/2017, 6:45 PM

## 2017-09-17 NOTE — Clinical Social Work Note (Signed)
Per CM, patient is going home at discharge.   LCSW signing off.

## 2017-09-17 NOTE — Progress Notes (Signed)
Patient refusing orthostatic vital signs tonight, is agreeable to orthostatic vitals in the AM, will check them then.

## 2017-09-17 NOTE — Progress Notes (Signed)
[place] A. Merlene Laughter, MD     [email or web address]          Shirley Leblanc is an 72 y.o. female.   ASSESSMENT/PLAN: 1. The patient semiology is most consistent with orthostatic tremors:  She appears to have a decent response to the clonazepam but wants a dose increase. I'm reluctant to do this at this time and she should like to wait for least 2 weeks or longer. Follow-up brain MRI.  2. Baseline obstructive sleep apnea syndrome  3. COPD  4. Morbid obesity  5. Multifactorial gait impairment including significant osteoarthritis:  6. Acute posttraumatic headache:  7. Restless leg syndrome: Also responded well to clonazepam.    The patient's granddaughters present with her this evening. The report that the nocturnal leg jerking which was constant result  With clonazepam. She also was having discomfort in movements of the abdomen and upper extremities especially the left side. She reports that she had no issues last night and slept well. She also reports that the tremors on standing are better. She thinks that he could be better the dose of the medication was increased but were reluctant at this time. They're eager to have the brain MRI done.     GENERAL: This is a morbidly obese female in no acute distress.  HEENT: Neck is supple and no trauma appreciated this time.  ABDOMEN: soft  EXTREMITIES: She is status post bilateral knee arthroplasties. There is marked osteoarthritic changes of the knees and the other joints.  BACK: Normal  SKIN: Normal by inspection.    MENTAL STATUS: Alert and oriented. Speech, language and cognition are generally intact. Judgment and insight normal.   CRANIAL NERVES: Pupils are equal, round and reactive to light and accomodation; extra ocular movements are full, there is no significant nystagmus; visual fields are full; upper and lower facial muscles are normal in strength and symmetric, there is no flattening of the nasolabial  folds; tongue is midline; uvula is midline; shoulder elevation is normal.  MOTOR: Upper extremity 5/5 with normal bulk and tone. Right lower extremity 4 minus/5 and left lower extremity 5.  COORDINATION: Left finger to nose is normal, right finger to nose is normal, No rest tremor; no intention tremor; no postural tremor; no bradykinesia.  REFLEXES: Deep tendon reflexes are symmetrical and normal in the upper extremities but the left lower extremity is brisk with cross adductors noted. The right patellar response is absent. The right ankle jerk is normal. Plantar reflexes are flexor bilaterally.   SENSATION: Normal to pain.      Blood pressure 125/64, pulse 70, temperature (!) 97.3 F (36.3 C), temperature source Oral, resp. rate 16, height 5' 6"  (1.676 m), weight 270 lb 15.1 oz (122.9 kg), SpO2 98 %.  Past Medical History:  Diagnosis Date  . Bursitis   . CAD (coronary artery disease)    a. nonobstructive CAD with 40% Prox LAD stenosis by cath in 2007  . Depression   . DJD (degenerative joint disease)   . GERD (gastroesophageal reflux disease)   . Hyperlipemia   . Hypertension   . Left wrist fracture   . Obesities, morbid ([place])   . Open left ankle fracture   . Urinary incontinence     Past Surgical History:  Procedure Laterality Date  . CARDIAC CATHETERIZATION    . CHOLECYSTECTOMY N/A 05/14/2015   Procedure: LAPAROSCOPIC CHOLECYSTECTOMY;  Surgeon: Coralie Keens, MD;  Location: [facility];  Service: General;  Laterality: N/A;  . NECK SURGERY    .  TOTAL KNEE ARTHROPLASTY     x2  . VAGINAL HYSTERECTOMY      Family History  Problem Relation Age of Onset  . Lung disease Mother   . Heart attack Father   . Lung disease Unknown   . Heart attack Unknown   . Cancer Daughter        breast    Social History:  reports that  has never smoked. she has never used smokeless tobacco. She reports that she does not drink alcohol or use drugs.  Allergies:  Allergies  Allergen  Reactions  . Contrast Media [Iodinated Diagnostic Agents] Anaphylaxis       . Penicillins Hives, Itching and Rash    Has patient had a PCN reaction causing immediate rash, facial/tongue/throat swelling, SOB or lightheadedness with hypotension Yes Has patient had a PCN reaction causing severe rash involving mucus membranes or skin necrosis: Yes Has patient had a PCN reaction that required hospitalization No Has patient had a PCN reaction occurring within the last 10 years: No If all of the above answers are "NO", then may proceed with Cephalosporin use.   . Sulfa Antibiotics Hives, Itching and Rash    Medications: Prior to Admission medications   Medication Sig Start Date End Date Taking? Authorizing Provider  albuterol (PROVENTIL HFA;VENTOLIN HFA) 108 (90 Base) MCG/ACT inhaler TAKE 2 PUFFS BY MOUTH EVERY 6 HOURS AS NEEDED FOR WHEEZE OR SHORTNESS OF BREATH 08/07/17  Yes Hawks, Christy A, FNP  atorvastatin (LIPITOR) 20 MG tablet Take 1 tablet (20 mg total) by mouth daily. Patient taking differently: Take 10 mg by mouth daily.  01/13/17  Yes Hawks, Christy A, FNP  furosemide (LASIX) 20 MG tablet Take 1 tablet (20 mg total) by mouth daily. 05/17/15  Yes Hongalgi, Lenis Dickinson, MD  HYDROcodone-acetaminophen (NORCO) 7.5-325 MG tablet Take 1-2 tablets by mouth every 8 (eight) hours as needed for moderate pain. Patient taking differently: Take 1 tablet by mouth 3 (three) times daily.  07/14/17  Yes Hawks, Christy A, FNP  hydrOXYzine (ATARAX/VISTARIL) 50 MG tablet Take 1 tablet (50 mg total) by mouth 3 (three) times daily as needed. Patient taking differently: Take 50 mg by mouth 2 (two) times daily.  08/07/17  Yes Hawks, Christy A, FNP  levothyroxine (SYNTHROID, LEVOTHROID) 50 MCG tablet TAKE 1 TABLET (50 MCG TOTAL) BY MOUTH DAILY. 05/04/17  Yes Martin, Mary-Margaret, FNP  quinapril (ACCUPRIL) 40 MG tablet TAKE 1 TABLET (40 MG TOTAL) BY MOUTH DAILY. 04/29/17  Yes Hawks, Christy A, FNP  sertraline (ZOLOFT)  100 MG tablet Take 1 tablet (100 mg total) by mouth daily. 06/23/17 12/20/17 Yes Hawks, Christy A, FNP  aspirin 81 MG EC tablet Take 81 mg by mouth daily.      [provider]  blood glucose meter kit and supplies KIT Dispense per insurance preference. Use up to four times daily . E 11.9 09/30/16   Hawks, Christy A, FNP  Cholecalciferol (VITAMIN D3) 2000 UNITS TABS Take 2,000 Units by mouth daily.     [provider]  glucose blood (GLUCOSE METER TEST) test strip Use BID 09/29/16   Evelina Dun A, FNP    Scheduled Meds: . aspirin  81 mg Oral Daily  . atorvastatin  20 mg Oral Daily  . clonazePAM  0.5 mg Oral QHS  . enoxaparin (LOVENOX) injection  40 mg Subcutaneous Q24H  . insulin aspart  0-15 Units Subcutaneous TID WC  . insulin aspart  0-5 Units Subcutaneous QHS  . levothyroxine  50  mcg Oral QAC breakfast  . lisinopril  40 mg Oral Daily  . nystatin   Topical TID  . sertraline  100 mg Oral Daily  . sodium chloride flush  3 mL Intravenous Q12H   Continuous Infusions:  PRN Meds:.acetaminophen **OR** acetaminophen, alum & mag hydroxide-simeth, HYDROcodone-acetaminophen, hydrOXYzine, ondansetron **OR** ondansetron (ZOFRAN) IV, senna-docusate     Results for orders placed or performed during the hospital encounter of 09/15/17 (from the past 48 hour(s))  Troponin I     Status: None   Collection Time: 09/15/17  8:47 PM  Result Value Ref Range   Troponin I <0.03 <0.03 ng/mL    Comment: Performed at [facility], [address], [place], [place] [address]  Glucose, capillary     Status: Abnormal   Collection Time: 09/15/17 10:49 PM  Result Value Ref Range   Glucose-Capillary 114 (H) 65 - 99 mg/dL  Comprehensive metabolic panel     Status: Abnormal   Collection Time: 09/16/17  5:54 AM  Result Value Ref Range   Sodium 138 135 - 145 mmol/L   Potassium 3.6 3.5 - 5.1 mmol/L   Chloride 98 (L) 101 - 111 mmol/L   CO2 31 22 - 32 mmol/L   Glucose, Bld 118 (H) 65 - 99 mg/dL    BUN 16 6 - 20 mg/dL   Creatinine, Ser 0.71 0.44 - 1.00 mg/dL   Calcium 9.4 8.9 - 10.3 mg/dL   Total Protein 6.4 (L) 6.5 - 8.1 g/dL   Albumin 3.4 (L) 3.5 - 5.0 g/dL   AST 19 15 - 41 U/L   ALT 15 14 - 54 U/L   Alkaline Phosphatase 75 38 - 126 U/L   Total Bilirubin 0.6 0.3 - 1.2 mg/dL   GFR calc non Af Amer >60 >60 mL/min   GFR calc Af Amer >60 >60 mL/min    Comment: (NOTE) The eGFR has been calculated using the CKD EPI equation. This calculation has not been validated in all clinical situations. eGFR's persistently <60 mL/min signify possible Chronic Kidney Disease.    Anion gap 9 5 - 15    Comment: Performed at [facility], [address], [place], [place] [address]  Magnesium     Status: Abnormal   Collection Time: 09/16/17  5:54 AM  Result Value Ref Range   Magnesium 1.6 (L) 1.7 - 2.4 mg/dL    Comment: Performed at [facility], [address], [place], [place] [address]  CBC WITH DIFFERENTIAL     Status: None   Collection Time: 09/16/17  5:54 AM  Result Value Ref Range   WBC 6.2 4.0 - 10.5 K/uL   RBC 4.31 3.87 - 5.11 MIL/uL   Hemoglobin 12.5 12.0 - 15.0 g/dL   HCT 40.0 36.0 - 46.0 %   MCV 92.8 78.0 - 100.0 fL   MCH 29.0 26.0 - 34.0 pg   MCHC 31.3 30.0 - 36.0 g/dL   RDW 12.8 11.5 - 15.5 %   Platelets 163 150 - 400 K/uL   Neutrophils Relative % 49 %   Neutro Abs 3.1 1.7 - 7.7 K/uL   Lymphocytes Relative 41 %   Lymphs Abs 2.5 0.7 - 4.0 K/uL   Monocytes Relative 6 %   Monocytes Absolute 0.4 0.1 - 1.0 K/uL   Eosinophils Relative 4 %   Eosinophils Absolute 0.3 0.0 - 0.7 K/uL   Basophils Relative 0 %   Basophils Absolute 0.0 0.0 - 0.1 K/uL    Comment: Performed at [facility],  [address], [place], [place] [address]  Lipid panel     Status: Abnormal   Collection Time: 09/16/17  5:58 AM  Result Value Ref Range   Cholesterol 189 0 - 200 mg/dL   Triglycerides 138 <150 mg/dL   HDL 51 >40 mg/dL   Total CHOL/HDL Ratio 3.7 RATIO   VLDL 28 0 - 40 mg/dL   LDL Cholesterol  110 (H) 0 - 99 mg/dL    Comment:        Total Cholesterol/HDL:CHD Risk Coronary Heart Disease Risk Table                     Men   Women  1/2 Average Risk   3.4   3.3  Average Risk       5.0   4.4  2 X Average Risk   9.6   7.1  3 X Average Risk  23.4   11.0        Use the calculated Patient Ratio above and the CHD Risk Table to determine the patient's CHD Risk.        ATP III CLASSIFICATION (LDL):  <100     mg/dL   Optimal  100-129  mg/dL   Near or Above                    Optimal  130-159  mg/dL   Borderline  160-189  mg/dL   High  >190     mg/dL   Very High Performed at [facility] [place], [place] [address]   Glucose, capillary     Status: Abnormal   Collection Time: 09/16/17  7:34 AM  Result Value Ref Range   Glucose-Capillary 118 (H) 65 - 99 mg/dL  Glucose, capillary     Status: Abnormal   Collection Time: 09/16/17 11:04 AM  Result Value Ref Range   Glucose-Capillary 135 (H) 65 - 99 mg/dL  Urinalysis, Routine w reflex microscopic     Status: Abnormal   Collection Time: 09/16/17 11:07 AM  Result Value Ref Range   Color, Urine YELLOW YELLOW   APPearance CLEAR CLEAR   Specific Gravity, Urine 1.021 1.005 - 1.030   pH 6.0 5.0 - 8.0   Glucose, UA NEGATIVE NEGATIVE mg/dL   Hgb urine dipstick NEGATIVE NEGATIVE   Bilirubin Urine NEGATIVE NEGATIVE   Ketones, ur NEGATIVE NEGATIVE mg/dL   Protein, ur NEGATIVE NEGATIVE mg/dL   Nitrite NEGATIVE NEGATIVE   Leukocytes, UA TRACE (A) NEGATIVE   RBC / HPF 0-5 0 - 5 RBC/hpf   WBC, UA 0-5 0 - 5 WBC/hpf   Bacteria, UA NONE SEEN NONE SEEN   Squamous Epithelial / LPF 0-5 (A) NONE SEEN   Ca Oxalate Crys, UA PRESENT     Comment: Performed at [facility], [address] [place], [place] [address]  Glucose, capillary     Status: None   Collection Time: 09/16/17  4:17 PM  Result Value Ref Range   Glucose-Capillary 90 65 - 99 mg/dL  Glucose, capillary     Status: Abnormal   Collection Time: 09/16/17  8:34 PM  Result  Value Ref Range   Glucose-Capillary 127 (H) 65 - 99 mg/dL  Glucose, capillary     Status: Abnormal   Collection Time: 09/17/17  7:44 AM  Result Value Ref Range   Glucose-Capillary 149 (H) 65 - 99 mg/dL  Comprehensive metabolic panel     Status: Abnormal   Collection Time: 09/17/17  7:47  AM  Result Value Ref Range   Sodium 136 135 - 145 mmol/L   Potassium 3.9 3.5 - 5.1 mmol/L   Chloride 97 (L) 101 - 111 mmol/L   CO2 31 22 - 32 mmol/L   Glucose, Bld 162 (H) 65 - 99 mg/dL   BUN 15 6 - 20 mg/dL   Creatinine, Ser 0.79 0.44 - 1.00 mg/dL   Calcium 9.0 8.9 - 10.3 mg/dL   Total Protein 6.4 (L) 6.5 - 8.1 g/dL   Albumin 3.3 (L) 3.5 - 5.0 g/dL   AST 20 15 - 41 U/L   ALT 15 14 - 54 U/L   Alkaline Phosphatase 69 38 - 126 U/L   Total Bilirubin 0.5 0.3 - 1.2 mg/dL   GFR calc non Af Amer >60 >60 mL/min   GFR calc Af Amer >60 >60 mL/min    Comment: (NOTE) The eGFR has been calculated using the CKD EPI equation. This calculation has not been validated in all clinical situations. eGFR's persistently <60 mL/min signify possible Chronic Kidney Disease.    Anion gap 8 5 - 15    Comment: Performed at [facility], [address], [place], [place] [address]  Magnesium     Status: None   Collection Time: 09/17/17  7:47 AM  Result Value Ref Range   Magnesium 1.9 1.7 - 2.4 mg/dL    Comment: Performed at [facility], [address], [place], [place] [address]  CBC WITH DIFFERENTIAL     Status: None   Collection Time: 09/17/17  7:47 AM  Result Value Ref Range   WBC 6.1 4.0 - 10.5 K/uL   RBC 4.22 3.87 - 5.11 MIL/uL   Hemoglobin 12.2 12.0 - 15.0 g/dL   HCT 39.2 36.0 - 46.0 %   MCV 92.9 78.0 - 100.0 fL   MCH 28.9 26.0 - 34.0 pg   MCHC 31.1 30.0 - 36.0 g/dL   RDW 12.9 11.5 - 15.5 %   Platelets 151 150 - 400 K/uL   Neutrophils Relative % 58 %   Neutro Abs 3.6 1.7 - 7.7 K/uL   Lymphocytes Relative 31 %   Lymphs Abs 1.9 0.7 - 4.0 K/uL   Monocytes Relative 7 %   Monocytes Absolute 0.4 0.1 - 1.0 K/uL     Eosinophils Relative 4 %   Eosinophils Absolute 0.3 0.0 - 0.7 K/uL   Basophils Relative 0 %   Basophils Absolute 0.0 0.0 - 0.1 K/uL    Comment: Performed at [facility], [address], [place], [place] [address]  Glucose, capillary     Status: None   Collection Time: 09/17/17 11:06 AM  Result Value Ref Range   Glucose-Capillary 98 65 - 99 mg/dL  Glucose, capillary     Status: None   Collection Time: 09/17/17  4:06 PM  Result Value Ref Range   Glucose-Capillary 91 65 - 99 mg/dL    Studies/Results:  EEG Impression:  This is a normal EEG in the awake.  This does not rule out underlying seizures.  If clinical suspicion remains high then an EEG with sleep deprivation or a more prolonged EEG may be of additional value.   CAROTID DOPPLERS 1. Mild (1-49%) stenosis proximal right internal carotid artery secondary to mild heterogeneous atherosclerotic plaque. 2. No significant atherosclerotic plaque or evidence of tortuosity on the left. Of note, the left internal carotid artery is highly tortuous. 3. Vertebral arteries remain patent with antegrade flow.          Yaire Kreher  A. Merlene Laughter, M.D.  Diplomate, Tax adviser of Psychiatry and Neurology ( Neurology). 09/17/2017, 8:34 PM

## 2017-09-17 NOTE — Care Management Note (Signed)
Case Management Note  Patient Details  Name: Shirley Leblanc MRN: [id number] Date of Birth: 1946/01/01  Expected Discharge Date:  09/17/17               Expected Discharge Plan:  [place]  In-House Referral:  NA  Discharge planning Services  CM Consult  Post Acute Care Choice:  Home Health, Resumption of Svcs/PTA Provider, Durable Medical Equipment Choice offered to:  Patient  DME Arranged:  Wheelchair manual, Hospital bed DME Agency:  [facility]:  RN, PT, Social Work, OT [facility] Agency:  [facility]  Status of Service:  Completed, signed off   Additional Comments: Admitted with syncope. Pt ready for DC. PT has recommended SNF. Pt in obs and wishes to go home. She is active with [facility]. She has home oxygen and RW pta through [facility]. She needs hospital bed and WC, would like those from [facility] also. Blake Divine, [facility] rep, aware of referral and will pull pt info from chart and deliver WC to room and deliver bed to pt home. Pt's granddaughter Chrissy, at bedside, says they will need a ramp. CM has provided contact info for ramp provider. Chrissy and pt say they can manage at home, will have people to get pt into home at DC (will not need EMS to get into house). [facility] rep, Sharyn Lull, aware of DC plan. DC planned for today.   Sherald Barge, RN 09/17/2017, 12:59 PM

## 2017-09-18 ENCOUNTER — Ambulatory Visit (HOSPITAL_COMMUNITY)
Admit: 2017-09-18 | Discharge: 2017-09-18 | Disposition: A | Discharge status: Home / Self Care | Payer: Medicare Other | Attending: Internal Medicine | Admitting: Internal Medicine

## 2017-09-18 DIAGNOSIS — R42 Dizziness and giddiness: Secondary | ICD-10-CM | POA: Diagnosis not present

## 2017-09-18 DIAGNOSIS — I480 Paroxysmal atrial fibrillation: Secondary | ICD-10-CM | POA: Diagnosis not present

## 2017-09-18 DIAGNOSIS — M1990 Unspecified osteoarthritis, unspecified site: Secondary | ICD-10-CM | POA: Diagnosis present

## 2017-09-18 DIAGNOSIS — Z66 Do not resuscitate: Secondary | ICD-10-CM | POA: Diagnosis present

## 2017-09-18 DIAGNOSIS — E1169 Type 2 diabetes mellitus with other specified complication: Secondary | ICD-10-CM | POA: Diagnosis present

## 2017-09-18 DIAGNOSIS — R55 Syncope and collapse: Secondary | ICD-10-CM | POA: Diagnosis not present

## 2017-09-18 DIAGNOSIS — F332 Major depressive disorder, recurrent severe without psychotic features: Secondary | ICD-10-CM | POA: Diagnosis not present

## 2017-09-18 DIAGNOSIS — G44309 Post-traumatic headache, unspecified, not intractable: Secondary | ICD-10-CM | POA: Diagnosis present

## 2017-09-18 DIAGNOSIS — I4891 Unspecified atrial fibrillation: Secondary | ICD-10-CM | POA: Diagnosis present

## 2017-09-18 DIAGNOSIS — F1120 Opioid dependence, uncomplicated: Secondary | ICD-10-CM | POA: Diagnosis present

## 2017-09-18 DIAGNOSIS — R251 Tremor, unspecified: Secondary | ICD-10-CM | POA: Diagnosis not present

## 2017-09-18 DIAGNOSIS — R269 Unspecified abnormalities of gait and mobility: Secondary | ICD-10-CM | POA: Diagnosis present

## 2017-09-18 DIAGNOSIS — G4733 Obstructive sleep apnea (adult) (pediatric): Secondary | ICD-10-CM | POA: Diagnosis present

## 2017-09-18 DIAGNOSIS — R32 Unspecified urinary incontinence: Secondary | ICD-10-CM | POA: Diagnosis present

## 2017-09-18 DIAGNOSIS — F329 Major depressive disorder, single episode, unspecified: Secondary | ICD-10-CM | POA: Diagnosis present

## 2017-09-18 DIAGNOSIS — I6521 Occlusion and stenosis of right carotid artery: Secondary | ICD-10-CM | POA: Diagnosis present

## 2017-09-18 DIAGNOSIS — I152 Hypertension secondary to endocrine disorders: Secondary | ICD-10-CM | POA: Diagnosis present

## 2017-09-18 DIAGNOSIS — J449 Chronic obstructive pulmonary disease, unspecified: Secondary | ICD-10-CM | POA: Diagnosis present

## 2017-09-18 DIAGNOSIS — I2510 Atherosclerotic heart disease of native coronary artery without angina pectoris: Secondary | ICD-10-CM | POA: Diagnosis present

## 2017-09-18 DIAGNOSIS — E039 Hypothyroidism, unspecified: Secondary | ICD-10-CM | POA: Diagnosis not present

## 2017-09-18 DIAGNOSIS — E785 Hyperlipidemia, unspecified: Secondary | ICD-10-CM | POA: Diagnosis present

## 2017-09-18 DIAGNOSIS — K219 Gastro-esophageal reflux disease without esophagitis: Secondary | ICD-10-CM | POA: Diagnosis present

## 2017-09-18 DIAGNOSIS — G9340 Encephalopathy, unspecified: Secondary | ICD-10-CM

## 2017-09-18 DIAGNOSIS — E1165 Type 2 diabetes mellitus with hyperglycemia: Secondary | ICD-10-CM | POA: Diagnosis not present

## 2017-09-18 DIAGNOSIS — G44319 Acute post-traumatic headache, not intractable: Secondary | ICD-10-CM | POA: Diagnosis present

## 2017-09-18 DIAGNOSIS — G252 Other specified forms of tremor: Secondary | ICD-10-CM | POA: Diagnosis present

## 2017-09-18 DIAGNOSIS — Z6841 Body Mass Index (BMI) 40.0 and over, adult: Secondary | ICD-10-CM | POA: Diagnosis not present

## 2017-09-18 DIAGNOSIS — M8580 Other specified disorders of bone density and structure, unspecified site: Secondary | ICD-10-CM | POA: Diagnosis present

## 2017-09-18 LAB — URINE CULTURE

## 2017-09-18 LAB — COMPREHENSIVE METABOLIC PANEL
ALK PHOS: 71 U/L (ref 38–126)
ALT: 15 U/L (ref 14–54)
ANION GAP: 8 (ref 5–15)
AST: 16 U/L (ref 15–41)
Albumin: 3.3 g/dL — ABNORMAL LOW (ref 3.5–5.0)
BILIRUBIN TOTAL: 0.2 mg/dL — AB (ref 0.3–1.2)
BUN: 16 mg/dL (ref 6–20)
CALCIUM: 9.3 mg/dL (ref 8.9–10.3)
CO2: 31 mmol/L (ref 22–32)
Chloride: 99 mmol/L — ABNORMAL LOW (ref 101–111)
Creatinine, Ser: 0.78 mg/dL (ref 0.44–1.00)
GFR calc Af Amer: >60 mL/min (ref >60)
Glucose, Bld: 113 mg/dL — ABNORMAL HIGH (ref 65–99)
POTASSIUM: 4 mmol/L (ref 3.5–5.1)
Sodium: 138 mmol/L (ref 135–145)
TOTAL PROTEIN: 6.3 g/dL — AB (ref 6.5–8.1)

## 2017-09-18 LAB — CBC WITH DIFFERENTIAL/PLATELET
BASOS ABS: 0 10*3/uL (ref 0.0–0.1)
BASOS PCT: 0 %
EOS ABS: 0.3 10*3/uL (ref 0.0–0.7)
Eosinophils Relative: 4 %
HEMATOCRIT: 40 % (ref 36.0–46.0)
Hemoglobin: 12.7 g/dL (ref 12.0–15.0)
Lymphocytes Relative: 34 %
Lymphs Abs: 2.3 10*3/uL (ref 0.7–4.0)
MCH: 29.7 pg (ref 26.0–34.0)
MCHC: 31.8 g/dL (ref 30.0–36.0)
MCV: 93.7 fL (ref 78.0–100.0)
MONOS PCT: 8 %
Monocytes Absolute: 0.5 10*3/uL (ref 0.1–1.0)
NEUTROS ABS: 3.6 10*3/uL (ref 1.7–7.7)
Neutrophils Relative %: 54 %
Platelets: 168 10*3/uL (ref 150–400)
RBC: 4.27 MIL/uL (ref 3.87–5.11)
RDW: 13.1 % (ref 11.5–15.5)
WBC: 6.8 10*3/uL (ref 4.0–10.5)

## 2017-09-18 LAB — GLUCOSE, CAPILLARY
GLUCOSE-CAPILLARY: 119 mg/dL — AB (ref 65–99)
GLUCOSE-CAPILLARY: 117 mg/dL — AB (ref 65–99)
Glucose-Capillary: 139 mg/dL — ABNORMAL HIGH (ref 65–99)
Glucose-Capillary: 154 mg/dL — ABNORMAL HIGH (ref 65–99)

## 2017-09-18 LAB — MAGNESIUM: MAGNESIUM: 1.8 mg/dL (ref 1.7–2.4)

## 2017-09-18 MED ORDER — MAGNESIUM SULFATE 2 GM/50ML IV SOLN
2.0000 g | Freq: Once | INTRAVENOUS | Status: AC
  Administered 2017-09-18: 2 g via INTRAVENOUS
  Filled 2017-09-18: qty 50

## 2017-09-18 MED ORDER — FUROSEMIDE 20 MG PO TABS
20.0000 mg | ORAL_TABLET | Freq: Every day | ORAL | Status: DC
  Administered 2017-09-18 – 2017-09-19 (×2): 20 mg via ORAL
  Filled 2017-09-18 (×2): qty 1

## 2017-09-18 NOTE — Progress Notes (Signed)
Physical Therapy Treatment Patient Details Name: Shirley Leblanc MRN: [id number] DOB: July 12, 1946 Today's Date: 09/18/2017    History of Present Illness  Shirley Leblanc is a 72 y.o. female with multiple medical problems as detailed below the presents to the emergency department because of multiple progressive episodes of syncope and confusion.  According to the daughter, patient hit her head about 8 months ago and since that time she is had multiple episodes of loss of consciousness. The episodes only last 1-2 minutes and initially they were very rare but they continue to become more frequent.  They are associated with loss of bladder control and she has about 30 minutes of confusion after the event.  She is getting worked up by her primary care doctor and has a referral to a neurologist but the patient didn't want to wait and came to ED.  No full body shaking with these episodes.  The problem is the patient is with her daughter and it is hard to keep control of her when these things happen.  No history of seizures.  No history of passing out before this.  She has had a neurology appointment today but another episode happened this morning the daughter cannot hold her up.  She is been get physical therapy for generalized weakness at home does not seem to be improving from that standpoint.  No other associated or modifying symptoms.      PT Comments    SEssion focus on BLE strength and A/ROM in bed, with improved pain response, but most activity requires min-maxA for limb movement. Transfers at bedside tolerated well from elevated surface and RW use on room air SpO2: 99%. Pt performs STS 5x with PRN rest between effort. Pt making good progress overall. Of note: standard WC in room although case manager requesting wider size: [facility] telling patient to try out this size before trying the larger one.    Follow Up Recommendations  SNF;Supervision/Assistance - 24 hour     Equipment Recommendations  None  recommended by PT    Recommendations for Other Services       Precautions / Restrictions Precautions Precautions: Fall Precaution Comments: fall on 09/11/17  Restrictions Weight Bearing Restrictions: No    Mobility  Bed Mobility Overal bed mobility: Needs Assistance       Supine to sit: Min assist Sit to supine: Min assist   General bed mobility comments: has to use siderail for supine to sit, HHA from PT   Transfers Overall transfer level: Needs assistance Equipment used: Rolling walker (2 wheeled) Transfers: Sit to/from Stand Sit to Stand: Supervision         General transfer comment: performed 5x with PRN rest, VC for stepping Rt to center of bed each time. Appears stable.   Ambulation/Gait Ambulation/Gait assistance: (not appropriate at this time)               Financial trader Rankin (Stroke Patients Only)       Balance Overall balance assessment: Needs assistance Sitting-balance support: No upper extremity supported;Feet supported Sitting balance-Leahy Scale: Good     Standing balance support: Bilateral upper extremity supported;During functional activity Standing balance-Leahy Scale: Fair                              Cognition Arousal/Alertness: Awake/alert Behavior During Therapy: WFL for tasks assessed/performed Overall  Cognitive Status: Within Functional Limits for tasks assessed                                        Exercises Other Exercises Other Exercises: Supine Bilat heel slides: 1x15 (modA); Supine Bilat abduction/adduciton heel slides: 1x15 (modA) Other Exercises: hooklying Bilat marching: 1x15 (maxA); Supine Bilat SAQ: 1x15 (minA) Other Exercises: Supine Manually resisted minisquat/legpress: 1x10 bilat    General Comments        Pertinent Vitals/Pain Pain Assessment: 0-10 Pain Score: 8  Pain Location: 8/10 low back, 2/10 bilat knees Pain Descriptors /  Indicators: Aching;Constant Pain Intervention(s): Limited activity within patient's tolerance;Monitored during session;Premedicated before session;Repositioned;Other (comment)(pain improved after a session)    Home Living                      Prior Function            PT Goals (current goals can now be found in the care plan section) Acute Rehab PT Goals Patient Stated Goal: return home with grandaughter to assist Time For Goal Achievement: 09/30/17 Potential to Achieve Goals: Fair Progress towards PT goals: Progressing toward goals    Frequency    Min 3X/week      PT Plan      Co-evaluation              AM-PAC PT "6 Clicks" Daily Activity  Outcome Measure  Difficulty turning over in bed (including adjusting bedclothes, sheets and blankets)?: A Lot Difficulty moving from lying on back to sitting on the side of the bed? : Unable Difficulty sitting down on and standing up from a chair with arms (e.g., wheelchair, bedside commode, etc,.)?: A Lot Help needed moving to and from a bed to chair (including a wheelchair)?: A Lot Help needed walking in hospital room?: Total Help needed climbing 3-5 steps with a railing? : Total 6 Click Score: 9    End of Session Equipment Utilized During Treatment: Oxygen Activity Tolerance: Patient tolerated treatment well;Patient limited by fatigue;No increased pain Patient left: in bed;with call bell/phone within reach Nurse Communication: Mobility status PT Visit Diagnosis: Unsteadiness on feet (R26.81);Other abnormalities of gait and mobility (R26.89);Muscle weakness (generalized) (M62.81)     Time: 0100-7121 PT Time Calculation (min) (ACUTE ONLY): 34 min  Charges:  $Therapeutic Exercise: 8-22 mins $Therapeutic Activity: 8-22 mins                    G Codes:       10:31 AM, 2017-09-21 Etta Grandchild, PT, DPT Physical Therapist - [place] [phone] (Office)    Diallo Ponder  C 09/21/17, 10:29 AM

## 2017-09-18 NOTE — Progress Notes (Signed)
Pt came to [facility] MRI Department via [facility].  Pt came with a hand held device with model number [id number].  I call [employer] to ask how we put her Med[employer]onic device not  MRI mode and the Service Tech with [employer] said the device [id number] was the charger for the unit and not the controller that could switch to MRI Mode.  Service Tech said Pt was issued a controller with the model number [id number] that would switch to MRI mode.  Pt did not have that controller with her.  Pt told me to contact her grand daughter Alyse Low to see if she had the other controller.  Christy did not have other controller and stated that pt was only issued the one controller.  Contacted RN [name] at [facility] and Dr. Grandville Silos to make them both aware. Pt transported back to [facility] via [facility].

## 2017-09-18 NOTE — Progress Notes (Signed)
PT received from [place]. NO s/s of distress

## 2017-09-18 NOTE — Progress Notes (Signed)
PROGRESS NOTE    ELIANNAH Leblanc  MPN:[id number] DOB: 07-06-1946 DOA: 09/15/2017 PCP: [name], FNP   Brief Narrative:  Patient is 72 year old female with prior history of a fall presented to the ED with worsening generalized weakness, multiple episodes of near syncopal episodes, confusion, tremors on standing, urinary incontinence with some associated confusion per family.  Patient admitted for further workup.  Concern for possible seizure disorder.  Patient seen in consultation by neurology who feel patient has orthostatic tremors.   Assessment & Plan:   Principal Problem:   Near syncope Active Problems:   Dyspnea on exertion   Hypertension associated with diabetes (HCC)   Atrial fibrillation (HCC)   Chronic anticoagulation - Eliquis, CHADS2VASC=3   Hypothyroid   Hyperlipidemia associated with type 2 diabetes mellitus ([place])   Depression   Diabetes ([place])   Opioid dependence ([place])   Morbid obesity ([place])   Osteopenia   At HIGH RISK for fall   Syncope   CAD (coronary artery disease)   Tremors of nervous system   Tremor  #1 near syncope/orthostatic tremors Patient presented with increasing falls, near syncopal episodes, shaking on standing, and also noted some urinary incontinence.  Patient and daughter however stated urinary incontinence is chronic in nature and was not associated with symptoms of shaking on standing and near syncopal episodes.  Head CT negative.  MRI unable to be done due to patient's weight.  Family anxious to have MRI done during this hospitalization and neurology in agreement and as such we will reorder MRI brain to be done at [facility] today hopefully.   EEG with no significant findings of seizures.  Carotid Dopplers with no significant ICA stenosis.  2D echo with no source of embolus and normal ejection fraction.  Orthostatics have been checked which are essentially negative.  Patient on IV fluids which will be discontinued..  Patient has been  seen in consultation by neurology who feel patient's symptoms are likely secondary to orthostatic tremors and recommended starting low-dose clonazepam at bedtime.  Patient with some clinical improvement on clonazepam.  Patient asking when the clonazepam dose can be increased to twice daily however neurology feels reluctant at this time and will titrate slowly over the next few weeks.  Neurology following and appreciate their input and recommendations. PT/OT.   2.  Coronary artery disease Asymptomatic.  Stable.  2D echo with EF of 00-86%, grade 1 diastolic dysfunction, no pericardial effusion noted.  Continue cardiac regimen of aspirin, Lipitor, lisinopril.  Resume home regimen of Lasix. Follow.  3.  Hypothyroidism TSH was 3.060.  Continue home dose Synthroid.  4.  Morbid obesity  5.  Hypertension Blood pressure well controlled on current regimen of lisinopril.  Home dose Lasix to be resumed monitor blood pressure.  Follow.    6.  Hyperlipidemia Continue statin.  7.  Atrial fibrillation Currently rate controlled.  Aspirin.  8.  Well controlled diabetes mellitus Hemoglobin A1c 5.7.  CBGs have ranged from 119-169. Continue sliding scale insulin.  9.  Generalized weakness PT/OT.    10.  Depression Continue current regimen of Zoloft.    DVT prophylaxis: Lovenox Code Status: DNR Family Communication: Updated daughter at bedside. Disposition Plan: Likely home with home health versus skilled nursing facility when clinically improved and after MRI and okay per neurology.     Consultants:   Neurology: Dr. Phillips Odor 09/16/2017  Procedures:   CT head 09/15/2017  Chest x-ray 09/15/2017  Carotid ultrasound 09/15/2017  2D echo 09/16/2017  EEG 09/15/2017  MRI pending    Antimicrobials:   None   Subjective: Patient states continued improvement with tremors on standing after being started on clonazepam.  No chest pain.  No shortness of breath.  Patient states spoke with  neurology yesterday and decision was made to have MRI done during this hospitalization.  Family anxious to have MRI done.   Objective: Vitals:   09/17/17 2052 09/17/17 2300 09/18/17 0500 09/18/17 0600  BP:    139/76  Pulse:    72  Resp: 18   16  Temp:  98.7 F (37.1 C)  98.2 F (36.8 C)  TempSrc: Oral   Oral  SpO2: 99%   99%  Weight:   124.5 kg (274 lb 7.6 oz)   Height:        Intake/Output Summary (Last 24 hours) at 09/18/2017 0932 Last data filed at 09/18/2017 0600 Gross per 24 hour  Intake 243 ml  Output 850 ml  Net -607 ml   Filed Weights   09/16/17 0600 09/17/17 0500 09/18/17 0500  Weight: 118.6 kg (261 lb 7.5 oz) 122.9 kg (270 lb 15.1 oz) 124.5 kg (274 lb 7.6 oz)    Examination:  General exam: NAD Respiratory system: Lungs CTAB. No wheezes, no crackles, no rhonchi.  Respiratory effort normal. Cardiovascular system: Regular rate and rhythm no murmurs rubs or gallops.  No JVD.  No lower extremity pitting edema Gastrointestinal system: Abdomen is nontender, nondistended, soft, positive bowel sounds.  No rebound.  No guarding.  Central nervous system: Alert and oriented x3.  Moving extremities spontaneously. No focal neurological deficits. Extremities: Symmetric 5 x 5 power. Skin: No rashes, lesions or ulcers Psychiatry: Judgement and insight appear normal. Mood & affect appropriate.     Data Reviewed: I have personally reviewed following labs and imaging studies  CBC: Recent Labs  Lab 09/15/17 1038 09/16/17 0554 09/17/17 0747 09/18/17 0525  WBC 7.4 6.2 6.1 6.8  NEUTROABS 4.4 3.1 3.6 3.6  HGB 13.0 12.5 12.2 12.7  HCT 41.1 40.0 39.2 40.0  MCV 92.4 92.8 92.9 93.7  PLT 177 163 151 235   Basic Metabolic Panel: Recent Labs  Lab 09/15/17 1038 09/16/17 0554 09/17/17 0747 09/18/17 0525  NA 140 138 136 138  K 4.2 3.6 3.9 4.0  CL 97* 98* 97* 99*  CO2 34* 31 31 31   GLUCOSE 111* 118* 162* 113*  BUN 13 16 15 16   CREATININE 0.77 0.71 0.79 0.78  CALCIUM 9.6  9.4 9.0 9.3  MG 1.7 1.6* 1.9 1.8   GFR: Estimated Creatinine Clearance: 85.7 mL/min (by C-G formula based on SCr of 0.78 mg/dL). Liver Function Tests: Recent Labs  Lab 09/15/17 1038 09/16/17 0554 09/17/17 0747 09/18/17 0525  AST 22 19 20 16   ALT 16 15 15 15   ALKPHOS 82 75 69 71  BILITOT 0.3 0.6 0.5 0.2*  PROT 7.0 6.4* 6.4* 6.3*  ALBUMIN 3.7 3.4* 3.3* 3.3*   No results for input(s): LIPASE, AMYLASE in the last 168 hours. No results for input(s): AMMONIA in the last 168 hours. Coagulation Profile: No results for input(s): INR, PROTIME in the last 168 hours. Cardiac Enzymes: Recent Labs  Lab 09/15/17 1038 09/15/17 1241 09/15/17 2047  TROPONINI <0.03 <0.03 <0.03   BNP (last 3 results) No results for input(s): PROBNP in the last 8760 hours. HbA1C: Recent Labs    09/15/17 1241  HGBA1C 5.7*   CBG: Recent Labs  Lab 09/17/17 0744 09/17/17 1106 09/17/17 1606 09/17/17 2141 09/18/17 0731  GLUCAP 149* 98 91 169* 119*   Lipid Profile: Recent Labs    09/16/17 0558  CHOL 189  HDL 51  LDLCALC 110*  TRIG 138  CHOLHDL 3.7   Thyroid Function Tests: Recent Labs    09/15/17 1038  TSH 3.060   Anemia Panel: No results for input(s): VITAMINB12, FOLATE, FERRITIN, TIBC, IRON, RETICCTPCT in the last 72 hours. Sepsis Labs: No results for input(s): PROCALCITON, LATICACIDVEN in the last 168 hours.  Recent Results (from the past 240 hour(s))  Culture, Urine     Status: Abnormal   Collection Time: 09/16/17 11:07 AM  Result Value Ref Range Status   Specimen Description   Final    URINE, CLEAN CATCH Performed at [facility], [address], [place], [place] [address]    Special Requests   Final    NONE Performed at [facility], [address], [place], [place] [address]    Culture MULTIPLE SPECIES PRESENT, SUGGEST RECOLLECTION (A)  Final   Report Status 09/18/2017 FINAL  Final         Radiology Studies: No results found.      Scheduled Meds: . aspirin   81 mg Oral Daily  . atorvastatin  20 mg Oral Daily  . clonazePAM  0.5 mg Oral QHS  . enoxaparin (LOVENOX) injection  40 mg Subcutaneous Q24H  . insulin aspart  0-15 Units Subcutaneous TID WC  . insulin aspart  0-5 Units Subcutaneous QHS  . levothyroxine  50 mcg Oral QAC breakfast  . lisinopril  40 mg Oral Daily  . nystatin   Topical TID  . sertraline  100 mg Oral Daily  . sodium chloride flush  3 mL Intravenous Q12H   Continuous Infusions: . magnesium sulfate 1 - 4 g bolus IVPB       LOS: 0 days    Time spent: 35 mins    Irine Seal, MD [facility] Pager [phone]  If 7PM-7AM, please contact night-coverage [email or web address] Password [facility] 09/18/2017, 9:32 AM

## 2017-09-19 DIAGNOSIS — R42 Dizziness and giddiness: Secondary | ICD-10-CM

## 2017-09-19 LAB — COMPREHENSIVE METABOLIC PANEL
ALBUMIN: 3.4 g/dL — AB (ref 3.5–5.0)
ALT: 14 U/L (ref 14–54)
AST: 17 U/L (ref 15–41)
Alkaline Phosphatase: 74 U/L (ref 38–126)
Anion gap: 10 (ref 5–15)
BUN: 14 mg/dL (ref 6–20)
CHLORIDE: 97 mmol/L — AB (ref 101–111)
CO2: 31 mmol/L (ref 22–32)
CREATININE: 0.76 mg/dL (ref 0.44–1.00)
Calcium: 9.7 mg/dL (ref 8.9–10.3)
GFR calc Af Amer: >60 mL/min (ref >60)
GFR calc non Af Amer: >60 mL/min (ref >60)
GLUCOSE: 121 mg/dL — AB (ref 65–99)
POTASSIUM: 4.1 mmol/L (ref 3.5–5.1)
SODIUM: 138 mmol/L (ref 135–145)
Total Bilirubin: 0.7 mg/dL (ref 0.3–1.2)
Total Protein: 6.6 g/dL (ref 6.5–8.1)

## 2017-09-19 LAB — GLUCOSE, CAPILLARY
Glucose-Capillary: 120 mg/dL — ABNORMAL HIGH (ref 65–99)
Glucose-Capillary: 123 mg/dL — ABNORMAL HIGH (ref 65–99)

## 2017-09-19 MED ORDER — NYSTATIN 100000 UNIT/GM EX POWD: Freq: Three times a day (TID) | CUTANEOUS | 0 refills | Status: AC

## 2017-09-19 MED ORDER — CLONAZEPAM 0.5 MG PO TABS: 0.5000 mg | ORAL_TABLET | Freq: Every day | ORAL | 0 refills | Status: DC

## 2017-09-19 NOTE — Progress Notes (Signed)
Discharge instructions reviewed with patient and grand-daughter at bedside. Given copy of AVS and prescriptions. Pt verbalized understanding of instructions, follow-up with PCP and neurology, and when to seek medical attention. IV site removed, site within normal limits. Home medical equipment scheduled to be delivered today between 1200-1600 per [facility] rep. Grand-daughter states they are aware and will be home. Home health to resume via [facility], arranged by Case management. Pt in stable condition, left floor via w/c accompanied by nursing staff. Donavan Foil, RN

## 2017-09-19 NOTE — Discharge Summary (Signed)
Physician Discharge Summary  Shirley Leblanc WLN:[id number] DOB: 1945/08/05 DOA: 09/15/2017  PCP: Shirley Balloon, FNP  Admit date: 09/15/2017 Discharge date: 09/19/2017  Time spent: 55 minutes  Recommendations for Outpatient Follow-up:  1. Follow-up with Shirley Leblanc, neurology in 1 month. 2. Follow-up with Shirley Balloon, FNP in 2 weeks.  On follow-up patient will need a basic metabolic profile done to follow-up on electrolytes and renal function.   Discharge Diagnoses:  Principal Problem:   Near syncope Active Problems:   Dyspnea on exertion   Hypertension associated with diabetes (HCC)   Atrial fibrillation (HCC)   Chronic anticoagulation - Eliquis, CHADS2VASC=3   Hypothyroid   Hyperlipidemia associated with type 2 diabetes mellitus (HCC)   Depression   Diabetes (HCC)   Opioid dependence ([place])   Morbid obesity ([place])   Osteopenia   At HIGH RISK for fall   Syncope   CAD (coronary artery disease)   Tremors of nervous system   Tremor   Encephalopathy   Discharge Condition: Stable and improved  Diet recommendation: Carb modified diet  Filed Weights   09/17/17 0500 09/18/17 0500 09/19/17 0432  Weight: 122.9 kg (270 lb 15.1 oz) 124.5 kg (274 lb 7.6 oz) 121.1 kg (266 lb 15.6 oz)    History of present illness:  Per Shirley Leblanc is a 72 y.o. female with multiple medical problems as detailed below the presented to the emergency department because of multiple progressive episodes ofsyncope andconfusion. According to the daughter, patient hit her head about 8 months ago and since that time she is had multiple episodes of loss of consciousness. The episodes only last 1-2 minutes and initially they were very rare but they continue to become more frequent.  They are associated with loss of bladder control and she has about 30 minutes of confusion after the event. She is getting worked up by her primary care doctor and has a referral to a neurologist but the patient  didn't want to wait and came to ED. No full body shaking with these episodes. The problem is the patient is with her daughter and it is hard to keep control of her when these things happen. No history of seizures. No history of passing out before this. She has had a neurology appointment today but another episode happened this morning the daughter cannot hold her up. She had been gettjng physical therapy for generalized weakness at home does not seem to be improving from that standpoint.  No other associated or modifying symptoms.   ED Course: Neurology was consulted and recommended EEG.  CT head was negative.  MRI pending.  Pt does have a spinal stimulator but says that she has had MRI testing in the past with the stimulator in place.      Hospital Course:  #1 near syncope/orthostatic tremors Patient presented with increasing falls, near syncopal episodes, shaking on standing, and also noted some urinary incontinence.  Patient and daughter however stated urinary incontinence is chronic in nature and was not associated with symptoms of shaking on standing and near syncopal episodes.  Head CT negative.  MRI unable to be done due to patient's weight.  Family anxious to have MRI done during this hospitalization and neurology in agreement and as such MRI was reordered, patient was taken to [facility] however family did not have the remote control to put spinal stimulator in MRI moved and as such MRI was not done and patient brought back to any pain.  EEG with no significant findings of seizures.  Carotid Dopplers with no significant ICA stenosis.  2D echo with no source of embolus and normal ejection fraction.  Orthostatics were checked which were essentially negative.  Patient was hydrated with IV fluids which was subsequently discontinued.   Patient was seen in consultation by neurology who felt patient's symptoms are likely secondary to orthostatic tremors and recommended starting low-dose  clonazepam at bedtime.  Patient improved on clonazepam.  Patient asked wether the clonazepam dose could be increased to twice daily however neurology felt reluctant at this time and will titrate slowly over the next few weeks.  Patient will be discharged home in stable and improved condition and will follow up with neurology in the outpatient setting.  Patient was discharged home with home health services.   2.  Coronary artery disease Remained asymptomatic.  2D echo with EF of 62-83%, grade 1 diastolic dysfunction, no pericardial effusion noted.  Was Continued on cardiac regimen of aspirin, Lipitor, lisinopril.  Lasix was resumed during the hospitalization.  3.  Hypothyroidism TSH was 3.060. Maintained on home dose Synthroid.  4.  Morbid obesity  5.  Hypertension Blood pressure was well controlled during her hospitalization.  Lasix was initially held and patient maintained on her home regimen of lisinopril.  Home dose Lasix was subsequently resumed prior to discharge.  Outpatient follow-up.  6.  Hyperlipidemia Placed on statin.   7.  Atrial fibrillation And rate controlled throughout the hospitalization.  Patient maintained on home regimen of aspirin.   8.  Well controlled diabetes mellitus Hemoglobin A1c 5.7.    Patient was maintained on sliding scale insulin throughout the hospitalization and CBGs were well controlled.   9.  Generalized weakness PT/OT.    10.  Depression She was maintained on Zoloft throughout the hospitalization.  Patient did not have any suicidal or homicidal ideations.  Outpatient follow-up.      Procedures:  CT head 09/15/2017  Chest x-ray 09/15/2017  Carotid ultrasound 09/15/2017  2D echo 09/16/2017  EEG 09/15/2017      Consultations:  Neurology: Shirley Leblanc 09/16/2017   Discharge Exam: Vitals:   09/19/17 0432 09/19/17 0857  BP: 103/70 (!) 117/42  Pulse: 83 85  Resp: 18 16  Temp: 97.9 F (36.6 C) 97.7 F (36.5 C)  SpO2: 95%  94%    General: NAD Cardiovascular: RRR Respiratory: CTAB  Discharge Instructions   Discharge Instructions    Diet Carb Modified   Complete by:  As directed    Increase activity slowly   Complete by:  As directed      Allergies as of 09/19/2017      Reactions   Contrast Media [iodinated Diagnostic Agents] Anaphylaxis      Penicillins Hives, Itching, Rash   Has patient had a PCN reaction causing immediate rash, facial/tongue/throat swelling, SOB or lightheadedness with hypotension Yes Has patient had a PCN reaction causing severe rash involving mucus membranes or skin necrosis: Yes Has patient had a PCN reaction that required hospitalization No Has patient had a PCN reaction occurring within the last 10 years: No If all of the above answers are "NO", then may proceed with Cephalosporin use.   Sulfa Antibiotics Hives, Itching, Rash      Medication List    TAKE these medications   albuterol 108 (90 Base) MCG/ACT inhaler Commonly known as:  PROVENTIL HFA;VENTOLIN HFA TAKE 2 PUFFS BY MOUTH EVERY 6 HOURS AS NEEDED FOR WHEEZE OR SHORTNESS OF BREATH   aspirin  81 MG EC tablet Take 81 mg by mouth daily.   atorvastatin 20 MG tablet Commonly known as:  LIPITOR Take 1 tablet (20 mg total) by mouth daily. What changed:  how much to take   blood glucose meter kit and supplies Kit Dispense per insurance preference. Use up to four times daily . E 11.9   clonazePAM 0.5 MG tablet Commonly known as:  KLONOPIN Take 1 tablet (0.5 mg total) by mouth at bedtime.   furosemide 20 MG tablet Commonly known as:  LASIX Take 1 tablet (20 mg total) by mouth daily.   glucose blood test strip Commonly known as:  GLUCOSE METER TEST Use BID   HYDROcodone-acetaminophen 7.5-325 MG tablet Commonly known as:  NORCO Take 1-2 tablets by mouth every 8 (eight) hours as needed for moderate pain. What changed:    how much to take  when to take this   hydrOXYzine 50 MG tablet Commonly known as:   ATARAX/VISTARIL Take 1 tablet (50 mg total) by mouth 3 (three) times daily as needed. What changed:  when to take this   levothyroxine 50 MCG tablet Commonly known as:  SYNTHROID, LEVOTHROID TAKE 1 TABLET (50 MCG TOTAL) BY MOUTH DAILY.   nystatin powder Commonly known as:  MYCOSTATIN/NYSTOP Apply topically 3 (three) times daily for 5 days. Apply to under breasts   quinapril 40 MG tablet Commonly known as:  ACCUPRIL TAKE 1 TABLET (40 MG TOTAL) BY MOUTH DAILY.   sertraline 100 MG tablet Commonly known as:  ZOLOFT Take 1 tablet (100 mg total) by mouth daily.   Vitamin D3 2000 units Tabs Take 2,000 Units by mouth daily.            Durable Medical Equipment  (From admission, onward)        Start     Ordered   09/17/17 1244  For home use only DME Bedside commode  Once    Question Answer Comment  Patient needs a bedside commode to treat with the following condition Debility   Patient needs a bedside commode to treat with the following condition Tremors of nervous system      09/17/17 1244   09/17/17 1142  For home use only DME Hospital bed  Once    Question Answer Comment  Patient has (list medical condition): COPD   The above medical condition requires: Patient requires the ability to reposition frequently   Head must be elevated greater than: 30 degrees   Bed type Semi-electric      09/17/17 1143   09/17/17 1141  For home use only DME standard manual wheelchair with seat cushion  Once    Comments:  Patient suffers from COPD which impairs their ability to perform daily activities like ambulating in the home.  A walker will not resolve  issue with performing activities of daily living. A wheelchair will allow patient to safely perform daily activities. Patient can safely propel the wheelchair in the home or has a caregiver who can provide assistance.  Accessories: elevating leg rests (ELRs), wheel locks, extensions and anti-tippers.   09/17/17 1143     Allergies   Allergen Reactions  . Contrast Media [Iodinated Diagnostic Agents] Anaphylaxis       . Penicillins Hives, Itching and Rash    Has patient had a PCN reaction causing immediate rash, facial/tongue/throat swelling, SOB or lightheadedness with hypotension Yes Has patient had a PCN reaction causing severe rash involving mucus membranes or skin necrosis: Yes Has patient had a PCN  reaction that required hospitalization No Has patient had a PCN reaction occurring within the last 10 years: No If all of the above answers are "NO", then may proceed with Cephalosporin use.   . Sulfa Antibiotics Hives, Itching and Rash   Follow-up Information    Phillips Odor, MD Follow up in 1 month(s).   Specialty:  Neurology Why:  Call office to schedule follow-up appoinment as instructed by doctor. Call them with any questions/concerns.  Contact information: [address] [place] [address] [phone]        Shirley Balloon, FNP. Schedule an appointment as soon as possible for a visit in 2 week(s).   Specialty:  Family Medicine Why:  f/u in 2 weeks. Contact information: [place] [address] [phone]            The results of significant diagnostics from this hospitalization (including imaging, microbiology, ancillary and laboratory) are listed below for reference.    Significant Diagnostic Studies: Dg Chest 2 View  Result Date: 09/15/2017 CLINICAL DATA:  Headache, dizziness and nausea since a fall 8 months ago. EXAM: CHEST  2 VIEW COMPARISON:  CT chest 04/29/2017. PA and lateral chest 08/24/2017 and 04/28/2017. FINDINGS: Mild linear atelectasis is seen in the lingula and right base. No consolidative process, edema, pneumothorax or effusion. Heart size is upper normal. Aortic atherosclerosis is seen. Spinal stimulator is in place. No acute bony abnormality. Remote surgical neck fracture left humerus is noted. IMPRESSION: No acute disease. Atherosclerosis.  Electronically Signed   By: Inge Rise M.D.   On: 09/15/2017 10:49   Dg Chest 2 View  Result Date: 08/22/2017 CLINICAL DATA:  Patient with foul-smelling urine. EXAM: CHEST  2 VIEW COMPARISON:  Chest CT 04/29/2017; chest radiograph 04/28/2017 FINDINGS: Monitoring leads overlie the patient. Stable cardiomegaly. No consolidative pulmonary opacities. Bibasilar atelectasis. No pleural effusion or pneumothorax. Anterior cervical spinal fusion hardware. Thoracic spinal stimulator device. Thoracic spine degenerative changes. IMPRESSION: No acute cardiopulmonary process. Cardiomegaly. Basilar atelectasis. Electronically Signed   By: Lovey Newcomer M.D.   On: 08/22/2017 20:24   Ct Head Wo Contrast  Result Date: 09/15/2017 CLINICAL DATA:  Headache, dizziness. EXAM: CT HEAD WITHOUT CONTRAST TECHNIQUE: Contiguous axial images were obtained from the base of the skull through the vertex without intravenous contrast. COMPARISON:  CT scan of August 22, 2017. FINDINGS: Brain: Mild chronic ischemic white matter disease is noted. No mass effect or midline shift is noted. Ventricular size is within normal limits. There is no evidence of mass lesion, hemorrhage or acute infarction. Vascular: No hyperdense vessel or unexpected calcification. Skull: Normal. Negative for fracture or focal lesion. Sinuses/Orbits: No acute finding. Other: None. IMPRESSION: Mild chronic ischemic white matter disease. No acute intracranial abnormality seen. Electronically Signed   By: Marijo Conception, M.D.   On: 09/15/2017 11:58   Ct Head Wo Contrast  Result Date: 08/22/2017 CLINICAL DATA:  Altered level of consciousness. Confusion. Weakness. No reported injury. EXAM: CT HEAD WITHOUT CONTRAST TECHNIQUE: Contiguous axial images were obtained from the base of the skull through the vertex without intravenous contrast. COMPARISON:  04/28/2017 head CT. FINDINGS: Brain: No evidence of parenchymal hemorrhage or extra-axial fluid collection. No mass  lesion, mass effect, or midline shift. No CT evidence of acute infarction. Nonspecific mild subcortical and periventricular white matter hypodensity, most in keeping with chronic small vessel ischemic change. Cerebral volume is age appropriate. No ventriculomegaly. Vascular: No acute abnormality. Skull: No evidence of calvarial fracture. Sinuses/Orbits: The visualized  paranasal sinuses are essentially clear. Other:  The mastoid air cells are unopacified. IMPRESSION: 1.  No evidence of acute intracranial abnormality. 2. Mild chronic small vessel ischemic changes in the cerebral white matter. Electronically Signed   By: Ilona Sorrel M.D.   On: 08/22/2017 20:35   US Carotid Bilateral  Result Date: 09/15/2017 CLINICAL DATA:  72 year old female with syncope EXAM: BILATERAL CAROTID DUPLEX ULTRASOUND TECHNIQUE: Pearline Cables scale imaging, color Doppler and duplex ultrasound were performed of bilateral carotid and vertebral arteries in the neck. COMPARISON:  Prior head CT 09/15/2017 FINDINGS: Criteria: Quantification of carotid stenosis is based on velocity parameters that correlate the residual internal carotid diameter with NASCET-based stenosis levels, using the diameter of the distal internal carotid lumen as the denominator for stenosis measurement. The following velocity measurements were obtained: RIGHT ICA:  81/47 cm/sec CCA:  633/35 cm/sec SYSTOLIC ICA/CCA RATIO:  0.6 DIASTOLIC ICA/CCA RATIO:  1.2 ECA:  66 cm/sec LEFT ICA:  177/33 cm/sec CCA:  45/62 cm/sec SYSTOLIC ICA/CCA RATIO:  1.8 DIASTOLIC ICA/CCA RATIO:  1.4 ECA:  83 cm/sec RIGHT CAROTID ARTERY: Trace heterogeneous atherosclerotic plaque. By peak systolic velocity criteria, the estimated stenosis is less than 50%. RIGHT VERTEBRAL ARTERY:  Patent with normal antegrade flow. LEFT CAROTID ARTERY: Highly tortuous vascular anatomy. No significant atherosclerotic plaque. Spurious elevation of the peak systolic velocity in the tortuous segments. LEFT VERTEBRAL ARTERY:  Patent with normal antegrade flow. IMPRESSION: 1. Mild (1-49%) stenosis proximal right internal carotid artery secondary to mild heterogeneous atherosclerotic plaque. 2. No significant atherosclerotic plaque or evidence of tortuosity on the left. Of note, the left internal carotid artery is highly tortuous. 3. Vertebral arteries remain patent with antegrade flow. Signed, Criselda Peaches, MD [facility] Radiology Electronically Signed   By: Jacqulynn Cadet M.D.   On: 09/15/2017 15:34    Microbiology: Recent Results (from the past 240 hour(s))  Culture, Urine     Status: Abnormal   Collection Time: 09/16/17 11:07 AM  Result Value Ref Range Status   Specimen Description   Final    URINE, CLEAN CATCH Performed at [facility], [address], [place], [place] [address]    Special Requests   Final    NONE Performed at [facility], [address] [place], [place] [address]    Culture MULTIPLE SPECIES PRESENT, SUGGEST RECOLLECTION (A)  Final   Report Status 09/18/2017 FINAL  Final     Labs: Basic Metabolic Panel: Recent Labs  Lab 09/15/17 1038 09/16/17 0554 09/17/17 0747 09/18/17 0525 09/19/17 0634  NA 140 138 136 138 138  K 4.2 3.6 3.9 4.0 4.1  CL 97* 98* 97* 99* 97*  CO2 34* _0 GLUCOSE 111* 118* 162* 113* 121*  BUN _1 CREATININE 0.77 0.71 0.79 0.78 0.76  CALCIUM 9.6 9.4 9.0 9.3 9.7  MG 1.7 1.6* 1.9 1.8  --    Liver Function Tests: Recent Labs  Lab 09/15/17 1038 09/16/17 0554 09/17/17 0747 09/18/17 0525 09/19/17 0634  AST _2 ALT _3 ALKPHOS 82 75 69 71 74  BILITOT 0.3 0.6 0.5 0.2* 0.7  PROT 7.0 6.4* 6.4* 6.3* 6.6  ALBUMIN 3.7 3.4* 3.3* 3.3* 3.4*   No results for input(s): LIPASE, AMYLASE in the last 168 hours. No results for input(s): AMMONIA in the last 168 hours. CBC: Recent Labs  Lab 09/15/17 1038 09/16/17 0554 09/17/17 0747 09/18/17 8768  WBC 7.4 6.2 6.1  6.8  NEUTROABS 4.4 3.1 3.6 3.6  HGB 13.0 12.5 12.2 12.7  HCT 41.1 40.0 39.2 40.0  MCV 92.4 92.8 92.9 93.7  PLT 177 163 151 168   Cardiac Enzymes: Recent Labs  Lab 09/15/17 1038 09/15/17 1241 09/15/17 2047  TROPONINI <0.03 <0.03 <0.03   BNP: BNP (last 3 results) Recent Labs    04/28/17 1210  BNP 38.6    ProBNP (last 3 results) No results for input(s): PROBNP in the last 8760 hours.  CBG: Recent Labs  Lab 09/18/17 1143 09/18/17 1623 09/18/17 2059 09/19/17 0745 09/19/17 1148  GLUCAP 139* 154* 117* 120* 123*       Signed:  Irine Seal MD.  [facility] 09/19/2017, 12:47 PM

## 2017-09-24 DIAGNOSIS — M545 Low back pain: Secondary | ICD-10-CM | POA: Diagnosis not present

## 2017-09-24 DIAGNOSIS — I10 Essential (primary) hypertension: Secondary | ICD-10-CM | POA: Diagnosis not present

## 2017-09-24 DIAGNOSIS — R2681 Unsteadiness on feet: Secondary | ICD-10-CM | POA: Diagnosis not present

## 2017-09-24 DIAGNOSIS — M6281 Muscle weakness (generalized): Secondary | ICD-10-CM | POA: Diagnosis not present

## 2017-09-24 DIAGNOSIS — I482 Chronic atrial fibrillation: Secondary | ICD-10-CM | POA: Diagnosis not present

## 2017-09-24 DIAGNOSIS — F0390 Unspecified dementia without behavioral disturbance: Secondary | ICD-10-CM | POA: Diagnosis not present

## 2017-09-28 DIAGNOSIS — R2681 Unsteadiness on feet: Secondary | ICD-10-CM | POA: Diagnosis not present

## 2017-09-28 DIAGNOSIS — F0390 Unspecified dementia without behavioral disturbance: Secondary | ICD-10-CM | POA: Diagnosis not present

## 2017-09-28 DIAGNOSIS — M545 Low back pain: Secondary | ICD-10-CM | POA: Diagnosis not present

## 2017-09-28 DIAGNOSIS — I10 Essential (primary) hypertension: Secondary | ICD-10-CM | POA: Diagnosis not present

## 2017-09-28 DIAGNOSIS — M6281 Muscle weakness (generalized): Secondary | ICD-10-CM | POA: Diagnosis not present

## 2017-09-28 DIAGNOSIS — I482 Chronic atrial fibrillation: Secondary | ICD-10-CM | POA: Diagnosis not present

## 2017-09-30 DIAGNOSIS — R2681 Unsteadiness on feet: Secondary | ICD-10-CM | POA: Diagnosis not present

## 2017-09-30 DIAGNOSIS — I482 Chronic atrial fibrillation: Secondary | ICD-10-CM | POA: Diagnosis not present

## 2017-09-30 DIAGNOSIS — M545 Low back pain: Secondary | ICD-10-CM | POA: Diagnosis not present

## 2017-09-30 DIAGNOSIS — M6281 Muscle weakness (generalized): Secondary | ICD-10-CM | POA: Diagnosis not present

## 2017-09-30 DIAGNOSIS — F0390 Unspecified dementia without behavioral disturbance: Secondary | ICD-10-CM | POA: Diagnosis not present

## 2017-09-30 DIAGNOSIS — I10 Essential (primary) hypertension: Secondary | ICD-10-CM | POA: Diagnosis not present

## 2017-10-01 DIAGNOSIS — M545 Low back pain: Secondary | ICD-10-CM | POA: Diagnosis not present

## 2017-10-01 DIAGNOSIS — M6281 Muscle weakness (generalized): Secondary | ICD-10-CM | POA: Diagnosis not present

## 2017-10-01 DIAGNOSIS — I482 Chronic atrial fibrillation: Secondary | ICD-10-CM | POA: Diagnosis not present

## 2017-10-01 DIAGNOSIS — I10 Essential (primary) hypertension: Secondary | ICD-10-CM | POA: Diagnosis not present

## 2017-10-01 DIAGNOSIS — R2681 Unsteadiness on feet: Secondary | ICD-10-CM | POA: Diagnosis not present

## 2017-10-01 DIAGNOSIS — F0390 Unspecified dementia without behavioral disturbance: Secondary | ICD-10-CM | POA: Diagnosis not present

## 2017-10-02 DIAGNOSIS — M545 Low back pain: Secondary | ICD-10-CM | POA: Diagnosis not present

## 2017-10-02 DIAGNOSIS — M6281 Muscle weakness (generalized): Secondary | ICD-10-CM | POA: Diagnosis not present

## 2017-10-02 DIAGNOSIS — F0390 Unspecified dementia without behavioral disturbance: Secondary | ICD-10-CM | POA: Diagnosis not present

## 2017-10-02 DIAGNOSIS — I10 Essential (primary) hypertension: Secondary | ICD-10-CM | POA: Diagnosis not present

## 2017-10-02 DIAGNOSIS — R2681 Unsteadiness on feet: Secondary | ICD-10-CM | POA: Diagnosis not present

## 2017-10-02 DIAGNOSIS — I482 Chronic atrial fibrillation: Secondary | ICD-10-CM | POA: Diagnosis not present

## 2017-10-06 DIAGNOSIS — R2681 Unsteadiness on feet: Secondary | ICD-10-CM | POA: Diagnosis not present

## 2017-10-06 DIAGNOSIS — M545 Low back pain: Secondary | ICD-10-CM | POA: Diagnosis not present

## 2017-10-06 DIAGNOSIS — I482 Chronic atrial fibrillation: Secondary | ICD-10-CM | POA: Diagnosis not present

## 2017-10-06 DIAGNOSIS — M6281 Muscle weakness (generalized): Secondary | ICD-10-CM | POA: Diagnosis not present

## 2017-10-06 DIAGNOSIS — F0390 Unspecified dementia without behavioral disturbance: Secondary | ICD-10-CM | POA: Diagnosis not present

## 2017-10-06 DIAGNOSIS — I10 Essential (primary) hypertension: Secondary | ICD-10-CM | POA: Diagnosis not present

## 2017-10-07 ENCOUNTER — Encounter: Payer: Self-pay | Admitting: Family

## 2017-10-07 ENCOUNTER — Telehealth: Payer: Self-pay | Admitting: *Deleted

## 2017-10-07 DIAGNOSIS — R0609 Other forms of dyspnea: Secondary | ICD-10-CM

## 2017-10-07 DIAGNOSIS — M545 Low back pain, unspecified: Secondary | ICD-10-CM

## 2017-10-07 DIAGNOSIS — R296 Repeated falls: Secondary | ICD-10-CM

## 2017-10-07 DIAGNOSIS — R0600 Dyspnea, unspecified: Secondary | ICD-10-CM

## 2017-10-07 DIAGNOSIS — G8929 Other chronic pain: Secondary | ICD-10-CM

## 2017-10-07 NOTE — Telephone Encounter (Signed)
Has patient seen a neurologists? I know sh has had a negative CT scan.

## 2017-10-07 NOTE — Telephone Encounter (Signed)
VM received Patient has had several falls, bruises Pedal pulses weak, feet blue O2 went down to 78 on 2L bumped up to 3L came back up to 90 yTuesday [facility] got O2 at 98 on 2L BS 132 Next appt is next Tuesday

## 2017-10-08 NOTE — Addendum Note (Signed)
Addended by: Evelina Dun A on: 10/08/2017 01:26 PM   Modules accepted: Orders

## 2017-10-08 NOTE — Telephone Encounter (Signed)
Neurologist has been seen.   Patient is completely bedridden and EMS has come out five times.  Please do a referral for hospice.   [phone].

## 2017-10-08 NOTE — Telephone Encounter (Signed)
Hospice referral placed.

## 2017-10-09 ENCOUNTER — Telehealth: Payer: Self-pay | Admitting: *Deleted

## 2017-10-09 DIAGNOSIS — R42 Dizziness and giddiness: Secondary | ICD-10-CM | POA: Diagnosis not present

## 2017-10-09 DIAGNOSIS — I519 Heart disease, unspecified: Secondary | ICD-10-CM | POA: Diagnosis not present

## 2017-10-09 DIAGNOSIS — R531 Weakness: Secondary | ICD-10-CM | POA: Diagnosis not present

## 2017-10-09 DIAGNOSIS — E785 Hyperlipidemia, unspecified: Secondary | ICD-10-CM | POA: Diagnosis not present

## 2017-10-09 DIAGNOSIS — F330 Major depressive disorder, recurrent, mild: Secondary | ICD-10-CM | POA: Diagnosis not present

## 2017-10-09 DIAGNOSIS — R52 Pain, unspecified: Secondary | ICD-10-CM | POA: Diagnosis not present

## 2017-10-09 DIAGNOSIS — I10 Essential (primary) hypertension: Secondary | ICD-10-CM | POA: Diagnosis not present

## 2017-10-09 DIAGNOSIS — I4891 Unspecified atrial fibrillation: Secondary | ICD-10-CM | POA: Diagnosis not present

## 2017-10-09 DIAGNOSIS — R0602 Shortness of breath: Secondary | ICD-10-CM | POA: Diagnosis not present

## 2017-10-09 DIAGNOSIS — E039 Hypothyroidism, unspecified: Secondary | ICD-10-CM | POA: Diagnosis not present

## 2017-10-09 NOTE — Telephone Encounter (Signed)
Please forward to [facility] and she can decide for tomorrow

## 2017-10-09 NOTE — Telephone Encounter (Signed)
Pt admitted to Hospice today She is quite nervous Wanting to know if Klonopin Rx can be increased from [facility] Please advise Rx to [place] with note to pharmacy 'Hospice patient - Delivery'

## 2017-10-12 ENCOUNTER — Ambulatory Visit: Payer: Medicare Other | Admitting: Family

## 2017-10-12 DIAGNOSIS — I519 Heart disease, unspecified: Secondary | ICD-10-CM | POA: Diagnosis not present

## 2017-10-12 DIAGNOSIS — F330 Major depressive disorder, recurrent, mild: Secondary | ICD-10-CM | POA: Diagnosis not present

## 2017-10-12 DIAGNOSIS — E039 Hypothyroidism, unspecified: Secondary | ICD-10-CM | POA: Diagnosis not present

## 2017-10-12 DIAGNOSIS — I10 Essential (primary) hypertension: Secondary | ICD-10-CM | POA: Diagnosis not present

## 2017-10-12 DIAGNOSIS — E785 Hyperlipidemia, unspecified: Secondary | ICD-10-CM | POA: Diagnosis not present

## 2017-10-12 DIAGNOSIS — I4891 Unspecified atrial fibrillation: Secondary | ICD-10-CM | POA: Diagnosis not present

## 2017-10-12 MED ORDER — CLONAZEPAM 0.5 MG PO TABS: 0.5000 mg | ORAL_TABLET | Freq: Two times a day (BID) | ORAL | 5 refills | Status: DC | PRN

## 2017-10-12 NOTE — Telephone Encounter (Signed)
Will increase Klonopin to BID from [facility]. Prescription sent to pharmacy.

## 2017-10-13 ENCOUNTER — Telehealth: Payer: Self-pay | Admitting: *Deleted

## 2017-10-13 ENCOUNTER — Ambulatory Visit: Payer: Self-pay | Admitting: Family

## 2017-10-13 MED ORDER — OXYCODONE-ACETAMINOPHEN 7.5-325 MG PO TABS: 1.0000 | ORAL_TABLET | Freq: Three times a day (TID) | ORAL | 0 refills | Status: DC | PRN

## 2017-10-13 NOTE — Telephone Encounter (Signed)
Cancelled rx at [facility]. All Hospice meds go to [facility], please resend

## 2017-10-13 NOTE — Telephone Encounter (Signed)
TC from Hospice Pt is c/o lots of pain Pt is on Hydrocodone-acetaminophen 7.5-325 and is taking 2 at a time Daughter is requesting to increase medication or possible change Please advise and call Manuela Schwartz w/ Hospice back

## 2017-10-13 NOTE — Telephone Encounter (Signed)
Stop Norco and will send in oxycodone  to pharmacy.

## 2017-10-13 NOTE — Addendum Note (Signed)
Addended by: Evelina Dun A on: 10/13/2017 04:33 PM   Modules accepted: Orders

## 2017-10-15 DIAGNOSIS — E785 Hyperlipidemia, unspecified: Secondary | ICD-10-CM | POA: Diagnosis not present

## 2017-10-15 DIAGNOSIS — I10 Essential (primary) hypertension: Secondary | ICD-10-CM | POA: Diagnosis not present

## 2017-10-15 DIAGNOSIS — F330 Major depressive disorder, recurrent, mild: Secondary | ICD-10-CM | POA: Diagnosis not present

## 2017-10-15 DIAGNOSIS — I4891 Unspecified atrial fibrillation: Secondary | ICD-10-CM | POA: Diagnosis not present

## 2017-10-15 DIAGNOSIS — E039 Hypothyroidism, unspecified: Secondary | ICD-10-CM | POA: Diagnosis not present

## 2017-10-15 DIAGNOSIS — I519 Heart disease, unspecified: Secondary | ICD-10-CM | POA: Diagnosis not present

## 2017-10-16 ENCOUNTER — Telehealth: Payer: Self-pay | Admitting: *Deleted

## 2017-10-16 DIAGNOSIS — F330 Major depressive disorder, recurrent, mild: Secondary | ICD-10-CM | POA: Diagnosis not present

## 2017-10-16 DIAGNOSIS — I4891 Unspecified atrial fibrillation: Secondary | ICD-10-CM | POA: Diagnosis not present

## 2017-10-16 DIAGNOSIS — E039 Hypothyroidism, unspecified: Secondary | ICD-10-CM | POA: Diagnosis not present

## 2017-10-16 DIAGNOSIS — I10 Essential (primary) hypertension: Secondary | ICD-10-CM | POA: Diagnosis not present

## 2017-10-16 DIAGNOSIS — E785 Hyperlipidemia, unspecified: Secondary | ICD-10-CM | POA: Diagnosis not present

## 2017-10-16 DIAGNOSIS — I519 Heart disease, unspecified: Secondary | ICD-10-CM | POA: Diagnosis not present

## 2017-10-16 MED ORDER — MECLIZINE HCL 25 MG PO TABS: 25.0000 mg | ORAL_TABLET | Freq: Three times a day (TID) | ORAL | 1 refills | Status: DC | PRN

## 2017-10-16 NOTE — Telephone Encounter (Signed)
TC from Hospice nurse Pt needing refill of Meclizine sent to [facility] Not on current med list Please advise

## 2017-10-16 NOTE — Telephone Encounter (Signed)
Prescription sent to pharmacy.

## 2017-10-20 DIAGNOSIS — I4891 Unspecified atrial fibrillation: Secondary | ICD-10-CM | POA: Diagnosis not present

## 2017-10-20 DIAGNOSIS — E785 Hyperlipidemia, unspecified: Secondary | ICD-10-CM | POA: Diagnosis not present

## 2017-10-20 DIAGNOSIS — F330 Major depressive disorder, recurrent, mild: Secondary | ICD-10-CM | POA: Diagnosis not present

## 2017-10-20 DIAGNOSIS — E039 Hypothyroidism, unspecified: Secondary | ICD-10-CM | POA: Diagnosis not present

## 2017-10-20 DIAGNOSIS — I519 Heart disease, unspecified: Secondary | ICD-10-CM | POA: Diagnosis not present

## 2017-10-20 DIAGNOSIS — I10 Essential (primary) hypertension: Secondary | ICD-10-CM | POA: Diagnosis not present

## 2017-10-22 DIAGNOSIS — E039 Hypothyroidism, unspecified: Secondary | ICD-10-CM | POA: Diagnosis not present

## 2017-10-22 DIAGNOSIS — I4891 Unspecified atrial fibrillation: Secondary | ICD-10-CM | POA: Diagnosis not present

## 2017-10-22 DIAGNOSIS — I519 Heart disease, unspecified: Secondary | ICD-10-CM | POA: Diagnosis not present

## 2017-10-22 DIAGNOSIS — E785 Hyperlipidemia, unspecified: Secondary | ICD-10-CM | POA: Diagnosis not present

## 2017-10-22 DIAGNOSIS — I10 Essential (primary) hypertension: Secondary | ICD-10-CM | POA: Diagnosis not present

## 2017-10-22 DIAGNOSIS — F330 Major depressive disorder, recurrent, mild: Secondary | ICD-10-CM | POA: Diagnosis not present

## 2017-10-23 ENCOUNTER — Telehealth: Payer: Self-pay

## 2017-10-23 DIAGNOSIS — E039 Hypothyroidism, unspecified: Secondary | ICD-10-CM | POA: Diagnosis not present

## 2017-10-23 DIAGNOSIS — F330 Major depressive disorder, recurrent, mild: Secondary | ICD-10-CM | POA: Diagnosis not present

## 2017-10-23 DIAGNOSIS — I10 Essential (primary) hypertension: Secondary | ICD-10-CM | POA: Diagnosis not present

## 2017-10-23 DIAGNOSIS — I519 Heart disease, unspecified: Secondary | ICD-10-CM | POA: Diagnosis not present

## 2017-10-23 DIAGNOSIS — I4891 Unspecified atrial fibrillation: Secondary | ICD-10-CM | POA: Diagnosis not present

## 2017-10-23 DIAGNOSIS — E785 Hyperlipidemia, unspecified: Secondary | ICD-10-CM | POA: Diagnosis not present

## 2017-10-23 NOTE — Telephone Encounter (Signed)
Aware. 

## 2017-10-23 NOTE — Telephone Encounter (Signed)
We have increased her pain meds and she still saying her pain is not controlled   Don't feel like it should be increased  Wants to know if there have been problems controlling pain in past

## 2017-10-23 NOTE — Telephone Encounter (Signed)
Yes, patient has had poor control of her pain in the past.

## 2017-10-26 DIAGNOSIS — R531 Weakness: Secondary | ICD-10-CM | POA: Diagnosis not present

## 2017-10-26 DIAGNOSIS — E785 Hyperlipidemia, unspecified: Secondary | ICD-10-CM | POA: Diagnosis not present

## 2017-10-26 DIAGNOSIS — I519 Heart disease, unspecified: Secondary | ICD-10-CM | POA: Diagnosis not present

## 2017-10-26 DIAGNOSIS — E039 Hypothyroidism, unspecified: Secondary | ICD-10-CM | POA: Diagnosis not present

## 2017-10-26 DIAGNOSIS — R42 Dizziness and giddiness: Secondary | ICD-10-CM | POA: Diagnosis not present

## 2017-10-26 DIAGNOSIS — F330 Major depressive disorder, recurrent, mild: Secondary | ICD-10-CM | POA: Diagnosis not present

## 2017-10-26 DIAGNOSIS — R0602 Shortness of breath: Secondary | ICD-10-CM | POA: Diagnosis not present

## 2017-10-26 DIAGNOSIS — I4891 Unspecified atrial fibrillation: Secondary | ICD-10-CM | POA: Diagnosis not present

## 2017-10-26 DIAGNOSIS — R52 Pain, unspecified: Secondary | ICD-10-CM | POA: Diagnosis not present

## 2017-10-26 DIAGNOSIS — I10 Essential (primary) hypertension: Secondary | ICD-10-CM | POA: Diagnosis not present

## 2017-10-27 DIAGNOSIS — I10 Essential (primary) hypertension: Secondary | ICD-10-CM | POA: Diagnosis not present

## 2017-10-27 DIAGNOSIS — E039 Hypothyroidism, unspecified: Secondary | ICD-10-CM | POA: Diagnosis not present

## 2017-10-27 DIAGNOSIS — I4891 Unspecified atrial fibrillation: Secondary | ICD-10-CM | POA: Diagnosis not present

## 2017-10-27 DIAGNOSIS — I519 Heart disease, unspecified: Secondary | ICD-10-CM | POA: Diagnosis not present

## 2017-10-27 DIAGNOSIS — F330 Major depressive disorder, recurrent, mild: Secondary | ICD-10-CM | POA: Diagnosis not present

## 2017-10-27 DIAGNOSIS — R69 Illness, unspecified: Secondary | ICD-10-CM | POA: Diagnosis not present

## 2017-10-27 DIAGNOSIS — E785 Hyperlipidemia, unspecified: Secondary | ICD-10-CM | POA: Diagnosis not present

## 2017-10-28 DIAGNOSIS — I519 Heart disease, unspecified: Secondary | ICD-10-CM | POA: Diagnosis not present

## 2017-10-28 DIAGNOSIS — E039 Hypothyroidism, unspecified: Secondary | ICD-10-CM | POA: Diagnosis not present

## 2017-10-28 DIAGNOSIS — F330 Major depressive disorder, recurrent, mild: Secondary | ICD-10-CM | POA: Diagnosis not present

## 2017-10-28 DIAGNOSIS — I4891 Unspecified atrial fibrillation: Secondary | ICD-10-CM | POA: Diagnosis not present

## 2017-10-28 DIAGNOSIS — I10 Essential (primary) hypertension: Secondary | ICD-10-CM | POA: Diagnosis not present

## 2017-10-28 DIAGNOSIS — E785 Hyperlipidemia, unspecified: Secondary | ICD-10-CM | POA: Diagnosis not present

## 2017-10-29 DIAGNOSIS — E039 Hypothyroidism, unspecified: Secondary | ICD-10-CM | POA: Diagnosis not present

## 2017-10-29 DIAGNOSIS — E785 Hyperlipidemia, unspecified: Secondary | ICD-10-CM | POA: Diagnosis not present

## 2017-10-29 DIAGNOSIS — I519 Heart disease, unspecified: Secondary | ICD-10-CM | POA: Diagnosis not present

## 2017-10-29 DIAGNOSIS — F330 Major depressive disorder, recurrent, mild: Secondary | ICD-10-CM | POA: Diagnosis not present

## 2017-10-29 DIAGNOSIS — I10 Essential (primary) hypertension: Secondary | ICD-10-CM | POA: Diagnosis not present

## 2017-10-29 DIAGNOSIS — I4891 Unspecified atrial fibrillation: Secondary | ICD-10-CM | POA: Diagnosis not present

## 2017-10-30 DIAGNOSIS — F330 Major depressive disorder, recurrent, mild: Secondary | ICD-10-CM | POA: Diagnosis not present

## 2017-10-30 DIAGNOSIS — E785 Hyperlipidemia, unspecified: Secondary | ICD-10-CM | POA: Diagnosis not present

## 2017-10-30 DIAGNOSIS — I519 Heart disease, unspecified: Secondary | ICD-10-CM | POA: Diagnosis not present

## 2017-10-30 DIAGNOSIS — I4891 Unspecified atrial fibrillation: Secondary | ICD-10-CM | POA: Diagnosis not present

## 2017-10-30 DIAGNOSIS — I10 Essential (primary) hypertension: Secondary | ICD-10-CM | POA: Diagnosis not present

## 2017-10-30 DIAGNOSIS — E039 Hypothyroidism, unspecified: Secondary | ICD-10-CM | POA: Diagnosis not present

## 2017-10-31 ENCOUNTER — Other Ambulatory Visit: Payer: Self-pay | Admitting: Family

## 2017-11-02 DIAGNOSIS — E039 Hypothyroidism, unspecified: Secondary | ICD-10-CM | POA: Diagnosis not present

## 2017-11-02 DIAGNOSIS — I519 Heart disease, unspecified: Secondary | ICD-10-CM | POA: Diagnosis not present

## 2017-11-02 DIAGNOSIS — E785 Hyperlipidemia, unspecified: Secondary | ICD-10-CM | POA: Diagnosis not present

## 2017-11-02 DIAGNOSIS — I10 Essential (primary) hypertension: Secondary | ICD-10-CM | POA: Diagnosis not present

## 2017-11-02 DIAGNOSIS — I4891 Unspecified atrial fibrillation: Secondary | ICD-10-CM | POA: Diagnosis not present

## 2017-11-02 DIAGNOSIS — F330 Major depressive disorder, recurrent, mild: Secondary | ICD-10-CM | POA: Diagnosis not present

## 2017-11-03 ENCOUNTER — Other Ambulatory Visit: Payer: Self-pay | Admitting: *Deleted

## 2017-11-03 DIAGNOSIS — I10 Essential (primary) hypertension: Secondary | ICD-10-CM | POA: Diagnosis not present

## 2017-11-03 DIAGNOSIS — I4891 Unspecified atrial fibrillation: Secondary | ICD-10-CM | POA: Diagnosis not present

## 2017-11-03 DIAGNOSIS — E039 Hypothyroidism, unspecified: Secondary | ICD-10-CM | POA: Diagnosis not present

## 2017-11-03 DIAGNOSIS — F330 Major depressive disorder, recurrent, mild: Secondary | ICD-10-CM | POA: Diagnosis not present

## 2017-11-03 DIAGNOSIS — I519 Heart disease, unspecified: Secondary | ICD-10-CM | POA: Diagnosis not present

## 2017-11-03 DIAGNOSIS — E785 Hyperlipidemia, unspecified: Secondary | ICD-10-CM | POA: Diagnosis not present

## 2017-11-03 MED ORDER — FUROSEMIDE 20 MG PO TABS: 20.0000 mg | ORAL_TABLET | Freq: Every day | ORAL | 0 refills | Status: DC

## 2017-11-03 MED ORDER — QUINAPRIL HCL 40 MG PO TABS: 40.0000 mg | ORAL_TABLET | Freq: Every day | ORAL | 0 refills | Status: DC

## 2017-11-04 ENCOUNTER — Other Ambulatory Visit: Payer: Self-pay | Admitting: Family

## 2017-11-04 DIAGNOSIS — R69 Illness, unspecified: Secondary | ICD-10-CM | POA: Diagnosis not present

## 2017-11-04 DIAGNOSIS — I4891 Unspecified atrial fibrillation: Secondary | ICD-10-CM | POA: Diagnosis not present

## 2017-11-04 DIAGNOSIS — F330 Major depressive disorder, recurrent, mild: Secondary | ICD-10-CM | POA: Diagnosis not present

## 2017-11-04 DIAGNOSIS — I519 Heart disease, unspecified: Secondary | ICD-10-CM | POA: Diagnosis not present

## 2017-11-04 DIAGNOSIS — E785 Hyperlipidemia, unspecified: Secondary | ICD-10-CM | POA: Diagnosis not present

## 2017-11-04 DIAGNOSIS — E039 Hypothyroidism, unspecified: Secondary | ICD-10-CM | POA: Diagnosis not present

## 2017-11-04 DIAGNOSIS — I10 Essential (primary) hypertension: Secondary | ICD-10-CM | POA: Diagnosis not present

## 2017-11-05 DIAGNOSIS — I10 Essential (primary) hypertension: Secondary | ICD-10-CM | POA: Diagnosis not present

## 2017-11-05 DIAGNOSIS — E039 Hypothyroidism, unspecified: Secondary | ICD-10-CM | POA: Diagnosis not present

## 2017-11-05 DIAGNOSIS — F330 Major depressive disorder, recurrent, mild: Secondary | ICD-10-CM | POA: Diagnosis not present

## 2017-11-05 DIAGNOSIS — I519 Heart disease, unspecified: Secondary | ICD-10-CM | POA: Diagnosis not present

## 2017-11-05 DIAGNOSIS — I4891 Unspecified atrial fibrillation: Secondary | ICD-10-CM | POA: Diagnosis not present

## 2017-11-05 DIAGNOSIS — E785 Hyperlipidemia, unspecified: Secondary | ICD-10-CM | POA: Diagnosis not present

## 2017-11-06 DIAGNOSIS — E039 Hypothyroidism, unspecified: Secondary | ICD-10-CM | POA: Diagnosis not present

## 2017-11-06 DIAGNOSIS — I10 Essential (primary) hypertension: Secondary | ICD-10-CM | POA: Diagnosis not present

## 2017-11-06 DIAGNOSIS — I519 Heart disease, unspecified: Secondary | ICD-10-CM | POA: Diagnosis not present

## 2017-11-06 DIAGNOSIS — F330 Major depressive disorder, recurrent, mild: Secondary | ICD-10-CM | POA: Diagnosis not present

## 2017-11-06 DIAGNOSIS — I4891 Unspecified atrial fibrillation: Secondary | ICD-10-CM | POA: Diagnosis not present

## 2017-11-06 DIAGNOSIS — E785 Hyperlipidemia, unspecified: Secondary | ICD-10-CM | POA: Diagnosis not present

## 2017-11-10 DIAGNOSIS — E039 Hypothyroidism, unspecified: Secondary | ICD-10-CM | POA: Diagnosis not present

## 2017-11-10 DIAGNOSIS — I10 Essential (primary) hypertension: Secondary | ICD-10-CM | POA: Diagnosis not present

## 2017-11-10 DIAGNOSIS — I4891 Unspecified atrial fibrillation: Secondary | ICD-10-CM | POA: Diagnosis not present

## 2017-11-10 DIAGNOSIS — E785 Hyperlipidemia, unspecified: Secondary | ICD-10-CM | POA: Diagnosis not present

## 2017-11-10 DIAGNOSIS — F330 Major depressive disorder, recurrent, mild: Secondary | ICD-10-CM | POA: Diagnosis not present

## 2017-11-10 DIAGNOSIS — I519 Heart disease, unspecified: Secondary | ICD-10-CM | POA: Diagnosis not present

## 2017-11-12 DIAGNOSIS — I10 Essential (primary) hypertension: Secondary | ICD-10-CM | POA: Diagnosis not present

## 2017-11-12 DIAGNOSIS — E039 Hypothyroidism, unspecified: Secondary | ICD-10-CM | POA: Diagnosis not present

## 2017-11-12 DIAGNOSIS — F330 Major depressive disorder, recurrent, mild: Secondary | ICD-10-CM | POA: Diagnosis not present

## 2017-11-12 DIAGNOSIS — I519 Heart disease, unspecified: Secondary | ICD-10-CM | POA: Diagnosis not present

## 2017-11-12 DIAGNOSIS — E785 Hyperlipidemia, unspecified: Secondary | ICD-10-CM | POA: Diagnosis not present

## 2017-11-12 DIAGNOSIS — I4891 Unspecified atrial fibrillation: Secondary | ICD-10-CM | POA: Diagnosis not present

## 2017-11-13 DIAGNOSIS — E785 Hyperlipidemia, unspecified: Secondary | ICD-10-CM | POA: Diagnosis not present

## 2017-11-13 DIAGNOSIS — F330 Major depressive disorder, recurrent, mild: Secondary | ICD-10-CM | POA: Diagnosis not present

## 2017-11-13 DIAGNOSIS — I519 Heart disease, unspecified: Secondary | ICD-10-CM | POA: Diagnosis not present

## 2017-11-13 DIAGNOSIS — I10 Essential (primary) hypertension: Secondary | ICD-10-CM | POA: Diagnosis not present

## 2017-11-13 DIAGNOSIS — I4891 Unspecified atrial fibrillation: Secondary | ICD-10-CM | POA: Diagnosis not present

## 2017-11-13 DIAGNOSIS — E039 Hypothyroidism, unspecified: Secondary | ICD-10-CM | POA: Diagnosis not present

## 2017-11-17 DIAGNOSIS — I519 Heart disease, unspecified: Secondary | ICD-10-CM | POA: Diagnosis not present

## 2017-11-17 DIAGNOSIS — I4891 Unspecified atrial fibrillation: Secondary | ICD-10-CM | POA: Diagnosis not present

## 2017-11-17 DIAGNOSIS — E785 Hyperlipidemia, unspecified: Secondary | ICD-10-CM | POA: Diagnosis not present

## 2017-11-17 DIAGNOSIS — F330 Major depressive disorder, recurrent, mild: Secondary | ICD-10-CM | POA: Diagnosis not present

## 2017-11-17 DIAGNOSIS — I10 Essential (primary) hypertension: Secondary | ICD-10-CM | POA: Diagnosis not present

## 2017-11-17 DIAGNOSIS — E039 Hypothyroidism, unspecified: Secondary | ICD-10-CM | POA: Diagnosis not present

## 2017-11-19 ENCOUNTER — Telehealth: Payer: Self-pay | Admitting: *Deleted

## 2017-11-19 DIAGNOSIS — I10 Essential (primary) hypertension: Secondary | ICD-10-CM | POA: Diagnosis not present

## 2017-11-19 DIAGNOSIS — E785 Hyperlipidemia, unspecified: Secondary | ICD-10-CM | POA: Diagnosis not present

## 2017-11-19 DIAGNOSIS — I4891 Unspecified atrial fibrillation: Secondary | ICD-10-CM | POA: Diagnosis not present

## 2017-11-19 DIAGNOSIS — F330 Major depressive disorder, recurrent, mild: Secondary | ICD-10-CM | POA: Diagnosis not present

## 2017-11-19 DIAGNOSIS — I519 Heart disease, unspecified: Secondary | ICD-10-CM | POA: Diagnosis not present

## 2017-11-19 DIAGNOSIS — E039 Hypothyroidism, unspecified: Secondary | ICD-10-CM | POA: Diagnosis not present

## 2017-11-19 NOTE — Telephone Encounter (Signed)
TC from Hospice nurse Would like an RX for Nystatin powder for patients 'folds' Send to [facility] and let Hospice nurse know if it was sent

## 2017-11-20 DIAGNOSIS — E039 Hypothyroidism, unspecified: Secondary | ICD-10-CM | POA: Diagnosis not present

## 2017-11-20 DIAGNOSIS — I10 Essential (primary) hypertension: Secondary | ICD-10-CM | POA: Diagnosis not present

## 2017-11-20 DIAGNOSIS — F330 Major depressive disorder, recurrent, mild: Secondary | ICD-10-CM | POA: Diagnosis not present

## 2017-11-20 DIAGNOSIS — I4891 Unspecified atrial fibrillation: Secondary | ICD-10-CM | POA: Diagnosis not present

## 2017-11-20 DIAGNOSIS — E785 Hyperlipidemia, unspecified: Secondary | ICD-10-CM | POA: Diagnosis not present

## 2017-11-20 DIAGNOSIS — I519 Heart disease, unspecified: Secondary | ICD-10-CM | POA: Diagnosis not present

## 2017-11-22 MED ORDER — NYSTATIN 100000 UNIT/GM EX POWD: Freq: Four times a day (QID) | CUTANEOUS | 0 refills | Status: DC

## 2017-11-22 NOTE — Telephone Encounter (Signed)
Prescription sent to pharmacy.

## 2017-11-23 DIAGNOSIS — E785 Hyperlipidemia, unspecified: Secondary | ICD-10-CM | POA: Diagnosis not present

## 2017-11-23 DIAGNOSIS — I519 Heart disease, unspecified: Secondary | ICD-10-CM | POA: Diagnosis not present

## 2017-11-23 DIAGNOSIS — E039 Hypothyroidism, unspecified: Secondary | ICD-10-CM | POA: Diagnosis not present

## 2017-11-23 DIAGNOSIS — F330 Major depressive disorder, recurrent, mild: Secondary | ICD-10-CM | POA: Diagnosis not present

## 2017-11-23 DIAGNOSIS — I10 Essential (primary) hypertension: Secondary | ICD-10-CM | POA: Diagnosis not present

## 2017-11-23 DIAGNOSIS — I4891 Unspecified atrial fibrillation: Secondary | ICD-10-CM | POA: Diagnosis not present

## 2017-11-24 ENCOUNTER — Ambulatory Visit (INDEPENDENT_AMBULATORY_CARE_PROVIDER_SITE_OTHER): Payer: Medicare Other

## 2017-11-24 DIAGNOSIS — I4891 Unspecified atrial fibrillation: Secondary | ICD-10-CM | POA: Diagnosis not present

## 2017-11-24 DIAGNOSIS — R52 Pain, unspecified: Secondary | ICD-10-CM

## 2017-11-24 DIAGNOSIS — E039 Hypothyroidism, unspecified: Secondary | ICD-10-CM

## 2017-11-24 DIAGNOSIS — R531 Weakness: Secondary | ICD-10-CM | POA: Diagnosis not present

## 2017-11-24 DIAGNOSIS — I10 Essential (primary) hypertension: Secondary | ICD-10-CM | POA: Diagnosis not present

## 2017-11-24 DIAGNOSIS — F330 Major depressive disorder, recurrent, mild: Secondary | ICD-10-CM

## 2017-11-24 DIAGNOSIS — E785 Hyperlipidemia, unspecified: Secondary | ICD-10-CM | POA: Diagnosis not present

## 2017-11-24 DIAGNOSIS — R0602 Shortness of breath: Secondary | ICD-10-CM | POA: Diagnosis not present

## 2017-11-24 DIAGNOSIS — I519 Heart disease, unspecified: Secondary | ICD-10-CM

## 2017-11-24 DIAGNOSIS — R42 Dizziness and giddiness: Secondary | ICD-10-CM

## 2017-11-25 ENCOUNTER — Telehealth: Payer: Self-pay | Admitting: *Deleted

## 2017-11-25 DIAGNOSIS — R52 Pain, unspecified: Secondary | ICD-10-CM | POA: Diagnosis not present

## 2017-11-25 DIAGNOSIS — I10 Essential (primary) hypertension: Secondary | ICD-10-CM | POA: Diagnosis not present

## 2017-11-25 DIAGNOSIS — I4891 Unspecified atrial fibrillation: Secondary | ICD-10-CM | POA: Diagnosis not present

## 2017-11-25 DIAGNOSIS — F330 Major depressive disorder, recurrent, mild: Secondary | ICD-10-CM | POA: Diagnosis not present

## 2017-11-25 DIAGNOSIS — I519 Heart disease, unspecified: Secondary | ICD-10-CM | POA: Diagnosis not present

## 2017-11-25 DIAGNOSIS — E039 Hypothyroidism, unspecified: Secondary | ICD-10-CM | POA: Diagnosis not present

## 2017-11-25 DIAGNOSIS — E785 Hyperlipidemia, unspecified: Secondary | ICD-10-CM | POA: Diagnosis not present

## 2017-11-25 DIAGNOSIS — R55 Syncope and collapse: Secondary | ICD-10-CM

## 2017-11-25 DIAGNOSIS — R531 Weakness: Secondary | ICD-10-CM | POA: Diagnosis not present

## 2017-11-25 DIAGNOSIS — R0602 Shortness of breath: Secondary | ICD-10-CM | POA: Diagnosis not present

## 2017-11-25 DIAGNOSIS — R42 Dizziness and giddiness: Secondary | ICD-10-CM | POA: Diagnosis not present

## 2017-11-25 HISTORY — DX: Syncope and collapse: R55

## 2017-11-25 NOTE — Telephone Encounter (Signed)
VM received Daughter asking Hospice nurse if we can prescribed Gabapentin for patient for her neuropathy Please advise, send to [facility] and let Hospice nurse know

## 2017-11-26 MED ORDER — GABAPENTIN 100 MG PO CAPS: 100.0000 mg | ORAL_CAPSULE | Freq: Three times a day (TID) | ORAL | 3 refills | Status: DC

## 2017-11-26 NOTE — Telephone Encounter (Signed)
Gabapentin Prescription sent to pharmacy   

## 2017-11-26 NOTE — Telephone Encounter (Signed)
Hospice aware

## 2017-11-27 DIAGNOSIS — I10 Essential (primary) hypertension: Secondary | ICD-10-CM | POA: Diagnosis not present

## 2017-11-27 DIAGNOSIS — F330 Major depressive disorder, recurrent, mild: Secondary | ICD-10-CM | POA: Diagnosis not present

## 2017-11-27 DIAGNOSIS — I4891 Unspecified atrial fibrillation: Secondary | ICD-10-CM | POA: Diagnosis not present

## 2017-11-27 DIAGNOSIS — E039 Hypothyroidism, unspecified: Secondary | ICD-10-CM | POA: Diagnosis not present

## 2017-11-27 DIAGNOSIS — E785 Hyperlipidemia, unspecified: Secondary | ICD-10-CM | POA: Diagnosis not present

## 2017-11-27 DIAGNOSIS — I519 Heart disease, unspecified: Secondary | ICD-10-CM | POA: Diagnosis not present

## 2017-11-29 DIAGNOSIS — R69 Illness, unspecified: Secondary | ICD-10-CM | POA: Diagnosis not present

## 2017-11-30 DIAGNOSIS — F330 Major depressive disorder, recurrent, mild: Secondary | ICD-10-CM | POA: Diagnosis not present

## 2017-11-30 DIAGNOSIS — E039 Hypothyroidism, unspecified: Secondary | ICD-10-CM | POA: Diagnosis not present

## 2017-11-30 DIAGNOSIS — I4891 Unspecified atrial fibrillation: Secondary | ICD-10-CM | POA: Diagnosis not present

## 2017-11-30 DIAGNOSIS — I519 Heart disease, unspecified: Secondary | ICD-10-CM | POA: Diagnosis not present

## 2017-11-30 DIAGNOSIS — E785 Hyperlipidemia, unspecified: Secondary | ICD-10-CM | POA: Diagnosis not present

## 2017-11-30 DIAGNOSIS — I10 Essential (primary) hypertension: Secondary | ICD-10-CM | POA: Diagnosis not present

## 2017-12-01 ENCOUNTER — Emergency Department (HOSPITAL_COMMUNITY): Payer: Medicare Other

## 2017-12-01 ENCOUNTER — Observation Stay (HOSPITAL_COMMUNITY)
Admission: EM | Admit: 2017-12-01 | Discharge: 2017-12-03 | Disposition: A | Discharge status: Hospice | Payer: Medicare Other | Attending: Internal Medicine | Admitting: Internal Medicine

## 2017-12-01 ENCOUNTER — Observation Stay (HOSPITAL_COMMUNITY): Payer: Medicare Other

## 2017-12-01 ENCOUNTER — Encounter (HOSPITAL_COMMUNITY): Payer: Self-pay | Admitting: Pharmacy Technician

## 2017-12-01 DIAGNOSIS — J9610 Chronic respiratory failure, unspecified whether with hypoxia or hypercapnia: Secondary | ICD-10-CM | POA: Insufficient documentation

## 2017-12-01 DIAGNOSIS — K219 Gastro-esophageal reflux disease without esophagitis: Secondary | ICD-10-CM | POA: Insufficient documentation

## 2017-12-01 DIAGNOSIS — E785 Hyperlipidemia, unspecified: Secondary | ICD-10-CM | POA: Insufficient documentation

## 2017-12-01 DIAGNOSIS — E1140 Type 2 diabetes mellitus with diabetic neuropathy, unspecified: Secondary | ICD-10-CM | POA: Diagnosis not present

## 2017-12-01 DIAGNOSIS — Z794 Long term (current) use of insulin: Secondary | ICD-10-CM | POA: Diagnosis not present

## 2017-12-01 DIAGNOSIS — Z66 Do not resuscitate: Secondary | ICD-10-CM | POA: Diagnosis not present

## 2017-12-01 DIAGNOSIS — F329 Major depressive disorder, single episode, unspecified: Secondary | ICD-10-CM | POA: Insufficient documentation

## 2017-12-01 DIAGNOSIS — Z7982 Long term (current) use of aspirin: Secondary | ICD-10-CM | POA: Diagnosis not present

## 2017-12-01 DIAGNOSIS — R2681 Unsteadiness on feet: Secondary | ICD-10-CM | POA: Insufficient documentation

## 2017-12-01 DIAGNOSIS — R42 Dizziness and giddiness: Secondary | ICD-10-CM | POA: Diagnosis not present

## 2017-12-01 DIAGNOSIS — I6523 Occlusion and stenosis of bilateral carotid arteries: Secondary | ICD-10-CM | POA: Diagnosis not present

## 2017-12-01 DIAGNOSIS — M79605 Pain in left leg: Secondary | ICD-10-CM

## 2017-12-01 DIAGNOSIS — I959 Hypotension, unspecified: Secondary | ICD-10-CM | POA: Diagnosis not present

## 2017-12-01 DIAGNOSIS — E039 Hypothyroidism, unspecified: Secondary | ICD-10-CM | POA: Diagnosis not present

## 2017-12-01 DIAGNOSIS — Z880 Allergy status to penicillin: Secondary | ICD-10-CM | POA: Insufficient documentation

## 2017-12-01 DIAGNOSIS — I2510 Atherosclerotic heart disease of native coronary artery without angina pectoris: Secondary | ICD-10-CM | POA: Diagnosis not present

## 2017-12-01 DIAGNOSIS — R55 Syncope and collapse: Secondary | ICD-10-CM | POA: Diagnosis not present

## 2017-12-01 DIAGNOSIS — I5032 Chronic diastolic (congestive) heart failure: Secondary | ICD-10-CM | POA: Diagnosis not present

## 2017-12-01 DIAGNOSIS — I700 Atherosclerosis of aorta: Secondary | ICD-10-CM | POA: Insufficient documentation

## 2017-12-01 DIAGNOSIS — Z79899 Other long term (current) drug therapy: Secondary | ICD-10-CM | POA: Insufficient documentation

## 2017-12-01 DIAGNOSIS — R296 Repeated falls: Secondary | ICD-10-CM | POA: Diagnosis not present

## 2017-12-01 DIAGNOSIS — M8580 Other specified disorders of bone density and structure, unspecified site: Secondary | ICD-10-CM | POA: Insufficient documentation

## 2017-12-01 DIAGNOSIS — Z882 Allergy status to sulfonamides status: Secondary | ICD-10-CM | POA: Diagnosis not present

## 2017-12-01 DIAGNOSIS — M19072 Primary osteoarthritis, left ankle and foot: Secondary | ICD-10-CM | POA: Insufficient documentation

## 2017-12-01 DIAGNOSIS — I110 Hypertensive heart disease with heart failure: Secondary | ICD-10-CM | POA: Diagnosis not present

## 2017-12-01 DIAGNOSIS — R05 Cough: Secondary | ICD-10-CM | POA: Diagnosis not present

## 2017-12-01 DIAGNOSIS — Z7901 Long term (current) use of anticoagulants: Secondary | ICD-10-CM | POA: Insufficient documentation

## 2017-12-01 DIAGNOSIS — Z6841 Body Mass Index (BMI) 40.0 and over, adult: Secondary | ICD-10-CM | POA: Diagnosis not present

## 2017-12-01 DIAGNOSIS — S9032XA Contusion of left foot, initial encounter: Secondary | ICD-10-CM | POA: Diagnosis not present

## 2017-12-01 HISTORY — DX: Chronic obstructive pulmonary disease, unspecified: J44.9

## 2017-12-01 HISTORY — DX: Syncope and collapse: R55

## 2017-12-01 LAB — COMPREHENSIVE METABOLIC PANEL
ALT: 23 U/L (ref 14–54)
ANION GAP: 7 (ref 5–15)
AST: 24 U/L (ref 15–41)
Albumin: 3.7 g/dL (ref 3.5–5.0)
Alkaline Phosphatase: 76 U/L (ref 38–126)
BUN: 11 mg/dL (ref 6–20)
CALCIUM: 9.2 mg/dL (ref 8.9–10.3)
CO2: 34 mmol/L — ABNORMAL HIGH (ref 22–32)
Chloride: 100 mmol/L — ABNORMAL LOW (ref 101–111)
Creatinine, Ser: 0.8 mg/dL (ref 0.44–1.00)
Glucose, Bld: 110 mg/dL — ABNORMAL HIGH (ref 65–99)
Potassium: 4 mmol/L (ref 3.5–5.1)
SODIUM: 141 mmol/L (ref 135–145)
TOTAL PROTEIN: 6.6 g/dL (ref 6.5–8.1)
Total Bilirubin: 0.5 mg/dL (ref 0.3–1.2)

## 2017-12-01 LAB — VITAMIN B12: Vitamin B-12: 239 pg/mL (ref 180–914)

## 2017-12-01 LAB — CBC WITH DIFFERENTIAL/PLATELET
Basophils Absolute: 0 10*3/uL (ref 0.0–0.1)
Basophils Relative: 0 %
EOS ABS: 0.1 10*3/uL (ref 0.0–0.7)
EOS PCT: 1 %
HCT: 39.7 % (ref 36.0–46.0)
Hemoglobin: 12.6 g/dL (ref 12.0–15.0)
LYMPHS ABS: 1.4 10*3/uL (ref 0.7–4.0)
LYMPHS PCT: 20 %
MCH: 29.6 pg (ref 26.0–34.0)
MCHC: 31.7 g/dL (ref 30.0–36.0)
MCV: 93.2 fL (ref 78.0–100.0)
MONO ABS: 0.4 10*3/uL (ref 0.1–1.0)
MONOS PCT: 5 %
Neutro Abs: 5.2 10*3/uL (ref 1.7–7.7)
Neutrophils Relative %: 74 %
PLATELETS: 164 10*3/uL (ref 150–400)
RBC: 4.26 MIL/uL (ref 3.87–5.11)
RDW: 13.6 % (ref 11.5–15.5)
WBC: 7 10*3/uL (ref 4.0–10.5)

## 2017-12-01 LAB — I-STAT TROPONIN, ED
TROPONIN I, POC: 0.01 ng/mL (ref 0.00–0.08)
Troponin i, poc: 0.03 ng/mL (ref 0.00–0.08)

## 2017-12-01 LAB — CORTISOL-AM, BLOOD: CORTISOL - AM: 6.4 ug/dL — AB (ref 6.7–22.6)

## 2017-12-01 LAB — LIPASE, BLOOD: LIPASE: 26 U/L (ref 11–51)

## 2017-12-01 LAB — URINALYSIS, ROUTINE W REFLEX MICROSCOPIC
BILIRUBIN URINE: NEGATIVE
GLUCOSE, UA: NEGATIVE mg/dL
HGB URINE DIPSTICK: NEGATIVE
Ketones, ur: NEGATIVE mg/dL
Leukocytes, UA: NEGATIVE
Nitrite: NEGATIVE
PH: 8 (ref 5.0–8.0)
Protein, ur: NEGATIVE mg/dL
SPECIFIC GRAVITY, URINE: 1.004 — AB (ref 1.005–1.030)

## 2017-12-01 LAB — MAGNESIUM: Magnesium: 1.7 mg/dL (ref 1.7–2.4)

## 2017-12-01 LAB — HEMOGLOBIN A1C
Hgb A1c MFr Bld: 5.4 % (ref 4.8–5.6)
MEAN PLASMA GLUCOSE: 108.28 mg/dL

## 2017-12-01 LAB — BRAIN NATRIURETIC PEPTIDE: B Natriuretic Peptide: 85.8 pg/mL (ref 0.0–100.0)

## 2017-12-01 LAB — TSH: TSH: 1.474 u[IU]/mL (ref 0.350–4.500)

## 2017-12-01 LAB — I-STAT CG4 LACTIC ACID, ED: LACTIC ACID, VENOUS: 0.76 mmol/L (ref 0.5–1.9)

## 2017-12-01 MED ORDER — ACETAMINOPHEN 650 MG RE SUPP: 650.0000 mg | Freq: Four times a day (QID) | RECTAL | Status: DC | PRN

## 2017-12-01 MED ORDER — FUROSEMIDE 20 MG PO TABS
20.0000 mg | ORAL_TABLET | Freq: Every day | ORAL | Status: DC
  Administered 2017-12-02 – 2017-12-03 (×2): 20 mg via ORAL
  Filled 2017-12-01 (×2): qty 1

## 2017-12-01 MED ORDER — LEVOTHYROXINE SODIUM 50 MCG PO TABS
50.0000 ug | ORAL_TABLET | Freq: Every day | ORAL | Status: DC
  Administered 2017-12-02 – 2017-12-03 (×2): 50 ug via ORAL
  Filled 2017-12-01 (×2): qty 1

## 2017-12-01 MED ORDER — OXYCODONE-ACETAMINOPHEN 5-325 MG PO TABS
1.0000 | ORAL_TABLET | Freq: Once | ORAL | Status: AC
  Administered 2017-12-01: 1 via ORAL
  Filled 2017-12-01: qty 1

## 2017-12-01 MED ORDER — CLONAZEPAM 0.5 MG PO TABS
0.5000 mg | ORAL_TABLET | Freq: Two times a day (BID) | ORAL | Status: DC | PRN
  Administered 2017-12-02 – 2017-12-03 (×2): 0.5 mg via ORAL
  Filled 2017-12-01 (×3): qty 1

## 2017-12-01 MED ORDER — ASPIRIN EC 81 MG PO TBEC
81.0000 mg | DELAYED_RELEASE_TABLET | Freq: Every day | ORAL | Status: DC
  Administered 2017-12-02 – 2017-12-03 (×2): 81 mg via ORAL
  Filled 2017-12-01 (×2): qty 1

## 2017-12-01 MED ORDER — MECLIZINE HCL 25 MG PO TABS: 25.0000 mg | ORAL_TABLET | Freq: Three times a day (TID) | ORAL | Status: DC | PRN

## 2017-12-01 MED ORDER — OXYCODONE HCL 5 MG PO TABS
5.0000 mg | ORAL_TABLET | Freq: Three times a day (TID) | ORAL | Status: DC | PRN
  Administered 2017-12-02 – 2017-12-03 (×3): 5 mg via ORAL
  Filled 2017-12-01 (×3): qty 1

## 2017-12-01 MED ORDER — QUINAPRIL HCL 10 MG PO TABS
40.0000 mg | ORAL_TABLET | Freq: Every day | ORAL | Status: DC
  Administered 2017-12-02 – 2017-12-03 (×2): 40 mg via ORAL
  Filled 2017-12-01 (×2): qty 4

## 2017-12-01 MED ORDER — ATORVASTATIN CALCIUM 10 MG PO TABS
10.0000 mg | ORAL_TABLET | Freq: Every day | ORAL | Status: DC
  Administered 2017-12-02 – 2017-12-03 (×2): 10 mg via ORAL
  Filled 2017-12-01 (×2): qty 1

## 2017-12-01 MED ORDER — CLONAZEPAM 0.5 MG PO TABS
0.5000 mg | ORAL_TABLET | Freq: Once | ORAL | Status: AC
  Administered 2017-12-01: 0.5 mg via ORAL
  Filled 2017-12-01: qty 1

## 2017-12-01 MED ORDER — VITAMIN D 1000 UNITS PO TABS
2000.0000 [IU] | ORAL_TABLET | Freq: Every day | ORAL | Status: DC
  Administered 2017-12-02 – 2017-12-03 (×2): 2000 [IU] via ORAL
  Filled 2017-12-01 (×3): qty 2

## 2017-12-01 MED ORDER — OXYCODONE-ACETAMINOPHEN 10-325 MG PO TABS: 1.0000 | ORAL_TABLET | Freq: Three times a day (TID) | ORAL | Status: DC | PRN

## 2017-12-01 MED ORDER — ACETAMINOPHEN 325 MG PO TABS: 650.0000 mg | ORAL_TABLET | Freq: Four times a day (QID) | ORAL | Status: DC | PRN

## 2017-12-01 MED ORDER — ALBUTEROL SULFATE (2.5 MG/3ML) 0.083% IN NEBU: 2.5000 mg | INHALATION_SOLUTION | RESPIRATORY_TRACT | Status: DC | PRN

## 2017-12-01 MED ORDER — SERTRALINE HCL 100 MG PO TABS
100.0000 mg | ORAL_TABLET | Freq: Every day | ORAL | Status: DC
  Administered 2017-12-02 – 2017-12-03 (×2): 100 mg via ORAL
  Filled 2017-12-01 (×2): qty 1

## 2017-12-01 MED ORDER — OXYCODONE-ACETAMINOPHEN 5-325 MG PO TABS
1.0000 | ORAL_TABLET | Freq: Three times a day (TID) | ORAL | Status: DC | PRN
  Administered 2017-12-02 (×2): 1 via ORAL
  Filled 2017-12-01 (×2): qty 1

## 2017-12-01 NOTE — ED Notes (Signed)
Advised Dr. Sherry Ruffing pt c/o left foot pain

## 2017-12-01 NOTE — ED Notes (Signed)
Patient transported to X-ray 

## 2017-12-01 NOTE — H&P (Signed)
History and Physical    Shirley Leblanc TDS:[id number] DOB: 17-Feb-1946 DOA: 12/01/2017  PCP: [name], FNP Patient coming from: Home  Chief Complaint: Dizziness and syncope  HPI: Shirley Leblanc is a 72 y.o. female with medical history significant of chronic diastolic CHF, essential hypertension, hypothyroidism, hyperlipidemia, depression, peripheral neuropathy came to the hospital for evaluation of syncope.  Patient states she has been feeling dizzy for several months and has been evaluated in the hospital previously.  For similar reason she was here back in February 2019 when she underwent extensive evaluation including echocardiogram, carotid Dopplers and neurology evaluation.  She could not get MRI due to her weight and did not her device to turn her pacemaker off, therefore it was deferred.  Is been on meclizine as well She reports her dizziness is mostly positional related.  This morning when she was trying to get up from commode she felt dizzy and passed out for few seconds without any seizure-like activities.  Does admit that she had stool incontinence but no postictal phase.  Denies any history of seizures.  EMS was called as she was unable to stand up and brought her to the hospital.  She denies any chest pain, palpitations, shortness of breath and other complaints. Apparently she has been on hospice secondary to her cardiac condition. When he arrived she was noted to be hypotensive with systolic in 26O and bradycardic in 50s but by time she came to the ER her vital signs are stable but reported of some dizziness especially with change in position.  She also reported of left leg pain therefore x-ray was done which is concerning for possible And therefore CT of the leg was ordered.  He was determined to admit her for overnight observation.  When I saw the patient she felt back at baseline and did not have any dizziness.  She denied any other complaints.    Review of Systems: As per HPI  otherwise 10 point review of systems negative.  Review of Systems Otherwise negative except as per HPI, including: General: Denies fever, chills, night sweats or unintended weight loss. Resp: Denies cough, wheezing, shortness of breath. Cardiac: Denies chest pain, palpitations, orthopnea, paroxysmal nocturnal dyspnea. GI: Denies abdominal pain, nausea, vomiting, diarrhea or constipation GU: Denies dysuria, frequency, hesitancy or incontinence MS: Denies muscle aches, joint pain or swelling Neuro: Denies headache, neurologic deficits (focal weakness, numbness, tingling), abnormal gait Psych: Denies anxiety, depression, SI/HI/AVH Skin: Denies new rashes or lesions ID: Denies sick contacts, exotic exposures, travel  Past Medical History:  Diagnosis Date  . Bursitis   . CAD (coronary artery disease)    a. nonobstructive CAD with 40% Prox LAD stenosis by cath in 2007  . Depression   . DJD (degenerative joint disease)   . GERD (gastroesophageal reflux disease)   . Hyperlipemia   . Hypertension   . Left wrist fracture   . Obesities, morbid ([place])   . Open left ankle fracture   . Urinary incontinence     Past Surgical History:  Procedure Laterality Date  . CARDIAC CATHETERIZATION    . CHOLECYSTECTOMY N/A 05/14/2015   Procedure: LAPAROSCOPIC CHOLECYSTECTOMY;  Surgeon: Coralie Keens, MD;  Location: [facility];  Service: General;  Laterality: N/A;  . NECK SURGERY    . TOTAL KNEE ARTHROPLASTY     x2  . VAGINAL HYSTERECTOMY      SOCIAL HISTORY:  reports that she has never smoked. She has never used smokeless tobacco. She reports that she  does not drink alcohol or use drugs.  Allergies  Allergen Reactions  . Contrast Media [Iodinated Diagnostic Agents] Anaphylaxis       . Penicillins Hives, Itching and Rash    Has patient had a PCN reaction causing immediate rash, facial/tongue/throat swelling, SOB or lightheadedness with hypotension Yes Has patient had a PCN reaction causing severe  rash involving mucus membranes or skin necrosis: Yes Has patient had a PCN reaction that required hospitalization No Has patient had a PCN reaction occurring within the last 10 years: No If all of the above answers are "NO", then may proceed with Cephalosporin use.   . Sulfa Antibiotics Hives, Itching and Rash    FAMILY HISTORY: Family History  Problem Relation Age of Onset  . Lung disease Mother   . Heart attack Father   . Lung disease Unknown   . Heart attack Unknown   . Cancer Daughter        breast     Prior to Admission medications   Medication Sig Start Date End Date Taking? Authorizing Provider  albuterol (PROVENTIL HFA;VENTOLIN HFA) 108 (90 Base) MCG/ACT inhaler TAKE 2 PUFFS BY MOUTH EVERY 6 HOURS AS NEEDED FOR WHEEZE OR SHORTNESS OF BREATH 08/07/17  Yes Hawks, Christy A, FNP  aspirin 81 MG EC tablet Take 81 mg by mouth daily.     Yes [provider]  atorvastatin (LIPITOR) 10 MG tablet TAKE 1 TABLET BY MOUTH EVERY DAY 11/04/17  Yes Hawks, Christy A, FNP  Cholecalciferol (VITAMIN D3) 2000 UNITS TABS Take 2,000 Units by mouth daily.    Yes [provider]  clonazePAM (KLONOPIN) 0.5 MG tablet Take 1 tablet (0.5 mg total) by mouth 2 (two) times daily as needed for anxiety. 10/12/17  Yes Hawks, Christy A, FNP  furosemide (LASIX) 20 MG tablet Take 1 tablet (20 mg total) by mouth daily. 11/03/17  Yes Hawks, Christy A, FNP  gabapentin (NEURONTIN) 100 MG capsule Take 1 capsule (100 mg total) by mouth 3 (three) times daily. 11/26/17  Yes Hawks, Christy A, FNP  levothyroxine (SYNTHROID, LEVOTHROID) 50 MCG tablet TAKE 1 TABLET (50 MCG TOTAL) BY MOUTH DAILY. 05/04/17  Yes Hassell Done, Mary-Margaret, FNP  meclizine (ANTIVERT) 25 MG tablet Take 1 tablet (25 mg total) by mouth 3 (three) times daily as needed for dizziness. 10/16/17  Yes Hawks, Christy A, FNP  nystatin (MYCOSTATIN/NYSTOP) powder Apply topically 4 (four) times daily. 11/22/17  Yes Hawks, Christy A, FNP    oxyCODONE-acetaminophen (PERCOCET) 10-325 MG tablet Take 1 tablet by mouth every 8 (eight) hours as needed. 07/12/14  Yes [provider]  quinapril (ACCUPRIL) 40 MG tablet Take 1 tablet (40 mg total) by mouth daily. 11/03/17  Yes Hawks, Christy A, FNP  sertraline (ZOLOFT) 100 MG tablet Take 1 tablet (100 mg total) by mouth daily. 06/23/17 12/20/17 Yes Hawks, Christy A, FNP  blood glucose meter kit and supplies KIT Dispense per insurance preference. Use up to four times daily . E 11.9 09/30/16   Evelina Dun A, FNP  glucose blood (GLUCOSE METER TEST) test strip Use BID 09/29/16   Evelina Dun A, FNP  hydrOXYzine (ATARAX/VISTARIL) 50 MG tablet TAKE 1 TABLET (50 MG TOTAL) BY MOUTH 3 (THREE) TIMES DAILY AS NEEDED. Patient not taking: Reported on 12/01/2017 11/02/17   Evelina Dun A, FNP  oxyCODONE-acetaminophen (PERCOCET) 7.5-325 MG tablet Take 1 tablet by mouth every 8 (eight) hours as needed for severe pain. Patient not taking: Reported on 12/01/2017 10/13/17   [name], FNP  quinapril (ACCUPRIL) 40 MG tablet TAKE 1 TABLET BY MOUTH EVERY DAY Patient not taking: Reported on 12/01/2017 11/04/17   [name], FNP    Physical Exam: Vitals:   12/01/17 1630 12/01/17 1700 12/01/17 1730 12/01/17 1800  BP: (!) 143/94 136/74 118/74 (!) 129/97  Pulse: 71 65 62 72  Resp: _0 (!) 21  Temp:      TempSrc:      SpO2: 97% 96% 100% 97%      Constitutional: NAD, calm, comfortable, morbidly obese on 3 L nasal cannula Eyes: PERRL, lids and conjunctivae normal ENMT: Mucous membranes are moist. Posterior pharynx clear of any exudate or lesions.Normal dentition.  Neck: normal, supple, no masses, no thyromegaly Respiratory: clear to auscultation bilaterally, no wheezing, no crackles. Normal respiratory effort. No accessory muscle use.  Cardiovascular: Regular rate and rhythm, no murmurs / rubs / gallops.  Bilateral lower extremity 2+ pitting edema. 2+ pedal pulses. No carotid bruits.   Abdomen: no tenderness, no masses palpated. No hepatosplenomegaly. Bowel sounds positive.  Musculoskeletal: no clubbing / cyanosis. No joint deformity upper and lower extremities. Good ROM, no contractures. Normal muscle tone.  Skin: no rashes, lesions, ulcers. No induration Neurologic: CN 2-12 grossly intact. Sensation intact, DTR normal. Strength 5/5 in all 4.  Psychiatric: Normal judgment and insight. Alert and oriented x 3. Normal mood.     Labs on Admission: I have personally reviewed following labs and imaging studies  CBC: Recent Labs  Lab 12/01/17 1337  WBC 7.0  NEUTROABS 5.2  HGB 12.6  HCT 39.7  MCV 93.2  PLT 680   Basic Metabolic Panel: Recent Labs  Lab 12/01/17 1337  NA 141  K 4.0  CL 100*  CO2 34*  GLUCOSE 110*  BUN 11  CREATININE 0.80  CALCIUM 9.2  MG 1.7   GFR: CrCl cannot be calculated (Unknown ideal weight.). Liver Function Tests: Recent Labs  Lab 12/01/17 1337  AST 24  ALT 23  ALKPHOS 76  BILITOT 0.5  PROT 6.6  ALBUMIN 3.7   Recent Labs  Lab 12/01/17 1337  LIPASE 26   No results for input(s): AMMONIA in the last 168 hours. Coagulation Profile: No results for input(s): INR, PROTIME in the last 168 hours. Cardiac Enzymes: No results for input(s): CKTOTAL, CKMB, CKMBINDEX, TROPONINI in the last 168 hours. BNP (last 3 results) No results for input(s): PROBNP in the last 8760 hours. HbA1C: No results for input(s): HGBA1C in the last 72 hours. CBG: No results for input(s): GLUCAP in the last 168 hours. Lipid Profile: No results for input(s): CHOL, HDL, LDLCALC, TRIG, CHOLHDL, LDLDIRECT in the last 72 hours. Thyroid Function Tests: No results for input(s): TSH, T4TOTAL, FREET4, T3FREE, THYROIDAB in the last 72 hours. Anemia Panel: No results for input(s): VITAMINB12, FOLATE, FERRITIN, TIBC, IRON, RETICCTPCT in the last 72 hours. Urine analysis:    Component Value Date/Time   COLORURINE COLORLESS (A) 12/01/2017 1247   APPEARANCEUR  CLEAR 12/01/2017 1247   APPEARANCEUR Clear 09/18/2016 1627   LABSPEC 1.004 (L) 12/01/2017 1247   PHURINE 8.0 12/01/2017 1247   GLUCOSEU NEGATIVE 12/01/2017 1247   HGBUR NEGATIVE 12/01/2017 1247   BILIRUBINUR NEGATIVE 12/01/2017 1247   BILIRUBINUR Negative 09/18/2016 1627   KETONESUR NEGATIVE 12/01/2017 1247   PROTEINUR NEGATIVE 12/01/2017 1247   UROBILINOGEN 0.2 05/09/2015 1033   NITRITE NEGATIVE 12/01/2017 1247   LEUKOCYTESUR NEGATIVE 12/01/2017 1247   LEUKOCYTESUR Negative 09/18/2016 1627   Sepsis Labs: !!!!!!!!!!!!!!!!!!!!!!!!!!!!!!!!!!!!!!!!!!!! _1 (procalcitonin:4,lacticidven:4) )No results found for this  or any previous visit (from the past 240 hour(s)).   Radiological Exams on Admission: Dg Chest 2 View  Result Date: 12/01/2017 CLINICAL DATA:  Cough, chills, hypotension, intermittent bradycardia, 3 syncopal episodes. EXAM: CHEST - 2 VIEW COMPARISON:  Chest x-ray of September 15, 2017 FINDINGS: The lungs are adequately inflated. The interstitial markings are coarse though stable. There is no alveolar infiltrate or pleural effusion. The cardiac silhouette is enlarged. There is calcification in the wall of the aortic arch. The central pulmonary vascularity is prominent. There is multilevel degenerative disc disease of the thoracic spine with nerve stimulator electrode at approximately the T9-T10 level. IMPRESSION: Chronic bronchitic changes, stable. No acute pneumonia. Stable cardiomegaly. Mild central pulmonary vascular congestion suggests low-grade cardiac decompensation. Thoracic aortic atherosclerosis. Electronically Signed   By: David  Martinique M.D.   On: 12/01/2017 13:19   Ct Head Wo Contrast  Result Date: 12/01/2017 CLINICAL DATA:  Dizziness and headaches.  Near syncope. EXAM: CT HEAD WITHOUT CONTRAST TECHNIQUE: Contiguous axial images were obtained from the base of the skull through the vertex without intravenous contrast. COMPARISON:  September 15, 2017 FINDINGS: Brain: No  subdural, epidural, or subarachnoid hemorrhage. Cerebellum, brainstem, and basal cisterns are normal. Ventricles and sulci are unremarkable. Scattered white matter changes are stable with no acute cortical ischemia or infarct. No mass effect or midline shift. Vascular: Calcified atherosclerosis in the intracranial carotids. Skull: Normal. Negative for fracture or focal lesion. Sinuses/Orbits: No acute finding. Other: None. IMPRESSION: No acute intracranial abnormalities identified to explain the patient's symptoms. Electronically Signed   By: Dorise Bullion III M.D   On: 12/01/2017 13:50   Dg Foot Complete Left  Result Date: 12/01/2017 CLINICAL DATA:  Fall yesterday. Bruising proximal to the fourth and fifth digits. EXAM: LEFT FOOT - COMPLETE 3+ VIEW COMPARISON:  None. FINDINGS: Osteopenia. Degenerative changes. Evaluation of the third through fifth toes is limited due to flexion. Irregularity at the proximal interphalangeal joint of the fifth toe is favored to be degenerative. There is a slight offset between the base of the fourth metatarsal and cuboid on the oblique image. No fractures seen in this location or otherwise. Soft tissue swelling along the top of the foot. No other acute abnormalities. IMPRESSION: 1. No definitive fracture identified. Degenerative changes as above. 2. Soft tissue swelling along the top of the foot. 3. Small offset between the base of the fourth metatarsal and cuboid. This finding could be seen with a Lisfranc injury. However, given history, I suspect the finding may not be acute. Recommend clinical correlation. Electronically Signed   By: Dorise Bullion III M.D   On: 12/01/2017 16:13     All images have been reviewed by me personally.  EKG: Independently reviewed. NSR  Assessment/Plan Active Problems:   Syncope    Dizziness/syncope and collapse, likely vasovagal versus autonomic in nature - Admit the patient for observation - CT of the head is negative.  Previously  back in February 2019 she had an extensive evaluation with echocardiogram which showed ejection fraction 60 to 96%, grade 1 diastolic dysfunction.  Carotid artery Dopplers showing 1-49% stenosis bilaterally. - Her dizziness is intermittent and somewhat positional in nature.  Likely related to volume status versus vasovagal and autonomic.  Unlikely this is stroke related.  If necessary we can get the MRI but I will hold off on it -We will check B12, folate, TSH and cortisol a.m. -Hold off on gabapentin as it may contribute to her dizziness - Does not appear to be  fluid overloaded.  Currently on 3 L nasal cannula which is her baseline.  Hold off on giving her Lasix as due to concerns of hyper tension and no overt signs of hypervolemia  Left leg pain -X-ray shows soft tissue swelling on the top of the foot with small offset between the base of fourth metatarsal and cuboid.  CT of the left foot has been ordered.  Hypotension and bradycardia -This is resolved.  Apparently this was prior to to her admission by EMS?.  No record of this in the ER.  History of coronary artery disease - Continue home regimen at this time.  Echocardiogram form February 2019 shows ejection fraction 60 to 03%, grade 1 diastolic dysfunction.  We will continue home dose of Lasix 20 mg daily  Chronic diastolic congestive heart failure, grade 1 diastolic dysfunction - Even though she is slightly volume overloaded, does not appear to be very short of breath.  She does have lower extremity edema but on baseline 3 L nasal cannula.  Due to concerns of hypertension I will hold off on given aggressive IV Lasix for now.  Hypothyroidism -Continue home dose of Synthroid.  Check TSH  Essential hypertension - Continue home regimen  Atrial fibrillation -Likely paroxysmal.  She is only on aspirin at home.  Currently in normal sinus rhythm  Diabetes mellitus type 2, diet controlled -We will check hemoglobin A1c.  If necessary we will  place her on insulin sliding scale and Accu-Cheks  History of depression -Continue Zoloft  DVT prophylaxis: SCDs with early ambulation Code Status: DO NOT RESUSCITATE Family Communication: Granddaughter at bedside Disposition Plan: Likely discharge next 24 hours Consults called: None Admission status: Observation telemetry   Time Spent: 65 minutes.  >50% of the time was devoted to discussing the patients care, assessment, plan and disposition with other care givers along with counseling the patient about the risks and benefits of treatment.   Please Note: This patient record was dictated using Editor, commissioning. Chart creation errors have been sought, but may not always have been located. Such creation errors do not reflect on the Standard of Medical Care.   Ashden Sonnenberg Arsenio Loader MD [facility] Pager [phone]  If 7PM-7AM, please contact night-coverage [email or web address] Password [facility]  12/01/2017, 6:24 PM

## 2017-12-01 NOTE — ED Notes (Signed)
Pt requesting anxiety meds and pain meds from home meds.  Dr Tegeler notified

## 2017-12-01 NOTE — ED Provider Notes (Signed)
[facility] EMERGENCY DEPARTMENT Provider Note   CSN: [id number] Arrival date & time: 12/01/17  1146     History   Chief Complaint No chief complaint on file.   HPI Shirley Leblanc is a 72 y.o. female.  The history is provided by the patient, a relative and medical records.  Loss of Consciousness   This is a new problem. The current episode started yesterday. The problem occurs daily. The problem has not changed since onset.She lost consciousness for a period of less than one minute. The problem is associated with normal activity. Associated symptoms include light-headedness and malaise/fatigue. Pertinent negatives include abdominal pain, back pain, bowel incontinence, chest pain, confusion, congestion, diaphoresis, fever, focal weakness, headaches, nausea, palpitations, seizures, vertigo, visual change, vomiting and weakness. She has tried nothing for the symptoms. Her past medical history is significant for CAD and HTN.    Past Medical History:  Diagnosis Date  . Bursitis   . CAD (coronary artery disease)    a. nonobstructive CAD with 40% Prox LAD stenosis by cath in 2007  . Depression   . DJD (degenerative joint disease)   . GERD (gastroesophageal reflux disease)   . Hyperlipemia   . Hypertension   . Left wrist fracture   . Obesities, morbid ([place])   . Open left ankle fracture   . Urinary incontinence     Patient Active Problem List   Diagnosis Date Noted  . Dizziness   . Tremor 09/18/2017  . Encephalopathy   . Tremors of nervous system   . Near syncope 09/16/2017  . Syncope 09/15/2017  . CAD (coronary artery disease) 09/15/2017  . At [facility] for fall 05/15/2017  . Chest pain 04/28/2017  . Osteopenia 02/02/2017  . Morbid obesity ([place]) 11/13/2016  . Elevated troponin 11/13/2016  . Chronic back pain 10/10/2016  . Pain medication agreement signed 10/10/2016  . Opioid dependence ([place]) 10/10/2016  . Diabetes ([place]) 09/19/2016  . Depression  04/18/2016  . Hypothyroid 02/22/2016  . Hyperlipidemia associated with type 2 diabetes mellitus ([place]) 02/22/2016  . Chronic anticoagulation - Eliquis, CHADS2VASC=3 06/05/2015  . Atrial fibrillation ([place]) 05/16/2015  . S/P laparoscopic cholecystectomy   . Hypertension associated with diabetes ([place]) 05/09/2015  . Dyspnea on exertion 03/14/2011    Past Surgical History:  Procedure Laterality Date  . CARDIAC CATHETERIZATION    . CHOLECYSTECTOMY N/A 05/14/2015   Procedure: LAPAROSCOPIC CHOLECYSTECTOMY;  Surgeon: Coralie Keens, MD;  Location: [facility];  Service: General;  Laterality: N/A;  . NECK SURGERY    . TOTAL KNEE ARTHROPLASTY     x2  . VAGINAL HYSTERECTOMY       OB History   None      Home Medications    Prior to Admission medications   Medication Sig Start Date End Date Taking? Authorizing Provider  albuterol (PROVENTIL HFA;VENTOLIN HFA) 108 (90 Base) MCG/ACT inhaler TAKE 2 PUFFS BY MOUTH EVERY 6 HOURS AS NEEDED FOR WHEEZE OR SHORTNESS OF BREATH 08/07/17   Evelina Dun A, FNP  aspirin 81 MG EC tablet Take 81 mg by mouth daily.      [provider]  atorvastatin (LIPITOR) 10 MG tablet TAKE 1 TABLET BY MOUTH EVERY DAY 11/04/17   Evelina Dun A, FNP  blood glucose meter kit and supplies KIT Dispense per insurance preference. Use up to four times daily . E 11.9 09/30/16   Hawks, Christy A, FNP  Cholecalciferol (VITAMIN D3) 2000 UNITS TABS Take 2,000 Units by mouth daily.  [provider]  clonazePAM (KLONOPIN) 0.5 MG tablet Take 1 tablet (0.5 mg total) by mouth 2 (two) times daily as needed for anxiety. 10/12/17   [name], FNP  furosemide (LASIX) 20 MG tablet Take 1 tablet (20 mg total) by mouth daily. 11/03/17   [name], FNP  gabapentin (NEURONTIN) 100 MG capsule Take 1 capsule (100 mg total) by mouth 3 (three) times daily. 11/26/17   Evelina Dun A, FNP  glucose blood (GLUCOSE METER TEST) test strip Use BID 09/29/16   Evelina Dun A, FNP    hydrOXYzine (ATARAX/VISTARIL) 50 MG tablet TAKE 1 TABLET (50 MG TOTAL) BY MOUTH 3 (THREE) TIMES DAILY AS NEEDED. 11/02/17   Evelina Dun A, FNP  levothyroxine (SYNTHROID, LEVOTHROID) 50 MCG tablet TAKE 1 TABLET (50 MCG TOTAL) BY MOUTH DAILY. 05/04/17   Hassell Done, Mary-Margaret, FNP  meclizine (ANTIVERT) 25 MG tablet Take 1 tablet (25 mg total) by mouth 3 (three) times daily as needed for dizziness. 10/16/17   Evelina Dun A, FNP  nystatin (MYCOSTATIN/NYSTOP) powder Apply topically 4 (four) times daily. 11/22/17   [name], FNP  oxyCODONE-acetaminophen (PERCOCET) 7.5-325 MG tablet Take 1 tablet by mouth every 8 (eight) hours as needed for severe pain. 10/13/17   [name], FNP  quinapril (ACCUPRIL) 40 MG tablet Take 1 tablet (40 mg total) by mouth daily. 11/03/17   [name], FNP  quinapril (ACCUPRIL) 40 MG tablet TAKE 1 TABLET BY MOUTH EVERY DAY 11/04/17   Evelina Dun A, FNP  sertraline (ZOLOFT) 100 MG tablet Take 1 tablet (100 mg total) by mouth daily. 06/23/17 12/20/17  [name], FNP    Family History Family History  Problem Relation Age of Onset  . Lung disease Mother   . Heart attack Father   . Lung disease Unknown   . Heart attack Unknown   . Cancer Daughter        breast    Social History Social History   Tobacco Use  . Smoking status: Never Smoker  . Smokeless tobacco: Never Used  Substance Use Topics  . Alcohol use: No  . Drug use: No     Allergies   Contrast media [iodinated diagnostic agents]; Penicillins; and Sulfa antibiotics   Review of Systems Review of Systems  Constitutional: Positive for fatigue and malaise/fatigue. Negative for chills, diaphoresis and fever.  HENT: Negative for congestion and rhinorrhea.   Eyes: Negative for visual disturbance.  Respiratory: Positive for cough and shortness of breath. Negative for chest tightness and wheezing.   Cardiovascular: Positive for leg swelling and syncope. Negative for chest pain and  palpitations.  Gastrointestinal: Negative for abdominal pain, bowel incontinence, constipation, diarrhea, nausea and vomiting.  Genitourinary: Positive for frequency. Negative for dysuria and flank pain.  Musculoskeletal: Negative for back pain, joint swelling, myalgias, neck pain and neck stiffness.  Neurological: Positive for light-headedness. Negative for vertigo, focal weakness, seizures, weakness, numbness and headaches.  Psychiatric/Behavioral: Negative for agitation and confusion.  All other systems reviewed and are negative.    Physical Exam Updated Vital Signs BP (!) 141/81 (BP Location: Right Arm)   Pulse 70   Temp 97.9 F (36.6 C) (Oral)   Resp 18   SpO2 99%   Physical Exam  Constitutional: She is oriented to person, place, and time. She appears well-developed and well-nourished. No distress.  HENT:  Head: Normocephalic.  Nose: Nose normal.  Mouth/Throat: Oropharynx is clear and moist. No oropharyngeal exudate.  Eyes: Pupils are equal,  round, and reactive to light. Conjunctivae and EOM are normal.  Neck: Normal range of motion. Neck supple.  Cardiovascular: Normal rate and intact distal pulses.  Murmur heard. Pulmonary/Chest: No respiratory distress. She has no wheezes. She has rales. She exhibits no tenderness.  Abdominal: Soft. She exhibits no distension. There is no tenderness.  Musculoskeletal: She exhibits edema. She exhibits no tenderness.  Neurological: She is alert and oriented to person, place, and time. She displays normal reflexes. No sensory deficit. She exhibits normal muscle tone.  Skin: Capillary refill takes less than 2 seconds. No rash noted. She is not diaphoretic. No erythema.  Psychiatric: She has a normal mood and affect.  Nursing note and vitals reviewed.    ED Treatments / Results  Labs (all labs ordered are listed, but only abnormal results are displayed) Labs Reviewed  COMPREHENSIVE METABOLIC PANEL - Abnormal; Notable for the following  components:      Result Value   Chloride 100 (*)    CO2 34 (*)    Glucose, Bld 110 (*)    All other components within normal limits  URINALYSIS, ROUTINE W REFLEX MICROSCOPIC - Abnormal; Notable for the following components:   Color, Urine COLORLESS (*)    Specific Gravity, Urine 1.004 (*)    All other components within normal limits  CORTISOL-AM, BLOOD - Abnormal; Notable for the following components:   Cortisol - AM 6.4 (*)    All other components within normal limits  URINE CULTURE  CBC WITH DIFFERENTIAL/PLATELET  LIPASE, BLOOD  MAGNESIUM  BRAIN NATRIURETIC PEPTIDE  HEMOGLOBIN A1C  TSH  VITAMIN B12  COMPREHENSIVE METABOLIC PANEL  CBC  FOLATE RBC  I-STAT CG4 LACTIC ACID, ED  I-STAT TROPONIN, ED  I-STAT TROPONIN, ED    EKG EKG Interpretation  Date/Time:  Tuesday Dec 01 2017 12:09:37 EDT Ventricular Rate:  67 PR Interval:  154 QRS Duration: 90 QT Interval:  422 QTC Calculation: 445 R Axis:   -17 Text Interpretation:  Normal sinus rhythm Low voltage QRS Cannot rule out Anterior infarct , age undetermined T wave abnormality, consider lateral ischemia Abnormal ECG When compared to prior, new t wave ivnersion in leads 3, AVF, V4-V6.  No STEMI Confirmed by Antony Blackbird [phone]) on 12/01/2017 1:24:28 PM   Radiology Dg Chest 2 View  Result Date: 12/01/2017 CLINICAL DATA:  Cough, chills, hypotension, intermittent bradycardia, 3 syncopal episodes. EXAM: CHEST - 2 VIEW COMPARISON:  Chest x-ray of September 15, 2017 FINDINGS: The lungs are adequately inflated. The interstitial markings are coarse though stable. There is no alveolar infiltrate or pleural effusion. The cardiac silhouette is enlarged. There is calcification in the wall of the aortic arch. The central pulmonary vascularity is prominent. There is multilevel degenerative disc disease of the thoracic spine with nerve stimulator electrode at approximately the T9-T10 level. IMPRESSION: Chronic bronchitic changes, stable. No  acute pneumonia. Stable cardiomegaly. Mild central pulmonary vascular congestion suggests low-grade cardiac decompensation. Thoracic aortic atherosclerosis. Electronically Signed   By: David  Martinique M.D.   On: 12/01/2017 13:19   Ct Head Wo Contrast  Result Date: 12/01/2017 CLINICAL DATA:  Dizziness and headaches.  Near syncope. EXAM: CT HEAD WITHOUT CONTRAST TECHNIQUE: Contiguous axial images were obtained from the base of the skull through the vertex without intravenous contrast. COMPARISON:  September 15, 2017 FINDINGS: Brain: No subdural, epidural, or subarachnoid hemorrhage. Cerebellum, brainstem, and basal cisterns are normal. Ventricles and sulci are unremarkable. Scattered white matter changes are stable with no acute cortical ischemia or infarct. No  mass effect or midline shift. Vascular: Calcified atherosclerosis in the intracranial carotids. Skull: Normal. Negative for fracture or focal lesion. Sinuses/Orbits: No acute finding. Other: None. IMPRESSION: No acute intracranial abnormalities identified to explain the patient's symptoms. Electronically Signed   By: Dorise Bullion III M.D   On: 12/01/2017 13:50   Ct Foot Left Wo Contrast  Result Date: 12/01/2017 CLINICAL DATA:  Multiple falls last evening complaining of left toe pain. EXAM: CT OF THE LEFT FOOT WITHOUT CONTRAST TECHNIQUE: Multidetector CT imaging of the left foot was performed according to the standard protocol. Multiplanar CT image reconstructions were also generated. COMPARISON:  5719 foot radiographs FINDINGS: Bones/Joint/Cartilage Osteoarthritis of the PIP and DIP joints of the second through fifth digits, interphalangeal joint of the great toe, first through fifth TMT joints as well as midfoot articulations. Diffuse joint space narrowing and subchondral cystic change of the midfoot articulations are identified. Osteoarthritis of the tibiotalar and subtalar joints is also noted with joint space narrowing and subchondral sclerosis of the  talar dome and articulating portions of the calcaneus. Calcaneal enthesopathy is noted along the plantar and dorsal aspect. No aggressive osseous lesions. Ligaments Suboptimally assessed by CT. Muscles and Tendons Generalized flexor and extensor muscle atrophy. No tenosynovitis or tear. Achilles tendon appears intact. Soft tissues No significant soft tissue swelling. Soft tissue prominence attributable to subcutaneous fat about the malleoli. IMPRESSION: 1. Osteoarthritis of the left foot as above. No acute displaced fracture, periostitis nor suspicious osseous lesions. No joint dislocation. Should symptoms persist, repeat imaging in 7-10 days radiographically may help reveal an occult fracture. 2. Plantar and dorsal calcaneal enthesopathy. Electronically Signed   By: Ashley Royalty M.D.   On: 12/01/2017 20:23   Dg Foot Complete Left  Result Date: 12/01/2017 CLINICAL DATA:  Fall yesterday. Bruising proximal to the fourth and fifth digits. EXAM: LEFT FOOT - COMPLETE 3+ VIEW COMPARISON:  None. FINDINGS: Osteopenia. Degenerative changes. Evaluation of the third through fifth toes is limited due to flexion. Irregularity at the proximal interphalangeal joint of the fifth toe is favored to be degenerative. There is a slight offset between the base of the fourth metatarsal and cuboid on the oblique image. No fractures seen in this location or otherwise. Soft tissue swelling along the top of the foot. No other acute abnormalities. IMPRESSION: 1. No definitive fracture identified. Degenerative changes as above. 2. Soft tissue swelling along the top of the foot. 3. Small offset between the base of the fourth metatarsal and cuboid. This finding could be seen with a Lisfranc injury. However, given history, I suspect the finding may not be acute. Recommend clinical correlation. Electronically Signed   By: Dorise Bullion III M.D   On: 12/01/2017 16:13    Procedures Procedures (including critical care time)  Medications  Ordered in ED Medications  aspirin EC tablet 81 mg (has no administration in time range)  cholecalciferol (VITAMIN D) tablet 2,000 Units (has no administration in time range)  levothyroxine (SYNTHROID, LEVOTHROID) tablet 50 mcg (has no administration in time range)  sertraline (ZOLOFT) tablet 100 mg (has no administration in time range)  albuterol (PROVENTIL) (2.5 MG/3ML) 0.083% nebulizer solution 2.5 mg (has no administration in time range)  clonazePAM (KLONOPIN) tablet 0.5 mg (has no administration in time range)  meclizine (ANTIVERT) tablet 25 mg (has no administration in time range)  furosemide (LASIX) tablet 20 mg (has no administration in time range)  quinapril (ACCUPRIL) tablet 40 mg (has no administration in time range)  atorvastatin (LIPITOR) tablet 10  mg (has no administration in time range)  acetaminophen (TYLENOL) tablet 650 mg (has no administration in time range)    Or  acetaminophen (TYLENOL) suppository 650 mg (has no administration in time range)  oxyCODONE-acetaminophen (PERCOCET/ROXICET) 5-325 MG per tablet 1 tablet (has no administration in time range)    And  oxyCODONE (Oxy IR/ROXICODONE) immediate release tablet 5 mg (has no administration in time range)  oxyCODONE-acetaminophen (PERCOCET/ROXICET) 5-325 MG per tablet 1 tablet (1 tablet Oral Given 12/01/17 1708)  clonazePAM (KLONOPIN) tablet 0.5 mg (0.5 mg Oral Given 12/01/17 1708)     Initial Impression / Assessment and Plan / ED Course  I have reviewed the triage vital signs and the nursing notes.  Pertinent labs & imaging results that were available during my care of the patient were reviewed by me and considered in my medical decision making (see chart for details).     Shirley Leblanc is a 72 y.o. female with a past medical history significant for hospice care for CHF, CAD, GERD, hypertension, and hyperlipidemia who presents with 2 days of diarrhea, cough, increase in bilateral lower extremity edema, and 3 syncopal  episodes in the last 2 days.  According to EMS report to nursing, patient was hypotensive during transport with blood pressure in the 80s.  Patient's coming by family reports that over the last 2 days, she has had 3 syncopal episodes.  The one today was when she was transferring from the bathroom to a chair and lost consciousness and passed out.  She has not been eating and drinking as much and is had the 2 days of nonbloody diarrhea.  She denies any fevers or chills but does report a chronic cough.  She reports some mild shortness of breath and says that her legs are more swollen than normal on both legs.  She reports the swelling in the legs is new today.  She denies any chest pain or palpitations however upon arrival to the emergency department her heart rate was dipping into the 40s on telemetry.  Patient was feeling somewhat lightheaded during these episodes.  Patient has had no rectal bleeding by report.  Patient reported left-sided headache after the fall and hitting her head today.  She reports her headache is improving.  She denies any other neurologic concerns.  On exam, patient has crackles in both lungs.  Patient has mild edema in the legs which she reports is worsening.  Patient had heart rate in the 70s however on telemetry was noted to go into the 40s.  Patient's abdomen was nontender.  Mild murmur appreciated.  No focal neurologic deficit seen.  Based on patient's description of symptoms I am concerned about symptomatic bradycardia causing the patient to have syncopal episodes.  I am also concerned about the report of shortness of breath, cough, and the leg edema concerning for fluid overload.  I am also concerned about the fluid shifts involved with the patient's diarrhea contributing to some hypotension.  CT of the head is performed given the head trauma showing no acute intracranial abnormalities.  Chest x-ray shows concern for stable cardiomegaly however there was central pulmonary vascular  congestion suggesting a cardiac decompensation.  Urinalysis showed no UTI and other laboratory testing was overall reassuring.  BNP still in process.  Initial troponin negative.  No leukocytosis or anemia.    EKG showed new T wave inversions.  Suspect patient will require admission due to the multiple syncopal episodes in the setting of hypotension and concern for CHF  exacerbation.  3:38 PM Patient was reassessed and she was poor she was having some left foot pain.  She thinks he twisted it when she syncopized today.    X-ray will be obtained of this.    Patient's BNP returned 85.5 which is more elevated than prior but not significantly elevated.  Patient is overweight so this could be falsely low.  Clinically I am concerned about fluid overload with a edematous legs, shortness of breath, crackles on lungs, and x-ray showing vascular congestion.  Family reports that she was admitted recently and they thought "she was going to make it" because she had so much fluid on.  I also concerned about the patient's intermittent bradycardia and the multiple syncopal episodes with the hypotension with EMS.    After x-rays were obtained of the foot, patient will need admission for the high risk syncope with hypotension and new T wave inversions as well as concern for fluid overload.   Final Clinical Impressions(s) / ED Diagnoses   Final diagnoses:  Syncope, unspecified syncope type    ED Discharge Orders    None      Clinical Impression: 1. Syncope, unspecified syncope type     Disposition: Admit  This note was prepared with assistance of Dragon voice recognition software. Occasional wrong-word or sound-a-like substitutions may have occurred due to the inherent limitations of voice recognition software.     Alexavier Tsutsui, Gwenyth Allegra, MD 12/01/17 [phone]

## 2017-12-01 NOTE — ED Triage Notes (Signed)
Pt arrives via [facility] with reports of witnessed syncopal event transitioning from bedside commode to recliner. Pt with soft BP 92/60. Pt with complaints of diarrhea X2 days. Pt pale upon arrival. Last BP 88/50, HR 80, 98% 3L (wears continuously).

## 2017-12-01 NOTE — ED Notes (Signed)
Patient transported to CT 

## 2017-12-02 ENCOUNTER — Encounter (HOSPITAL_COMMUNITY): Payer: Self-pay | Admitting: General Practice

## 2017-12-02 ENCOUNTER — Other Ambulatory Visit: Payer: Self-pay

## 2017-12-02 DIAGNOSIS — I6523 Occlusion and stenosis of bilateral carotid arteries: Secondary | ICD-10-CM | POA: Diagnosis not present

## 2017-12-02 DIAGNOSIS — R55 Syncope and collapse: Secondary | ICD-10-CM | POA: Diagnosis not present

## 2017-12-02 LAB — COMPREHENSIVE METABOLIC PANEL
ALBUMIN: 3.3 g/dL — AB (ref 3.5–5.0)
ALT: 23 U/L (ref 14–54)
AST: 22 U/L (ref 15–41)
Alkaline Phosphatase: 67 U/L (ref 38–126)
Anion gap: 6 (ref 5–15)
BUN: 12 mg/dL (ref 6–20)
CHLORIDE: 102 mmol/L (ref 101–111)
CO2: 34 mmol/L — AB (ref 22–32)
CREATININE: 0.81 mg/dL (ref 0.44–1.00)
Calcium: 9.2 mg/dL (ref 8.9–10.3)
GFR calc Af Amer: >60 mL/min (ref >60)
GLUCOSE: 118 mg/dL — AB (ref 65–99)
Potassium: 3.9 mmol/L (ref 3.5–5.1)
SODIUM: 142 mmol/L (ref 135–145)
Total Bilirubin: 0.5 mg/dL (ref 0.3–1.2)
Total Protein: 5.9 g/dL — ABNORMAL LOW (ref 6.5–8.1)

## 2017-12-02 LAB — GASTROINTESTINAL PANEL BY PCR, STOOL (REPLACES STOOL CULTURE)
ADENOVIRUS F40/41: NOT DETECTED
ASTROVIRUS: NOT DETECTED
CAMPYLOBACTER SPECIES: NOT DETECTED
CRYPTOSPORIDIUM: NOT DETECTED
CYCLOSPORA CAYETANENSIS: NOT DETECTED
ENTEROPATHOGENIC E COLI (EPEC): NOT DETECTED
ENTEROTOXIGENIC E COLI (ETEC): NOT DETECTED
Entamoeba histolytica: NOT DETECTED
Enteroaggregative E coli (EAEC): NOT DETECTED
Giardia lamblia: NOT DETECTED
Norovirus GI/GII: NOT DETECTED
PLESIMONAS SHIGELLOIDES: NOT DETECTED
ROTAVIRUS A: NOT DETECTED
SAPOVIRUS (I, II, IV, AND V): NOT DETECTED
SHIGA LIKE TOXIN PRODUCING E COLI (STEC): NOT DETECTED
Salmonella species: NOT DETECTED
Shigella/Enteroinvasive E coli (EIEC): NOT DETECTED
VIBRIO SPECIES: NOT DETECTED
Vibrio cholerae: NOT DETECTED
YERSINIA ENTEROCOLITICA: NOT DETECTED

## 2017-12-02 LAB — CBC
HEMATOCRIT: 37.1 % (ref 36.0–46.0)
Hemoglobin: 11.8 g/dL — ABNORMAL LOW (ref 12.0–15.0)
MCH: 29.7 pg (ref 26.0–34.0)
MCHC: 31.8 g/dL (ref 30.0–36.0)
MCV: 93.5 fL (ref 78.0–100.0)
PLATELETS: 154 10*3/uL (ref 150–400)
RBC: 3.97 MIL/uL (ref 3.87–5.11)
RDW: 13.6 % (ref 11.5–15.5)
WBC: 7.6 10*3/uL (ref 4.0–10.5)

## 2017-12-02 LAB — FOLATE RBC
Folate, Hemolysate: 358.5 ng/mL
Folate, RBC: 910 ng/mL (ref >498)
Hematocrit: 39.4 % (ref 34.0–46.6)

## 2017-12-02 LAB — URINE CULTURE

## 2017-12-02 LAB — C DIFFICILE QUICK SCREEN W PCR REFLEX
C DIFFICILE (CDIFF) INTERP: NOT DETECTED
C Diff antigen: NEGATIVE
C Diff toxin: NEGATIVE

## 2017-12-02 MED ORDER — ORAL CARE MOUTH RINSE
15.0000 mL | Freq: Two times a day (BID) | OROMUCOSAL | Status: DC
  Administered 2017-12-02 (×2): 15 mL via OROMUCOSAL

## 2017-12-02 MED ORDER — ONDANSETRON HCL 4 MG PO TABS
4.0000 mg | ORAL_TABLET | Freq: Three times a day (TID) | ORAL | Status: DC | PRN
  Administered 2017-12-02: 4 mg via ORAL
  Filled 2017-12-02: qty 1

## 2017-12-02 NOTE — Progress Notes (Signed)
PROGRESS NOTE    Shirley Leblanc  RSW:[id number] DOB: 04/04/1946 DOA: 12/01/2017 PCP: [name], FNP   Brief Narrative: Patient is a 72 year old female with past medical history of chronic diastolic CHF, hypertension, hypothyroidism, hyperlipidemia, depression, peripheral neuropathy who presented to the emergency department for evaluation of syncope. Patient  reported to be dizzy for the last several months.  Patient also reported to be hypotensive on presentation.  Assessment & Plan:   Active Problems:   Syncope  Dizziness/syncopal episode and collapse :Liikely vasovagal vs autonomic.She was trying to get up from commode she felt dizzy and passed out for few seconds without any seizure-like activities CT of the head was negative.Previously back in February 2019 she had an extensive evaluation with echocardiogram which showed ejection fraction 60 to 09%, grade 1 diastolic dysfunction.  Carotid artery Dopplers showing 1-49% stenosis bilaterally. Vitamin B12 normal, folate pending, TSH normal.  Cortisol was slightly lower.  Blood pressure currently stable. She did well with the physical therapy who have recommended home health. Gabapentin held for dizziness.  Chronic respiratory failure: On 3 L oxygen via nasal cannula at home.  Left leg pain: CT of the left foot  did not show any osteomyelitis.  Showed osteoarthritis.  Hypotension/bradycardia: Resolved  History of coronary artery disease: Continue home regimen.  Denies any chest pain.  Chronic diastolic congestive heart failure:Echocardiogram form February 2019 shows ejection fraction 60 to 38%, grade 1 diastolic dysfunction.  We will continue home dose of Lasix 20 mg daily.  She has chronic bilateral lower extremity edema.  Hypothyroidism: Continue Synthyroid.  TSH  Normal.  Hypertension: Currently blood pressure stable.  Hypotensive on presentation.  Paroxysmal A. fib: Currently normal sinus rhythm.  Continue  aspirin.  Diabetes type 2:Hb A1c 5.4.  We will continue sliding scale insulin.  Depression: Continue Zoloft.  Diarrhea: Multiple episodes.  Will check Cdff and  GI pathogen panel   DVT prophylaxis: SCD Code Status: DNR Family Communication: None present at the bedside Disposition Plan: Likely home tomorrow with home health  Consultants: None  Procedures: None  Antimicrobials: None  Subjective: Patient seen and examined at bedside this morning.  Remains comfortable with complaints of multiple episodes of diarrhea.  Denies any abdominal pain.  Objective: Vitals:   12/02/17 0117 12/02/17 0500 12/02/17 0729 12/02/17 0825  BP: 129/79   134/72  Pulse: (!) 56   66  Resp:    18  Temp: (!) 97.5 F (36.4 C)   98.2 F (36.8 C)  TempSrc: Oral   Oral  SpO2: 99%   96%  Weight:  121.3 kg (267 lb 6.7 oz)    Height:   5\' 6"  (1.676 m)    No intake or output data in the 24 hours ending 12/02/17 1335 Filed Weights   12/02/17 0500  Weight: 121.3 kg (267 lb 6.7 oz)    Examination:  General exam: Appears calm and comfortable ,Not in distress,obese HEENT:PERRL,Oral mucosa moist, Ear/Nose normal on gross exam Respiratory system: Bilateral equal air entry, normal vesicular breath sounds, no wheezes or crackles  Cardiovascular system: S1 & S2 heard, RRR. No JVD, murmurs, rubs, gallops or clicks. 1-2+ pedal edema. Gastrointestinal system: Abdomen is nondistended, soft and nontender. No organomegaly or masses felt. Normal bowel sounds heard. Central nervous system: Alert and oriented. No focal neurological deficits. Extremities: 1-2+ edema, no clubbing ,no cyanosis, distal peripheral pulses palpable. Skin: No rashes, lesions or ulcers,no icterus ,no pallor MSK: Normal muscle bulk,tone ,power Psychiatry: Judgement and insight appear  normal. Mood & affect appropriate.     Data Reviewed: I have personally reviewed following labs and imaging studies  CBC: Recent Labs  Lab 12/01/17 1337  12/02/17 0213  WBC 7.0 7.6  NEUTROABS 5.2  --   HGB 12.6 11.8*  HCT 39.7 37.1  MCV 93.2 93.5  PLT 164 932   Basic Metabolic Panel: Recent Labs  Lab 12/01/17 1337 12/02/17 0213  NA 141 142  K 4.0 3.9  CL 100* 102  CO2 34* 34*  GLUCOSE 110* 118*  BUN 11 12  CREATININE 0.80 0.81  CALCIUM 9.2 9.2  MG 1.7  --    GFR: Estimated Creatinine Clearance: 83.4 mL/min (by C-G formula based on SCr of 0.81 mg/dL). Liver Function Tests: Recent Labs  Lab 12/01/17 1337 12/02/17 0213  AST 24 22  ALT 23 23  ALKPHOS 76 67  BILITOT 0.5 0.5  PROT 6.6 5.9*  ALBUMIN 3.7 3.3*   Recent Labs  Lab 12/01/17 1337  LIPASE 26   No results for input(s): AMMONIA in the last 168 hours. Coagulation Profile: No results for input(s): INR, PROTIME in the last 168 hours. Cardiac Enzymes: No results for input(s): CKTOTAL, CKMB, CKMBINDEX, TROPONINI in the last 168 hours. BNP (last 3 results) No results for input(s): PROBNP in the last 8760 hours. HbA1C: Recent Labs    12/01/17 1846  HGBA1C 5.4   CBG: No results for input(s): GLUCAP in the last 168 hours. Lipid Profile: No results for input(s): CHOL, HDL, LDLCALC, TRIG, CHOLHDL, LDLDIRECT in the last 72 hours. Thyroid Function Tests: Recent Labs    12/01/17 1846  TSH 1.474   Anemia Panel: Recent Labs    12/01/17 1846  VITAMINB12 239   Sepsis Labs: Recent Labs  Lab 12/01/17 1357  LATICACIDVEN 0.76    Recent Results (from the past 240 hour(s))  Urine culture     Status: Abnormal   Collection Time: 12/01/17 12:24 PM  Result Value Ref Range Status   Specimen Description URINE, CLEAN CATCH  Final   Special Requests NONE  Final   Culture (A)  Final    <10,000 COLONIES/mL INSIGNIFICANT GROWTH Performed at [facility], [place] [address] [place], [place] [address]    Report Status 12/02/2017 FINAL  Final         Radiology Studies: Dg Chest 2 View  Result Date: 12/01/2017 CLINICAL DATA:  Cough, chills, hypotension,  intermittent bradycardia, 3 syncopal episodes. EXAM: CHEST - 2 VIEW COMPARISON:  Chest x-ray of September 15, 2017 FINDINGS: The lungs are adequately inflated. The interstitial markings are coarse though stable. There is no alveolar infiltrate or pleural effusion. The cardiac silhouette is enlarged. There is calcification in the wall of the aortic arch. The central pulmonary vascularity is prominent. There is multilevel degenerative disc disease of the thoracic spine with nerve stimulator electrode at approximately the T9-T10 level. IMPRESSION: Chronic bronchitic changes, stable. No acute pneumonia. Stable cardiomegaly. Mild central pulmonary vascular congestion suggests low-grade cardiac decompensation. Thoracic aortic atherosclerosis. Electronically Signed   By: David  Martinique M.D.   On: 12/01/2017 13:19   Ct Head Wo Contrast  Result Date: 12/01/2017 CLINICAL DATA:  Dizziness and headaches.  Near syncope. EXAM: CT HEAD WITHOUT CONTRAST TECHNIQUE: Contiguous axial images were obtained from the base of the skull through the vertex without intravenous contrast. COMPARISON:  September 15, 2017 FINDINGS: Brain: No subdural, epidural, or subarachnoid hemorrhage. Cerebellum, brainstem, and basal cisterns are normal. Ventricles and sulci are unremarkable. Scattered white matter changes  are stable with no acute cortical ischemia or infarct. No mass effect or midline shift. Vascular: Calcified atherosclerosis in the intracranial carotids. Skull: Normal. Negative for fracture or focal lesion. Sinuses/Orbits: No acute finding. Other: None. IMPRESSION: No acute intracranial abnormalities identified to explain the patient's symptoms. Electronically Signed   By: Dorise Bullion III M.D   On: 12/01/2017 13:50   Ct Foot Left Wo Contrast  Result Date: 12/01/2017 CLINICAL DATA:  Multiple falls last evening complaining of left toe pain. EXAM: CT OF THE LEFT FOOT WITHOUT CONTRAST TECHNIQUE: Multidetector CT imaging of the left foot  was performed according to the standard protocol. Multiplanar CT image reconstructions were also generated. COMPARISON:  5719 foot radiographs FINDINGS: Bones/Joint/Cartilage Osteoarthritis of the PIP and DIP joints of the second through fifth digits, interphalangeal joint of the great toe, first through fifth TMT joints as well as midfoot articulations. Diffuse joint space narrowing and subchondral cystic change of the midfoot articulations are identified. Osteoarthritis of the tibiotalar and subtalar joints is also noted with joint space narrowing and subchondral sclerosis of the talar dome and articulating portions of the calcaneus. Calcaneal enthesopathy is noted along the plantar and dorsal aspect. No aggressive osseous lesions. Ligaments Suboptimally assessed by CT. Muscles and Tendons Generalized flexor and extensor muscle atrophy. No tenosynovitis or tear. Achilles tendon appears intact. Soft tissues No significant soft tissue swelling. Soft tissue prominence attributable to subcutaneous fat about the malleoli. IMPRESSION: 1. Osteoarthritis of the left foot as above. No acute displaced fracture, periostitis nor suspicious osseous lesions. No joint dislocation. Should symptoms persist, repeat imaging in 7-10 days radiographically may help reveal an occult fracture. 2. Plantar and dorsal calcaneal enthesopathy. Electronically Signed   By: Ashley Royalty M.D.   On: 12/01/2017 20:23   Dg Foot Complete Left  Result Date: 12/01/2017 CLINICAL DATA:  Fall yesterday. Bruising proximal to the fourth and fifth digits. EXAM: LEFT FOOT - COMPLETE 3+ VIEW COMPARISON:  None. FINDINGS: Osteopenia. Degenerative changes. Evaluation of the third through fifth toes is limited due to flexion. Irregularity at the proximal interphalangeal joint of the fifth toe is favored to be degenerative. There is a slight offset between the base of the fourth metatarsal and cuboid on the oblique image. No fractures seen in this location or  otherwise. Soft tissue swelling along the top of the foot. No other acute abnormalities. IMPRESSION: 1. No definitive fracture identified. Degenerative changes as above. 2. Soft tissue swelling along the top of the foot. 3. Small offset between the base of the fourth metatarsal and cuboid. This finding could be seen with a Lisfranc injury. However, given history, I suspect the finding may not be acute. Recommend clinical correlation. Electronically Signed   By: Dorise Bullion III M.D   On: 12/01/2017 16:13        Scheduled Meds: . aspirin EC  81 mg Oral Daily  . atorvastatin  10 mg Oral Daily  . cholecalciferol  2,000 Units Oral Daily  . furosemide  20 mg Oral Daily  . levothyroxine  50 mcg Oral Daily  . mouth rinse  15 mL Mouth Rinse BID  . quinapril  40 mg Oral Daily  . sertraline  100 mg Oral Daily   Continuous Infusions:   LOS: 0 days    Time spent: More than 50% of that time was spent in counseling and/or coordination of care.      Shelly Coss, MD [facility] Pager [phone]  If 7PM-7AM, please contact night-coverage [email or web address] Password [email or web address]  12/02/2017, 1:35 PM

## 2017-12-02 NOTE — Evaluation (Signed)
Physical Therapy Evaluation Patient Details Name: Shirley Leblanc MRN: [id number] DOB: 03-02-1946 Today's Date: 12/02/2017   History of Present Illness  Shirley Leblanc is a 72 y.o. female with medical history significant of chronic diastolic CHF, essential hypertension, hypothyroidism, hyperlipidemia, depression, peripheral neuropathy came to the hospital for evaluation of syncope  Clinical Impression  Pt admitted with above diagnosis. Pt currently with functional limitations due to the deficits listed below (see PT Problem List). Pt functioning near baseline. Pt c/o diarrhea. Pt will benefit from skilled PT to increase their independence and safety with mobility to allow discharge to the venue listed below.       Follow Up Recommendations Home health PT;Supervision/Assistance - 24 hour(unsure if able to receive due to being under hospice care)    Equipment Recommendations  None recommended by PT    Recommendations for Other Services       Precautions / Restrictions Precautions Precautions: Fall Precaution Comments: SOB with activity Restrictions Weight Bearing Restrictions: No      Mobility  Bed Mobility Overal bed mobility: Modified Independent             General bed mobility comments: HOB elevated (has hospital bed at home), used bed rails  Transfers Overall transfer level: Needs assistance Equipment used: Rolling walker (2 wheeled) Transfers: Sit to/from Stand Sit to Stand: Min assist;Mod assist         General transfer comment: minA for high surface height, modA for lower surface height, labored effort, rocks to use momentum  Ambulation/Gait Ambulation/Gait assistance: Museum/gallery curator (Feet): 50 Feet(x2) Assistive device: Rolling walker (2 wheeled) Gait Pattern/deviations: Step-to pattern;Decreased stride length;Trunk flexed Gait velocity: slow Gait velocity interpretation: <1.31 ft/sec, indicative of household ambulator General Gait Details:  +SOB, SpO2 >95% on 3LO2 via [place], c/o L foot pain  Stairs            Wheelchair Mobility    Modified Rankin (Stroke Patients Only)       Balance Overall balance assessment: Needs assistance Sitting-balance support: Feet supported Sitting balance-Leahy Scale: Good     Standing balance support: During functional activity;Single extremity supported Standing balance-Leahy Scale: Fair Standing balance comment: requires UE support                             Pertinent Vitals/Pain Pain Assessment: 0-10 Pain Score: 7  Pain Location: L foot Pain Descriptors / Indicators: Sharp Pain Intervention(s): Monitored during session    Home Living Family/patient expects to be discharged to:: Private residence Living Arrangements: Other (Comment)(granddaughter and her husband) Available Help at Discharge: Family;Available 24 hours/day Type of Home: Mobile home Home Access: Stairs to enter Entrance Stairs-Rails: Left Entrance Stairs-Number of Steps: 3 Home Layout: One level Home Equipment: Walker - 4 wheels;Shower seat;Grab bars - tub/shower Additional Comments: pt reports her granddaughter doesn't do anymore than she has too    Prior Function Level of Independence: Needs assistance   Gait / Transfers Assistance Needed: used RW for amb  ADL's / Homemaking Assistance Needed: hospice staff assists with dressing and bathing        Hand Dominance   Dominant Hand: Right    Extremity/Trunk Assessment   Upper Extremity Assessment Upper Extremity Assessment: Generalized weakness    Lower Extremity Assessment Lower Extremity Assessment: Generalized weakness    Cervical / Trunk Assessment Cervical / Trunk Assessment: Kyphotic  Communication   Communication: HOH  Cognition Arousal/Alertness: Awake/alert Behavior During Therapy: [facility]  for tasks assessed/performed Overall Cognitive Status: Within Functional Limits for tasks assessed                                         General Comments General comments (skin integrity, edema, etc.): assisted pt to bathroom. pt with episode of diarrhea. minA for hygiene    Exercises     Assessment/Plan    PT Assessment Patient needs continued PT services  PT Problem List Decreased strength;Cardiopulmonary status limiting activity;Decreased activity tolerance;Decreased balance;Decreased mobility       PT Treatment Interventions DME instruction;Gait training;Therapeutic activities;Functional mobility training;Stair training;Therapeutic exercise;Balance training    PT Goals (Current goals can be found in the Care Plan section)  Acute Rehab PT Goals Patient Stated Goal: stop the diarrhea PT Goal Formulation: With patient Time For Goal Achievement: 12/16/17 Potential to Achieve Goals: Good    Frequency Min 3X/week   Barriers to discharge        Co-evaluation               AM-PAC PT "6 Clicks" Daily Activity  Outcome Measure Difficulty turning over in bed (including adjusting bedclothes, sheets and blankets)?: Unable Difficulty moving from lying on back to sitting on the side of the bed? : A Little Difficulty sitting down on and standing up from a chair with arms (e.g., wheelchair, bedside commode, etc,.)?: A Lot Help needed moving to and from a bed to chair (including a wheelchair)?: A Lot Help needed walking in hospital room?: A Lot Help needed climbing 3-5 steps with a railing? : A Lot 6 Click Score: 12    End of Session Equipment Utilized During Treatment: Gait belt;Oxygen(3LO2 via [place]) Activity Tolerance: Patient limited by fatigue Patient left: in chair;with call bell/phone within reach Nurse Communication: Mobility status PT Visit Diagnosis: Unsteadiness on feet (R26.81)    Time: 6237-6283 PT Time Calculation (min) (ACUTE ONLY): 35 min   Charges:   PT Evaluation $PT Eval Moderate Complexity: 1 Mod PT Treatments $Gait Training: 8-22 mins   PT G Codes:         Kittie Plater, PT, DPT Pager #: [phone] Office #: [phone]   [place] 12/02/2017, 1:04 PM

## 2017-12-03 DIAGNOSIS — I6523 Occlusion and stenosis of bilateral carotid arteries: Secondary | ICD-10-CM | POA: Diagnosis not present

## 2017-12-03 DIAGNOSIS — R55 Syncope and collapse: Secondary | ICD-10-CM | POA: Diagnosis not present

## 2017-12-03 LAB — GLUCOSE, CAPILLARY: Glucose-Capillary: 128 mg/dL — ABNORMAL HIGH (ref 65–99)

## 2017-12-03 MED ORDER — DIPHENOXYLATE-ATROPINE 2.5-0.025 MG PO TABS: 1.0000 | ORAL_TABLET | Freq: Four times a day (QID) | ORAL | 0 refills | Status: AC | PRN

## 2017-12-03 NOTE — Care Management Note (Signed)
Case Management Note  Patient Details  Name: ROBERTA ANGELL MRN: [id number] Date of Birth: 19-Sep-1945  Subjective/Objective:    From home with granddaughter with [facility], patient is for dc today, NCM faxed dc summary to [facility], patient states she is on oxygen at home and her granddaughter will bring the oxygen tank at 6:30 today when she transports her home.                 Action/Plan: DC home with [facility].   Expected Discharge Date:  12/03/17               Expected Discharge Plan:  Home w Hospice Care  In-House Referral:     Discharge planning Services  CM Consult  Post Acute Care Choice:  Resumption of Svcs/PTA Provider, Hospice Choice offered to:     DME Arranged:    DME Agency:     HH Arranged:    HH Agency:  [facility]  Status of Service:  Completed, signed off  If discussed at [employer], dates discussed:    Additional Comments:  Zenon Mayo, RN 12/03/2017, 12:03 PM

## 2017-12-03 NOTE — Discharge Summary (Signed)
Physician Discharge Summary  Shirley Leblanc RJJ:[id number] DOB: 1946/05/17 DOA: 12/01/2017  PCP: [name], FNP  Admit date: 12/01/2017 Discharge date: 12/03/2017  Admitted From: Home Disposition:  Home  Discharge Condition:Stable CODE STATUS:DNR Diet recommendation: Heart Healthy  Brief/Interim Summary: Patient is a 72 year old female with past medical history of chronic diastolic CHF, hypertension, hypothyroidism, hyperlipidemia, depression, peripheral neuropathy who presented to the emergency department for evaluation of syncope. Patient  reported to be dizzy for the last several months.  Patient also reported to be hypotensive on presentation. Patient's hospital course remained stable.  Her dizziness improved after admission.  Her blood pressure remained stable during the hospitalization.  We  discontinued gabapentin.  Patient evaluated by physical therapy and recommended home health.  Her orthostatic vitals were negative. She has hospice set up at home. Patient is stable to be discharged today.  Following problems were addressed during her hospitalization:  Dizziness/syncopal episode and collapse :Liikely vasovagal vs autonomic.She was trying to get up from commode she felt dizzy and passed out for few seconds without any seizure-like activities CT of the head was negative.Previously back in February 2019 she had an extensive evaluation with echocardiogram which showed ejection fraction 60 to 01%, grade 1 diastolic dysfunction. Carotid artery Dopplers showing 1-49% stenosis bilaterally.No further testings offered at this time. Vitamin B12 normal, folate normal, TSH normal.  Cortisol was slightly lower.  Blood pressure currently stable and no need of further testings. She did well with the physical therapy who have recommended home health. Gabapentin held for dizziness.  Chronic respiratory failure: On 3 L oxygen via nasal cannula at home.  Left leg pain: CT of the left foot  did  not show any infectious process including osteomyelitis .Showed osteoarthritis.  Supportive care and home pain medications.  Hypotension/bradycardia: Resolved  History of coronary artery disease: Continue home regimen.  Denies any chest pain.  Chronic diastolic congestive heart failure:Echocardiogram form February 2019 shows ejection fraction 60 to 60%, grade 1 diastolic dysfunction.We will continue home dose of Lasix 20 mg daily.  She has chronic bilateral lower extremity edema.  Hypothyroidism: Continue Synthyroid.  TSH  Normal.  Hypertension: Currently blood pressure stable.  Hypotensive on presentation.  Paroxysmal A. fib: Currently normal sinus rhythm.  Continue aspirin.  Diabetes type 2:Hb A1c 5.4.  Depression: Continue Zoloft.  Diarrhea: Multiple episodes of diarrhoea noted yesterday.   Cdff and  GI pathogen panel negative.  Diarrhea resolved today.      Discharge Diagnoses:  Active Problems:   Syncope    Discharge Instructions  Discharge Instructions    Diet - low sodium heart healthy   Complete by:  As directed    Discharge instructions   Complete by:  As directed    1) Follow up with your PCP in a week. 2) Follow up with home hospice. 3) We have discontinue gabapentin.   Increase activity slowly   Complete by:  As directed      Allergies as of 12/03/2017      Reactions   Contrast Media [iodinated Diagnostic Agents] Anaphylaxis      Penicillins Hives, Itching, Rash   Has patient had a PCN reaction causing immediate rash, facial/tongue/throat swelling, SOB or lightheadedness with hypotension Yes Has patient had a PCN reaction causing severe rash involving mucus membranes or skin necrosis: Yes Has patient had a PCN reaction that required hospitalization No Has patient had a PCN reaction occurring within the last 10 years: No If all of the above answers are "  NO", then may proceed with Cephalosporin use.   Sulfa Antibiotics Hives, Itching, Rash       Medication List    STOP taking these medications   gabapentin 100 MG capsule Commonly known as:  NEURONTIN     TAKE these medications   albuterol 108 (90 Base) MCG/ACT inhaler Commonly known as:  PROVENTIL HFA;VENTOLIN HFA TAKE 2 PUFFS BY MOUTH EVERY 6 HOURS AS NEEDED FOR WHEEZE OR SHORTNESS OF BREATH   aspirin 81 MG EC tablet Take 81 mg by mouth daily.   atorvastatin 10 MG tablet Commonly known as:  LIPITOR TAKE 1 TABLET BY MOUTH EVERY DAY   blood glucose meter kit and supplies Kit Dispense per insurance preference. Use up to four times daily . E 11.9   clonazePAM 0.5 MG tablet Commonly known as:  KLONOPIN Take 1 tablet (0.5 mg total) by mouth 2 (two) times daily as needed for anxiety.   diphenoxylate-atropine 2.5-0.025 MG tablet Commonly known as:  LOMOTIL Take 1 tablet by mouth 4 (four) times daily as needed for up to 5 days for diarrhea or loose stools.   furosemide 20 MG tablet Commonly known as:  LASIX Take 1 tablet (20 mg total) by mouth daily.   glucose blood test strip Commonly known as:  GLUCOSE METER TEST Use BID   hydrOXYzine 50 MG tablet Commonly known as:  ATARAX/VISTARIL TAKE 1 TABLET (50 MG TOTAL) BY MOUTH 3 (THREE) TIMES DAILY AS NEEDED.   levothyroxine 50 MCG tablet Commonly known as:  SYNTHROID, LEVOTHROID TAKE 1 TABLET (50 MCG TOTAL) BY MOUTH DAILY.   meclizine 25 MG tablet Commonly known as:  ANTIVERT Take 1 tablet (25 mg total) by mouth 3 (three) times daily as needed for dizziness.   nystatin powder Commonly known as:  MYCOSTATIN/NYSTOP Apply topically 4 (four) times daily.   oxyCODONE-acetaminophen 10-325 MG tablet Commonly known as:  PERCOCET Take 1 tablet by mouth every 8 (eight) hours as needed.   oxyCODONE-acetaminophen 7.5-325 MG tablet Commonly known as:  PERCOCET Take 1 tablet by mouth every 8 (eight) hours as needed for severe pain.   quinapril 40 MG tablet Commonly known as:  ACCUPRIL Take 1 tablet (40 mg total) by  mouth daily.   quinapril 40 MG tablet Commonly known as:  ACCUPRIL TAKE 1 TABLET BY MOUTH EVERY DAY   sertraline 100 MG tablet Commonly known as:  ZOLOFT Take 1 tablet (100 mg total) by mouth daily.   Vitamin D3 2000 units Tabs Take 2,000 Units by mouth daily.      Follow-up Information    [name], FNP. Schedule an appointment as soon as possible for a visit in 1 week(s).   Specialty:  Family Medicine Contact information: [place] [address] [phone]          Allergies  Allergen Reactions  . Contrast Media [Iodinated Diagnostic Agents] Anaphylaxis       . Penicillins Hives, Itching and Rash    Has patient had a PCN reaction causing immediate rash, facial/tongue/throat swelling, SOB or lightheadedness with hypotension Yes Has patient had a PCN reaction causing severe rash involving mucus membranes or skin necrosis: Yes Has patient had a PCN reaction that required hospitalization No Has patient had a PCN reaction occurring within the last 10 years: No If all of the above answers are "NO", then may proceed with Cephalosporin use.   . Sulfa Antibiotics Hives, Itching and Rash    Consultations:  None   Procedures/Studies: Dg  Chest 2 View  Result Date: 12/01/2017 CLINICAL DATA:  Cough, chills, hypotension, intermittent bradycardia, 3 syncopal episodes. EXAM: CHEST - 2 VIEW COMPARISON:  Chest x-ray of September 15, 2017 FINDINGS: The lungs are adequately inflated. The interstitial markings are coarse though stable. There is no alveolar infiltrate or pleural effusion. The cardiac silhouette is enlarged. There is calcification in the wall of the aortic arch. The central pulmonary vascularity is prominent. There is multilevel degenerative disc disease of the thoracic spine with nerve stimulator electrode at approximately the T9-T10 level. IMPRESSION: Chronic bronchitic changes, stable. No acute pneumonia. Stable cardiomegaly. Mild central  pulmonary vascular congestion suggests low-grade cardiac decompensation. Thoracic aortic atherosclerosis. Electronically Signed   By: David  Martinique M.D.   On: 12/01/2017 13:19   Ct Head Wo Contrast  Result Date: 12/01/2017 CLINICAL DATA:  Dizziness and headaches.  Near syncope. EXAM: CT HEAD WITHOUT CONTRAST TECHNIQUE: Contiguous axial images were obtained from the base of the skull through the vertex without intravenous contrast. COMPARISON:  September 15, 2017 FINDINGS: Brain: No subdural, epidural, or subarachnoid hemorrhage. Cerebellum, brainstem, and basal cisterns are normal. Ventricles and sulci are unremarkable. Scattered white matter changes are stable with no acute cortical ischemia or infarct. No mass effect or midline shift. Vascular: Calcified atherosclerosis in the intracranial carotids. Skull: Normal. Negative for fracture or focal lesion. Sinuses/Orbits: No acute finding. Other: None. IMPRESSION: No acute intracranial abnormalities identified to explain the patient's symptoms. Electronically Signed   By: Dorise Bullion III M.D   On: 12/01/2017 13:50   Ct Foot Left Wo Contrast  Result Date: 12/01/2017 CLINICAL DATA:  Multiple falls last evening complaining of left toe pain. EXAM: CT OF THE LEFT FOOT WITHOUT CONTRAST TECHNIQUE: Multidetector CT imaging of the left foot was performed according to the standard protocol. Multiplanar CT image reconstructions were also generated. COMPARISON:  5719 foot radiographs FINDINGS: Bones/Joint/Cartilage Osteoarthritis of the PIP and DIP joints of the second through fifth digits, interphalangeal joint of the great toe, first through fifth TMT joints as well as midfoot articulations. Diffuse joint space narrowing and subchondral cystic change of the midfoot articulations are identified. Osteoarthritis of the tibiotalar and subtalar joints is also noted with joint space narrowing and subchondral sclerosis of the talar dome and articulating portions of the  calcaneus. Calcaneal enthesopathy is noted along the plantar and dorsal aspect. No aggressive osseous lesions. Ligaments Suboptimally assessed by CT. Muscles and Tendons Generalized flexor and extensor muscle atrophy. No tenosynovitis or tear. Achilles tendon appears intact. Soft tissues No significant soft tissue swelling. Soft tissue prominence attributable to subcutaneous fat about the malleoli. IMPRESSION: 1. Osteoarthritis of the left foot as above. No acute displaced fracture, periostitis nor suspicious osseous lesions. No joint dislocation. Should symptoms persist, repeat imaging in 7-10 days radiographically may help reveal an occult fracture. 2. Plantar and dorsal calcaneal enthesopathy. Electronically Signed   By: Ashley Royalty M.D.   On: 12/01/2017 20:23   Dg Foot Complete Left  Result Date: 12/01/2017 CLINICAL DATA:  Fall yesterday. Bruising proximal to the fourth and fifth digits. EXAM: LEFT FOOT - COMPLETE 3+ VIEW COMPARISON:  None. FINDINGS: Osteopenia. Degenerative changes. Evaluation of the third through fifth toes is limited due to flexion. Irregularity at the proximal interphalangeal joint of the fifth toe is favored to be degenerative. There is a slight offset between the base of the fourth metatarsal and cuboid on the oblique image. No fractures seen in this location or otherwise. Soft tissue swelling along the top of the  foot. No other acute abnormalities. IMPRESSION: 1. No definitive fracture identified. Degenerative changes as above. 2. Soft tissue swelling along the top of the foot. 3. Small offset between the base of the fourth metatarsal and cuboid. This finding could be seen with a Lisfranc injury. However, given history, I suspect the finding may not be acute. Recommend clinical correlation. Electronically Signed   By: Dorise Bullion III M.D   On: 12/01/2017 16:13      Subjective: Patient seen and examined the bedside this morning.  She was comfortable.  Diarrhea has resolved.   Stable for discharge to home.  Discharge Exam: Vitals:   12/02/17 2358 12/03/17 0805  BP: (!) 141/63 140/83  Pulse: 68 71  Resp: 19   Temp: 97.6 F (36.4 C) 97.7 F (36.5 C)  SpO2: 97% 97%   Vitals:   12/02/17 1812 12/02/17 2358 12/03/17 0500 12/03/17 0805  BP: 117/78 (!) 141/63  140/83  Pulse:  68  71  Resp:  19    Temp:  97.6 F (36.4 C)  97.7 F (36.5 C)  TempSrc:  Oral  Oral  SpO2:  97%  97%  Weight:   119.8 kg (264 lb 1.8 oz)   Height:        General: Pt is alert, awake, not in acute distress,obese Cardiovascular: RRR, S1/S2 +, no rubs, no gallops Respiratory: CTA bilaterally, no wheezing, no rhonchi Abdominal: Soft, NT, ND, bowel sounds + Extremities: trace edema, no cyanosis    The results of significant diagnostics from this hospitalization (including imaging, microbiology, ancillary and laboratory) are listed below for reference.     Microbiology: Recent Results (from the past 240 hour(s))  Urine culture     Status: Abnormal   Collection Time: 12/01/17 12:24 PM  Result Value Ref Range Status   Specimen Description URINE, CLEAN CATCH  Final   Special Requests NONE  Final   Culture (A)  Final    <10,000 COLONIES/mL INSIGNIFICANT GROWTH Performed at [facility], [address], [place], [place] [address]    Report Status 12/02/2017 FINAL  Final  C difficile quick scan w PCR reflex     Status: None   Collection Time: 12/02/17  1:33 PM  Result Value Ref Range Status   C Diff antigen NEGATIVE NEGATIVE Final   C Diff toxin NEGATIVE NEGATIVE Final   C Diff interpretation No C. difficile detected.  Final    Comment: Performed at [facility], [place] [address], [place],  [address]  Gastrointestinal Panel by PCR , Stool     Status: None   Collection Time: 12/02/17  1:34 PM  Result Value Ref Range Status   Campylobacter species NOT DETECTED NOT DETECTED Final   Plesimonas shigelloides NOT DETECTED NOT DETECTED Final   Salmonella species NOT  DETECTED NOT DETECTED Final   Yersinia enterocolitica NOT DETECTED NOT DETECTED Final   Vibrio species NOT DETECTED NOT DETECTED Final   Vibrio cholerae NOT DETECTED NOT DETECTED Final   Enteroaggregative E coli (EAEC) NOT DETECTED NOT DETECTED Final   Enteropathogenic E coli (EPEC) NOT DETECTED NOT DETECTED Final   Enterotoxigenic E coli (ETEC) NOT DETECTED NOT DETECTED Final   Shiga like toxin producing E coli (STEC) NOT DETECTED NOT DETECTED Final   Shigella/Enteroinvasive E coli (EIEC) NOT DETECTED NOT DETECTED Final   Cryptosporidium NOT DETECTED NOT DETECTED Final   Cyclospora cayetanensis NOT DETECTED NOT DETECTED Final   Entamoeba histolytica NOT DETECTED NOT DETECTED Final  Giardia lamblia NOT DETECTED NOT DETECTED Final   Adenovirus F40/41 NOT DETECTED NOT DETECTED Final   Astrovirus NOT DETECTED NOT DETECTED Final   Norovirus GI/GII NOT DETECTED NOT DETECTED Final   Rotavirus A NOT DETECTED NOT DETECTED Final   Sapovirus (I, II, IV, and V) NOT DETECTED NOT DETECTED Final    Comment: Performed at [facility], [address] [place], [place] [address]     Labs: BNP (last 3 results) Recent Labs    04/28/17 1210 12/01/17 1338  BNP 38.6 33.8   Basic Metabolic Panel: Recent Labs  Lab 12/01/17 1337 12/02/17 0213  NA 141 142  K 4.0 3.9  CL 100* 102  CO2 34* 34*  GLUCOSE 110* 118*  BUN 11 12  CREATININE 0.80 0.81  CALCIUM 9.2 9.2  MG 1.7  --    Liver Function Tests: Recent Labs  Lab 12/01/17 1337 12/02/17 0213  AST 24 22  ALT 23 23  ALKPHOS 76 67  BILITOT 0.5 0.5  PROT 6.6 5.9*  ALBUMIN 3.7 3.3*   Recent Labs  Lab 12/01/17 1337  LIPASE 26   No results for input(s): AMMONIA in the last 168 hours. CBC: Recent Labs  Lab 12/01/17 1337 12/01/17 1846 12/02/17 0213  WBC 7.0  --  7.6  NEUTROABS 5.2  --   --   HGB 12.6  --  11.8*  HCT 39.7 39.4 37.1  MCV 93.2  --  93.5  PLT 164  --  154   Cardiac Enzymes: No results for input(s):  CKTOTAL, CKMB, CKMBINDEX, TROPONINI in the last 168 hours. BNP: Invalid input(s): POCBNP CBG: Recent Labs  Lab 12/03/17 0808  GLUCAP 128*   D-Dimer No results for input(s): DDIMER in the last 72 hours. Hgb A1c Recent Labs    12/01/17 1846  HGBA1C 5.4   Lipid Profile No results for input(s): CHOL, HDL, LDLCALC, TRIG, CHOLHDL, LDLDIRECT in the last 72 hours. Thyroid function studies Recent Labs    12/01/17 1846  TSH 1.474   Anemia work up Recent Labs    12/01/17 1846  VITAMINB12 239   Urinalysis    Component Value Date/Time   COLORURINE COLORLESS (A) 12/01/2017 1247   APPEARANCEUR CLEAR 12/01/2017 1247   APPEARANCEUR Clear 09/18/2016 1627   LABSPEC 1.004 (L) 12/01/2017 1247   PHURINE 8.0 12/01/2017 1247   GLUCOSEU NEGATIVE 12/01/2017 1247   HGBUR NEGATIVE 12/01/2017 1247   BILIRUBINUR NEGATIVE 12/01/2017 1247   BILIRUBINUR Negative 09/18/2016 1627   KETONESUR NEGATIVE 12/01/2017 1247   PROTEINUR NEGATIVE 12/01/2017 1247   UROBILINOGEN 0.2 05/09/2015 1033   NITRITE NEGATIVE 12/01/2017 1247   LEUKOCYTESUR NEGATIVE 12/01/2017 1247   LEUKOCYTESUR Negative 09/18/2016 1627   Sepsis Labs Invalid input(s): PROCALCITONIN,  WBC,  LACTICIDVEN Microbiology Recent Results (from the past 240 hour(s))  Urine culture     Status: Abnormal   Collection Time: 12/01/17 12:24 PM  Result Value Ref Range Status   Specimen Description URINE, CLEAN CATCH  Final   Special Requests NONE  Final   Culture (A)  Final    <10,000 COLONIES/mL INSIGNIFICANT GROWTH Performed at [facility], [address], [place], [place] [address]    Report Status 12/02/2017 FINAL  Final  C difficile quick scan w PCR reflex     Status: None   Collection Time: 12/02/17  1:33 PM  Result Value Ref Range Status   C Diff antigen NEGATIVE NEGATIVE Final   C Diff toxin NEGATIVE NEGATIVE Final  C Diff interpretation No C. difficile detected.  Final    Comment: Performed at [facility],  [place] [address] [place], [place] [address]  Gastrointestinal Panel by PCR , Stool     Status: None   Collection Time: 12/02/17  1:34 PM  Result Value Ref Range Status   Campylobacter species NOT DETECTED NOT DETECTED Final   Plesimonas shigelloides NOT DETECTED NOT DETECTED Final   Salmonella species NOT DETECTED NOT DETECTED Final   Yersinia enterocolitica NOT DETECTED NOT DETECTED Final   Vibrio species NOT DETECTED NOT DETECTED Final   Vibrio cholerae NOT DETECTED NOT DETECTED Final   Enteroaggregative E coli (EAEC) NOT DETECTED NOT DETECTED Final   Enteropathogenic E coli (EPEC) NOT DETECTED NOT DETECTED Final   Enterotoxigenic E coli (ETEC) NOT DETECTED NOT DETECTED Final   Shiga like toxin producing E coli (STEC) NOT DETECTED NOT DETECTED Final   Shigella/Enteroinvasive E coli (EIEC) NOT DETECTED NOT DETECTED Final   Cryptosporidium NOT DETECTED NOT DETECTED Final   Cyclospora cayetanensis NOT DETECTED NOT DETECTED Final   Entamoeba histolytica NOT DETECTED NOT DETECTED Final   Giardia lamblia NOT DETECTED NOT DETECTED Final   Adenovirus F40/41 NOT DETECTED NOT DETECTED Final   Astrovirus NOT DETECTED NOT DETECTED Final   Norovirus GI/GII NOT DETECTED NOT DETECTED Final   Rotavirus A NOT DETECTED NOT DETECTED Final   Sapovirus (I, II, IV, and V) NOT DETECTED NOT DETECTED Final    Comment: Performed at [facility], [address], [place], [place] [address]     Time coordinating discharge: 35 minutes  SIGNED:   Shelly Coss, MD  [facility] 12/03/2017, 11:38 AM Pager [phone]  If 7PM-7AM, please contact night-coverage [email or web address] Password [email or web address]

## 2017-12-03 NOTE — Progress Notes (Signed)
Physical Therapy Treatment Patient Details Name: Shirley Leblanc MRN: [id number] DOB: Oct 28, 1945 Today's Date: 12/03/2017    History of Present Illness Pt is a 72 y.o. female with P<H significant of chronic diastolic CHF, essential HTN, hypothyroidism, depression, peripheral neuropathy came to the hospital for evaluation of syncope; found to be hypotensive. CT head negative.    PT Comments    Pt progressing with mobility. Amb 100' with RW and intermittent min guard for balance; remains limited by decreased activity tolerance, fatigue, and DOE 2/4 on 3L O2 [place]. Supervision for transfers and performing ADLs in room (toileting and hand washing). Will continue to follow acutely.    Follow Up Recommendations  Home health PT;Supervision/Assistance - 24 hour(unsure if able to receive HHPT if home with hospice)     Equipment Recommendations  None recommended by PT    Recommendations for Other Services       Precautions / Restrictions Precautions Precautions: Fall Restrictions Weight Bearing Restrictions: No    Mobility  Bed Mobility Overal bed mobility: Modified Independent             General bed mobility comments: HOB elevated (has hospital bed at home), used bed rails  Transfers Overall transfer level: Needs assistance Equipment used: Rolling walker (2 wheeled) Transfers: Sit to/from Stand Sit to Stand: Supervision         General transfer comment: Stood from bed and toilet with RW and supervision for safety. Increased time and effort; no physical assist required  Ambulation/Gait Ambulation/Gait assistance: Min guard Ambulation Distance (Feet): 100 Feet Assistive device: Rolling walker (2 wheeled) Gait Pattern/deviations: Step-through pattern;Decreased stride length;Wide base of support;Trunk flexed Gait velocity: Decreased Gait velocity interpretation: <1.31 ft/sec, indicative of household ambulator General Gait Details: Slow, controlled amb with RW and intermittent  min guard for balance; pt navigating RW well in and out of room. DOE 2/4   Marine scientist [place] (Stroke Patients Only)       Balance Overall balance assessment: Needs assistance Sitting-balance support: Feet supported Sitting balance-Leahy Scale: Good Sitting balance - Comments: Indep with pericare sitting on toilet   Standing balance support: During functional activity;Single extremity supported Standing balance-Leahy Scale: Fair Standing balance comment: Can static stand with no UE support; dynamic stability improved with BUE support                            Cognition Arousal/Alertness: Awake/alert Behavior During Therapy: WFL for tasks assessed/performed Overall Cognitive Status: Within Functional Limits for tasks assessed                                        Exercises      General Comments        Pertinent Vitals/Pain Pain Assessment: Faces Faces Pain Scale: Hurts a little bit Pain Location: RLE Pain Descriptors / Indicators: Shooting;Sore Pain Intervention(s): Monitored during session    Home Living                      Prior Function            PT Goals (current goals can now be found in the care plan section) Acute Rehab PT Goals Patient Stated Goal: Decreased pain and diarrhea PT Goal Formulation: With patient Time  For Goal Achievement: 12/16/17 Potential to Achieve Goals: Good Progress towards PT goals: Progressing toward goals    Frequency    Min 3X/week      PT Plan Current plan remains appropriate    Co-evaluation              AM-PAC PT "6 Clicks" Daily Activity  Outcome Measure  Difficulty turning over in bed (including adjusting bedclothes, sheets and blankets)?: None Difficulty moving from lying on back to sitting on the side of the bed? : None Difficulty sitting down on and standing up from a chair with arms (e.g., wheelchair, bedside  commode, etc,.)?: None Help needed moving to and from a bed to chair (including a wheelchair)?: A Little Help needed walking in hospital room?: A Little Help needed climbing 3-5 steps with a railing? : A Lot 6 Click Score: 20    End of Session Equipment Utilized During Treatment: Gait belt;Oxygen Activity Tolerance: Patient tolerated treatment well;Patient limited by fatigue Patient left: in bed;with call bell/phone within reach Nurse Communication: Mobility status PT Visit Diagnosis: Unsteadiness on feet (R26.81)     Time: 9147-8295 PT Time Calculation (min) (ACUTE ONLY): 24 min  Charges:  $Gait Training: 8-22 mins $Therapeutic Activity: 8-22 mins                    G Codes:      Mabeline Caras, PT, DPT Acute Rehab Services  Pager: [place] 12/03/2017, 11:31 AM

## 2017-12-03 NOTE — Progress Notes (Signed)
Obtained pt's orthostatic vital signs while lying down, sitting, and standing for 0 minutes. Pt too weak to stand for the 3 minute standing BP. Results documented in the flow sheet.

## 2017-12-04 DIAGNOSIS — I10 Essential (primary) hypertension: Secondary | ICD-10-CM | POA: Diagnosis not present

## 2017-12-04 DIAGNOSIS — I519 Heart disease, unspecified: Secondary | ICD-10-CM | POA: Diagnosis not present

## 2017-12-04 DIAGNOSIS — I4891 Unspecified atrial fibrillation: Secondary | ICD-10-CM | POA: Diagnosis not present

## 2017-12-04 DIAGNOSIS — E785 Hyperlipidemia, unspecified: Secondary | ICD-10-CM | POA: Diagnosis not present

## 2017-12-04 DIAGNOSIS — E039 Hypothyroidism, unspecified: Secondary | ICD-10-CM | POA: Diagnosis not present

## 2017-12-04 DIAGNOSIS — F330 Major depressive disorder, recurrent, mild: Secondary | ICD-10-CM | POA: Diagnosis not present

## 2017-12-07 DIAGNOSIS — I10 Essential (primary) hypertension: Secondary | ICD-10-CM | POA: Diagnosis not present

## 2017-12-07 DIAGNOSIS — I519 Heart disease, unspecified: Secondary | ICD-10-CM | POA: Diagnosis not present

## 2017-12-07 DIAGNOSIS — I4891 Unspecified atrial fibrillation: Secondary | ICD-10-CM | POA: Diagnosis not present

## 2017-12-07 DIAGNOSIS — E785 Hyperlipidemia, unspecified: Secondary | ICD-10-CM | POA: Diagnosis not present

## 2017-12-07 DIAGNOSIS — E039 Hypothyroidism, unspecified: Secondary | ICD-10-CM | POA: Diagnosis not present

## 2017-12-07 DIAGNOSIS — F330 Major depressive disorder, recurrent, mild: Secondary | ICD-10-CM | POA: Diagnosis not present

## 2017-12-09 DIAGNOSIS — I4891 Unspecified atrial fibrillation: Secondary | ICD-10-CM | POA: Diagnosis not present

## 2017-12-09 DIAGNOSIS — E039 Hypothyroidism, unspecified: Secondary | ICD-10-CM | POA: Diagnosis not present

## 2017-12-09 DIAGNOSIS — I519 Heart disease, unspecified: Secondary | ICD-10-CM | POA: Diagnosis not present

## 2017-12-09 DIAGNOSIS — F330 Major depressive disorder, recurrent, mild: Secondary | ICD-10-CM | POA: Diagnosis not present

## 2017-12-09 DIAGNOSIS — E785 Hyperlipidemia, unspecified: Secondary | ICD-10-CM | POA: Diagnosis not present

## 2017-12-09 DIAGNOSIS — I10 Essential (primary) hypertension: Secondary | ICD-10-CM | POA: Diagnosis not present

## 2017-12-11 DIAGNOSIS — E039 Hypothyroidism, unspecified: Secondary | ICD-10-CM | POA: Diagnosis not present

## 2017-12-11 DIAGNOSIS — I519 Heart disease, unspecified: Secondary | ICD-10-CM | POA: Diagnosis not present

## 2017-12-11 DIAGNOSIS — I4891 Unspecified atrial fibrillation: Secondary | ICD-10-CM | POA: Diagnosis not present

## 2017-12-11 DIAGNOSIS — I10 Essential (primary) hypertension: Secondary | ICD-10-CM | POA: Diagnosis not present

## 2017-12-11 DIAGNOSIS — E785 Hyperlipidemia, unspecified: Secondary | ICD-10-CM | POA: Diagnosis not present

## 2017-12-11 DIAGNOSIS — F330 Major depressive disorder, recurrent, mild: Secondary | ICD-10-CM | POA: Diagnosis not present

## 2017-12-14 DIAGNOSIS — I10 Essential (primary) hypertension: Secondary | ICD-10-CM | POA: Diagnosis not present

## 2017-12-14 DIAGNOSIS — I4891 Unspecified atrial fibrillation: Secondary | ICD-10-CM | POA: Diagnosis not present

## 2017-12-14 DIAGNOSIS — I519 Heart disease, unspecified: Secondary | ICD-10-CM | POA: Diagnosis not present

## 2017-12-14 DIAGNOSIS — E039 Hypothyroidism, unspecified: Secondary | ICD-10-CM | POA: Diagnosis not present

## 2017-12-14 DIAGNOSIS — E785 Hyperlipidemia, unspecified: Secondary | ICD-10-CM | POA: Diagnosis not present

## 2017-12-14 DIAGNOSIS — F330 Major depressive disorder, recurrent, mild: Secondary | ICD-10-CM | POA: Diagnosis not present

## 2017-12-15 DIAGNOSIS — I10 Essential (primary) hypertension: Secondary | ICD-10-CM | POA: Diagnosis not present

## 2017-12-15 DIAGNOSIS — E039 Hypothyroidism, unspecified: Secondary | ICD-10-CM | POA: Diagnosis not present

## 2017-12-15 DIAGNOSIS — F330 Major depressive disorder, recurrent, mild: Secondary | ICD-10-CM | POA: Diagnosis not present

## 2017-12-15 DIAGNOSIS — I4891 Unspecified atrial fibrillation: Secondary | ICD-10-CM | POA: Diagnosis not present

## 2017-12-15 DIAGNOSIS — I519 Heart disease, unspecified: Secondary | ICD-10-CM | POA: Diagnosis not present

## 2017-12-15 DIAGNOSIS — E785 Hyperlipidemia, unspecified: Secondary | ICD-10-CM | POA: Diagnosis not present

## 2017-12-16 DIAGNOSIS — E785 Hyperlipidemia, unspecified: Secondary | ICD-10-CM | POA: Diagnosis not present

## 2017-12-16 DIAGNOSIS — I10 Essential (primary) hypertension: Secondary | ICD-10-CM | POA: Diagnosis not present

## 2017-12-16 DIAGNOSIS — F330 Major depressive disorder, recurrent, mild: Secondary | ICD-10-CM | POA: Diagnosis not present

## 2017-12-16 DIAGNOSIS — E039 Hypothyroidism, unspecified: Secondary | ICD-10-CM | POA: Diagnosis not present

## 2017-12-16 DIAGNOSIS — I4891 Unspecified atrial fibrillation: Secondary | ICD-10-CM | POA: Diagnosis not present

## 2017-12-16 DIAGNOSIS — I519 Heart disease, unspecified: Secondary | ICD-10-CM | POA: Diagnosis not present

## 2017-12-18 DIAGNOSIS — E785 Hyperlipidemia, unspecified: Secondary | ICD-10-CM | POA: Diagnosis not present

## 2017-12-18 DIAGNOSIS — F330 Major depressive disorder, recurrent, mild: Secondary | ICD-10-CM | POA: Diagnosis not present

## 2017-12-18 DIAGNOSIS — I519 Heart disease, unspecified: Secondary | ICD-10-CM | POA: Diagnosis not present

## 2017-12-18 DIAGNOSIS — E039 Hypothyroidism, unspecified: Secondary | ICD-10-CM | POA: Diagnosis not present

## 2017-12-18 DIAGNOSIS — I10 Essential (primary) hypertension: Secondary | ICD-10-CM | POA: Diagnosis not present

## 2017-12-18 DIAGNOSIS — I4891 Unspecified atrial fibrillation: Secondary | ICD-10-CM | POA: Diagnosis not present

## 2017-12-21 DIAGNOSIS — I519 Heart disease, unspecified: Secondary | ICD-10-CM | POA: Diagnosis not present

## 2017-12-21 DIAGNOSIS — I4891 Unspecified atrial fibrillation: Secondary | ICD-10-CM | POA: Diagnosis not present

## 2017-12-21 DIAGNOSIS — E785 Hyperlipidemia, unspecified: Secondary | ICD-10-CM | POA: Diagnosis not present

## 2017-12-21 DIAGNOSIS — E039 Hypothyroidism, unspecified: Secondary | ICD-10-CM | POA: Diagnosis not present

## 2017-12-21 DIAGNOSIS — I10 Essential (primary) hypertension: Secondary | ICD-10-CM | POA: Diagnosis not present

## 2017-12-21 DIAGNOSIS — F330 Major depressive disorder, recurrent, mild: Secondary | ICD-10-CM | POA: Diagnosis not present

## 2017-12-23 ENCOUNTER — Telehealth: Payer: Self-pay | Admitting: *Deleted

## 2017-12-23 DIAGNOSIS — I519 Heart disease, unspecified: Secondary | ICD-10-CM | POA: Diagnosis not present

## 2017-12-23 DIAGNOSIS — I4891 Unspecified atrial fibrillation: Secondary | ICD-10-CM | POA: Diagnosis not present

## 2017-12-23 DIAGNOSIS — F330 Major depressive disorder, recurrent, mild: Secondary | ICD-10-CM | POA: Diagnosis not present

## 2017-12-23 DIAGNOSIS — E785 Hyperlipidemia, unspecified: Secondary | ICD-10-CM | POA: Diagnosis not present

## 2017-12-23 DIAGNOSIS — I10 Essential (primary) hypertension: Secondary | ICD-10-CM | POA: Diagnosis not present

## 2017-12-23 DIAGNOSIS — E039 Hypothyroidism, unspecified: Secondary | ICD-10-CM | POA: Diagnosis not present

## 2017-12-23 NOTE — Telephone Encounter (Signed)
Yes the Zoloft can be increased to 200 mg although the benefit is not always seen at that point.  We can go ahead and increase it to the 200 mg and send a new prescription for her do not see benefit then we may need to add something to it rather than just increase it.  Try increasing this first.

## 2017-12-23 NOTE — Telephone Encounter (Signed)
TC from [facility] nurse Pt c/o depression is on Zoloft 150 mg qd Is wanting to know if this can be increased This medication is not covered under Hospice Please advise

## 2017-12-25 DIAGNOSIS — I4891 Unspecified atrial fibrillation: Secondary | ICD-10-CM | POA: Diagnosis not present

## 2017-12-25 DIAGNOSIS — E785 Hyperlipidemia, unspecified: Secondary | ICD-10-CM | POA: Diagnosis not present

## 2017-12-25 DIAGNOSIS — I519 Heart disease, unspecified: Secondary | ICD-10-CM | POA: Diagnosis not present

## 2017-12-25 DIAGNOSIS — I10 Essential (primary) hypertension: Secondary | ICD-10-CM | POA: Diagnosis not present

## 2017-12-25 DIAGNOSIS — F330 Major depressive disorder, recurrent, mild: Secondary | ICD-10-CM | POA: Diagnosis not present

## 2017-12-25 DIAGNOSIS — E039 Hypothyroidism, unspecified: Secondary | ICD-10-CM | POA: Diagnosis not present

## 2017-12-25 MED ORDER — SERTRALINE HCL 100 MG PO TABS: 200.0000 mg | ORAL_TABLET | Freq: Every day | ORAL | 2 refills | Status: DC

## 2017-12-25 NOTE — Telephone Encounter (Signed)
Shirley Leblanc with Hospice aware of recommendations. Will try increase of medication first. New Rx sent into [facility] pharmacy

## 2017-12-26 DIAGNOSIS — F330 Major depressive disorder, recurrent, mild: Secondary | ICD-10-CM | POA: Diagnosis not present

## 2017-12-26 DIAGNOSIS — I10 Essential (primary) hypertension: Secondary | ICD-10-CM | POA: Diagnosis not present

## 2017-12-26 DIAGNOSIS — I4891 Unspecified atrial fibrillation: Secondary | ICD-10-CM | POA: Diagnosis not present

## 2017-12-26 DIAGNOSIS — R0602 Shortness of breath: Secondary | ICD-10-CM | POA: Diagnosis not present

## 2017-12-26 DIAGNOSIS — R52 Pain, unspecified: Secondary | ICD-10-CM | POA: Diagnosis not present

## 2017-12-26 DIAGNOSIS — I519 Heart disease, unspecified: Secondary | ICD-10-CM | POA: Diagnosis not present

## 2017-12-26 DIAGNOSIS — E785 Hyperlipidemia, unspecified: Secondary | ICD-10-CM | POA: Diagnosis not present

## 2017-12-26 DIAGNOSIS — R42 Dizziness and giddiness: Secondary | ICD-10-CM | POA: Diagnosis not present

## 2017-12-26 DIAGNOSIS — E039 Hypothyroidism, unspecified: Secondary | ICD-10-CM | POA: Diagnosis not present

## 2017-12-26 DIAGNOSIS — R531 Weakness: Secondary | ICD-10-CM | POA: Diagnosis not present

## 2017-12-28 DIAGNOSIS — F330 Major depressive disorder, recurrent, mild: Secondary | ICD-10-CM | POA: Diagnosis not present

## 2017-12-28 DIAGNOSIS — I4891 Unspecified atrial fibrillation: Secondary | ICD-10-CM | POA: Diagnosis not present

## 2017-12-28 DIAGNOSIS — I10 Essential (primary) hypertension: Secondary | ICD-10-CM | POA: Diagnosis not present

## 2017-12-28 DIAGNOSIS — E785 Hyperlipidemia, unspecified: Secondary | ICD-10-CM | POA: Diagnosis not present

## 2017-12-28 DIAGNOSIS — E039 Hypothyroidism, unspecified: Secondary | ICD-10-CM | POA: Diagnosis not present

## 2017-12-28 DIAGNOSIS — I519 Heart disease, unspecified: Secondary | ICD-10-CM | POA: Diagnosis not present

## 2017-12-29 DIAGNOSIS — I4891 Unspecified atrial fibrillation: Secondary | ICD-10-CM | POA: Diagnosis not present

## 2017-12-29 DIAGNOSIS — E785 Hyperlipidemia, unspecified: Secondary | ICD-10-CM | POA: Diagnosis not present

## 2017-12-29 DIAGNOSIS — F330 Major depressive disorder, recurrent, mild: Secondary | ICD-10-CM | POA: Diagnosis not present

## 2017-12-29 DIAGNOSIS — I10 Essential (primary) hypertension: Secondary | ICD-10-CM | POA: Diagnosis not present

## 2017-12-29 DIAGNOSIS — E039 Hypothyroidism, unspecified: Secondary | ICD-10-CM | POA: Diagnosis not present

## 2017-12-29 DIAGNOSIS — I519 Heart disease, unspecified: Secondary | ICD-10-CM | POA: Diagnosis not present

## 2017-12-30 DIAGNOSIS — I4891 Unspecified atrial fibrillation: Secondary | ICD-10-CM | POA: Diagnosis not present

## 2017-12-30 DIAGNOSIS — I519 Heart disease, unspecified: Secondary | ICD-10-CM | POA: Diagnosis not present

## 2017-12-30 DIAGNOSIS — I10 Essential (primary) hypertension: Secondary | ICD-10-CM | POA: Diagnosis not present

## 2017-12-30 DIAGNOSIS — E039 Hypothyroidism, unspecified: Secondary | ICD-10-CM | POA: Diagnosis not present

## 2017-12-30 DIAGNOSIS — F330 Major depressive disorder, recurrent, mild: Secondary | ICD-10-CM | POA: Diagnosis not present

## 2017-12-30 DIAGNOSIS — E785 Hyperlipidemia, unspecified: Secondary | ICD-10-CM | POA: Diagnosis not present

## 2018-01-01 DIAGNOSIS — F330 Major depressive disorder, recurrent, mild: Secondary | ICD-10-CM | POA: Diagnosis not present

## 2018-01-01 DIAGNOSIS — E039 Hypothyroidism, unspecified: Secondary | ICD-10-CM | POA: Diagnosis not present

## 2018-01-01 DIAGNOSIS — I4891 Unspecified atrial fibrillation: Secondary | ICD-10-CM | POA: Diagnosis not present

## 2018-01-01 DIAGNOSIS — E785 Hyperlipidemia, unspecified: Secondary | ICD-10-CM | POA: Diagnosis not present

## 2018-01-01 DIAGNOSIS — I519 Heart disease, unspecified: Secondary | ICD-10-CM | POA: Diagnosis not present

## 2018-01-01 DIAGNOSIS — I10 Essential (primary) hypertension: Secondary | ICD-10-CM | POA: Diagnosis not present

## 2018-01-04 ENCOUNTER — Other Ambulatory Visit: Payer: Self-pay | Admitting: Family Medicine

## 2018-01-06 DIAGNOSIS — I10 Essential (primary) hypertension: Secondary | ICD-10-CM | POA: Diagnosis not present

## 2018-01-06 DIAGNOSIS — I519 Heart disease, unspecified: Secondary | ICD-10-CM | POA: Diagnosis not present

## 2018-01-06 DIAGNOSIS — I4891 Unspecified atrial fibrillation: Secondary | ICD-10-CM | POA: Diagnosis not present

## 2018-01-06 DIAGNOSIS — E785 Hyperlipidemia, unspecified: Secondary | ICD-10-CM | POA: Diagnosis not present

## 2018-01-06 DIAGNOSIS — E039 Hypothyroidism, unspecified: Secondary | ICD-10-CM | POA: Diagnosis not present

## 2018-01-06 DIAGNOSIS — F330 Major depressive disorder, recurrent, mild: Secondary | ICD-10-CM | POA: Diagnosis not present

## 2018-01-06 MED ORDER — SERTRALINE HCL 100 MG PO TABS: 200.0000 mg | ORAL_TABLET | Freq: Every day | ORAL | 0 refills | Status: DC

## 2018-01-06 NOTE — Addendum Note (Signed)
Addended by: Antonietta Barcelona D on: 01/06/2018 11:26 AM   Modules accepted: Orders

## 2018-01-08 DIAGNOSIS — I519 Heart disease, unspecified: Secondary | ICD-10-CM | POA: Diagnosis not present

## 2018-01-08 DIAGNOSIS — E039 Hypothyroidism, unspecified: Secondary | ICD-10-CM | POA: Diagnosis not present

## 2018-01-08 DIAGNOSIS — I4891 Unspecified atrial fibrillation: Secondary | ICD-10-CM | POA: Diagnosis not present

## 2018-01-08 DIAGNOSIS — E785 Hyperlipidemia, unspecified: Secondary | ICD-10-CM | POA: Diagnosis not present

## 2018-01-08 DIAGNOSIS — F330 Major depressive disorder, recurrent, mild: Secondary | ICD-10-CM | POA: Diagnosis not present

## 2018-01-08 DIAGNOSIS — I10 Essential (primary) hypertension: Secondary | ICD-10-CM | POA: Diagnosis not present

## 2018-01-11 DIAGNOSIS — E785 Hyperlipidemia, unspecified: Secondary | ICD-10-CM | POA: Diagnosis not present

## 2018-01-11 DIAGNOSIS — E039 Hypothyroidism, unspecified: Secondary | ICD-10-CM | POA: Diagnosis not present

## 2018-01-11 DIAGNOSIS — I519 Heart disease, unspecified: Secondary | ICD-10-CM | POA: Diagnosis not present

## 2018-01-11 DIAGNOSIS — I10 Essential (primary) hypertension: Secondary | ICD-10-CM | POA: Diagnosis not present

## 2018-01-11 DIAGNOSIS — I4891 Unspecified atrial fibrillation: Secondary | ICD-10-CM | POA: Diagnosis not present

## 2018-01-11 DIAGNOSIS — F330 Major depressive disorder, recurrent, mild: Secondary | ICD-10-CM | POA: Diagnosis not present

## 2018-01-13 DIAGNOSIS — F330 Major depressive disorder, recurrent, mild: Secondary | ICD-10-CM | POA: Diagnosis not present

## 2018-01-13 DIAGNOSIS — I10 Essential (primary) hypertension: Secondary | ICD-10-CM | POA: Diagnosis not present

## 2018-01-13 DIAGNOSIS — E785 Hyperlipidemia, unspecified: Secondary | ICD-10-CM | POA: Diagnosis not present

## 2018-01-13 DIAGNOSIS — E039 Hypothyroidism, unspecified: Secondary | ICD-10-CM | POA: Diagnosis not present

## 2018-01-13 DIAGNOSIS — I519 Heart disease, unspecified: Secondary | ICD-10-CM | POA: Diagnosis not present

## 2018-01-13 DIAGNOSIS — I4891 Unspecified atrial fibrillation: Secondary | ICD-10-CM | POA: Diagnosis not present

## 2018-01-14 NOTE — Telephone Encounter (Signed)
Error in charting.

## 2018-01-15 DIAGNOSIS — F330 Major depressive disorder, recurrent, mild: Secondary | ICD-10-CM | POA: Diagnosis not present

## 2018-01-15 DIAGNOSIS — I4891 Unspecified atrial fibrillation: Secondary | ICD-10-CM | POA: Diagnosis not present

## 2018-01-15 DIAGNOSIS — E039 Hypothyroidism, unspecified: Secondary | ICD-10-CM | POA: Diagnosis not present

## 2018-01-15 DIAGNOSIS — E785 Hyperlipidemia, unspecified: Secondary | ICD-10-CM | POA: Diagnosis not present

## 2018-01-15 DIAGNOSIS — I519 Heart disease, unspecified: Secondary | ICD-10-CM | POA: Diagnosis not present

## 2018-01-15 DIAGNOSIS — I10 Essential (primary) hypertension: Secondary | ICD-10-CM | POA: Diagnosis not present

## 2018-01-18 DIAGNOSIS — I10 Essential (primary) hypertension: Secondary | ICD-10-CM | POA: Diagnosis not present

## 2018-01-18 DIAGNOSIS — E785 Hyperlipidemia, unspecified: Secondary | ICD-10-CM | POA: Diagnosis not present

## 2018-01-18 DIAGNOSIS — I4891 Unspecified atrial fibrillation: Secondary | ICD-10-CM | POA: Diagnosis not present

## 2018-01-18 DIAGNOSIS — E039 Hypothyroidism, unspecified: Secondary | ICD-10-CM | POA: Diagnosis not present

## 2018-01-18 DIAGNOSIS — F330 Major depressive disorder, recurrent, mild: Secondary | ICD-10-CM | POA: Diagnosis not present

## 2018-01-18 DIAGNOSIS — I519 Heart disease, unspecified: Secondary | ICD-10-CM | POA: Diagnosis not present

## 2018-01-20 DIAGNOSIS — I519 Heart disease, unspecified: Secondary | ICD-10-CM | POA: Diagnosis not present

## 2018-01-20 DIAGNOSIS — E039 Hypothyroidism, unspecified: Secondary | ICD-10-CM | POA: Diagnosis not present

## 2018-01-20 DIAGNOSIS — I4891 Unspecified atrial fibrillation: Secondary | ICD-10-CM | POA: Diagnosis not present

## 2018-01-20 DIAGNOSIS — I10 Essential (primary) hypertension: Secondary | ICD-10-CM | POA: Diagnosis not present

## 2018-01-20 DIAGNOSIS — F330 Major depressive disorder, recurrent, mild: Secondary | ICD-10-CM | POA: Diagnosis not present

## 2018-01-20 DIAGNOSIS — E785 Hyperlipidemia, unspecified: Secondary | ICD-10-CM | POA: Diagnosis not present

## 2018-01-22 DIAGNOSIS — F330 Major depressive disorder, recurrent, mild: Secondary | ICD-10-CM | POA: Diagnosis not present

## 2018-01-22 DIAGNOSIS — E785 Hyperlipidemia, unspecified: Secondary | ICD-10-CM | POA: Diagnosis not present

## 2018-01-22 DIAGNOSIS — I519 Heart disease, unspecified: Secondary | ICD-10-CM | POA: Diagnosis not present

## 2018-01-22 DIAGNOSIS — I4891 Unspecified atrial fibrillation: Secondary | ICD-10-CM | POA: Diagnosis not present

## 2018-01-22 DIAGNOSIS — I10 Essential (primary) hypertension: Secondary | ICD-10-CM | POA: Diagnosis not present

## 2018-01-22 DIAGNOSIS — E039 Hypothyroidism, unspecified: Secondary | ICD-10-CM | POA: Diagnosis not present

## 2018-01-25 DIAGNOSIS — R0602 Shortness of breath: Secondary | ICD-10-CM | POA: Diagnosis not present

## 2018-01-25 DIAGNOSIS — I519 Heart disease, unspecified: Secondary | ICD-10-CM | POA: Diagnosis not present

## 2018-01-25 DIAGNOSIS — R531 Weakness: Secondary | ICD-10-CM | POA: Diagnosis not present

## 2018-01-25 DIAGNOSIS — I10 Essential (primary) hypertension: Secondary | ICD-10-CM | POA: Diagnosis not present

## 2018-01-25 DIAGNOSIS — R42 Dizziness and giddiness: Secondary | ICD-10-CM | POA: Diagnosis not present

## 2018-01-25 DIAGNOSIS — E785 Hyperlipidemia, unspecified: Secondary | ICD-10-CM | POA: Diagnosis not present

## 2018-01-25 DIAGNOSIS — F330 Major depressive disorder, recurrent, mild: Secondary | ICD-10-CM | POA: Diagnosis not present

## 2018-01-25 DIAGNOSIS — E039 Hypothyroidism, unspecified: Secondary | ICD-10-CM | POA: Diagnosis not present

## 2018-01-25 DIAGNOSIS — I4891 Unspecified atrial fibrillation: Secondary | ICD-10-CM | POA: Diagnosis not present

## 2018-01-25 DIAGNOSIS — R52 Pain, unspecified: Secondary | ICD-10-CM | POA: Diagnosis not present

## 2018-01-27 DIAGNOSIS — E785 Hyperlipidemia, unspecified: Secondary | ICD-10-CM | POA: Diagnosis not present

## 2018-01-27 DIAGNOSIS — I10 Essential (primary) hypertension: Secondary | ICD-10-CM | POA: Diagnosis not present

## 2018-01-27 DIAGNOSIS — I4891 Unspecified atrial fibrillation: Secondary | ICD-10-CM | POA: Diagnosis not present

## 2018-01-27 DIAGNOSIS — E039 Hypothyroidism, unspecified: Secondary | ICD-10-CM | POA: Diagnosis not present

## 2018-01-27 DIAGNOSIS — I519 Heart disease, unspecified: Secondary | ICD-10-CM | POA: Diagnosis not present

## 2018-01-27 DIAGNOSIS — F330 Major depressive disorder, recurrent, mild: Secondary | ICD-10-CM | POA: Diagnosis not present

## 2018-01-29 DIAGNOSIS — I519 Heart disease, unspecified: Secondary | ICD-10-CM | POA: Diagnosis not present

## 2018-01-29 DIAGNOSIS — I4891 Unspecified atrial fibrillation: Secondary | ICD-10-CM | POA: Diagnosis not present

## 2018-01-29 DIAGNOSIS — F330 Major depressive disorder, recurrent, mild: Secondary | ICD-10-CM | POA: Diagnosis not present

## 2018-01-29 DIAGNOSIS — E785 Hyperlipidemia, unspecified: Secondary | ICD-10-CM | POA: Diagnosis not present

## 2018-01-29 DIAGNOSIS — I10 Essential (primary) hypertension: Secondary | ICD-10-CM | POA: Diagnosis not present

## 2018-01-29 DIAGNOSIS — E039 Hypothyroidism, unspecified: Secondary | ICD-10-CM | POA: Diagnosis not present

## 2018-02-01 DIAGNOSIS — E039 Hypothyroidism, unspecified: Secondary | ICD-10-CM | POA: Diagnosis not present

## 2018-02-01 DIAGNOSIS — I10 Essential (primary) hypertension: Secondary | ICD-10-CM | POA: Diagnosis not present

## 2018-02-01 DIAGNOSIS — I519 Heart disease, unspecified: Secondary | ICD-10-CM | POA: Diagnosis not present

## 2018-02-01 DIAGNOSIS — F330 Major depressive disorder, recurrent, mild: Secondary | ICD-10-CM | POA: Diagnosis not present

## 2018-02-01 DIAGNOSIS — E785 Hyperlipidemia, unspecified: Secondary | ICD-10-CM | POA: Diagnosis not present

## 2018-02-01 DIAGNOSIS — I4891 Unspecified atrial fibrillation: Secondary | ICD-10-CM | POA: Diagnosis not present

## 2018-02-03 DIAGNOSIS — E039 Hypothyroidism, unspecified: Secondary | ICD-10-CM | POA: Diagnosis not present

## 2018-02-03 DIAGNOSIS — I4891 Unspecified atrial fibrillation: Secondary | ICD-10-CM | POA: Diagnosis not present

## 2018-02-03 DIAGNOSIS — E785 Hyperlipidemia, unspecified: Secondary | ICD-10-CM | POA: Diagnosis not present

## 2018-02-03 DIAGNOSIS — F330 Major depressive disorder, recurrent, mild: Secondary | ICD-10-CM | POA: Diagnosis not present

## 2018-02-03 DIAGNOSIS — I519 Heart disease, unspecified: Secondary | ICD-10-CM | POA: Diagnosis not present

## 2018-02-03 DIAGNOSIS — I10 Essential (primary) hypertension: Secondary | ICD-10-CM | POA: Diagnosis not present

## 2018-02-05 DIAGNOSIS — F330 Major depressive disorder, recurrent, mild: Secondary | ICD-10-CM | POA: Diagnosis not present

## 2018-02-05 DIAGNOSIS — I4891 Unspecified atrial fibrillation: Secondary | ICD-10-CM | POA: Diagnosis not present

## 2018-02-05 DIAGNOSIS — I519 Heart disease, unspecified: Secondary | ICD-10-CM | POA: Diagnosis not present

## 2018-02-05 DIAGNOSIS — E785 Hyperlipidemia, unspecified: Secondary | ICD-10-CM | POA: Diagnosis not present

## 2018-02-05 DIAGNOSIS — I10 Essential (primary) hypertension: Secondary | ICD-10-CM | POA: Diagnosis not present

## 2018-02-05 DIAGNOSIS — E039 Hypothyroidism, unspecified: Secondary | ICD-10-CM | POA: Diagnosis not present

## 2018-02-08 ENCOUNTER — Other Ambulatory Visit: Payer: Self-pay | Admitting: *Deleted

## 2018-02-08 ENCOUNTER — Telehealth: Payer: Self-pay | Admitting: *Deleted

## 2018-02-08 DIAGNOSIS — I519 Heart disease, unspecified: Secondary | ICD-10-CM | POA: Diagnosis not present

## 2018-02-08 DIAGNOSIS — E039 Hypothyroidism, unspecified: Secondary | ICD-10-CM | POA: Diagnosis not present

## 2018-02-08 DIAGNOSIS — E785 Hyperlipidemia, unspecified: Secondary | ICD-10-CM | POA: Diagnosis not present

## 2018-02-08 DIAGNOSIS — I4891 Unspecified atrial fibrillation: Secondary | ICD-10-CM | POA: Diagnosis not present

## 2018-02-08 DIAGNOSIS — I10 Essential (primary) hypertension: Secondary | ICD-10-CM | POA: Diagnosis not present

## 2018-02-08 DIAGNOSIS — F330 Major depressive disorder, recurrent, mild: Secondary | ICD-10-CM | POA: Diagnosis not present

## 2018-02-08 MED ORDER — NYSTATIN 100000 UNIT/GM EX POWD: Freq: Four times a day (QID) | CUTANEOUS | 1 refills | Status: DC

## 2018-02-08 NOTE — Telephone Encounter (Signed)
Refill of nystatin powder was sent to [facility] with message to deliver to hospice.

## 2018-02-08 NOTE — Telephone Encounter (Signed)
Yes go ahead and refill nystatin powder

## 2018-02-08 NOTE — Telephone Encounter (Signed)
VM received Friday afternoon Shirley Leblanc w/ Hospice RF on nystatin powder

## 2018-02-10 DIAGNOSIS — I519 Heart disease, unspecified: Secondary | ICD-10-CM | POA: Diagnosis not present

## 2018-02-10 DIAGNOSIS — I4891 Unspecified atrial fibrillation: Secondary | ICD-10-CM | POA: Diagnosis not present

## 2018-02-10 DIAGNOSIS — E785 Hyperlipidemia, unspecified: Secondary | ICD-10-CM | POA: Diagnosis not present

## 2018-02-10 DIAGNOSIS — I10 Essential (primary) hypertension: Secondary | ICD-10-CM | POA: Diagnosis not present

## 2018-02-10 DIAGNOSIS — E039 Hypothyroidism, unspecified: Secondary | ICD-10-CM | POA: Diagnosis not present

## 2018-02-10 DIAGNOSIS — F330 Major depressive disorder, recurrent, mild: Secondary | ICD-10-CM | POA: Diagnosis not present

## 2018-02-12 DIAGNOSIS — F330 Major depressive disorder, recurrent, mild: Secondary | ICD-10-CM | POA: Diagnosis not present

## 2018-02-12 DIAGNOSIS — I4891 Unspecified atrial fibrillation: Secondary | ICD-10-CM | POA: Diagnosis not present

## 2018-02-12 DIAGNOSIS — E039 Hypothyroidism, unspecified: Secondary | ICD-10-CM | POA: Diagnosis not present

## 2018-02-12 DIAGNOSIS — I519 Heart disease, unspecified: Secondary | ICD-10-CM | POA: Diagnosis not present

## 2018-02-12 DIAGNOSIS — E785 Hyperlipidemia, unspecified: Secondary | ICD-10-CM | POA: Diagnosis not present

## 2018-02-12 DIAGNOSIS — I10 Essential (primary) hypertension: Secondary | ICD-10-CM | POA: Diagnosis not present

## 2018-02-15 DIAGNOSIS — I519 Heart disease, unspecified: Secondary | ICD-10-CM | POA: Diagnosis not present

## 2018-02-15 DIAGNOSIS — E785 Hyperlipidemia, unspecified: Secondary | ICD-10-CM | POA: Diagnosis not present

## 2018-02-15 DIAGNOSIS — F330 Major depressive disorder, recurrent, mild: Secondary | ICD-10-CM | POA: Diagnosis not present

## 2018-02-15 DIAGNOSIS — I4891 Unspecified atrial fibrillation: Secondary | ICD-10-CM | POA: Diagnosis not present

## 2018-02-15 DIAGNOSIS — E039 Hypothyroidism, unspecified: Secondary | ICD-10-CM | POA: Diagnosis not present

## 2018-02-15 DIAGNOSIS — I10 Essential (primary) hypertension: Secondary | ICD-10-CM | POA: Diagnosis not present

## 2018-02-17 ENCOUNTER — Other Ambulatory Visit: Payer: Self-pay | Admitting: Family Medicine

## 2018-02-17 DIAGNOSIS — I10 Essential (primary) hypertension: Secondary | ICD-10-CM | POA: Diagnosis not present

## 2018-02-17 DIAGNOSIS — F330 Major depressive disorder, recurrent, mild: Secondary | ICD-10-CM | POA: Diagnosis not present

## 2018-02-17 DIAGNOSIS — I519 Heart disease, unspecified: Secondary | ICD-10-CM | POA: Diagnosis not present

## 2018-02-17 DIAGNOSIS — E785 Hyperlipidemia, unspecified: Secondary | ICD-10-CM | POA: Diagnosis not present

## 2018-02-17 DIAGNOSIS — I4891 Unspecified atrial fibrillation: Secondary | ICD-10-CM | POA: Diagnosis not present

## 2018-02-17 DIAGNOSIS — E039 Hypothyroidism, unspecified: Secondary | ICD-10-CM | POA: Diagnosis not present

## 2018-02-18 NOTE — Telephone Encounter (Signed)
Last seen 07/14/17

## 2018-02-19 DIAGNOSIS — I4891 Unspecified atrial fibrillation: Secondary | ICD-10-CM | POA: Diagnosis not present

## 2018-02-19 DIAGNOSIS — I10 Essential (primary) hypertension: Secondary | ICD-10-CM | POA: Diagnosis not present

## 2018-02-19 DIAGNOSIS — I519 Heart disease, unspecified: Secondary | ICD-10-CM | POA: Diagnosis not present

## 2018-02-19 DIAGNOSIS — E785 Hyperlipidemia, unspecified: Secondary | ICD-10-CM | POA: Diagnosis not present

## 2018-02-19 DIAGNOSIS — F330 Major depressive disorder, recurrent, mild: Secondary | ICD-10-CM | POA: Diagnosis not present

## 2018-02-19 DIAGNOSIS — E039 Hypothyroidism, unspecified: Secondary | ICD-10-CM | POA: Diagnosis not present

## 2018-02-22 DIAGNOSIS — I4891 Unspecified atrial fibrillation: Secondary | ICD-10-CM | POA: Diagnosis not present

## 2018-02-22 DIAGNOSIS — E039 Hypothyroidism, unspecified: Secondary | ICD-10-CM | POA: Diagnosis not present

## 2018-02-22 DIAGNOSIS — E785 Hyperlipidemia, unspecified: Secondary | ICD-10-CM | POA: Diagnosis not present

## 2018-02-22 DIAGNOSIS — I10 Essential (primary) hypertension: Secondary | ICD-10-CM | POA: Diagnosis not present

## 2018-02-22 DIAGNOSIS — F330 Major depressive disorder, recurrent, mild: Secondary | ICD-10-CM | POA: Diagnosis not present

## 2018-02-22 DIAGNOSIS — I519 Heart disease, unspecified: Secondary | ICD-10-CM | POA: Diagnosis not present

## 2018-02-24 DIAGNOSIS — I10 Essential (primary) hypertension: Secondary | ICD-10-CM | POA: Diagnosis not present

## 2018-02-24 DIAGNOSIS — E039 Hypothyroidism, unspecified: Secondary | ICD-10-CM | POA: Diagnosis not present

## 2018-02-24 DIAGNOSIS — F330 Major depressive disorder, recurrent, mild: Secondary | ICD-10-CM | POA: Diagnosis not present

## 2018-02-24 DIAGNOSIS — I519 Heart disease, unspecified: Secondary | ICD-10-CM | POA: Diagnosis not present

## 2018-02-24 DIAGNOSIS — I4891 Unspecified atrial fibrillation: Secondary | ICD-10-CM | POA: Diagnosis not present

## 2018-02-24 DIAGNOSIS — E785 Hyperlipidemia, unspecified: Secondary | ICD-10-CM | POA: Diagnosis not present

## 2018-02-25 DIAGNOSIS — I4891 Unspecified atrial fibrillation: Secondary | ICD-10-CM | POA: Diagnosis not present

## 2018-02-25 DIAGNOSIS — I10 Essential (primary) hypertension: Secondary | ICD-10-CM | POA: Diagnosis not present

## 2018-02-25 DIAGNOSIS — I519 Heart disease, unspecified: Secondary | ICD-10-CM | POA: Diagnosis not present

## 2018-02-25 DIAGNOSIS — R52 Pain, unspecified: Secondary | ICD-10-CM | POA: Diagnosis not present

## 2018-02-25 DIAGNOSIS — E039 Hypothyroidism, unspecified: Secondary | ICD-10-CM | POA: Diagnosis not present

## 2018-02-25 DIAGNOSIS — R531 Weakness: Secondary | ICD-10-CM | POA: Diagnosis not present

## 2018-02-25 DIAGNOSIS — R0602 Shortness of breath: Secondary | ICD-10-CM | POA: Diagnosis not present

## 2018-02-25 DIAGNOSIS — F330 Major depressive disorder, recurrent, mild: Secondary | ICD-10-CM | POA: Diagnosis not present

## 2018-02-25 DIAGNOSIS — E785 Hyperlipidemia, unspecified: Secondary | ICD-10-CM | POA: Diagnosis not present

## 2018-02-25 DIAGNOSIS — R42 Dizziness and giddiness: Secondary | ICD-10-CM | POA: Diagnosis not present

## 2018-02-26 DIAGNOSIS — F330 Major depressive disorder, recurrent, mild: Secondary | ICD-10-CM | POA: Diagnosis not present

## 2018-02-26 DIAGNOSIS — E039 Hypothyroidism, unspecified: Secondary | ICD-10-CM | POA: Diagnosis not present

## 2018-02-26 DIAGNOSIS — E785 Hyperlipidemia, unspecified: Secondary | ICD-10-CM | POA: Diagnosis not present

## 2018-02-26 DIAGNOSIS — I10 Essential (primary) hypertension: Secondary | ICD-10-CM | POA: Diagnosis not present

## 2018-02-26 DIAGNOSIS — I4891 Unspecified atrial fibrillation: Secondary | ICD-10-CM | POA: Diagnosis not present

## 2018-02-26 DIAGNOSIS — I519 Heart disease, unspecified: Secondary | ICD-10-CM | POA: Diagnosis not present

## 2018-03-01 DIAGNOSIS — F330 Major depressive disorder, recurrent, mild: Secondary | ICD-10-CM | POA: Diagnosis not present

## 2018-03-01 DIAGNOSIS — I519 Heart disease, unspecified: Secondary | ICD-10-CM | POA: Diagnosis not present

## 2018-03-01 DIAGNOSIS — I10 Essential (primary) hypertension: Secondary | ICD-10-CM | POA: Diagnosis not present

## 2018-03-01 DIAGNOSIS — I4891 Unspecified atrial fibrillation: Secondary | ICD-10-CM | POA: Diagnosis not present

## 2018-03-01 DIAGNOSIS — E039 Hypothyroidism, unspecified: Secondary | ICD-10-CM | POA: Diagnosis not present

## 2018-03-01 DIAGNOSIS — E785 Hyperlipidemia, unspecified: Secondary | ICD-10-CM | POA: Diagnosis not present

## 2018-03-03 ENCOUNTER — Other Ambulatory Visit: Payer: Self-pay | Admitting: Family Medicine

## 2018-03-03 DIAGNOSIS — F330 Major depressive disorder, recurrent, mild: Secondary | ICD-10-CM | POA: Diagnosis not present

## 2018-03-03 DIAGNOSIS — E039 Hypothyroidism, unspecified: Secondary | ICD-10-CM | POA: Diagnosis not present

## 2018-03-03 DIAGNOSIS — I4891 Unspecified atrial fibrillation: Secondary | ICD-10-CM | POA: Diagnosis not present

## 2018-03-03 DIAGNOSIS — I10 Essential (primary) hypertension: Secondary | ICD-10-CM | POA: Diagnosis not present

## 2018-03-03 DIAGNOSIS — I519 Heart disease, unspecified: Secondary | ICD-10-CM | POA: Diagnosis not present

## 2018-03-03 DIAGNOSIS — E785 Hyperlipidemia, unspecified: Secondary | ICD-10-CM | POA: Diagnosis not present

## 2018-03-04 ENCOUNTER — Other Ambulatory Visit: Payer: Self-pay | Admitting: Family Medicine

## 2018-03-04 NOTE — Telephone Encounter (Signed)
Last seen 07/14/17

## 2018-03-05 DIAGNOSIS — E039 Hypothyroidism, unspecified: Secondary | ICD-10-CM | POA: Diagnosis not present

## 2018-03-05 DIAGNOSIS — E785 Hyperlipidemia, unspecified: Secondary | ICD-10-CM | POA: Diagnosis not present

## 2018-03-05 DIAGNOSIS — I519 Heart disease, unspecified: Secondary | ICD-10-CM | POA: Diagnosis not present

## 2018-03-05 DIAGNOSIS — I10 Essential (primary) hypertension: Secondary | ICD-10-CM | POA: Diagnosis not present

## 2018-03-05 DIAGNOSIS — F330 Major depressive disorder, recurrent, mild: Secondary | ICD-10-CM | POA: Diagnosis not present

## 2018-03-05 DIAGNOSIS — I4891 Unspecified atrial fibrillation: Secondary | ICD-10-CM | POA: Diagnosis not present

## 2018-03-05 NOTE — Telephone Encounter (Signed)
Last seen 07/14/17

## 2018-03-08 ENCOUNTER — Telehealth: Payer: Self-pay

## 2018-03-08 DIAGNOSIS — E039 Hypothyroidism, unspecified: Secondary | ICD-10-CM | POA: Diagnosis not present

## 2018-03-08 DIAGNOSIS — F330 Major depressive disorder, recurrent, mild: Secondary | ICD-10-CM | POA: Diagnosis not present

## 2018-03-08 DIAGNOSIS — E785 Hyperlipidemia, unspecified: Secondary | ICD-10-CM | POA: Diagnosis not present

## 2018-03-08 DIAGNOSIS — I10 Essential (primary) hypertension: Secondary | ICD-10-CM | POA: Diagnosis not present

## 2018-03-08 DIAGNOSIS — I519 Heart disease, unspecified: Secondary | ICD-10-CM | POA: Diagnosis not present

## 2018-03-08 DIAGNOSIS — I4891 Unspecified atrial fibrillation: Secondary | ICD-10-CM | POA: Diagnosis not present

## 2018-03-09 ENCOUNTER — Telehealth: Payer: Self-pay

## 2018-03-09 NOTE — Telephone Encounter (Signed)
Insurance denied Ondansetron   Not covered under Part D  Hospice program is required to provide drugs to control pain releif related to terminal illness

## 2018-03-10 DIAGNOSIS — F330 Major depressive disorder, recurrent, mild: Secondary | ICD-10-CM | POA: Diagnosis not present

## 2018-03-10 DIAGNOSIS — I4891 Unspecified atrial fibrillation: Secondary | ICD-10-CM | POA: Diagnosis not present

## 2018-03-10 DIAGNOSIS — E039 Hypothyroidism, unspecified: Secondary | ICD-10-CM | POA: Diagnosis not present

## 2018-03-10 DIAGNOSIS — I10 Essential (primary) hypertension: Secondary | ICD-10-CM | POA: Diagnosis not present

## 2018-03-10 DIAGNOSIS — I519 Heart disease, unspecified: Secondary | ICD-10-CM | POA: Diagnosis not present

## 2018-03-10 DIAGNOSIS — E785 Hyperlipidemia, unspecified: Secondary | ICD-10-CM | POA: Diagnosis not present

## 2018-03-10 NOTE — Telephone Encounter (Signed)
x

## 2018-03-12 DIAGNOSIS — I4891 Unspecified atrial fibrillation: Secondary | ICD-10-CM | POA: Diagnosis not present

## 2018-03-12 DIAGNOSIS — F330 Major depressive disorder, recurrent, mild: Secondary | ICD-10-CM | POA: Diagnosis not present

## 2018-03-12 DIAGNOSIS — I519 Heart disease, unspecified: Secondary | ICD-10-CM | POA: Diagnosis not present

## 2018-03-12 DIAGNOSIS — I10 Essential (primary) hypertension: Secondary | ICD-10-CM | POA: Diagnosis not present

## 2018-03-12 DIAGNOSIS — E039 Hypothyroidism, unspecified: Secondary | ICD-10-CM | POA: Diagnosis not present

## 2018-03-12 DIAGNOSIS — E785 Hyperlipidemia, unspecified: Secondary | ICD-10-CM | POA: Diagnosis not present

## 2018-03-15 DIAGNOSIS — I4891 Unspecified atrial fibrillation: Secondary | ICD-10-CM | POA: Diagnosis not present

## 2018-03-15 DIAGNOSIS — F330 Major depressive disorder, recurrent, mild: Secondary | ICD-10-CM | POA: Diagnosis not present

## 2018-03-15 DIAGNOSIS — E785 Hyperlipidemia, unspecified: Secondary | ICD-10-CM | POA: Diagnosis not present

## 2018-03-15 DIAGNOSIS — I519 Heart disease, unspecified: Secondary | ICD-10-CM | POA: Diagnosis not present

## 2018-03-15 DIAGNOSIS — I10 Essential (primary) hypertension: Secondary | ICD-10-CM | POA: Diagnosis not present

## 2018-03-15 DIAGNOSIS — E039 Hypothyroidism, unspecified: Secondary | ICD-10-CM | POA: Diagnosis not present

## 2018-03-17 DIAGNOSIS — E785 Hyperlipidemia, unspecified: Secondary | ICD-10-CM | POA: Diagnosis not present

## 2018-03-17 DIAGNOSIS — F330 Major depressive disorder, recurrent, mild: Secondary | ICD-10-CM | POA: Diagnosis not present

## 2018-03-17 DIAGNOSIS — I4891 Unspecified atrial fibrillation: Secondary | ICD-10-CM | POA: Diagnosis not present

## 2018-03-17 DIAGNOSIS — E039 Hypothyroidism, unspecified: Secondary | ICD-10-CM | POA: Diagnosis not present

## 2018-03-17 DIAGNOSIS — I10 Essential (primary) hypertension: Secondary | ICD-10-CM | POA: Diagnosis not present

## 2018-03-17 DIAGNOSIS — I519 Heart disease, unspecified: Secondary | ICD-10-CM | POA: Diagnosis not present

## 2018-03-18 ENCOUNTER — Ambulatory Visit (INDEPENDENT_AMBULATORY_CARE_PROVIDER_SITE_OTHER)

## 2018-03-18 ENCOUNTER — Telehealth: Payer: Self-pay | Admitting: *Deleted

## 2018-03-18 DIAGNOSIS — I4891 Unspecified atrial fibrillation: Secondary | ICD-10-CM | POA: Diagnosis not present

## 2018-03-18 DIAGNOSIS — I519 Heart disease, unspecified: Secondary | ICD-10-CM | POA: Diagnosis not present

## 2018-03-18 DIAGNOSIS — R42 Dizziness and giddiness: Secondary | ICD-10-CM

## 2018-03-18 DIAGNOSIS — E785 Hyperlipidemia, unspecified: Secondary | ICD-10-CM | POA: Diagnosis not present

## 2018-03-18 DIAGNOSIS — R52 Pain, unspecified: Secondary | ICD-10-CM

## 2018-03-18 DIAGNOSIS — I10 Essential (primary) hypertension: Secondary | ICD-10-CM

## 2018-03-18 DIAGNOSIS — E039 Hypothyroidism, unspecified: Secondary | ICD-10-CM

## 2018-03-18 DIAGNOSIS — R0602 Shortness of breath: Secondary | ICD-10-CM

## 2018-03-18 DIAGNOSIS — F330 Major depressive disorder, recurrent, mild: Secondary | ICD-10-CM

## 2018-03-18 DIAGNOSIS — R531 Weakness: Secondary | ICD-10-CM

## 2018-03-18 MED ORDER — BUPROPION HCL ER (XL) 150 MG PO TB24: 150.0000 mg | ORAL_TABLET | Freq: Every day | ORAL | 2 refills | Status: DC

## 2018-03-18 NOTE — Telephone Encounter (Signed)
VM received from [facility] in May about pts depression Zoloft was increase to 200 mg at that time. Her depression is worse, the discussion last time was that the next time additional medication may need to be added Please advise

## 2018-03-18 NOTE — Telephone Encounter (Signed)
Please let them know that I sent Wellbutrin for her that she can take with her Zoloft

## 2018-03-18 NOTE — Telephone Encounter (Signed)
Notified Manuela Schwartz w/ Hospice of RX Verbalizes understanding

## 2018-03-19 DIAGNOSIS — I4891 Unspecified atrial fibrillation: Secondary | ICD-10-CM | POA: Diagnosis not present

## 2018-03-19 DIAGNOSIS — I10 Essential (primary) hypertension: Secondary | ICD-10-CM | POA: Diagnosis not present

## 2018-03-19 DIAGNOSIS — I519 Heart disease, unspecified: Secondary | ICD-10-CM | POA: Diagnosis not present

## 2018-03-19 DIAGNOSIS — E039 Hypothyroidism, unspecified: Secondary | ICD-10-CM | POA: Diagnosis not present

## 2018-03-19 DIAGNOSIS — F330 Major depressive disorder, recurrent, mild: Secondary | ICD-10-CM | POA: Diagnosis not present

## 2018-03-19 DIAGNOSIS — E785 Hyperlipidemia, unspecified: Secondary | ICD-10-CM | POA: Diagnosis not present

## 2018-03-24 DIAGNOSIS — E039 Hypothyroidism, unspecified: Secondary | ICD-10-CM | POA: Diagnosis not present

## 2018-03-24 DIAGNOSIS — F330 Major depressive disorder, recurrent, mild: Secondary | ICD-10-CM | POA: Diagnosis not present

## 2018-03-24 DIAGNOSIS — I519 Heart disease, unspecified: Secondary | ICD-10-CM | POA: Diagnosis not present

## 2018-03-24 DIAGNOSIS — E785 Hyperlipidemia, unspecified: Secondary | ICD-10-CM | POA: Diagnosis not present

## 2018-03-24 DIAGNOSIS — I10 Essential (primary) hypertension: Secondary | ICD-10-CM | POA: Diagnosis not present

## 2018-03-24 DIAGNOSIS — I4891 Unspecified atrial fibrillation: Secondary | ICD-10-CM | POA: Diagnosis not present

## 2018-03-26 DIAGNOSIS — E039 Hypothyroidism, unspecified: Secondary | ICD-10-CM | POA: Diagnosis not present

## 2018-03-26 DIAGNOSIS — F330 Major depressive disorder, recurrent, mild: Secondary | ICD-10-CM | POA: Diagnosis not present

## 2018-03-26 DIAGNOSIS — I519 Heart disease, unspecified: Secondary | ICD-10-CM | POA: Diagnosis not present

## 2018-03-26 DIAGNOSIS — I4891 Unspecified atrial fibrillation: Secondary | ICD-10-CM | POA: Diagnosis not present

## 2018-03-26 DIAGNOSIS — E785 Hyperlipidemia, unspecified: Secondary | ICD-10-CM | POA: Diagnosis not present

## 2018-03-26 DIAGNOSIS — I10 Essential (primary) hypertension: Secondary | ICD-10-CM | POA: Diagnosis not present

## 2018-03-28 DIAGNOSIS — I10 Essential (primary) hypertension: Secondary | ICD-10-CM | POA: Diagnosis not present

## 2018-03-28 DIAGNOSIS — I519 Heart disease, unspecified: Secondary | ICD-10-CM | POA: Diagnosis not present

## 2018-03-28 DIAGNOSIS — I4891 Unspecified atrial fibrillation: Secondary | ICD-10-CM | POA: Diagnosis not present

## 2018-03-28 DIAGNOSIS — R0602 Shortness of breath: Secondary | ICD-10-CM | POA: Diagnosis not present

## 2018-03-28 DIAGNOSIS — E785 Hyperlipidemia, unspecified: Secondary | ICD-10-CM | POA: Diagnosis not present

## 2018-03-28 DIAGNOSIS — E039 Hypothyroidism, unspecified: Secondary | ICD-10-CM | POA: Diagnosis not present

## 2018-03-28 DIAGNOSIS — F330 Major depressive disorder, recurrent, mild: Secondary | ICD-10-CM | POA: Diagnosis not present

## 2018-03-28 DIAGNOSIS — R42 Dizziness and giddiness: Secondary | ICD-10-CM | POA: Diagnosis not present

## 2018-03-28 DIAGNOSIS — R531 Weakness: Secondary | ICD-10-CM | POA: Diagnosis not present

## 2018-03-28 DIAGNOSIS — R52 Pain, unspecified: Secondary | ICD-10-CM | POA: Diagnosis not present

## 2018-03-31 DIAGNOSIS — E039 Hypothyroidism, unspecified: Secondary | ICD-10-CM | POA: Diagnosis not present

## 2018-03-31 DIAGNOSIS — E785 Hyperlipidemia, unspecified: Secondary | ICD-10-CM | POA: Diagnosis not present

## 2018-03-31 DIAGNOSIS — I10 Essential (primary) hypertension: Secondary | ICD-10-CM | POA: Diagnosis not present

## 2018-03-31 DIAGNOSIS — F330 Major depressive disorder, recurrent, mild: Secondary | ICD-10-CM | POA: Diagnosis not present

## 2018-03-31 DIAGNOSIS — I4891 Unspecified atrial fibrillation: Secondary | ICD-10-CM | POA: Diagnosis not present

## 2018-03-31 DIAGNOSIS — I519 Heart disease, unspecified: Secondary | ICD-10-CM | POA: Diagnosis not present

## 2018-04-01 DIAGNOSIS — I519 Heart disease, unspecified: Secondary | ICD-10-CM | POA: Diagnosis not present

## 2018-04-01 DIAGNOSIS — E039 Hypothyroidism, unspecified: Secondary | ICD-10-CM | POA: Diagnosis not present

## 2018-04-01 DIAGNOSIS — I10 Essential (primary) hypertension: Secondary | ICD-10-CM | POA: Diagnosis not present

## 2018-04-01 DIAGNOSIS — I4891 Unspecified atrial fibrillation: Secondary | ICD-10-CM | POA: Diagnosis not present

## 2018-04-01 DIAGNOSIS — E785 Hyperlipidemia, unspecified: Secondary | ICD-10-CM | POA: Diagnosis not present

## 2018-04-01 DIAGNOSIS — F330 Major depressive disorder, recurrent, mild: Secondary | ICD-10-CM | POA: Diagnosis not present

## 2018-04-02 DIAGNOSIS — F330 Major depressive disorder, recurrent, mild: Secondary | ICD-10-CM | POA: Diagnosis not present

## 2018-04-02 DIAGNOSIS — I4891 Unspecified atrial fibrillation: Secondary | ICD-10-CM | POA: Diagnosis not present

## 2018-04-02 DIAGNOSIS — E785 Hyperlipidemia, unspecified: Secondary | ICD-10-CM | POA: Diagnosis not present

## 2018-04-02 DIAGNOSIS — I519 Heart disease, unspecified: Secondary | ICD-10-CM | POA: Diagnosis not present

## 2018-04-02 DIAGNOSIS — I10 Essential (primary) hypertension: Secondary | ICD-10-CM | POA: Diagnosis not present

## 2018-04-02 DIAGNOSIS — E039 Hypothyroidism, unspecified: Secondary | ICD-10-CM | POA: Diagnosis not present

## 2018-04-05 DIAGNOSIS — I10 Essential (primary) hypertension: Secondary | ICD-10-CM | POA: Diagnosis not present

## 2018-04-05 DIAGNOSIS — E039 Hypothyroidism, unspecified: Secondary | ICD-10-CM | POA: Diagnosis not present

## 2018-04-05 DIAGNOSIS — I4891 Unspecified atrial fibrillation: Secondary | ICD-10-CM | POA: Diagnosis not present

## 2018-04-05 DIAGNOSIS — I519 Heart disease, unspecified: Secondary | ICD-10-CM | POA: Diagnosis not present

## 2018-04-05 DIAGNOSIS — E785 Hyperlipidemia, unspecified: Secondary | ICD-10-CM | POA: Diagnosis not present

## 2018-04-05 DIAGNOSIS — F330 Major depressive disorder, recurrent, mild: Secondary | ICD-10-CM | POA: Diagnosis not present

## 2018-04-07 DIAGNOSIS — I519 Heart disease, unspecified: Secondary | ICD-10-CM | POA: Diagnosis not present

## 2018-04-07 DIAGNOSIS — E039 Hypothyroidism, unspecified: Secondary | ICD-10-CM | POA: Diagnosis not present

## 2018-04-07 DIAGNOSIS — I10 Essential (primary) hypertension: Secondary | ICD-10-CM | POA: Diagnosis not present

## 2018-04-07 DIAGNOSIS — I4891 Unspecified atrial fibrillation: Secondary | ICD-10-CM | POA: Diagnosis not present

## 2018-04-07 DIAGNOSIS — F330 Major depressive disorder, recurrent, mild: Secondary | ICD-10-CM | POA: Diagnosis not present

## 2018-04-07 DIAGNOSIS — E785 Hyperlipidemia, unspecified: Secondary | ICD-10-CM | POA: Diagnosis not present

## 2018-04-09 DIAGNOSIS — I519 Heart disease, unspecified: Secondary | ICD-10-CM | POA: Diagnosis not present

## 2018-04-09 DIAGNOSIS — I10 Essential (primary) hypertension: Secondary | ICD-10-CM | POA: Diagnosis not present

## 2018-04-09 DIAGNOSIS — E785 Hyperlipidemia, unspecified: Secondary | ICD-10-CM | POA: Diagnosis not present

## 2018-04-09 DIAGNOSIS — I4891 Unspecified atrial fibrillation: Secondary | ICD-10-CM | POA: Diagnosis not present

## 2018-04-09 DIAGNOSIS — E039 Hypothyroidism, unspecified: Secondary | ICD-10-CM | POA: Diagnosis not present

## 2018-04-09 DIAGNOSIS — F330 Major depressive disorder, recurrent, mild: Secondary | ICD-10-CM | POA: Diagnosis not present

## 2018-04-10 ENCOUNTER — Other Ambulatory Visit: Payer: Self-pay | Admitting: Family Medicine

## 2018-04-12 DIAGNOSIS — F330 Major depressive disorder, recurrent, mild: Secondary | ICD-10-CM | POA: Diagnosis not present

## 2018-04-12 DIAGNOSIS — I4891 Unspecified atrial fibrillation: Secondary | ICD-10-CM | POA: Diagnosis not present

## 2018-04-12 DIAGNOSIS — E785 Hyperlipidemia, unspecified: Secondary | ICD-10-CM | POA: Diagnosis not present

## 2018-04-12 DIAGNOSIS — I519 Heart disease, unspecified: Secondary | ICD-10-CM | POA: Diagnosis not present

## 2018-04-12 DIAGNOSIS — E039 Hypothyroidism, unspecified: Secondary | ICD-10-CM | POA: Diagnosis not present

## 2018-04-12 DIAGNOSIS — I10 Essential (primary) hypertension: Secondary | ICD-10-CM | POA: Diagnosis not present

## 2018-04-14 DIAGNOSIS — I4891 Unspecified atrial fibrillation: Secondary | ICD-10-CM | POA: Diagnosis not present

## 2018-04-14 DIAGNOSIS — I10 Essential (primary) hypertension: Secondary | ICD-10-CM | POA: Diagnosis not present

## 2018-04-14 DIAGNOSIS — E785 Hyperlipidemia, unspecified: Secondary | ICD-10-CM | POA: Diagnosis not present

## 2018-04-14 DIAGNOSIS — F330 Major depressive disorder, recurrent, mild: Secondary | ICD-10-CM | POA: Diagnosis not present

## 2018-04-14 DIAGNOSIS — I519 Heart disease, unspecified: Secondary | ICD-10-CM | POA: Diagnosis not present

## 2018-04-14 DIAGNOSIS — E039 Hypothyroidism, unspecified: Secondary | ICD-10-CM | POA: Diagnosis not present

## 2018-04-15 DIAGNOSIS — F330 Major depressive disorder, recurrent, mild: Secondary | ICD-10-CM | POA: Diagnosis not present

## 2018-04-15 DIAGNOSIS — E785 Hyperlipidemia, unspecified: Secondary | ICD-10-CM | POA: Diagnosis not present

## 2018-04-15 DIAGNOSIS — I10 Essential (primary) hypertension: Secondary | ICD-10-CM | POA: Diagnosis not present

## 2018-04-15 DIAGNOSIS — E039 Hypothyroidism, unspecified: Secondary | ICD-10-CM | POA: Diagnosis not present

## 2018-04-15 DIAGNOSIS — I4891 Unspecified atrial fibrillation: Secondary | ICD-10-CM | POA: Diagnosis not present

## 2018-04-15 DIAGNOSIS — I519 Heart disease, unspecified: Secondary | ICD-10-CM | POA: Diagnosis not present

## 2018-04-16 DIAGNOSIS — I4891 Unspecified atrial fibrillation: Secondary | ICD-10-CM | POA: Diagnosis not present

## 2018-04-16 DIAGNOSIS — I10 Essential (primary) hypertension: Secondary | ICD-10-CM | POA: Diagnosis not present

## 2018-04-16 DIAGNOSIS — E039 Hypothyroidism, unspecified: Secondary | ICD-10-CM | POA: Diagnosis not present

## 2018-04-16 DIAGNOSIS — F330 Major depressive disorder, recurrent, mild: Secondary | ICD-10-CM | POA: Diagnosis not present

## 2018-04-16 DIAGNOSIS — E785 Hyperlipidemia, unspecified: Secondary | ICD-10-CM | POA: Diagnosis not present

## 2018-04-16 DIAGNOSIS — I519 Heart disease, unspecified: Secondary | ICD-10-CM | POA: Diagnosis not present

## 2018-04-19 DIAGNOSIS — I10 Essential (primary) hypertension: Secondary | ICD-10-CM | POA: Diagnosis not present

## 2018-04-19 DIAGNOSIS — I519 Heart disease, unspecified: Secondary | ICD-10-CM | POA: Diagnosis not present

## 2018-04-19 DIAGNOSIS — F330 Major depressive disorder, recurrent, mild: Secondary | ICD-10-CM | POA: Diagnosis not present

## 2018-04-19 DIAGNOSIS — I4891 Unspecified atrial fibrillation: Secondary | ICD-10-CM | POA: Diagnosis not present

## 2018-04-19 DIAGNOSIS — E039 Hypothyroidism, unspecified: Secondary | ICD-10-CM | POA: Diagnosis not present

## 2018-04-19 DIAGNOSIS — E785 Hyperlipidemia, unspecified: Secondary | ICD-10-CM | POA: Diagnosis not present

## 2018-04-21 DIAGNOSIS — I10 Essential (primary) hypertension: Secondary | ICD-10-CM | POA: Diagnosis not present

## 2018-04-21 DIAGNOSIS — E039 Hypothyroidism, unspecified: Secondary | ICD-10-CM | POA: Diagnosis not present

## 2018-04-21 DIAGNOSIS — I4891 Unspecified atrial fibrillation: Secondary | ICD-10-CM | POA: Diagnosis not present

## 2018-04-21 DIAGNOSIS — F330 Major depressive disorder, recurrent, mild: Secondary | ICD-10-CM | POA: Diagnosis not present

## 2018-04-21 DIAGNOSIS — I519 Heart disease, unspecified: Secondary | ICD-10-CM | POA: Diagnosis not present

## 2018-04-21 DIAGNOSIS — E785 Hyperlipidemia, unspecified: Secondary | ICD-10-CM | POA: Diagnosis not present

## 2018-04-23 DIAGNOSIS — F330 Major depressive disorder, recurrent, mild: Secondary | ICD-10-CM | POA: Diagnosis not present

## 2018-04-23 DIAGNOSIS — I10 Essential (primary) hypertension: Secondary | ICD-10-CM | POA: Diagnosis not present

## 2018-04-23 DIAGNOSIS — E039 Hypothyroidism, unspecified: Secondary | ICD-10-CM | POA: Diagnosis not present

## 2018-04-23 DIAGNOSIS — I4891 Unspecified atrial fibrillation: Secondary | ICD-10-CM | POA: Diagnosis not present

## 2018-04-23 DIAGNOSIS — I519 Heart disease, unspecified: Secondary | ICD-10-CM | POA: Diagnosis not present

## 2018-04-23 DIAGNOSIS — E785 Hyperlipidemia, unspecified: Secondary | ICD-10-CM | POA: Diagnosis not present

## 2018-04-26 DIAGNOSIS — I10 Essential (primary) hypertension: Secondary | ICD-10-CM | POA: Diagnosis not present

## 2018-04-26 DIAGNOSIS — E039 Hypothyroidism, unspecified: Secondary | ICD-10-CM | POA: Diagnosis not present

## 2018-04-26 DIAGNOSIS — I4891 Unspecified atrial fibrillation: Secondary | ICD-10-CM | POA: Diagnosis not present

## 2018-04-26 DIAGNOSIS — F330 Major depressive disorder, recurrent, mild: Secondary | ICD-10-CM | POA: Diagnosis not present

## 2018-04-26 DIAGNOSIS — E785 Hyperlipidemia, unspecified: Secondary | ICD-10-CM | POA: Diagnosis not present

## 2018-04-26 DIAGNOSIS — I519 Heart disease, unspecified: Secondary | ICD-10-CM | POA: Diagnosis not present

## 2018-04-27 DIAGNOSIS — R0602 Shortness of breath: Secondary | ICD-10-CM | POA: Diagnosis not present

## 2018-04-27 DIAGNOSIS — E039 Hypothyroidism, unspecified: Secondary | ICD-10-CM | POA: Diagnosis not present

## 2018-04-27 DIAGNOSIS — E785 Hyperlipidemia, unspecified: Secondary | ICD-10-CM | POA: Diagnosis not present

## 2018-04-27 DIAGNOSIS — I4891 Unspecified atrial fibrillation: Secondary | ICD-10-CM | POA: Diagnosis not present

## 2018-04-27 DIAGNOSIS — I519 Heart disease, unspecified: Secondary | ICD-10-CM | POA: Diagnosis not present

## 2018-04-27 DIAGNOSIS — R42 Dizziness and giddiness: Secondary | ICD-10-CM | POA: Diagnosis not present

## 2018-04-27 DIAGNOSIS — R52 Pain, unspecified: Secondary | ICD-10-CM | POA: Diagnosis not present

## 2018-04-27 DIAGNOSIS — R531 Weakness: Secondary | ICD-10-CM | POA: Diagnosis not present

## 2018-04-27 DIAGNOSIS — F330 Major depressive disorder, recurrent, mild: Secondary | ICD-10-CM | POA: Diagnosis not present

## 2018-04-27 DIAGNOSIS — I10 Essential (primary) hypertension: Secondary | ICD-10-CM | POA: Diagnosis not present

## 2018-04-28 DIAGNOSIS — I519 Heart disease, unspecified: Secondary | ICD-10-CM | POA: Diagnosis not present

## 2018-04-28 DIAGNOSIS — E039 Hypothyroidism, unspecified: Secondary | ICD-10-CM | POA: Diagnosis not present

## 2018-04-28 DIAGNOSIS — E785 Hyperlipidemia, unspecified: Secondary | ICD-10-CM | POA: Diagnosis not present

## 2018-04-28 DIAGNOSIS — I4891 Unspecified atrial fibrillation: Secondary | ICD-10-CM | POA: Diagnosis not present

## 2018-04-28 DIAGNOSIS — F330 Major depressive disorder, recurrent, mild: Secondary | ICD-10-CM | POA: Diagnosis not present

## 2018-04-28 DIAGNOSIS — I10 Essential (primary) hypertension: Secondary | ICD-10-CM | POA: Diagnosis not present

## 2018-04-30 DIAGNOSIS — I519 Heart disease, unspecified: Secondary | ICD-10-CM | POA: Diagnosis not present

## 2018-04-30 DIAGNOSIS — E039 Hypothyroidism, unspecified: Secondary | ICD-10-CM | POA: Diagnosis not present

## 2018-04-30 DIAGNOSIS — E785 Hyperlipidemia, unspecified: Secondary | ICD-10-CM | POA: Diagnosis not present

## 2018-04-30 DIAGNOSIS — F330 Major depressive disorder, recurrent, mild: Secondary | ICD-10-CM | POA: Diagnosis not present

## 2018-04-30 DIAGNOSIS — I10 Essential (primary) hypertension: Secondary | ICD-10-CM | POA: Diagnosis not present

## 2018-04-30 DIAGNOSIS — I4891 Unspecified atrial fibrillation: Secondary | ICD-10-CM | POA: Diagnosis not present

## 2018-05-03 DIAGNOSIS — E039 Hypothyroidism, unspecified: Secondary | ICD-10-CM | POA: Diagnosis not present

## 2018-05-03 DIAGNOSIS — I519 Heart disease, unspecified: Secondary | ICD-10-CM | POA: Diagnosis not present

## 2018-05-03 DIAGNOSIS — E785 Hyperlipidemia, unspecified: Secondary | ICD-10-CM | POA: Diagnosis not present

## 2018-05-03 DIAGNOSIS — I4891 Unspecified atrial fibrillation: Secondary | ICD-10-CM | POA: Diagnosis not present

## 2018-05-03 DIAGNOSIS — F330 Major depressive disorder, recurrent, mild: Secondary | ICD-10-CM | POA: Diagnosis not present

## 2018-05-03 DIAGNOSIS — I10 Essential (primary) hypertension: Secondary | ICD-10-CM | POA: Diagnosis not present

## 2018-05-05 DIAGNOSIS — I4891 Unspecified atrial fibrillation: Secondary | ICD-10-CM | POA: Diagnosis not present

## 2018-05-05 DIAGNOSIS — E039 Hypothyroidism, unspecified: Secondary | ICD-10-CM | POA: Diagnosis not present

## 2018-05-05 DIAGNOSIS — I519 Heart disease, unspecified: Secondary | ICD-10-CM | POA: Diagnosis not present

## 2018-05-05 DIAGNOSIS — F330 Major depressive disorder, recurrent, mild: Secondary | ICD-10-CM | POA: Diagnosis not present

## 2018-05-05 DIAGNOSIS — E785 Hyperlipidemia, unspecified: Secondary | ICD-10-CM | POA: Diagnosis not present

## 2018-05-05 DIAGNOSIS — I10 Essential (primary) hypertension: Secondary | ICD-10-CM | POA: Diagnosis not present

## 2018-05-06 DIAGNOSIS — E039 Hypothyroidism, unspecified: Secondary | ICD-10-CM | POA: Diagnosis not present

## 2018-05-06 DIAGNOSIS — E785 Hyperlipidemia, unspecified: Secondary | ICD-10-CM | POA: Diagnosis not present

## 2018-05-06 DIAGNOSIS — I10 Essential (primary) hypertension: Secondary | ICD-10-CM | POA: Diagnosis not present

## 2018-05-06 DIAGNOSIS — I519 Heart disease, unspecified: Secondary | ICD-10-CM | POA: Diagnosis not present

## 2018-05-06 DIAGNOSIS — F330 Major depressive disorder, recurrent, mild: Secondary | ICD-10-CM | POA: Diagnosis not present

## 2018-05-06 DIAGNOSIS — I4891 Unspecified atrial fibrillation: Secondary | ICD-10-CM | POA: Diagnosis not present

## 2018-05-07 DIAGNOSIS — I519 Heart disease, unspecified: Secondary | ICD-10-CM | POA: Diagnosis not present

## 2018-05-07 DIAGNOSIS — I4891 Unspecified atrial fibrillation: Secondary | ICD-10-CM | POA: Diagnosis not present

## 2018-05-07 DIAGNOSIS — E785 Hyperlipidemia, unspecified: Secondary | ICD-10-CM | POA: Diagnosis not present

## 2018-05-07 DIAGNOSIS — E039 Hypothyroidism, unspecified: Secondary | ICD-10-CM | POA: Diagnosis not present

## 2018-05-07 DIAGNOSIS — I10 Essential (primary) hypertension: Secondary | ICD-10-CM | POA: Diagnosis not present

## 2018-05-07 DIAGNOSIS — F330 Major depressive disorder, recurrent, mild: Secondary | ICD-10-CM | POA: Diagnosis not present

## 2018-05-10 DIAGNOSIS — I4891 Unspecified atrial fibrillation: Secondary | ICD-10-CM | POA: Diagnosis not present

## 2018-05-10 DIAGNOSIS — E039 Hypothyroidism, unspecified: Secondary | ICD-10-CM | POA: Diagnosis not present

## 2018-05-10 DIAGNOSIS — I10 Essential (primary) hypertension: Secondary | ICD-10-CM | POA: Diagnosis not present

## 2018-05-10 DIAGNOSIS — I519 Heart disease, unspecified: Secondary | ICD-10-CM | POA: Diagnosis not present

## 2018-05-10 DIAGNOSIS — F330 Major depressive disorder, recurrent, mild: Secondary | ICD-10-CM | POA: Diagnosis not present

## 2018-05-10 DIAGNOSIS — E785 Hyperlipidemia, unspecified: Secondary | ICD-10-CM | POA: Diagnosis not present

## 2018-05-12 DIAGNOSIS — F330 Major depressive disorder, recurrent, mild: Secondary | ICD-10-CM | POA: Diagnosis not present

## 2018-05-12 DIAGNOSIS — E039 Hypothyroidism, unspecified: Secondary | ICD-10-CM | POA: Diagnosis not present

## 2018-05-12 DIAGNOSIS — I4891 Unspecified atrial fibrillation: Secondary | ICD-10-CM | POA: Diagnosis not present

## 2018-05-12 DIAGNOSIS — I10 Essential (primary) hypertension: Secondary | ICD-10-CM | POA: Diagnosis not present

## 2018-05-12 DIAGNOSIS — E785 Hyperlipidemia, unspecified: Secondary | ICD-10-CM | POA: Diagnosis not present

## 2018-05-12 DIAGNOSIS — I519 Heart disease, unspecified: Secondary | ICD-10-CM | POA: Diagnosis not present

## 2018-05-13 IMAGING — US US CAROTID DUPLEX BILAT
1 series · 13 of 24 positions shown · non-contrast
Comparison: Prior head CT 09/15/2017

CLINICAL DATA: 72-year-old female with syncope

EXAM:
BILATERAL CAROTID DUPLEX ULTRASOUND
TECHNIQUE: Gray scale imaging, color Doppler and duplex ultrasound were
performed of bilateral carotid and vertebral arteries in the neck.

[Series 1: us carotid duplex bilat · 0.06mm/px · 13 of 71 slices shown]
[im 1/71]
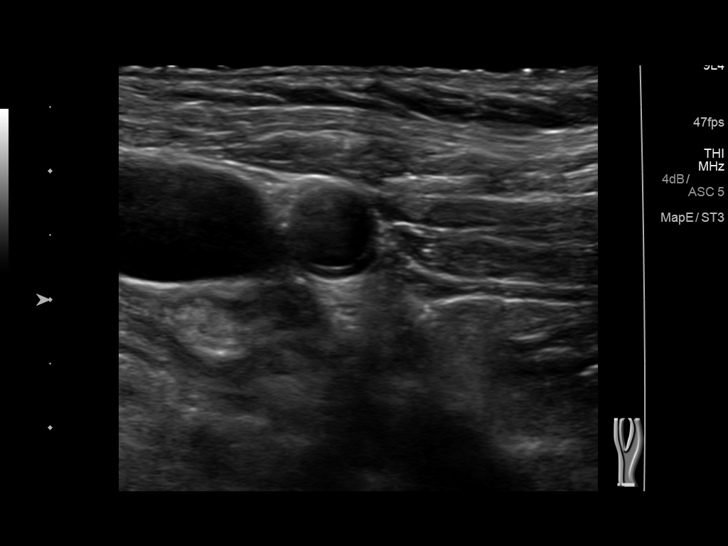
[im 7/71]
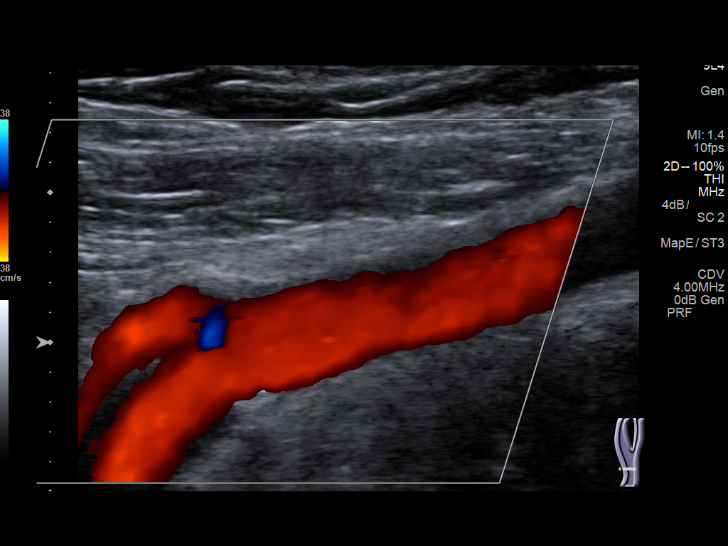
[im 13/71]
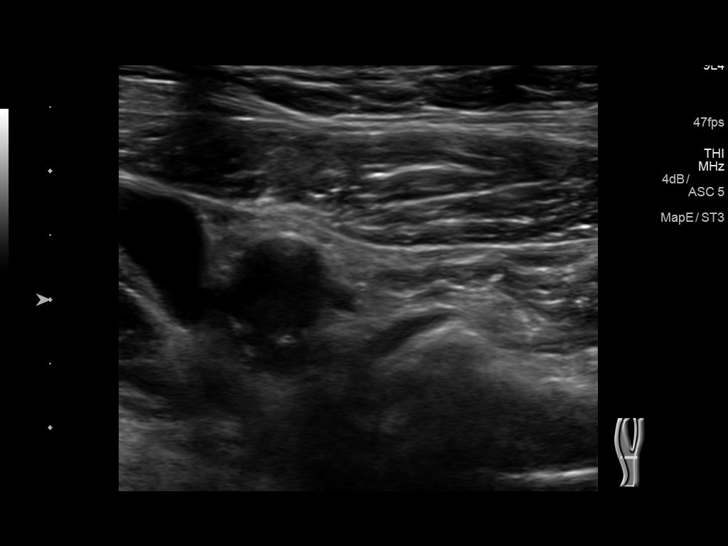
[im 19/71]
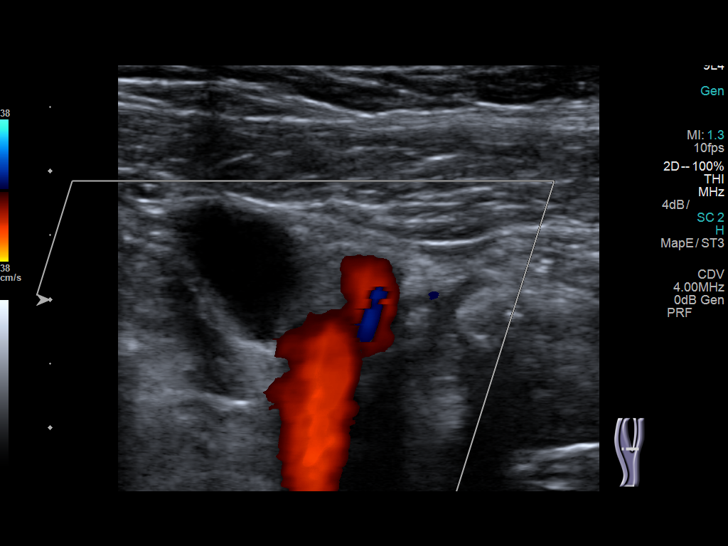
[im 25/71]
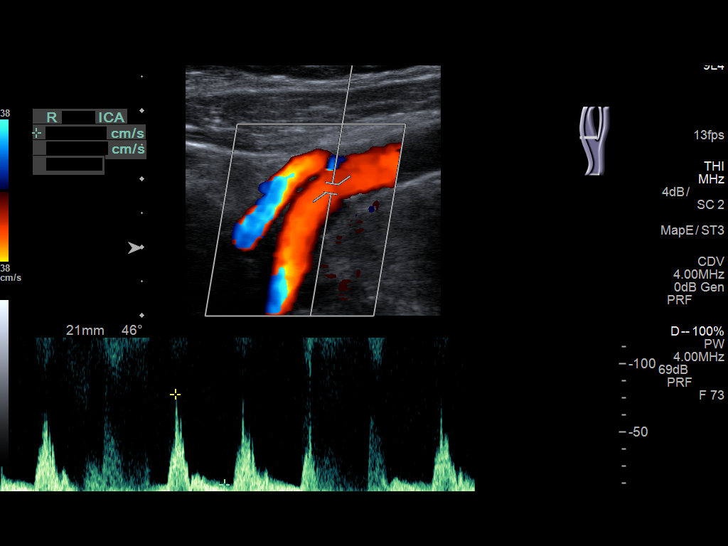
[im 31/71]
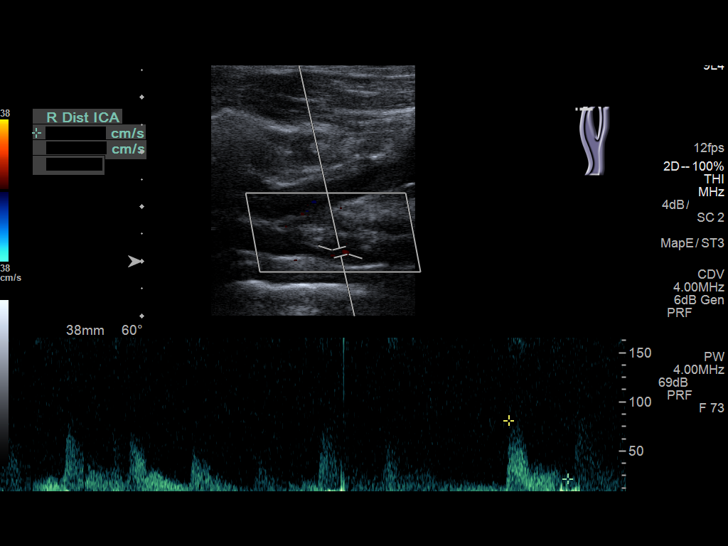
[im 37/71]
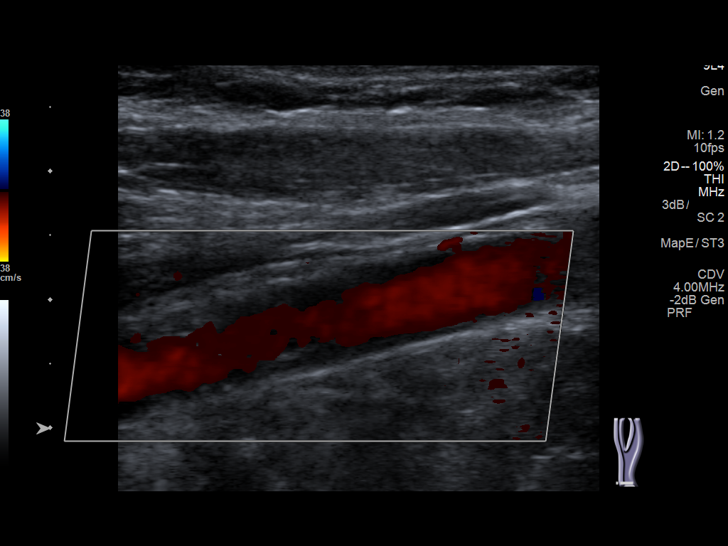
[im 40/71]
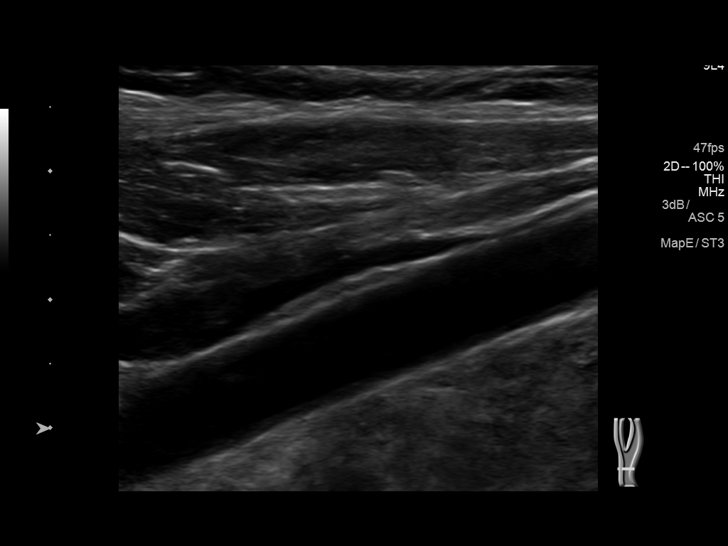
[im 46/71]
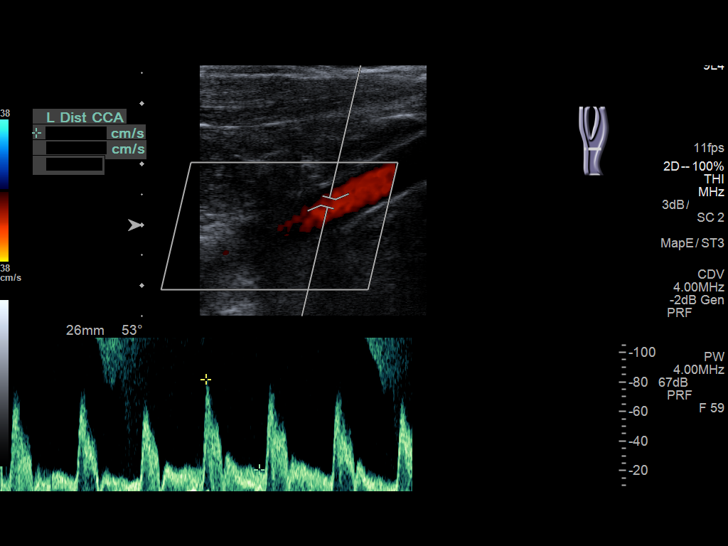
[im 52/71]
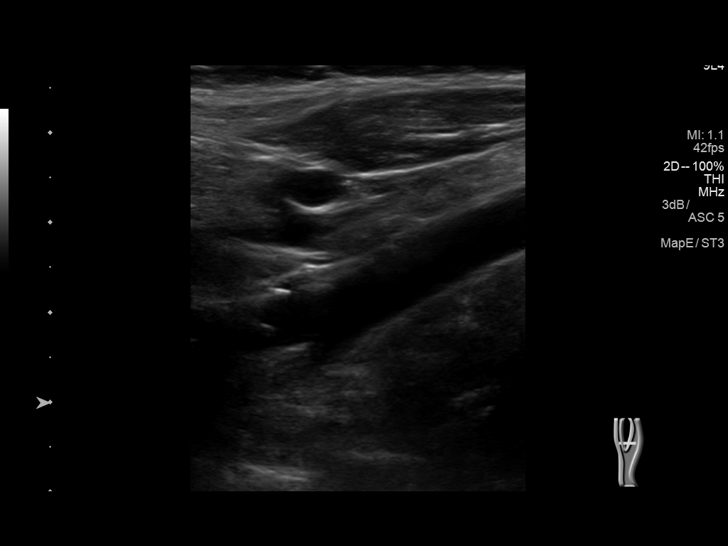
[im 58/71]
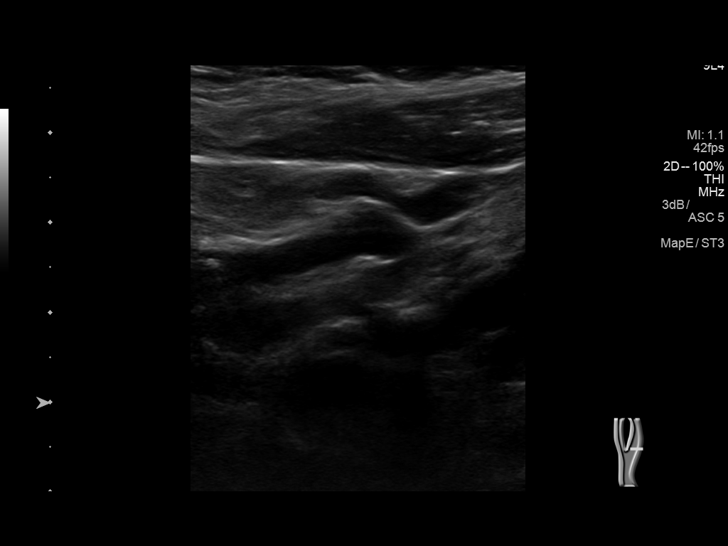
[im 64/71]
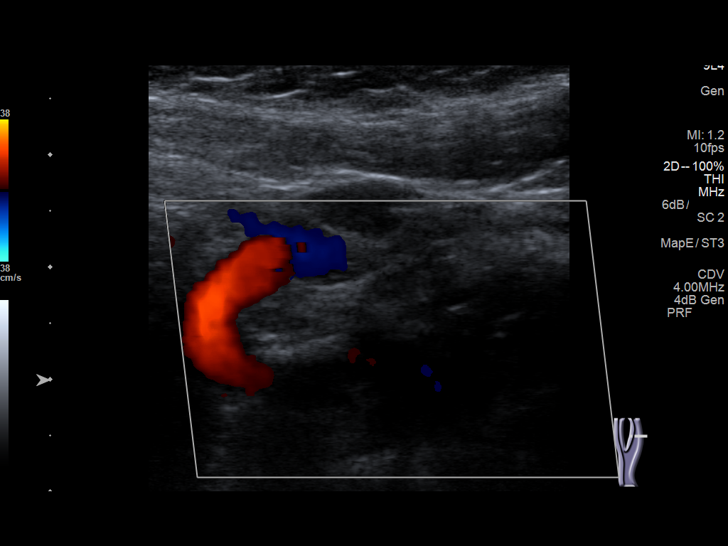
[im 71/71]
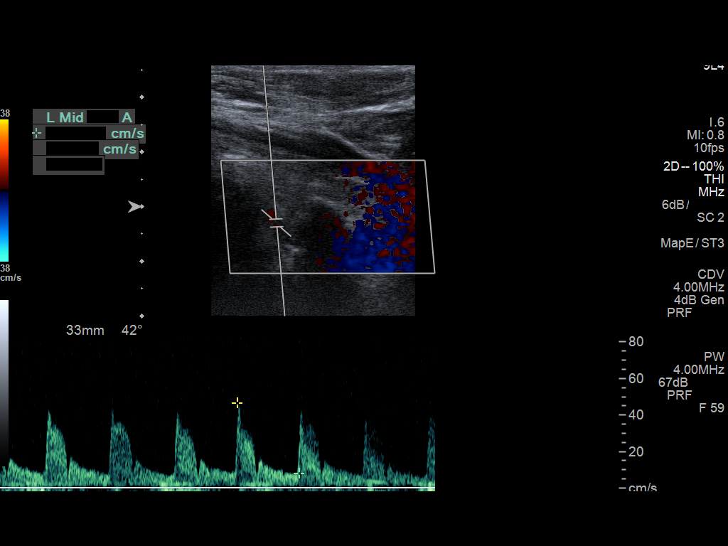

[13 of 24 positions shown; findings below may reference images not displayed]

FINDINGS: Criteria: Quantification of carotid stenosis is based on velocity
parameters that correlate the residual internal carotid diameter
with NASCET-based stenosis levels, using the diameter of the distal
internal carotid lumen as the denominator for stenosis measurement.

The following velocity measurements were obtained:

RIGHT

ICA:  81/47 cm/sec

CCA:  148/18 cm/sec

SYSTOLIC ICA/CCA RATIO:

DIASTOLIC ICA/CCA RATIO:

ECA:  66 cm/sec

LEFT

ICA:  177/33 cm/sec

CCA:  97/23 cm/sec

SYSTOLIC ICA/CCA RATIO:

DIASTOLIC ICA/CCA RATIO:

ECA:  83 cm/sec

RIGHT CAROTID ARTERY: Trace heterogeneous atherosclerotic plaque. By
peak systolic velocity criteria, the estimated stenosis is less than
50%.

RIGHT VERTEBRAL ARTERY:  Patent with normal antegrade flow.

LEFT CAROTID ARTERY: Highly tortuous vascular anatomy. No
significant atherosclerotic plaque. Spurious elevation of the peak
systolic velocity in the tortuous segments.

LEFT VERTEBRAL ARTERY: Patent with normal antegrade flow.
IMPRESSION: 1. Mild (1-49%) stenosis proximal right internal carotid artery
secondary to mild heterogeneous atherosclerotic plaque.
2. No significant atherosclerotic plaque or evidence of tortuosity
on the left. Of note, the left internal carotid artery is highly
tortuous.
3. Vertebral arteries remain patent with antegrade flow.

## 2018-05-14 DIAGNOSIS — I10 Essential (primary) hypertension: Secondary | ICD-10-CM | POA: Diagnosis not present

## 2018-05-14 DIAGNOSIS — E785 Hyperlipidemia, unspecified: Secondary | ICD-10-CM | POA: Diagnosis not present

## 2018-05-14 DIAGNOSIS — I4891 Unspecified atrial fibrillation: Secondary | ICD-10-CM | POA: Diagnosis not present

## 2018-05-14 DIAGNOSIS — F330 Major depressive disorder, recurrent, mild: Secondary | ICD-10-CM | POA: Diagnosis not present

## 2018-05-14 DIAGNOSIS — E039 Hypothyroidism, unspecified: Secondary | ICD-10-CM | POA: Diagnosis not present

## 2018-05-14 DIAGNOSIS — I519 Heart disease, unspecified: Secondary | ICD-10-CM | POA: Diagnosis not present

## 2018-05-17 ENCOUNTER — Ambulatory Visit (INDEPENDENT_AMBULATORY_CARE_PROVIDER_SITE_OTHER): Payer: Medicare Other | Admitting: *Deleted

## 2018-05-17 DIAGNOSIS — Z111 Encounter for screening for respiratory tuberculosis: Secondary | ICD-10-CM | POA: Diagnosis not present

## 2018-05-17 DIAGNOSIS — Z23 Encounter for immunization: Secondary | ICD-10-CM

## 2018-05-17 NOTE — Progress Notes (Signed)
PPD placed R arm Pt tolerated well

## 2018-05-18 DIAGNOSIS — I519 Heart disease, unspecified: Secondary | ICD-10-CM | POA: Diagnosis not present

## 2018-05-18 DIAGNOSIS — I4891 Unspecified atrial fibrillation: Secondary | ICD-10-CM | POA: Diagnosis not present

## 2018-05-18 DIAGNOSIS — E785 Hyperlipidemia, unspecified: Secondary | ICD-10-CM | POA: Diagnosis not present

## 2018-05-18 DIAGNOSIS — F330 Major depressive disorder, recurrent, mild: Secondary | ICD-10-CM | POA: Diagnosis not present

## 2018-05-18 DIAGNOSIS — E039 Hypothyroidism, unspecified: Secondary | ICD-10-CM | POA: Diagnosis not present

## 2018-05-18 DIAGNOSIS — I10 Essential (primary) hypertension: Secondary | ICD-10-CM | POA: Diagnosis not present

## 2018-05-19 ENCOUNTER — Ambulatory Visit (INDEPENDENT_AMBULATORY_CARE_PROVIDER_SITE_OTHER)

## 2018-05-19 DIAGNOSIS — Z23 Encounter for immunization: Secondary | ICD-10-CM

## 2018-05-19 LAB — TB SKIN TEST
Induration: 0 mm
TB Skin Test: NEGATIVE

## 2018-05-25 DIAGNOSIS — M150 Primary generalized (osteo)arthritis: Secondary | ICD-10-CM | POA: Diagnosis not present

## 2018-05-25 DIAGNOSIS — F331 Major depressive disorder, recurrent, moderate: Secondary | ICD-10-CM | POA: Diagnosis not present

## 2018-05-25 DIAGNOSIS — I10 Essential (primary) hypertension: Secondary | ICD-10-CM | POA: Diagnosis not present

## 2018-05-25 DIAGNOSIS — I482 Chronic atrial fibrillation, unspecified: Secondary | ICD-10-CM | POA: Diagnosis not present

## 2018-05-27 DIAGNOSIS — Z79899 Other long term (current) drug therapy: Secondary | ICD-10-CM | POA: Diagnosis not present

## 2018-05-28 DIAGNOSIS — G894 Chronic pain syndrome: Secondary | ICD-10-CM | POA: Diagnosis not present

## 2018-05-28 DIAGNOSIS — R0989 Other specified symptoms and signs involving the circulatory and respiratory systems: Secondary | ICD-10-CM | POA: Diagnosis not present

## 2018-05-28 DIAGNOSIS — R05 Cough: Secondary | ICD-10-CM | POA: Diagnosis not present

## 2018-05-28 DIAGNOSIS — S46812S Strain of other muscles, fascia and tendons at shoulder and upper arm level, left arm, sequela: Secondary | ICD-10-CM | POA: Diagnosis not present

## 2018-06-23 ENCOUNTER — Ambulatory Visit (INDEPENDENT_AMBULATORY_CARE_PROVIDER_SITE_OTHER)

## 2018-06-23 DIAGNOSIS — E785 Hyperlipidemia, unspecified: Secondary | ICD-10-CM | POA: Diagnosis not present

## 2018-06-23 DIAGNOSIS — R0602 Shortness of breath: Secondary | ICD-10-CM

## 2018-06-23 DIAGNOSIS — I4891 Unspecified atrial fibrillation: Secondary | ICD-10-CM

## 2018-06-23 DIAGNOSIS — R531 Weakness: Secondary | ICD-10-CM

## 2018-06-23 DIAGNOSIS — F330 Major depressive disorder, recurrent, mild: Secondary | ICD-10-CM

## 2018-06-23 DIAGNOSIS — I10 Essential (primary) hypertension: Secondary | ICD-10-CM | POA: Diagnosis not present

## 2018-06-23 DIAGNOSIS — R52 Pain, unspecified: Secondary | ICD-10-CM

## 2018-06-23 DIAGNOSIS — I519 Heart disease, unspecified: Secondary | ICD-10-CM | POA: Diagnosis not present

## 2018-06-23 DIAGNOSIS — R42 Dizziness and giddiness: Secondary | ICD-10-CM

## 2018-06-23 DIAGNOSIS — E039 Hypothyroidism, unspecified: Secondary | ICD-10-CM

## 2018-06-25 ENCOUNTER — Ambulatory Visit

## 2018-07-06 IMAGING — DX DG CHEST 2V
2 series · 2 of 2 positions shown · non-contrast
Comparison: CT chest 04/29/2017. PA and lateral chest 08/24/2017
and 04/28/2017.

CLINICAL DATA: Headache, dizziness and nausea since a fall 8 months
ago.

EXAM:
CHEST  2 VIEW

[chest pa]
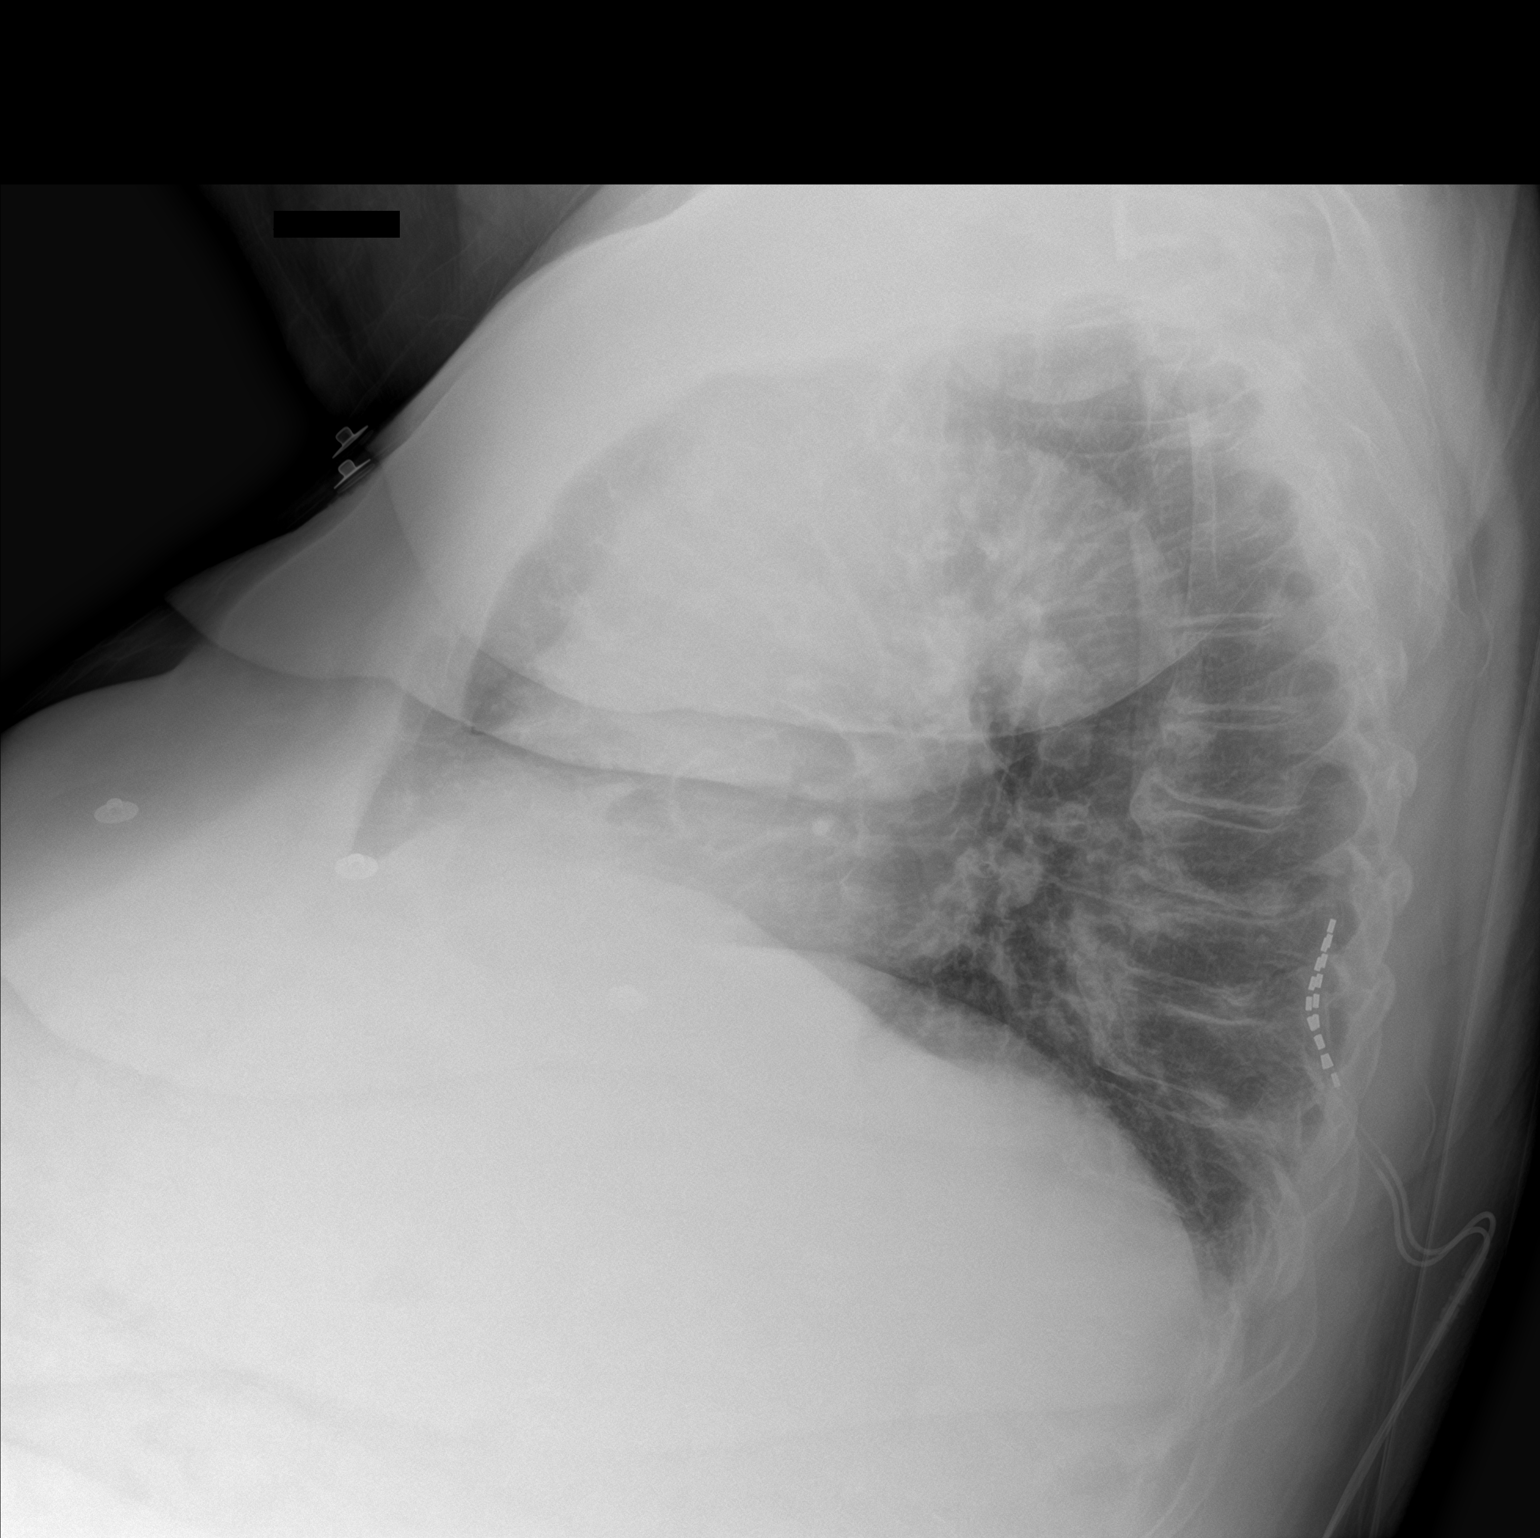

[chest ap]
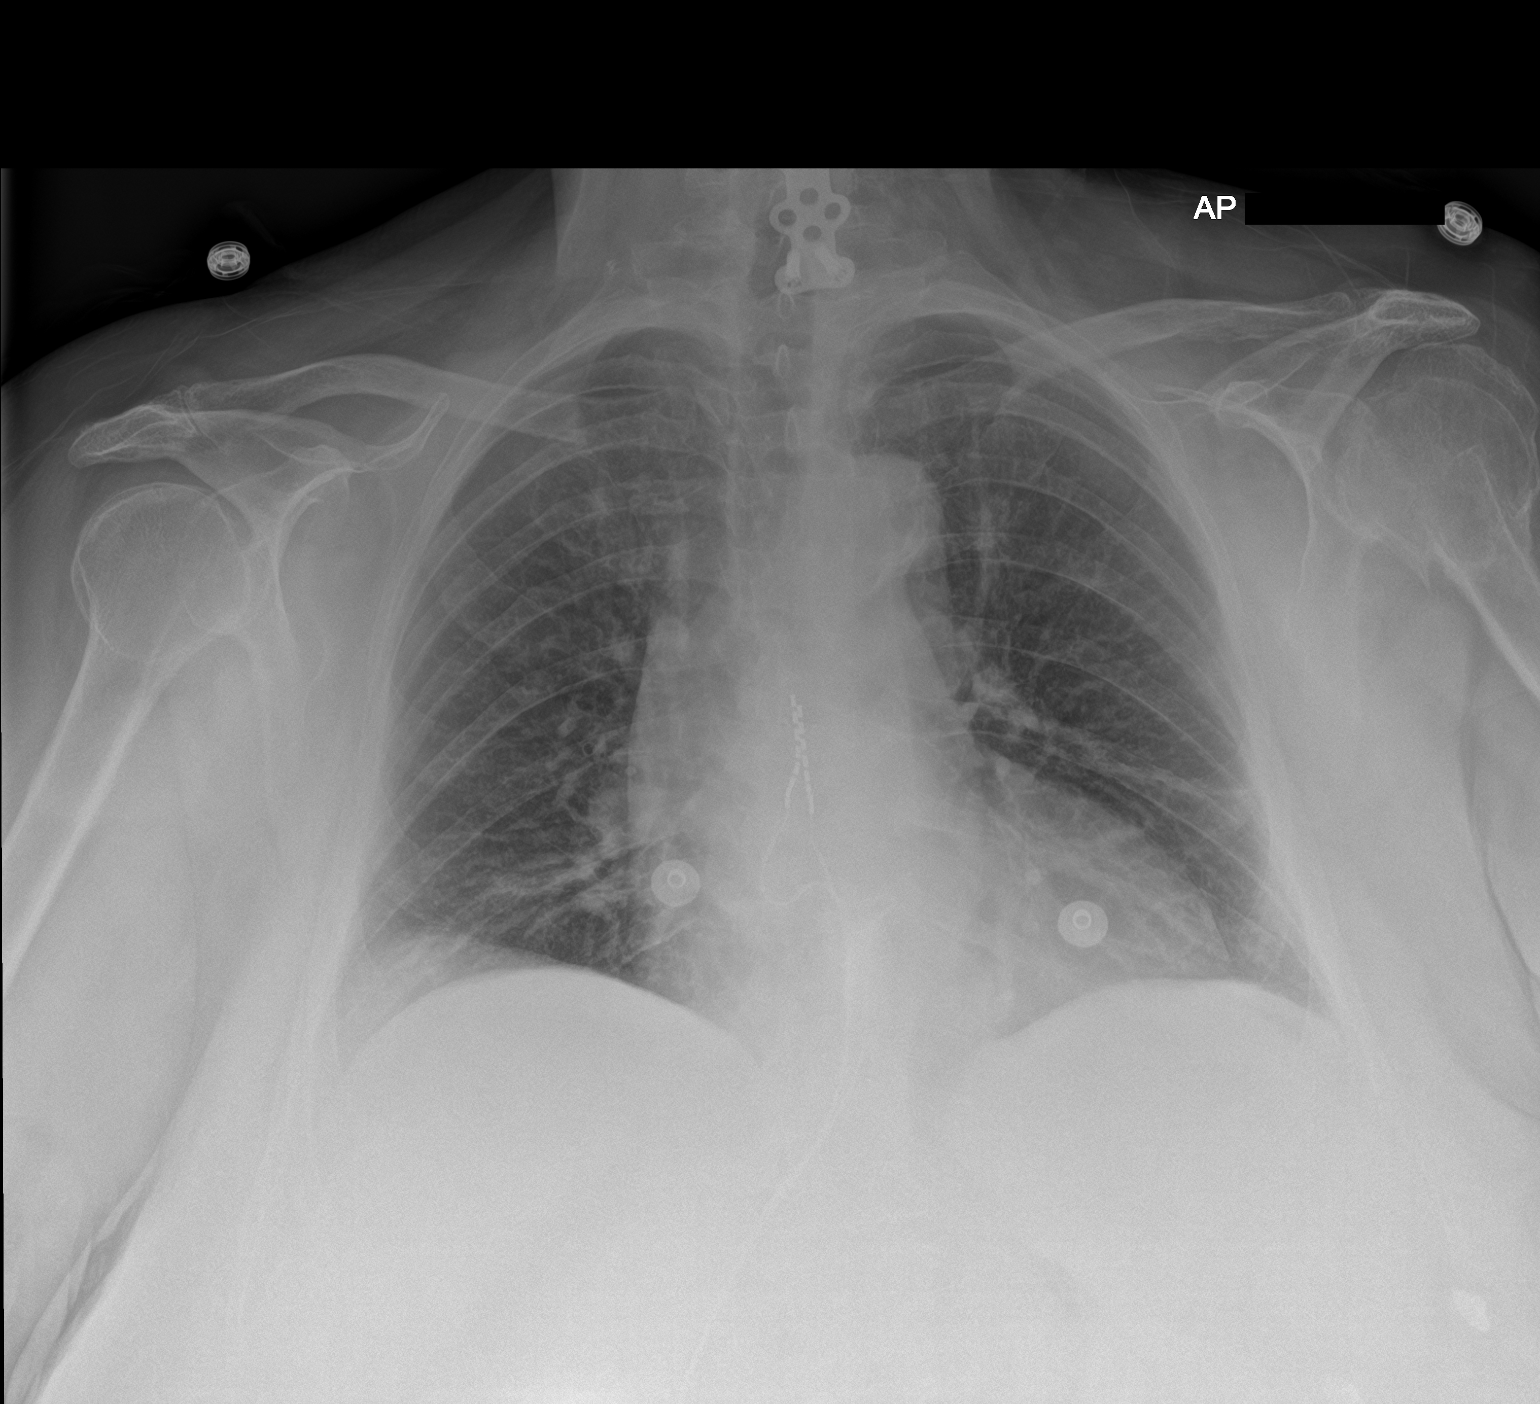

[2 of 2 positions shown; findings below may reference images not displayed]

FINDINGS: Mild linear atelectasis is seen in the lingula and right base. No
consolidative process, edema, pneumothorax or effusion. Heart size
is upper normal. Aortic atherosclerosis is seen. Spinal stimulator
is in place. No acute bony abnormality. Remote surgical neck
fracture left humerus is noted.
IMPRESSION: No acute disease.

Atherosclerosis.

## 2018-07-27 DIAGNOSIS — I10 Essential (primary) hypertension: Secondary | ICD-10-CM | POA: Diagnosis not present

## 2018-07-27 DIAGNOSIS — I714 Abdominal aortic aneurysm, without rupture: Secondary | ICD-10-CM | POA: Diagnosis not present

## 2018-07-27 DIAGNOSIS — G894 Chronic pain syndrome: Secondary | ICD-10-CM | POA: Diagnosis not present

## 2018-07-27 DIAGNOSIS — K5901 Slow transit constipation: Secondary | ICD-10-CM | POA: Diagnosis not present

## 2018-08-07 ENCOUNTER — Other Ambulatory Visit: Payer: Self-pay | Admitting: Family Medicine

## 2018-08-11 ENCOUNTER — Telehealth: Payer: Self-pay | Admitting: Gastroenterology

## 2018-08-11 NOTE — Telephone Encounter (Signed)
I spoke with Delcie Roch and advised that she will need to send [place] office notes and consult notes on the patient for review.

## 2018-08-11 NOTE — Telephone Encounter (Signed)
Referral will be in file folder until consult notes have been received to be reviewed.

## 2018-08-11 NOTE — Telephone Encounter (Signed)
We need additional records please

## 2018-08-11 NOTE — Telephone Encounter (Signed)
Sent to attn 08/11/2018 AM DOD Dr.Beavers to review and advise as to scheduling.  Shirley Leblanc is the scheduler at [facility] [phone].

## 2018-08-13 DIAGNOSIS — R062 Wheezing: Secondary | ICD-10-CM | POA: Diagnosis not present

## 2018-08-23 ENCOUNTER — Other Ambulatory Visit: Payer: Self-pay | Admitting: Family Medicine

## 2018-08-24 ENCOUNTER — Other Ambulatory Visit: Payer: Self-pay | Admitting: Family Medicine

## 2018-09-04 DIAGNOSIS — R350 Frequency of micturition: Secondary | ICD-10-CM | POA: Diagnosis not present

## 2018-09-28 DIAGNOSIS — F419 Anxiety disorder, unspecified: Secondary | ICD-10-CM | POA: Diagnosis not present

## 2018-09-28 DIAGNOSIS — I10 Essential (primary) hypertension: Secondary | ICD-10-CM | POA: Diagnosis not present

## 2018-09-28 DIAGNOSIS — N952 Postmenopausal atrophic vaginitis: Secondary | ICD-10-CM | POA: Diagnosis not present

## 2018-09-28 DIAGNOSIS — I482 Chronic atrial fibrillation, unspecified: Secondary | ICD-10-CM | POA: Diagnosis not present

## 2018-09-30 DIAGNOSIS — Z79899 Other long term (current) drug therapy: Secondary | ICD-10-CM | POA: Diagnosis not present

## 2018-10-08 DIAGNOSIS — R1310 Dysphagia, unspecified: Secondary | ICD-10-CM | POA: Diagnosis not present

## 2018-10-08 DIAGNOSIS — R1033 Periumbilical pain: Secondary | ICD-10-CM | POA: Diagnosis not present

## 2018-10-08 DIAGNOSIS — R194 Change in bowel habit: Secondary | ICD-10-CM | POA: Diagnosis not present

## 2018-10-08 DIAGNOSIS — R634 Abnormal weight loss: Secondary | ICD-10-CM | POA: Diagnosis not present

## 2018-10-11 ENCOUNTER — Other Ambulatory Visit: Payer: Self-pay | Admitting: Gastroenterology

## 2018-10-11 DIAGNOSIS — R1310 Dysphagia, unspecified: Secondary | ICD-10-CM

## 2018-11-18 DIAGNOSIS — F419 Anxiety disorder, unspecified: Secondary | ICD-10-CM | POA: Diagnosis not present

## 2018-11-18 DIAGNOSIS — I10 Essential (primary) hypertension: Secondary | ICD-10-CM | POA: Diagnosis not present

## 2018-11-18 DIAGNOSIS — K5901 Slow transit constipation: Secondary | ICD-10-CM | POA: Diagnosis not present

## 2018-11-18 DIAGNOSIS — R141 Gas pain: Secondary | ICD-10-CM | POA: Diagnosis not present

## 2018-11-18 DIAGNOSIS — K219 Gastro-esophageal reflux disease without esophagitis: Secondary | ICD-10-CM | POA: Diagnosis not present

## 2018-11-18 DIAGNOSIS — G894 Chronic pain syndrome: Secondary | ICD-10-CM | POA: Diagnosis not present

## 2018-11-24 DIAGNOSIS — G629 Polyneuropathy, unspecified: Secondary | ICD-10-CM | POA: Diagnosis not present

## 2018-11-24 DIAGNOSIS — G894 Chronic pain syndrome: Secondary | ICD-10-CM | POA: Diagnosis not present

## 2018-11-24 DIAGNOSIS — M159 Polyosteoarthritis, unspecified: Secondary | ICD-10-CM | POA: Diagnosis not present

## 2018-11-25 DIAGNOSIS — R1310 Dysphagia, unspecified: Secondary | ICD-10-CM | POA: Diagnosis not present

## 2018-11-25 DIAGNOSIS — K219 Gastro-esophageal reflux disease without esophagitis: Secondary | ICD-10-CM | POA: Diagnosis not present

## 2018-11-25 DIAGNOSIS — I10 Essential (primary) hypertension: Secondary | ICD-10-CM | POA: Diagnosis not present

## 2018-11-25 DIAGNOSIS — G894 Chronic pain syndrome: Secondary | ICD-10-CM | POA: Diagnosis not present

## 2018-11-25 DIAGNOSIS — R141 Gas pain: Secondary | ICD-10-CM | POA: Diagnosis not present

## 2018-11-25 DIAGNOSIS — K5901 Slow transit constipation: Secondary | ICD-10-CM | POA: Diagnosis not present

## 2018-11-27 DIAGNOSIS — F329 Major depressive disorder, single episode, unspecified: Secondary | ICD-10-CM | POA: Diagnosis not present

## 2018-11-27 DIAGNOSIS — Z79891 Long term (current) use of opiate analgesic: Secondary | ICD-10-CM | POA: Diagnosis not present

## 2018-11-27 DIAGNOSIS — G894 Chronic pain syndrome: Secondary | ICD-10-CM | POA: Diagnosis not present

## 2018-11-27 DIAGNOSIS — Z9181 History of falling: Secondary | ICD-10-CM | POA: Diagnosis not present

## 2018-11-27 DIAGNOSIS — M159 Polyosteoarthritis, unspecified: Secondary | ICD-10-CM | POA: Diagnosis not present

## 2018-11-27 DIAGNOSIS — G629 Polyneuropathy, unspecified: Secondary | ICD-10-CM | POA: Diagnosis not present

## 2018-11-27 DIAGNOSIS — I4891 Unspecified atrial fibrillation: Secondary | ICD-10-CM | POA: Diagnosis not present

## 2018-11-27 DIAGNOSIS — Z6841 Body Mass Index (BMI) 40.0 and over, adult: Secondary | ICD-10-CM | POA: Diagnosis not present

## 2018-11-27 DIAGNOSIS — R1314 Dysphagia, pharyngoesophageal phase: Secondary | ICD-10-CM | POA: Diagnosis not present

## 2018-11-27 DIAGNOSIS — I2510 Atherosclerotic heart disease of native coronary artery without angina pectoris: Secondary | ICD-10-CM | POA: Diagnosis not present

## 2018-11-27 DIAGNOSIS — I10 Essential (primary) hypertension: Secondary | ICD-10-CM | POA: Diagnosis not present

## 2018-11-29 DIAGNOSIS — G894 Chronic pain syndrome: Secondary | ICD-10-CM | POA: Diagnosis not present

## 2018-11-29 DIAGNOSIS — G629 Polyneuropathy, unspecified: Secondary | ICD-10-CM | POA: Diagnosis not present

## 2018-11-29 DIAGNOSIS — M159 Polyosteoarthritis, unspecified: Secondary | ICD-10-CM | POA: Diagnosis not present

## 2018-11-30 DIAGNOSIS — G629 Polyneuropathy, unspecified: Secondary | ICD-10-CM | POA: Diagnosis not present

## 2018-11-30 DIAGNOSIS — I10 Essential (primary) hypertension: Secondary | ICD-10-CM | POA: Diagnosis not present

## 2018-11-30 DIAGNOSIS — G894 Chronic pain syndrome: Secondary | ICD-10-CM | POA: Diagnosis not present

## 2018-11-30 DIAGNOSIS — R1314 Dysphagia, pharyngoesophageal phase: Secondary | ICD-10-CM | POA: Diagnosis not present

## 2018-11-30 DIAGNOSIS — M159 Polyosteoarthritis, unspecified: Secondary | ICD-10-CM | POA: Diagnosis not present

## 2018-11-30 DIAGNOSIS — I2510 Atherosclerotic heart disease of native coronary artery without angina pectoris: Secondary | ICD-10-CM | POA: Diagnosis not present

## 2018-12-01 DIAGNOSIS — M159 Polyosteoarthritis, unspecified: Secondary | ICD-10-CM | POA: Diagnosis not present

## 2018-12-01 DIAGNOSIS — G894 Chronic pain syndrome: Secondary | ICD-10-CM | POA: Diagnosis not present

## 2018-12-01 DIAGNOSIS — R1314 Dysphagia, pharyngoesophageal phase: Secondary | ICD-10-CM | POA: Diagnosis not present

## 2018-12-01 DIAGNOSIS — I10 Essential (primary) hypertension: Secondary | ICD-10-CM | POA: Diagnosis not present

## 2018-12-01 DIAGNOSIS — I2510 Atherosclerotic heart disease of native coronary artery without angina pectoris: Secondary | ICD-10-CM | POA: Diagnosis not present

## 2018-12-01 DIAGNOSIS — G629 Polyneuropathy, unspecified: Secondary | ICD-10-CM | POA: Diagnosis not present

## 2018-12-03 DIAGNOSIS — I10 Essential (primary) hypertension: Secondary | ICD-10-CM | POA: Diagnosis not present

## 2018-12-03 DIAGNOSIS — G629 Polyneuropathy, unspecified: Secondary | ICD-10-CM | POA: Diagnosis not present

## 2018-12-03 DIAGNOSIS — G894 Chronic pain syndrome: Secondary | ICD-10-CM | POA: Diagnosis not present

## 2018-12-03 DIAGNOSIS — I2510 Atherosclerotic heart disease of native coronary artery without angina pectoris: Secondary | ICD-10-CM | POA: Diagnosis not present

## 2018-12-03 DIAGNOSIS — R1314 Dysphagia, pharyngoesophageal phase: Secondary | ICD-10-CM | POA: Diagnosis not present

## 2018-12-03 DIAGNOSIS — M159 Polyosteoarthritis, unspecified: Secondary | ICD-10-CM | POA: Diagnosis not present

## 2018-12-09 DIAGNOSIS — K5901 Slow transit constipation: Secondary | ICD-10-CM | POA: Diagnosis not present

## 2018-12-09 DIAGNOSIS — R1310 Dysphagia, unspecified: Secondary | ICD-10-CM | POA: Diagnosis not present

## 2018-12-09 DIAGNOSIS — G894 Chronic pain syndrome: Secondary | ICD-10-CM | POA: Diagnosis not present

## 2018-12-09 DIAGNOSIS — K219 Gastro-esophageal reflux disease without esophagitis: Secondary | ICD-10-CM | POA: Diagnosis not present

## 2018-12-09 DIAGNOSIS — I10 Essential (primary) hypertension: Secondary | ICD-10-CM | POA: Diagnosis not present

## 2018-12-09 DIAGNOSIS — R141 Gas pain: Secondary | ICD-10-CM | POA: Diagnosis not present

## 2018-12-23 DIAGNOSIS — R21 Rash and other nonspecific skin eruption: Secondary | ICD-10-CM | POA: Diagnosis not present

## 2018-12-23 DIAGNOSIS — I10 Essential (primary) hypertension: Secondary | ICD-10-CM | POA: Diagnosis not present

## 2018-12-23 DIAGNOSIS — R141 Gas pain: Secondary | ICD-10-CM | POA: Diagnosis not present

## 2018-12-23 DIAGNOSIS — R2681 Unsteadiness on feet: Secondary | ICD-10-CM | POA: Diagnosis not present

## 2018-12-23 DIAGNOSIS — R1310 Dysphagia, unspecified: Secondary | ICD-10-CM | POA: Diagnosis not present

## 2018-12-23 DIAGNOSIS — G894 Chronic pain syndrome: Secondary | ICD-10-CM | POA: Diagnosis not present

## 2018-12-27 DIAGNOSIS — Z79899 Other long term (current) drug therapy: Secondary | ICD-10-CM | POA: Diagnosis not present

## 2019-01-06 DIAGNOSIS — Z20828 Contact with and (suspected) exposure to other viral communicable diseases: Secondary | ICD-10-CM | POA: Diagnosis not present

## 2019-01-06 DIAGNOSIS — G894 Chronic pain syndrome: Secondary | ICD-10-CM | POA: Diagnosis not present

## 2019-01-06 DIAGNOSIS — R21 Rash and other nonspecific skin eruption: Secondary | ICD-10-CM | POA: Diagnosis not present

## 2019-01-06 DIAGNOSIS — I10 Essential (primary) hypertension: Secondary | ICD-10-CM | POA: Diagnosis not present

## 2019-01-06 DIAGNOSIS — R2681 Unsteadiness on feet: Secondary | ICD-10-CM | POA: Diagnosis not present

## 2019-01-19 DIAGNOSIS — Z79899 Other long term (current) drug therapy: Secondary | ICD-10-CM | POA: Diagnosis not present

## 2019-01-19 DIAGNOSIS — I10 Essential (primary) hypertension: Secondary | ICD-10-CM | POA: Diagnosis not present

## 2019-01-20 DIAGNOSIS — R2681 Unsteadiness on feet: Secondary | ICD-10-CM | POA: Diagnosis not present

## 2019-01-20 DIAGNOSIS — W57XXXA Bitten or stung by nonvenomous insect and other nonvenomous arthropods, initial encounter: Secondary | ICD-10-CM | POA: Diagnosis not present

## 2019-01-20 DIAGNOSIS — I10 Essential (primary) hypertension: Secondary | ICD-10-CM | POA: Diagnosis not present

## 2019-01-20 DIAGNOSIS — G894 Chronic pain syndrome: Secondary | ICD-10-CM | POA: Diagnosis not present

## 2019-01-20 DIAGNOSIS — R21 Rash and other nonspecific skin eruption: Secondary | ICD-10-CM | POA: Diagnosis not present

## 2019-02-03 DIAGNOSIS — R2681 Unsteadiness on feet: Secondary | ICD-10-CM | POA: Diagnosis not present

## 2019-02-03 DIAGNOSIS — N390 Urinary tract infection, site not specified: Secondary | ICD-10-CM | POA: Diagnosis not present

## 2019-02-03 DIAGNOSIS — E039 Hypothyroidism, unspecified: Secondary | ICD-10-CM | POA: Diagnosis not present

## 2019-02-03 DIAGNOSIS — R300 Dysuria: Secondary | ICD-10-CM | POA: Diagnosis not present

## 2019-02-03 DIAGNOSIS — Z79899 Other long term (current) drug therapy: Secondary | ICD-10-CM | POA: Diagnosis not present

## 2019-02-03 DIAGNOSIS — I10 Essential (primary) hypertension: Secondary | ICD-10-CM | POA: Diagnosis not present

## 2019-02-03 DIAGNOSIS — G894 Chronic pain syndrome: Secondary | ICD-10-CM | POA: Diagnosis not present

## 2019-02-24 DIAGNOSIS — E039 Hypothyroidism, unspecified: Secondary | ICD-10-CM | POA: Diagnosis not present

## 2019-02-24 DIAGNOSIS — I10 Essential (primary) hypertension: Secondary | ICD-10-CM | POA: Diagnosis not present

## 2019-02-24 DIAGNOSIS — K5903 Drug induced constipation: Secondary | ICD-10-CM | POA: Diagnosis not present

## 2019-02-24 DIAGNOSIS — G894 Chronic pain syndrome: Secondary | ICD-10-CM | POA: Diagnosis not present

## 2019-02-28 ENCOUNTER — Other Ambulatory Visit: Payer: Self-pay

## 2019-03-03 DIAGNOSIS — R269 Unspecified abnormalities of gait and mobility: Secondary | ICD-10-CM | POA: Diagnosis not present

## 2019-03-03 DIAGNOSIS — K5903 Drug induced constipation: Secondary | ICD-10-CM | POA: Diagnosis not present

## 2019-03-03 DIAGNOSIS — I10 Essential (primary) hypertension: Secondary | ICD-10-CM | POA: Diagnosis not present

## 2019-03-10 DIAGNOSIS — M25561 Pain in right knee: Secondary | ICD-10-CM | POA: Diagnosis not present

## 2019-03-10 DIAGNOSIS — R269 Unspecified abnormalities of gait and mobility: Secondary | ICD-10-CM | POA: Diagnosis not present

## 2019-03-10 DIAGNOSIS — I10 Essential (primary) hypertension: Secondary | ICD-10-CM | POA: Diagnosis not present

## 2019-03-10 DIAGNOSIS — M25562 Pain in left knee: Secondary | ICD-10-CM | POA: Diagnosis not present

## 2019-03-10 DIAGNOSIS — G894 Chronic pain syndrome: Secondary | ICD-10-CM | POA: Diagnosis not present

## 2019-03-23 DIAGNOSIS — M25512 Pain in left shoulder: Secondary | ICD-10-CM | POA: Diagnosis not present

## 2019-03-23 DIAGNOSIS — R0781 Pleurodynia: Secondary | ICD-10-CM | POA: Diagnosis not present

## 2019-03-23 DIAGNOSIS — W19XXXA Unspecified fall, initial encounter: Secondary | ICD-10-CM | POA: Diagnosis not present

## 2019-03-23 DIAGNOSIS — M19012 Primary osteoarthritis, left shoulder: Secondary | ICD-10-CM | POA: Diagnosis not present

## 2019-03-24 DIAGNOSIS — M25562 Pain in left knee: Secondary | ICD-10-CM | POA: Diagnosis not present

## 2019-03-24 DIAGNOSIS — M159 Polyosteoarthritis, unspecified: Secondary | ICD-10-CM | POA: Diagnosis not present

## 2019-03-24 DIAGNOSIS — G894 Chronic pain syndrome: Secondary | ICD-10-CM | POA: Diagnosis not present

## 2019-03-24 DIAGNOSIS — M25561 Pain in right knee: Secondary | ICD-10-CM | POA: Diagnosis not present

## 2019-03-24 DIAGNOSIS — I10 Essential (primary) hypertension: Secondary | ICD-10-CM | POA: Diagnosis not present

## 2019-03-24 DIAGNOSIS — R269 Unspecified abnormalities of gait and mobility: Secondary | ICD-10-CM | POA: Diagnosis not present

## 2019-03-29 DIAGNOSIS — Z79891 Long term (current) use of opiate analgesic: Secondary | ICD-10-CM | POA: Diagnosis not present

## 2019-03-29 DIAGNOSIS — M25562 Pain in left knee: Secondary | ICD-10-CM | POA: Diagnosis not present

## 2019-03-29 DIAGNOSIS — I4891 Unspecified atrial fibrillation: Secondary | ICD-10-CM | POA: Diagnosis not present

## 2019-03-29 DIAGNOSIS — I10 Essential (primary) hypertension: Secondary | ICD-10-CM | POA: Diagnosis not present

## 2019-03-29 DIAGNOSIS — I2510 Atherosclerotic heart disease of native coronary artery without angina pectoris: Secondary | ICD-10-CM | POA: Diagnosis not present

## 2019-03-29 DIAGNOSIS — M546 Pain in thoracic spine: Secondary | ICD-10-CM | POA: Diagnosis not present

## 2019-03-29 DIAGNOSIS — F419 Anxiety disorder, unspecified: Secondary | ICD-10-CM | POA: Diagnosis not present

## 2019-03-29 DIAGNOSIS — Z9181 History of falling: Secondary | ICD-10-CM | POA: Diagnosis not present

## 2019-03-29 DIAGNOSIS — M159 Polyosteoarthritis, unspecified: Secondary | ICD-10-CM | POA: Diagnosis not present

## 2019-03-29 DIAGNOSIS — F329 Major depressive disorder, single episode, unspecified: Secondary | ICD-10-CM | POA: Diagnosis not present

## 2019-03-29 DIAGNOSIS — W19XXXS Unspecified fall, sequela: Secondary | ICD-10-CM | POA: Diagnosis not present

## 2019-03-29 DIAGNOSIS — Z96653 Presence of artificial knee joint, bilateral: Secondary | ICD-10-CM | POA: Diagnosis not present

## 2019-03-29 DIAGNOSIS — M25561 Pain in right knee: Secondary | ICD-10-CM | POA: Diagnosis not present

## 2019-03-29 DIAGNOSIS — G894 Chronic pain syndrome: Secondary | ICD-10-CM | POA: Diagnosis not present

## 2019-03-29 DIAGNOSIS — M25512 Pain in left shoulder: Secondary | ICD-10-CM | POA: Diagnosis not present

## 2019-04-01 DIAGNOSIS — M25561 Pain in right knee: Secondary | ICD-10-CM | POA: Diagnosis not present

## 2019-04-01 DIAGNOSIS — M25512 Pain in left shoulder: Secondary | ICD-10-CM | POA: Diagnosis not present

## 2019-04-01 DIAGNOSIS — M25562 Pain in left knee: Secondary | ICD-10-CM | POA: Diagnosis not present

## 2019-04-01 DIAGNOSIS — G894 Chronic pain syndrome: Secondary | ICD-10-CM | POA: Diagnosis not present

## 2019-04-01 DIAGNOSIS — M159 Polyosteoarthritis, unspecified: Secondary | ICD-10-CM | POA: Diagnosis not present

## 2019-04-01 DIAGNOSIS — M546 Pain in thoracic spine: Secondary | ICD-10-CM | POA: Diagnosis not present

## 2019-04-05 DIAGNOSIS — M546 Pain in thoracic spine: Secondary | ICD-10-CM | POA: Diagnosis not present

## 2019-04-05 DIAGNOSIS — M25561 Pain in right knee: Secondary | ICD-10-CM | POA: Diagnosis not present

## 2019-04-05 DIAGNOSIS — M25562 Pain in left knee: Secondary | ICD-10-CM | POA: Diagnosis not present

## 2019-04-05 DIAGNOSIS — M25512 Pain in left shoulder: Secondary | ICD-10-CM | POA: Diagnosis not present

## 2019-04-05 DIAGNOSIS — M159 Polyosteoarthritis, unspecified: Secondary | ICD-10-CM | POA: Diagnosis not present

## 2019-04-05 DIAGNOSIS — G894 Chronic pain syndrome: Secondary | ICD-10-CM | POA: Diagnosis not present

## 2019-04-07 DIAGNOSIS — G894 Chronic pain syndrome: Secondary | ICD-10-CM | POA: Diagnosis not present

## 2019-04-07 DIAGNOSIS — M159 Polyosteoarthritis, unspecified: Secondary | ICD-10-CM | POA: Diagnosis not present

## 2019-04-07 DIAGNOSIS — M25562 Pain in left knee: Secondary | ICD-10-CM | POA: Diagnosis not present

## 2019-04-07 DIAGNOSIS — M546 Pain in thoracic spine: Secondary | ICD-10-CM | POA: Diagnosis not present

## 2019-04-07 DIAGNOSIS — M25512 Pain in left shoulder: Secondary | ICD-10-CM | POA: Diagnosis not present

## 2019-04-07 DIAGNOSIS — M25561 Pain in right knee: Secondary | ICD-10-CM | POA: Diagnosis not present

## 2019-04-11 DIAGNOSIS — M25512 Pain in left shoulder: Secondary | ICD-10-CM | POA: Diagnosis not present

## 2019-04-11 DIAGNOSIS — M25561 Pain in right knee: Secondary | ICD-10-CM | POA: Diagnosis not present

## 2019-04-11 DIAGNOSIS — M546 Pain in thoracic spine: Secondary | ICD-10-CM | POA: Diagnosis not present

## 2019-04-11 DIAGNOSIS — M159 Polyosteoarthritis, unspecified: Secondary | ICD-10-CM | POA: Diagnosis not present

## 2019-04-11 DIAGNOSIS — M25562 Pain in left knee: Secondary | ICD-10-CM | POA: Diagnosis not present

## 2019-04-11 DIAGNOSIS — G894 Chronic pain syndrome: Secondary | ICD-10-CM | POA: Diagnosis not present

## 2019-04-13 DIAGNOSIS — M159 Polyosteoarthritis, unspecified: Secondary | ICD-10-CM | POA: Diagnosis not present

## 2019-04-13 DIAGNOSIS — G894 Chronic pain syndrome: Secondary | ICD-10-CM | POA: Diagnosis not present

## 2019-04-13 DIAGNOSIS — M546 Pain in thoracic spine: Secondary | ICD-10-CM | POA: Diagnosis not present

## 2019-04-13 DIAGNOSIS — M25562 Pain in left knee: Secondary | ICD-10-CM | POA: Diagnosis not present

## 2019-04-13 DIAGNOSIS — M25561 Pain in right knee: Secondary | ICD-10-CM | POA: Diagnosis not present

## 2019-04-13 DIAGNOSIS — M25512 Pain in left shoulder: Secondary | ICD-10-CM | POA: Diagnosis not present

## 2019-04-14 DIAGNOSIS — M159 Polyosteoarthritis, unspecified: Secondary | ICD-10-CM | POA: Diagnosis not present

## 2019-04-14 DIAGNOSIS — R1084 Generalized abdominal pain: Secondary | ICD-10-CM | POA: Diagnosis not present

## 2019-04-14 DIAGNOSIS — R269 Unspecified abnormalities of gait and mobility: Secondary | ICD-10-CM | POA: Diagnosis not present

## 2019-04-14 DIAGNOSIS — I10 Essential (primary) hypertension: Secondary | ICD-10-CM | POA: Diagnosis not present

## 2019-04-14 DIAGNOSIS — G894 Chronic pain syndrome: Secondary | ICD-10-CM | POA: Diagnosis not present

## 2019-04-14 DIAGNOSIS — W19XXXA Unspecified fall, initial encounter: Secondary | ICD-10-CM | POA: Diagnosis not present

## 2019-04-18 DIAGNOSIS — M25562 Pain in left knee: Secondary | ICD-10-CM | POA: Diagnosis not present

## 2019-04-18 DIAGNOSIS — M25512 Pain in left shoulder: Secondary | ICD-10-CM | POA: Diagnosis not present

## 2019-04-18 DIAGNOSIS — G894 Chronic pain syndrome: Secondary | ICD-10-CM | POA: Diagnosis not present

## 2019-04-18 DIAGNOSIS — M546 Pain in thoracic spine: Secondary | ICD-10-CM | POA: Diagnosis not present

## 2019-04-18 DIAGNOSIS — M159 Polyosteoarthritis, unspecified: Secondary | ICD-10-CM | POA: Diagnosis not present

## 2019-04-18 DIAGNOSIS — M25561 Pain in right knee: Secondary | ICD-10-CM | POA: Diagnosis not present

## 2019-04-19 NOTE — Telephone Encounter (Signed)
Naomi from [facility] returned my call, she stated that they do not have any other records for this pt just what they sent with the referral. A message will be sent to Dr. Tarri Glenn.

## 2019-04-19 NOTE — Telephone Encounter (Signed)
Hi Dr. Tarri Glenn, we received this referral back in January from [facility]. Due to Covid-19 the nursing home postponed all their residents doctor's appointments. You had intimally asked for additional records. Naomi from the nursing home called today stating that they do not have any other records for this pt just what they initially sent. I am going to send you those records again for your review. Dr.Henry Tripp is the ordering physician; he is from [facility] calls. His phone # [phone] and fax# [phone]. Pt is being referred for dysphagia. Thank you.

## 2019-04-19 NOTE — Telephone Encounter (Signed)
We received a fax this morning from [facility] requesting appt for this pt. Per this notes, we are still pending to received ov and consult notes for review. I called [facility]'s number, got a message to call a different number [phone] and ask for [facility]. I called that number and left a message requesting records. Faxed received from [facility] is in the referral folder and original referral from January is in the file cabinet.

## 2019-04-20 NOTE — Telephone Encounter (Signed)
Hi Dr. Tarri Glenn, they should be given to you today. The nursing home does not have records, all they have is very few notes about why this patient is being referred. I apologize if my previous message was confusing, I got the impression that there were some records but when they sent just few notes, I called and I was told that that is all they have.

## 2019-04-20 NOTE — Telephone Encounter (Signed)
Thank you. I do not have any records at all.

## 2019-04-20 NOTE — Telephone Encounter (Signed)
I have not yet received the records. Thank you.

## 2019-04-21 DIAGNOSIS — I10 Essential (primary) hypertension: Secondary | ICD-10-CM | POA: Diagnosis not present

## 2019-04-21 DIAGNOSIS — M25512 Pain in left shoulder: Secondary | ICD-10-CM | POA: Diagnosis not present

## 2019-04-21 DIAGNOSIS — B3789 Other sites of candidiasis: Secondary | ICD-10-CM | POA: Diagnosis not present

## 2019-04-21 DIAGNOSIS — M25562 Pain in left knee: Secondary | ICD-10-CM | POA: Diagnosis not present

## 2019-04-21 DIAGNOSIS — M159 Polyosteoarthritis, unspecified: Secondary | ICD-10-CM | POA: Diagnosis not present

## 2019-04-21 DIAGNOSIS — G894 Chronic pain syndrome: Secondary | ICD-10-CM | POA: Diagnosis not present

## 2019-04-21 DIAGNOSIS — M546 Pain in thoracic spine: Secondary | ICD-10-CM | POA: Diagnosis not present

## 2019-04-21 DIAGNOSIS — M25561 Pain in right knee: Secondary | ICD-10-CM | POA: Diagnosis not present

## 2019-04-21 DIAGNOSIS — R1310 Dysphagia, unspecified: Secondary | ICD-10-CM | POA: Diagnosis not present

## 2019-04-22 ENCOUNTER — Encounter: Payer: Self-pay | Admitting: Gastroenterology

## 2019-04-22 NOTE — Telephone Encounter (Signed)
Left message to call back to schedule consult.  

## 2019-04-23 DIAGNOSIS — B3789 Other sites of candidiasis: Secondary | ICD-10-CM | POA: Diagnosis not present

## 2019-04-25 DIAGNOSIS — M25562 Pain in left knee: Secondary | ICD-10-CM | POA: Diagnosis not present

## 2019-04-25 DIAGNOSIS — M159 Polyosteoarthritis, unspecified: Secondary | ICD-10-CM | POA: Diagnosis not present

## 2019-04-25 DIAGNOSIS — M25561 Pain in right knee: Secondary | ICD-10-CM | POA: Diagnosis not present

## 2019-04-25 DIAGNOSIS — G894 Chronic pain syndrome: Secondary | ICD-10-CM | POA: Diagnosis not present

## 2019-04-25 DIAGNOSIS — M25512 Pain in left shoulder: Secondary | ICD-10-CM | POA: Diagnosis not present

## 2019-04-25 DIAGNOSIS — M546 Pain in thoracic spine: Secondary | ICD-10-CM | POA: Diagnosis not present

## 2019-04-27 DIAGNOSIS — M546 Pain in thoracic spine: Secondary | ICD-10-CM | POA: Diagnosis not present

## 2019-04-27 DIAGNOSIS — M159 Polyosteoarthritis, unspecified: Secondary | ICD-10-CM | POA: Diagnosis not present

## 2019-04-27 DIAGNOSIS — G894 Chronic pain syndrome: Secondary | ICD-10-CM | POA: Diagnosis not present

## 2019-04-27 DIAGNOSIS — M25561 Pain in right knee: Secondary | ICD-10-CM | POA: Diagnosis not present

## 2019-04-27 DIAGNOSIS — M25562 Pain in left knee: Secondary | ICD-10-CM | POA: Diagnosis not present

## 2019-04-27 DIAGNOSIS — M25512 Pain in left shoulder: Secondary | ICD-10-CM | POA: Diagnosis not present

## 2019-04-28 DIAGNOSIS — W19XXXS Unspecified fall, sequela: Secondary | ICD-10-CM | POA: Diagnosis not present

## 2019-04-28 DIAGNOSIS — I2510 Atherosclerotic heart disease of native coronary artery without angina pectoris: Secondary | ICD-10-CM | POA: Diagnosis not present

## 2019-04-28 DIAGNOSIS — F419 Anxiety disorder, unspecified: Secondary | ICD-10-CM | POA: Diagnosis not present

## 2019-04-28 DIAGNOSIS — M159 Polyosteoarthritis, unspecified: Secondary | ICD-10-CM | POA: Diagnosis not present

## 2019-04-28 DIAGNOSIS — F329 Major depressive disorder, single episode, unspecified: Secondary | ICD-10-CM | POA: Diagnosis not present

## 2019-04-28 DIAGNOSIS — M25561 Pain in right knee: Secondary | ICD-10-CM | POA: Diagnosis not present

## 2019-04-28 DIAGNOSIS — G894 Chronic pain syndrome: Secondary | ICD-10-CM | POA: Diagnosis not present

## 2019-04-28 DIAGNOSIS — M546 Pain in thoracic spine: Secondary | ICD-10-CM | POA: Diagnosis not present

## 2019-04-28 DIAGNOSIS — Z79891 Long term (current) use of opiate analgesic: Secondary | ICD-10-CM | POA: Diagnosis not present

## 2019-04-28 DIAGNOSIS — Z96653 Presence of artificial knee joint, bilateral: Secondary | ICD-10-CM | POA: Diagnosis not present

## 2019-04-28 DIAGNOSIS — M25512 Pain in left shoulder: Secondary | ICD-10-CM | POA: Diagnosis not present

## 2019-04-28 DIAGNOSIS — Z9181 History of falling: Secondary | ICD-10-CM | POA: Diagnosis not present

## 2019-04-28 DIAGNOSIS — I10 Essential (primary) hypertension: Secondary | ICD-10-CM | POA: Diagnosis not present

## 2019-04-28 DIAGNOSIS — M25562 Pain in left knee: Secondary | ICD-10-CM | POA: Diagnosis not present

## 2019-04-28 DIAGNOSIS — I4891 Unspecified atrial fibrillation: Secondary | ICD-10-CM | POA: Diagnosis not present

## 2019-05-02 DIAGNOSIS — M546 Pain in thoracic spine: Secondary | ICD-10-CM | POA: Diagnosis not present

## 2019-05-02 DIAGNOSIS — M25512 Pain in left shoulder: Secondary | ICD-10-CM | POA: Diagnosis not present

## 2019-05-02 DIAGNOSIS — G894 Chronic pain syndrome: Secondary | ICD-10-CM | POA: Diagnosis not present

## 2019-05-02 DIAGNOSIS — M25562 Pain in left knee: Secondary | ICD-10-CM | POA: Diagnosis not present

## 2019-05-02 DIAGNOSIS — M159 Polyosteoarthritis, unspecified: Secondary | ICD-10-CM | POA: Diagnosis not present

## 2019-05-02 DIAGNOSIS — M25561 Pain in right knee: Secondary | ICD-10-CM | POA: Diagnosis not present

## 2019-05-03 DIAGNOSIS — R1310 Dysphagia, unspecified: Secondary | ICD-10-CM | POA: Diagnosis not present

## 2019-05-03 DIAGNOSIS — K5900 Constipation, unspecified: Secondary | ICD-10-CM | POA: Diagnosis not present

## 2019-05-04 DIAGNOSIS — M25561 Pain in right knee: Secondary | ICD-10-CM | POA: Diagnosis not present

## 2019-05-04 DIAGNOSIS — M159 Polyosteoarthritis, unspecified: Secondary | ICD-10-CM | POA: Diagnosis not present

## 2019-05-04 DIAGNOSIS — M25562 Pain in left knee: Secondary | ICD-10-CM | POA: Diagnosis not present

## 2019-05-04 DIAGNOSIS — G894 Chronic pain syndrome: Secondary | ICD-10-CM | POA: Diagnosis not present

## 2019-05-04 DIAGNOSIS — M25512 Pain in left shoulder: Secondary | ICD-10-CM | POA: Diagnosis not present

## 2019-05-04 DIAGNOSIS — M546 Pain in thoracic spine: Secondary | ICD-10-CM | POA: Diagnosis not present

## 2019-05-09 DIAGNOSIS — M25512 Pain in left shoulder: Secondary | ICD-10-CM | POA: Diagnosis not present

## 2019-05-09 DIAGNOSIS — M25561 Pain in right knee: Secondary | ICD-10-CM | POA: Diagnosis not present

## 2019-05-09 DIAGNOSIS — G894 Chronic pain syndrome: Secondary | ICD-10-CM | POA: Diagnosis not present

## 2019-05-09 DIAGNOSIS — M25562 Pain in left knee: Secondary | ICD-10-CM | POA: Diagnosis not present

## 2019-05-09 DIAGNOSIS — M159 Polyosteoarthritis, unspecified: Secondary | ICD-10-CM | POA: Diagnosis not present

## 2019-05-09 DIAGNOSIS — M546 Pain in thoracic spine: Secondary | ICD-10-CM | POA: Diagnosis not present

## 2019-05-11 ENCOUNTER — Ambulatory Visit
Admission: RE | Admit: 2019-05-11 | Discharge: 2019-05-11 | Disposition: A | Discharge status: Home / Self Care | Payer: Medicare Other | Source: Ambulatory Visit | Attending: Gastroenterology | Admitting: Gastroenterology

## 2019-05-11 ENCOUNTER — Other Ambulatory Visit: Payer: Self-pay | Admitting: Gastroenterology

## 2019-05-11 DIAGNOSIS — R1310 Dysphagia, unspecified: Secondary | ICD-10-CM

## 2019-05-11 DIAGNOSIS — T18128A Food in esophagus causing other injury, initial encounter: Secondary | ICD-10-CM | POA: Diagnosis not present

## 2019-05-12 DIAGNOSIS — I10 Essential (primary) hypertension: Secondary | ICD-10-CM | POA: Diagnosis not present

## 2019-05-12 DIAGNOSIS — R1310 Dysphagia, unspecified: Secondary | ICD-10-CM | POA: Diagnosis not present

## 2019-05-12 DIAGNOSIS — R399 Unspecified symptoms and signs involving the genitourinary system: Secondary | ICD-10-CM | POA: Diagnosis not present

## 2019-05-12 DIAGNOSIS — G894 Chronic pain syndrome: Secondary | ICD-10-CM | POA: Diagnosis not present

## 2019-05-12 DIAGNOSIS — M159 Polyosteoarthritis, unspecified: Secondary | ICD-10-CM | POA: Diagnosis not present

## 2019-05-13 DIAGNOSIS — M159 Polyosteoarthritis, unspecified: Secondary | ICD-10-CM | POA: Diagnosis not present

## 2019-05-13 DIAGNOSIS — G894 Chronic pain syndrome: Secondary | ICD-10-CM | POA: Diagnosis not present

## 2019-05-13 DIAGNOSIS — M25562 Pain in left knee: Secondary | ICD-10-CM | POA: Diagnosis not present

## 2019-05-13 DIAGNOSIS — M25512 Pain in left shoulder: Secondary | ICD-10-CM | POA: Diagnosis not present

## 2019-05-13 DIAGNOSIS — M546 Pain in thoracic spine: Secondary | ICD-10-CM | POA: Diagnosis not present

## 2019-05-13 DIAGNOSIS — M25561 Pain in right knee: Secondary | ICD-10-CM | POA: Diagnosis not present

## 2019-05-16 DIAGNOSIS — M159 Polyosteoarthritis, unspecified: Secondary | ICD-10-CM | POA: Diagnosis not present

## 2019-05-16 DIAGNOSIS — M25562 Pain in left knee: Secondary | ICD-10-CM | POA: Diagnosis not present

## 2019-05-16 DIAGNOSIS — M25561 Pain in right knee: Secondary | ICD-10-CM | POA: Diagnosis not present

## 2019-05-16 DIAGNOSIS — M25512 Pain in left shoulder: Secondary | ICD-10-CM | POA: Diagnosis not present

## 2019-05-16 DIAGNOSIS — G894 Chronic pain syndrome: Secondary | ICD-10-CM | POA: Diagnosis not present

## 2019-05-16 DIAGNOSIS — M546 Pain in thoracic spine: Secondary | ICD-10-CM | POA: Diagnosis not present

## 2019-05-19 DIAGNOSIS — G894 Chronic pain syndrome: Secondary | ICD-10-CM | POA: Diagnosis not present

## 2019-05-19 DIAGNOSIS — I10 Essential (primary) hypertension: Secondary | ICD-10-CM | POA: Diagnosis not present

## 2019-05-19 DIAGNOSIS — M25512 Pain in left shoulder: Secondary | ICD-10-CM | POA: Diagnosis not present

## 2019-05-19 DIAGNOSIS — M25561 Pain in right knee: Secondary | ICD-10-CM | POA: Diagnosis not present

## 2019-05-19 DIAGNOSIS — M25562 Pain in left knee: Secondary | ICD-10-CM | POA: Diagnosis not present

## 2019-05-19 DIAGNOSIS — M546 Pain in thoracic spine: Secondary | ICD-10-CM | POA: Diagnosis not present

## 2019-05-19 DIAGNOSIS — W19XXXA Unspecified fall, initial encounter: Secondary | ICD-10-CM | POA: Diagnosis not present

## 2019-05-19 DIAGNOSIS — M159 Polyosteoarthritis, unspecified: Secondary | ICD-10-CM | POA: Diagnosis not present

## 2019-05-23 DIAGNOSIS — G894 Chronic pain syndrome: Secondary | ICD-10-CM | POA: Diagnosis not present

## 2019-05-23 DIAGNOSIS — M25512 Pain in left shoulder: Secondary | ICD-10-CM | POA: Diagnosis not present

## 2019-05-23 DIAGNOSIS — M25562 Pain in left knee: Secondary | ICD-10-CM | POA: Diagnosis not present

## 2019-05-23 DIAGNOSIS — M159 Polyosteoarthritis, unspecified: Secondary | ICD-10-CM | POA: Diagnosis not present

## 2019-05-23 DIAGNOSIS — M25561 Pain in right knee: Secondary | ICD-10-CM | POA: Diagnosis not present

## 2019-05-23 DIAGNOSIS — M546 Pain in thoracic spine: Secondary | ICD-10-CM | POA: Diagnosis not present

## 2019-05-25 DIAGNOSIS — M546 Pain in thoracic spine: Secondary | ICD-10-CM | POA: Diagnosis not present

## 2019-05-25 DIAGNOSIS — M25562 Pain in left knee: Secondary | ICD-10-CM | POA: Diagnosis not present

## 2019-05-25 DIAGNOSIS — G894 Chronic pain syndrome: Secondary | ICD-10-CM | POA: Diagnosis not present

## 2019-05-25 DIAGNOSIS — M25512 Pain in left shoulder: Secondary | ICD-10-CM | POA: Diagnosis not present

## 2019-05-25 DIAGNOSIS — M25561 Pain in right knee: Secondary | ICD-10-CM | POA: Diagnosis not present

## 2019-05-25 DIAGNOSIS — M159 Polyosteoarthritis, unspecified: Secondary | ICD-10-CM | POA: Diagnosis not present

## 2019-05-26 ENCOUNTER — Ambulatory Visit: Payer: Medicare Other | Admitting: Gastroenterology

## 2019-05-26 DIAGNOSIS — F331 Major depressive disorder, recurrent, moderate: Secondary | ICD-10-CM | POA: Diagnosis not present

## 2019-05-26 DIAGNOSIS — I10 Essential (primary) hypertension: Secondary | ICD-10-CM | POA: Diagnosis not present

## 2019-05-26 DIAGNOSIS — G894 Chronic pain syndrome: Secondary | ICD-10-CM | POA: Diagnosis not present

## 2019-05-26 DIAGNOSIS — M159 Polyosteoarthritis, unspecified: Secondary | ICD-10-CM | POA: Diagnosis not present

## 2019-05-28 DIAGNOSIS — W19XXXS Unspecified fall, sequela: Secondary | ICD-10-CM | POA: Diagnosis not present

## 2019-05-28 DIAGNOSIS — M25561 Pain in right knee: Secondary | ICD-10-CM | POA: Diagnosis not present

## 2019-05-28 DIAGNOSIS — I10 Essential (primary) hypertension: Secondary | ICD-10-CM | POA: Diagnosis not present

## 2019-05-28 DIAGNOSIS — F329 Major depressive disorder, single episode, unspecified: Secondary | ICD-10-CM | POA: Diagnosis not present

## 2019-05-28 DIAGNOSIS — I2510 Atherosclerotic heart disease of native coronary artery without angina pectoris: Secondary | ICD-10-CM | POA: Diagnosis not present

## 2019-05-28 DIAGNOSIS — Z96653 Presence of artificial knee joint, bilateral: Secondary | ICD-10-CM | POA: Diagnosis not present

## 2019-05-28 DIAGNOSIS — G894 Chronic pain syndrome: Secondary | ICD-10-CM | POA: Diagnosis not present

## 2019-05-28 DIAGNOSIS — M159 Polyosteoarthritis, unspecified: Secondary | ICD-10-CM | POA: Diagnosis not present

## 2019-05-28 DIAGNOSIS — Z79891 Long term (current) use of opiate analgesic: Secondary | ICD-10-CM | POA: Diagnosis not present

## 2019-05-28 DIAGNOSIS — I4891 Unspecified atrial fibrillation: Secondary | ICD-10-CM | POA: Diagnosis not present

## 2019-05-28 DIAGNOSIS — M25512 Pain in left shoulder: Secondary | ICD-10-CM | POA: Diagnosis not present

## 2019-05-28 DIAGNOSIS — M25562 Pain in left knee: Secondary | ICD-10-CM | POA: Diagnosis not present

## 2019-05-28 DIAGNOSIS — F419 Anxiety disorder, unspecified: Secondary | ICD-10-CM | POA: Diagnosis not present

## 2019-05-28 DIAGNOSIS — M546 Pain in thoracic spine: Secondary | ICD-10-CM | POA: Diagnosis not present

## 2019-05-28 DIAGNOSIS — Z9181 History of falling: Secondary | ICD-10-CM | POA: Diagnosis not present

## 2019-05-30 DIAGNOSIS — M25512 Pain in left shoulder: Secondary | ICD-10-CM | POA: Diagnosis not present

## 2019-05-30 DIAGNOSIS — M25562 Pain in left knee: Secondary | ICD-10-CM | POA: Diagnosis not present

## 2019-05-30 DIAGNOSIS — M25561 Pain in right knee: Secondary | ICD-10-CM | POA: Diagnosis not present

## 2019-05-30 DIAGNOSIS — M159 Polyosteoarthritis, unspecified: Secondary | ICD-10-CM | POA: Diagnosis not present

## 2019-05-30 DIAGNOSIS — M546 Pain in thoracic spine: Secondary | ICD-10-CM | POA: Diagnosis not present

## 2019-05-30 DIAGNOSIS — G894 Chronic pain syndrome: Secondary | ICD-10-CM | POA: Diagnosis not present

## 2019-06-03 DIAGNOSIS — M25562 Pain in left knee: Secondary | ICD-10-CM | POA: Diagnosis not present

## 2019-06-03 DIAGNOSIS — M159 Polyosteoarthritis, unspecified: Secondary | ICD-10-CM | POA: Diagnosis not present

## 2019-06-03 DIAGNOSIS — M25561 Pain in right knee: Secondary | ICD-10-CM | POA: Diagnosis not present

## 2019-06-03 DIAGNOSIS — M25512 Pain in left shoulder: Secondary | ICD-10-CM | POA: Diagnosis not present

## 2019-06-03 DIAGNOSIS — G894 Chronic pain syndrome: Secondary | ICD-10-CM | POA: Diagnosis not present

## 2019-06-03 DIAGNOSIS — M546 Pain in thoracic spine: Secondary | ICD-10-CM | POA: Diagnosis not present

## 2019-06-06 DIAGNOSIS — M25561 Pain in right knee: Secondary | ICD-10-CM | POA: Diagnosis not present

## 2019-06-06 DIAGNOSIS — M546 Pain in thoracic spine: Secondary | ICD-10-CM | POA: Diagnosis not present

## 2019-06-06 DIAGNOSIS — G894 Chronic pain syndrome: Secondary | ICD-10-CM | POA: Diagnosis not present

## 2019-06-06 DIAGNOSIS — M159 Polyosteoarthritis, unspecified: Secondary | ICD-10-CM | POA: Diagnosis not present

## 2019-06-06 DIAGNOSIS — M25512 Pain in left shoulder: Secondary | ICD-10-CM | POA: Diagnosis not present

## 2019-06-06 DIAGNOSIS — M25562 Pain in left knee: Secondary | ICD-10-CM | POA: Diagnosis not present

## 2019-06-08 DIAGNOSIS — G894 Chronic pain syndrome: Secondary | ICD-10-CM | POA: Diagnosis not present

## 2019-06-08 DIAGNOSIS — M25512 Pain in left shoulder: Secondary | ICD-10-CM | POA: Diagnosis not present

## 2019-06-08 DIAGNOSIS — M25561 Pain in right knee: Secondary | ICD-10-CM | POA: Diagnosis not present

## 2019-06-08 DIAGNOSIS — M159 Polyosteoarthritis, unspecified: Secondary | ICD-10-CM | POA: Diagnosis not present

## 2019-06-08 DIAGNOSIS — M25562 Pain in left knee: Secondary | ICD-10-CM | POA: Diagnosis not present

## 2019-06-08 DIAGNOSIS — M546 Pain in thoracic spine: Secondary | ICD-10-CM | POA: Diagnosis not present

## 2019-06-09 DIAGNOSIS — R2681 Unsteadiness on feet: Secondary | ICD-10-CM | POA: Diagnosis not present

## 2019-06-09 DIAGNOSIS — R109 Unspecified abdominal pain: Secondary | ICD-10-CM | POA: Diagnosis not present

## 2019-06-13 DIAGNOSIS — M25512 Pain in left shoulder: Secondary | ICD-10-CM | POA: Diagnosis not present

## 2019-06-13 DIAGNOSIS — M25561 Pain in right knee: Secondary | ICD-10-CM | POA: Diagnosis not present

## 2019-06-13 DIAGNOSIS — M25562 Pain in left knee: Secondary | ICD-10-CM | POA: Diagnosis not present

## 2019-06-13 DIAGNOSIS — G894 Chronic pain syndrome: Secondary | ICD-10-CM | POA: Diagnosis not present

## 2019-06-13 DIAGNOSIS — M546 Pain in thoracic spine: Secondary | ICD-10-CM | POA: Diagnosis not present

## 2019-06-13 DIAGNOSIS — M159 Polyosteoarthritis, unspecified: Secondary | ICD-10-CM | POA: Diagnosis not present

## 2019-06-15 DIAGNOSIS — R609 Edema, unspecified: Secondary | ICD-10-CM | POA: Diagnosis not present

## 2019-06-15 DIAGNOSIS — B351 Tinea unguium: Secondary | ICD-10-CM | POA: Diagnosis not present

## 2019-06-15 DIAGNOSIS — I739 Peripheral vascular disease, unspecified: Secondary | ICD-10-CM | POA: Diagnosis not present

## 2019-06-17 DIAGNOSIS — M159 Polyosteoarthritis, unspecified: Secondary | ICD-10-CM | POA: Diagnosis not present

## 2019-06-17 DIAGNOSIS — M25512 Pain in left shoulder: Secondary | ICD-10-CM | POA: Diagnosis not present

## 2019-06-17 DIAGNOSIS — M25561 Pain in right knee: Secondary | ICD-10-CM | POA: Diagnosis not present

## 2019-06-17 DIAGNOSIS — M25562 Pain in left knee: Secondary | ICD-10-CM | POA: Diagnosis not present

## 2019-06-17 DIAGNOSIS — M546 Pain in thoracic spine: Secondary | ICD-10-CM | POA: Diagnosis not present

## 2019-06-17 DIAGNOSIS — G894 Chronic pain syndrome: Secondary | ICD-10-CM | POA: Diagnosis not present

## 2019-06-19 DIAGNOSIS — M546 Pain in thoracic spine: Secondary | ICD-10-CM | POA: Diagnosis not present

## 2019-06-19 DIAGNOSIS — G894 Chronic pain syndrome: Secondary | ICD-10-CM | POA: Diagnosis not present

## 2019-06-19 DIAGNOSIS — M25561 Pain in right knee: Secondary | ICD-10-CM | POA: Diagnosis not present

## 2019-06-19 DIAGNOSIS — M25562 Pain in left knee: Secondary | ICD-10-CM | POA: Diagnosis not present

## 2019-06-19 DIAGNOSIS — M159 Polyosteoarthritis, unspecified: Secondary | ICD-10-CM | POA: Diagnosis not present

## 2019-06-19 DIAGNOSIS — M25512 Pain in left shoulder: Secondary | ICD-10-CM | POA: Diagnosis not present

## 2019-06-21 DIAGNOSIS — G894 Chronic pain syndrome: Secondary | ICD-10-CM | POA: Diagnosis not present

## 2019-06-21 DIAGNOSIS — M25512 Pain in left shoulder: Secondary | ICD-10-CM | POA: Diagnosis not present

## 2019-06-21 DIAGNOSIS — M159 Polyosteoarthritis, unspecified: Secondary | ICD-10-CM | POA: Diagnosis not present

## 2019-06-21 DIAGNOSIS — M546 Pain in thoracic spine: Secondary | ICD-10-CM | POA: Diagnosis not present

## 2019-06-21 DIAGNOSIS — M25561 Pain in right knee: Secondary | ICD-10-CM | POA: Diagnosis not present

## 2019-06-21 DIAGNOSIS — M25562 Pain in left knee: Secondary | ICD-10-CM | POA: Diagnosis not present

## 2019-06-27 DIAGNOSIS — M25512 Pain in left shoulder: Secondary | ICD-10-CM | POA: Diagnosis not present

## 2019-06-27 DIAGNOSIS — I2510 Atherosclerotic heart disease of native coronary artery without angina pectoris: Secondary | ICD-10-CM | POA: Diagnosis not present

## 2019-06-27 DIAGNOSIS — M25562 Pain in left knee: Secondary | ICD-10-CM | POA: Diagnosis not present

## 2019-06-27 DIAGNOSIS — F329 Major depressive disorder, single episode, unspecified: Secondary | ICD-10-CM | POA: Diagnosis not present

## 2019-06-27 DIAGNOSIS — I10 Essential (primary) hypertension: Secondary | ICD-10-CM | POA: Diagnosis not present

## 2019-06-27 DIAGNOSIS — F419 Anxiety disorder, unspecified: Secondary | ICD-10-CM | POA: Diagnosis not present

## 2019-06-27 DIAGNOSIS — M546 Pain in thoracic spine: Secondary | ICD-10-CM | POA: Diagnosis not present

## 2019-06-27 DIAGNOSIS — W19XXXS Unspecified fall, sequela: Secondary | ICD-10-CM | POA: Diagnosis not present

## 2019-06-27 DIAGNOSIS — G894 Chronic pain syndrome: Secondary | ICD-10-CM | POA: Diagnosis not present

## 2019-06-27 DIAGNOSIS — M159 Polyosteoarthritis, unspecified: Secondary | ICD-10-CM | POA: Diagnosis not present

## 2019-06-27 DIAGNOSIS — Z96653 Presence of artificial knee joint, bilateral: Secondary | ICD-10-CM | POA: Diagnosis not present

## 2019-06-27 DIAGNOSIS — M25561 Pain in right knee: Secondary | ICD-10-CM | POA: Diagnosis not present

## 2019-06-27 DIAGNOSIS — I4891 Unspecified atrial fibrillation: Secondary | ICD-10-CM | POA: Diagnosis not present

## 2019-06-27 DIAGNOSIS — Z79891 Long term (current) use of opiate analgesic: Secondary | ICD-10-CM | POA: Diagnosis not present

## 2019-06-27 DIAGNOSIS — Z9181 History of falling: Secondary | ICD-10-CM | POA: Diagnosis not present

## 2019-06-29 DIAGNOSIS — M25512 Pain in left shoulder: Secondary | ICD-10-CM | POA: Diagnosis not present

## 2019-06-29 DIAGNOSIS — G894 Chronic pain syndrome: Secondary | ICD-10-CM | POA: Diagnosis not present

## 2019-06-29 DIAGNOSIS — M25562 Pain in left knee: Secondary | ICD-10-CM | POA: Diagnosis not present

## 2019-06-29 DIAGNOSIS — M25561 Pain in right knee: Secondary | ICD-10-CM | POA: Diagnosis not present

## 2019-06-29 DIAGNOSIS — M159 Polyosteoarthritis, unspecified: Secondary | ICD-10-CM | POA: Diagnosis not present

## 2019-06-29 DIAGNOSIS — M546 Pain in thoracic spine: Secondary | ICD-10-CM | POA: Diagnosis not present

## 2019-07-04 DIAGNOSIS — I10 Essential (primary) hypertension: Secondary | ICD-10-CM | POA: Diagnosis not present

## 2019-07-04 DIAGNOSIS — R399 Unspecified symptoms and signs involving the genitourinary system: Secondary | ICD-10-CM | POA: Diagnosis not present

## 2019-07-04 DIAGNOSIS — R2681 Unsteadiness on feet: Secondary | ICD-10-CM | POA: Diagnosis not present

## 2019-07-04 DIAGNOSIS — F339 Major depressive disorder, recurrent, unspecified: Secondary | ICD-10-CM | POA: Diagnosis not present

## 2019-10-21 ENCOUNTER — Other Ambulatory Visit: Payer: Self-pay

## 2019-10-21 ENCOUNTER — Emergency Department (HOSPITAL_COMMUNITY)
Admission: EM | Admit: 2019-10-21 | Discharge: 2019-10-22 | Disposition: A | Discharge status: Home / Self Care | Payer: Medicare Other | Attending: Emergency Medicine | Admitting: Emergency Medicine

## 2019-10-21 ENCOUNTER — Emergency Department (HOSPITAL_COMMUNITY): Payer: Medicare Other

## 2019-10-21 ENCOUNTER — Encounter (HOSPITAL_COMMUNITY): Payer: Self-pay | Admitting: Emergency Medicine

## 2019-10-21 DIAGNOSIS — I10 Essential (primary) hypertension: Secondary | ICD-10-CM | POA: Diagnosis not present

## 2019-10-21 DIAGNOSIS — J449 Chronic obstructive pulmonary disease, unspecified: Secondary | ICD-10-CM | POA: Insufficient documentation

## 2019-10-21 DIAGNOSIS — M25512 Pain in left shoulder: Secondary | ICD-10-CM | POA: Diagnosis present

## 2019-10-21 DIAGNOSIS — Z7982 Long term (current) use of aspirin: Secondary | ICD-10-CM | POA: Diagnosis not present

## 2019-10-21 DIAGNOSIS — I480 Paroxysmal atrial fibrillation: Secondary | ICD-10-CM

## 2019-10-21 DIAGNOSIS — I2510 Atherosclerotic heart disease of native coronary artery without angina pectoris: Secondary | ICD-10-CM | POA: Insufficient documentation

## 2019-10-21 DIAGNOSIS — Z79899 Other long term (current) drug therapy: Secondary | ICD-10-CM | POA: Insufficient documentation

## 2019-10-21 DIAGNOSIS — E119 Type 2 diabetes mellitus without complications: Secondary | ICD-10-CM | POA: Insufficient documentation

## 2019-10-21 DIAGNOSIS — R072 Precordial pain: Secondary | ICD-10-CM

## 2019-10-21 LAB — COMPREHENSIVE METABOLIC PANEL
ALT: 14 U/L (ref 0–44)
AST: 16 U/L (ref 15–41)
Albumin: 3.7 g/dL (ref 3.5–5.0)
Alkaline Phosphatase: 75 U/L (ref 38–126)
Anion gap: 7 (ref 5–15)
BUN: 14 mg/dL (ref 8–23)
CO2: 32 mmol/L (ref 22–32)
Calcium: 9 mg/dL (ref 8.9–10.3)
Chloride: 99 mmol/L (ref 98–111)
Creatinine, Ser: 0.75 mg/dL (ref 0.44–1.00)
GFR calc Af Amer: >60 mL/min (ref >60)
GFR calc non Af Amer: >60 mL/min (ref >60)
Glucose, Bld: 107 mg/dL — ABNORMAL HIGH (ref 70–99)
Potassium: 4.1 mmol/L (ref 3.5–5.1)
Sodium: 138 mmol/L (ref 135–145)
Total Bilirubin: 0.7 mg/dL (ref 0.3–1.2)
Total Protein: 6.7 g/dL (ref 6.5–8.1)

## 2019-10-21 LAB — CBC WITH DIFFERENTIAL/PLATELET
Abs Immature Granulocytes: 0.03 10*3/uL (ref 0.00–0.07)
Basophils Absolute: 0 10*3/uL (ref 0.0–0.1)
Basophils Relative: 0 %
Eosinophils Absolute: 0.2 10*3/uL (ref 0.0–0.5)
Eosinophils Relative: 2 %
HCT: 39.7 % (ref 36.0–46.0)
Hemoglobin: 12.5 g/dL (ref 12.0–15.0)
Immature Granulocytes: 0 %
Lymphocytes Relative: 31 %
Lymphs Abs: 2.3 10*3/uL (ref 0.7–4.0)
MCH: 30.3 pg (ref 26.0–34.0)
MCHC: 31.5 g/dL (ref 30.0–36.0)
MCV: 96.1 fL (ref 80.0–100.0)
Monocytes Absolute: 0.6 10*3/uL (ref 0.1–1.0)
Monocytes Relative: 8 %
Neutro Abs: 4.3 10*3/uL (ref 1.7–7.7)
Neutrophils Relative %: 59 %
Platelets: 148 10*3/uL — ABNORMAL LOW (ref 150–400)
RBC: 4.13 MIL/uL (ref 3.87–5.11)
RDW: 12.9 % (ref 11.5–15.5)
WBC: 7.3 10*3/uL (ref 4.0–10.5)
nRBC: 0 % (ref 0.0–0.2)

## 2019-10-21 LAB — LIPASE, BLOOD: Lipase: 20 U/L (ref 11–51)

## 2019-10-21 LAB — TROPONIN I (HIGH SENSITIVITY)
Troponin I (High Sensitivity): 10 ng/L (ref <18)
Troponin I (High Sensitivity): 10 ng/L (ref <18)

## 2019-10-21 LAB — LACTIC ACID, PLASMA: Lactic Acid, Venous: 1 mmol/L (ref 0.5–1.9)

## 2019-10-21 LAB — BRAIN NATRIURETIC PEPTIDE: B Natriuretic Peptide: 138.4 pg/mL — ABNORMAL HIGH (ref 0.0–100.0)

## 2019-10-21 LAB — PROTIME-INR
INR: 1.2 (ref 0.8–1.2)
Prothrombin Time: 14.6 seconds (ref 11.4–15.2)

## 2019-10-21 MED ORDER — ASPIRIN EC 81 MG PO TBEC: 81.0000 mg | DELAYED_RELEASE_TABLET | Freq: Every day | ORAL | 1 refills | Status: DC

## 2019-10-21 MED ORDER — PANTOPRAZOLE SODIUM 20 MG PO TBEC
20.0000 mg | DELAYED_RELEASE_TABLET | Freq: Once | ORAL | Status: AC
  Administered 2019-10-21: 20 mg via ORAL
  Filled 2019-10-21: qty 1

## 2019-10-21 MED ORDER — PANTOPRAZOLE SODIUM 20 MG PO TBEC: 20.0000 mg | DELAYED_RELEASE_TABLET | Freq: Every day | ORAL | 0 refills | Status: DC

## 2019-10-21 MED ORDER — ASPIRIN EC 81 MG PO TBEC
81.0000 mg | DELAYED_RELEASE_TABLET | Freq: Once | ORAL | Status: AC
  Administered 2019-10-21: 81 mg via ORAL
  Filled 2019-10-21: qty 1

## 2019-10-21 NOTE — ED Triage Notes (Signed)
Pt BIB EMS from [facility]. Pt c/o left shoulder pain, pt has chronic left shoulder pain. Pt stated she had chest pain yesterday but denied it today with EMS. PT has hx of A fib.    20G R FA EMS gave 324 mg aspirin  142/88 80 HR 98% RA

## 2019-10-21 NOTE — ED Provider Notes (Signed)
[facility] DEPT Provider Note   CSN: [id number] Arrival date & time: 10/21/19  1753     History Chief Complaint  Patient presents with  . Left Shoulder Pain    Shirley Leblanc is a 74 y.o. female.  HPI Patient reports that yesterday, for most of the day she had chest pain that was coming and going.  She reports she felt it most in the front of her chest with some radiation towards her left shoulder and arm.  She reports it only seemed much better when she went to sleep.  She reports she rested several times and the pain seemed to go away.  She reports is gone today except for pain is in her left shoulder if she moves it a certain way.  She was however concerned about the possibility of heart attack.  She denies she has had a recent cough, fever or sputum production.  No pain in the calves or behind the knees.  She reports she does have problems with a lot of gas and bloating that builds up overnight.  I did inquire as to whether the patient supposed be on chronic oxygen as EMR review suggests, she reports that she could not really manage the tanks in the tubing and does not use home oxygen.  Patient does not think she is on a daily aspirin since being at the assisted living.  She thinks she used to be on Eliquis but that it got too expensive and that is why she is no longer taking it.    Past Medical History:  Diagnosis Date  . Bursitis   . CAD (coronary artery disease)    a. nonobstructive CAD with 40% Prox LAD stenosis by cath in 2007  . COPD (chronic obstructive pulmonary disease) ([place])   . Depression   . DJD (degenerative joint disease)   . GERD (gastroesophageal reflux disease)   . Hyperlipemia   . Hypertension   . Left wrist fracture   . Obesities, morbid ([place])   . Open left ankle fracture   . Syncope 11/2017  . Urinary incontinence     Patient Active Problem List   Diagnosis Date Noted  . Dizziness   . Tremor 09/18/2017  . Encephalopathy     . Tremors of nervous system   . Near syncope 09/16/2017  . Syncope 09/15/2017  . CAD (coronary artery disease) 09/15/2017  . At [facility] for fall 05/15/2017  . Chest pain 04/28/2017  . Osteopenia 02/02/2017  . Morbid obesity ([place]) 11/13/2016  . Elevated troponin 11/13/2016  . Chronic back pain 10/10/2016  . Pain medication agreement signed 10/10/2016  . Opioid dependence ([place]) 10/10/2016  . Diabetes ([place]) 09/19/2016  . Depression 04/18/2016  . Hypothyroid 02/22/2016  . Hyperlipidemia associated with type 2 diabetes mellitus ([place]) 02/22/2016  . Chronic anticoagulation - Eliquis, CHADS2VASC=3 06/05/2015  . Atrial fibrillation ([place]) 05/16/2015  . S/P laparoscopic cholecystectomy   . Hypertension associated with diabetes ([place]) 05/09/2015  . Dyspnea on exertion 03/14/2011    Past Surgical History:  Procedure Laterality Date  . CARDIAC CATHETERIZATION    . CHOLECYSTECTOMY N/A 05/14/2015   Procedure: LAPAROSCOPIC CHOLECYSTECTOMY;  Surgeon: Coralie Keens, MD;  Location: [facility];  Service: General;  Laterality: N/A;  . NECK SURGERY    . TOTAL KNEE ARTHROPLASTY     x2  . VAGINAL HYSTERECTOMY       OB History   No obstetric history on file.     Family History  Problem Relation Age of Onset  . Lung disease Mother   . Heart attack Father   . Lung disease Other   . Heart attack Other   . Cancer Daughter        breast    Social History   Tobacco Use  . Smoking status: Never Smoker  . Smokeless tobacco: Never Used  Substance Use Topics  . Alcohol use: No  . Drug use: No    Home Medications Prior to Admission medications   Medication Sig Start Date End Date Taking? Authorizing Provider  albuterol (PROVENTIL HFA;VENTOLIN HFA) 108 (90 Base) MCG/ACT inhaler TAKE 2 PUFFS BY MOUTH EVERY 6 HOURS AS NEEDED FOR WHEEZE OR SHORTNESS OF BREATH Patient taking differently: Inhale 2 puffs into the lungs every 6 (six) hours as needed for wheezing or shortness of breath.  08/07/17   Yes Hawks, Christy A, FNP  atorvastatin (LIPITOR) 10 MG tablet TAKE 1 TABLET BY MOUTH EVERY DAY 11/04/17  Yes Hawks, Christy A, FNP  Camphor-Menthol-Methyl Sal (SALONPAS) 3.08-02-08 % PTCH Apply 1 patch topically every 12 (twelve) hours as needed (pain).   Yes [provider]  diclofenac Sodium (VOLTAREN) 1 % GEL Apply 1 application topically in the morning and at bedtime.  10/13/19  Yes [provider]  furosemide (LASIX) 20 MG tablet Take 1 tablet (20 mg total) by mouth daily. 11/03/17  Yes Hawks, Christy A, FNP  hydrocortisone cream 1 % Apply 1 application topically 2 (two) times daily as needed for itching.   Yes [provider]  hydrOXYzine (ATARAX/VISTARIL) 50 MG tablet TAKE 1 TABLET (50 MG TOTAL) BY MOUTH 3 (THREE) TIMES DAILY AS NEEDED. Patient taking differently: Take 50 mg by mouth daily.  11/02/17  Yes Hawks, Christy A, FNP  levothyroxine (SYNTHROID, LEVOTHROID) 50 MCG tablet TAKE 1 TABLET (50 MCG TOTAL) BY MOUTH DAILY. 05/04/17  Yes Martin, Mary-Margaret, FNP  LORazepam (ATIVAN) 1 MG tablet Take 0.5 mg by mouth every 12 (twelve) hours as needed for anxiety.   Yes [provider]  meclizine (ANTIVERT) 25 MG tablet Take 1 tablet (25 mg total) by mouth 3 (three) times daily as needed for dizziness. 10/16/17  Yes Hawks, Christy A, FNP  Menthol, Topical Analgesic, 4 % GEL Apply 1 application topically 2 (two) times daily as needed (pain).   Yes [provider]  [facility] powder APPLY TOPICALLY FOUR TIMES A DAY Patient taking differently: Apply 1 application topically 2 (two) times daily.  03/04/18  Yes Hawks, Christy A, FNP  ondansetron (ZOFRAN) 4 MG tablet Take 4 mg by mouth 2 (two) times daily.  10/10/19  Yes [provider]  oxyCODONE-acetaminophen (PERCOCET) 10-325 MG tablet Take 1 tablet by mouth in the morning, at noon, in the evening, and at bedtime.  07/12/14  Yes [provider]  pantoprazole (PROTONIX) 20 MG tablet Take 20 mg by mouth  daily. 10/10/19  Yes [provider]  polyethylene glycol (MIRALAX / GLYCOLAX) 17 g packet Take 17 g by mouth daily.   Yes [provider]  psyllium (REGULOID) 0.52 g capsule Take 0.52 g by mouth at bedtime.   Yes [provider]  quinapril (ACCUPRIL) 40 MG tablet Take 1 tablet (40 mg total) by mouth daily. 11/03/17  Yes Hawks, Christy A, FNP  senna (SENOKOT) 8.6 MG tablet Take 2 tablets by mouth at bedtime.   Yes [provider]  sertraline (ZOLOFT) 100 MG tablet TAKE 2 TABLETS (200 MG TOTAL) BY MOUTH DAILY. (NEEDS TO BE SEEN  BEFORE NEXT REFILL) 08/25/18  Yes Hawks, Christy A, FNP  simethicone (MYLICON) 742 MG chewable tablet Chew 125 mg by mouth every 6 (six) hours as needed for flatulence.    Yes [provider]  tamsulosin (FLOMAX) 0.4 MG CAPS capsule Take 0.4 mg by mouth daily. 10/10/19  Yes [provider]  aspirin EC 81 MG tablet Take 1 tablet (81 mg total) by mouth daily. 10/21/19   Charlesetta Shanks, MD  blood glucose meter kit and supplies KIT Dispense per insurance preference. Use up to four times daily . E 11.9 09/30/16   Hawks, Christy A, FNP  buPROPion (WELLBUTRIN XL) 150 MG 24 hr tablet Take 1 tablet (150 mg total) by mouth daily. Patient not taking: Reported on 10/21/2019 03/18/18   Dettinger, Fransisca Kaufmann, MD  clonazePAM (KLONOPIN) 0.5 MG tablet Take 1 tablet (0.5 mg total) by mouth 2 (two) times daily as needed for anxiety. Patient not taking: Reported on 10/21/2019 10/12/17   Evelina Dun A, FNP  glucose blood (GLUCOSE METER TEST) test strip Use BID 09/29/16   Evelina Dun A, FNP  ondansetron (ZOFRAN) 8 MG tablet Take 8 mg by mouth every 8 (eight) hours as needed for nausea or vomiting.    [provider]  oxyCODONE-acetaminophen (PERCOCET) 7.5-325 MG tablet Take 1 tablet by mouth every 8 (eight) hours as needed for severe pain. Patient not taking: Reported on 12/01/2017 10/13/17   Evelina Dun A, FNP  pantoprazole (PROTONIX) 20 MG  tablet Take 1 tablet (20 mg total) by mouth daily. 10/21/19   Charlesetta Shanks, MD  quinapril (ACCUPRIL) 40 MG tablet TAKE 1 TABLET BY MOUTH EVERY DAY Patient not taking: Reported on 12/01/2017 11/04/17   [name], FNP    Allergies    Contrast media [iodinated diagnostic agents], Penicillins, and Sulfa antibiotics  Review of Systems   Review of Systems 10 Systems reviewed and are negative for acute change except as noted in the HPI.  Physical Exam Updated Vital Signs BP (!) 152/137 (BP Location: Right Arm)   Pulse 75   Temp 98 F (36.7 C) (Oral)   Resp 15   Ht 6' (1.829 m)   Wt 122.5 kg   SpO2 91%   BMI 36.62 kg/m   Physical Exam Constitutional:      Comments: Patient is alert and nontoxic.  No respiratory distress.  Mental status clear.  Eyes:     Extraocular Movements: Extraocular movements intact.  Cardiovascular:     Comments: Irregularly irregular no rub murmur gallop Pulmonary:     Effort: Pulmonary effort is normal.     Breath sounds: Normal breath sounds.  Abdominal:     General: There is no distension.     Palpations: Abdomen is soft.     Tenderness: There is no abdominal tenderness. There is no guarding.  Musculoskeletal:        General: No swelling or tenderness. Normal range of motion.     Comments: Calf soft and nontender.  No peripheral edema.  Skin:    General: Skin is warm and dry.  Neurological:     General: No focal deficit present.     Mental Status: She is oriented to person, place, and time.     Coordination: Coordination normal.  Psychiatric:        Mood and Affect: Mood normal.     ED Results / Procedures / Treatments   Labs (all labs ordered are listed, but only abnormal results are displayed) Labs Reviewed  COMPREHENSIVE METABOLIC PANEL - Abnormal; Notable for the following components:      Result Value   Glucose, Bld 107 (*)    All other components within normal limits  BRAIN NATRIURETIC PEPTIDE - Abnormal; Notable for the  following components:   B Natriuretic Peptide 138.4 (*)    All other components within normal limits  CBC WITH DIFFERENTIAL/PLATELET - Abnormal; Notable for the following components:   Platelets 148 (*)    All other components within normal limits  URINE CULTURE  LIPASE, BLOOD  LACTIC ACID, PLASMA  PROTIME-INR  TROPONIN I (HIGH SENSITIVITY)  TROPONIN I (HIGH SENSITIVITY)    EKG EKG Interpretation  Date/Time:  Friday October 21 2019 19:02:42 EDT Ventricular Rate:  77 PR Interval:    QRS Duration: 123 QT Interval:  416 QTC Calculation: 471 R Axis:   -64 Text Interpretation: Atrial fibrillation Nonspecific IVCD with LAD agree. no acute ischemic appearance. IVCD unchanged from previous Confirmed by Charlesetta Shanks [phone]) on 10/21/2019 10:56:06 PM   Radiology DG Chest Port 1 View  Result Date: 10/21/2019 CLINICAL DATA:  Chest pain EXAM: PORTABLE CHEST 1 VIEW COMPARISON:  12/01/2017 chest radiograph. FINDINGS: Surgical hardware from ACDF overlies the lower cervical spine. Right back spinal stimulator with lead tips overlying the lower thoracic spine. Stable cardiomediastinal silhouette with mild cardiomegaly. No pneumothorax. No pleural effusion. Cephalization of the pulmonary vasculature without overt pulmonary edema. No acute consolidative airspace disease. IMPRESSION: Stable mild cardiomegaly without overt pulmonary edema. No active pulmonary disease. Electronically Signed   By: Ilona Sorrel M.D.   On: 10/21/2019 19:25    Procedures Procedures (including critical care time)  Medications Ordered in ED Medications  aspirin EC tablet 81 mg (has no administration in time range)  pantoprazole (PROTONIX) EC tablet 20 mg (has no administration in time range)    ED Course  I have reviewed the triage vital signs and the nursing notes.  Pertinent labs & imaging results that were available during my care of the patient were reviewed by me and considered in my medical decision making (see  chart for details).    MDM Rules/Calculators/A&P                     I did update the patient's daughter on findings and diagnostic results.  We also reviewed the historical use of aspirin and Eliquis for atrial fibrillation.  She does not think that her mother is getting either those medications at assisted living.  She also did not seem to think that she was getting Protonix or other PPI.  We discussed the plan for initiating daily aspirin and Protonix  with close follow-up with cardiology.  Patient's daughter did reports that her mother was having very frequent falls before going into assisted living.  That may have been a reason for having discontinued routine anticoagulation for paroxysmal atrial fibrillation.  Patient is alert and appropriate.  She describes chest pain yesterday, most of the day off and on.  2 sets of troponin is normal.  Patient is asymptomatic at this time.  She does have atrial fibrillation that is rate controlled.  EMR indicates this is been paroxysmal.  The patient and her daughter are counseled on close follow-up with cardiology to clarify her level of anticoagulation.  Patient is started on a daily aspirin as she does not believe that she has been taking that.  Patient and her daughter also counseled on necessity to clarify recommended chronic O2 use.  Patient describes not  using it due to inconvenience of the tubing etc.  Notably her O2 sat has remained from lower 90s to mid 90s consistently on room air.  This time patient appears stable for discharge.  Precautions reviewed.  Follow-up plan reviewed. Final Clinical Impression(s) / ED Diagnoses Final diagnoses:  Precordial chest pain  Paroxysmal atrial fibrillation ([place])    Rx / DC Orders ED Discharge Orders         Ordered    aspirin EC 81 MG tablet  Daily     10/21/19 2323    pantoprazole (PROTONIX) 20 MG tablet  Daily     10/21/19 2323           Charlesetta Shanks, MD 10/21/19 2335

## 2019-10-21 NOTE — Discharge Instructions (Addendum)
1.  Take a daily aspirin. 2.  Take a daily Protonix. 3.  Make an appointment to see your cardiologist for recheck as soon as possible.  You have atrial fibrillation.  Cardiologist needs to review the best approach for blood thinner medications for your case.  You have been on them in the past but report you are not taking them currently. 4.  Return to the emergency department if you get any recurrence of pain, shortness of breath or other concerning symptoms.

## 2019-10-23 LAB — URINE CULTURE

## 2019-10-24 ENCOUNTER — Telehealth (HOSPITAL_COMMUNITY): Payer: Self-pay

## 2019-10-24 NOTE — Telephone Encounter (Signed)
Left voicemail for Christy(daughter) to reach out to schedule follow-up appointment.

## 2019-11-03 ENCOUNTER — Other Ambulatory Visit: Payer: Self-pay | Admitting: Gastroenterology

## 2019-11-07 NOTE — Progress Notes (Signed)
Called and requested current MAR from [place] at [facility].  They are to fax.

## 2019-11-08 ENCOUNTER — Encounter (HOSPITAL_COMMUNITY): Payer: Self-pay

## 2019-11-08 ENCOUNTER — Other Ambulatory Visit (HOSPITAL_COMMUNITY): Payer: Medicare Other | Attending: Gastroenterology

## 2019-11-09 NOTE — Progress Notes (Signed)
Preop instructions for:     Shirley Leblanc                     Date of Birth                            Date of Procedure: February 06, 1946      Doctor:DR Carol Ada  Time to arrive at [facility]:  1030am Report to: Admitting  Procedure:EGD   Do not eat or drink past midnight the night before your procedure.(To include any tube feedings-must be discontinued)    Take these morning medications only with sips of water.(or give through gastrostomy or feeding tube). Use inhalers as usual and bring  If takes in am take the following medications:  Synthroid, Protonix, Zoloft, Flomax  Note: No Insulin or Diabetic meds should be given or taken the morning of the procedure!   Facility contact:  Nanine Means at [facility]: [place]:  Transportation contact phone#: Montez Hageman- daughter [phone] Please send day of procedure:current med list and meds last taken that day, confirm nothing by mouth status from what time, Patient Demographic info( to include DNR status, problem list, allergies)   RN contact name/phone#:                             and Fax #:[phone]  [employer] card and picture ID Leave all jewelry and other valuables at place where living( no metal or rings to be worn) No contact lens Women-no make-up, no lotions,perfumes,powders   Any questions day of procedure,call  Endoscopy [phone]   Sent from :[facility]                   [place]                   Fax:[phone]  Sent by :  Gillian Shields  RN

## 2019-11-10 ENCOUNTER — Other Ambulatory Visit (HOSPITAL_COMMUNITY)
Admission: RE | Admit: 2019-11-10 | Discharge: 2019-11-10 | Disposition: A | Discharge status: Home / Self Care | Payer: Medicare Other | Source: Ambulatory Visit | Attending: Gastroenterology | Admitting: Gastroenterology

## 2019-11-10 DIAGNOSIS — Z20822 Contact with and (suspected) exposure to covid-19: Secondary | ICD-10-CM | POA: Diagnosis not present

## 2019-11-10 DIAGNOSIS — Z01812 Encounter for preprocedural laboratory examination: Secondary | ICD-10-CM | POA: Diagnosis present

## 2019-11-10 LAB — SARS CORONAVIRUS 2 (TAT 6-24 HRS): SARS Coronavirus 2: NEGATIVE

## 2019-11-10 NOTE — Progress Notes (Signed)
Left a voice mail message for daughter , Shela Leff on 11/09/19 and also LVMM on 11/10/19 with time of procedure on 4/16 /21 with arrival time.

## 2019-11-11 ENCOUNTER — Ambulatory Visit (HOSPITAL_COMMUNITY): Payer: Medicare Other | Admitting: Certified Registered Nurse Anesthetist

## 2019-11-11 ENCOUNTER — Encounter (HOSPITAL_COMMUNITY)
Admission: RE | Disposition: A | Discharge status: Home / Self Care | Payer: Self-pay | Source: Home / Self Care | Attending: Gastroenterology

## 2019-11-11 ENCOUNTER — Other Ambulatory Visit: Payer: Self-pay

## 2019-11-11 ENCOUNTER — Ambulatory Visit (HOSPITAL_COMMUNITY)
Admission: RE | Admit: 2019-11-11 | Discharge: 2019-11-11 | Disposition: A | Discharge status: Home / Self Care | Payer: Medicare Other | Attending: Gastroenterology | Admitting: Gastroenterology

## 2019-11-11 DIAGNOSIS — E119 Type 2 diabetes mellitus without complications: Secondary | ICD-10-CM | POA: Diagnosis not present

## 2019-11-11 DIAGNOSIS — Z882 Allergy status to sulfonamides status: Secondary | ICD-10-CM | POA: Insufficient documentation

## 2019-11-11 DIAGNOSIS — J449 Chronic obstructive pulmonary disease, unspecified: Secondary | ICD-10-CM | POA: Diagnosis not present

## 2019-11-11 DIAGNOSIS — I2510 Atherosclerotic heart disease of native coronary artery without angina pectoris: Secondary | ICD-10-CM | POA: Diagnosis not present

## 2019-11-11 DIAGNOSIS — Z6841 Body Mass Index (BMI) 40.0 and over, adult: Secondary | ICD-10-CM | POA: Insufficient documentation

## 2019-11-11 DIAGNOSIS — K449 Diaphragmatic hernia without obstruction or gangrene: Secondary | ICD-10-CM | POA: Insufficient documentation

## 2019-11-11 DIAGNOSIS — R1310 Dysphagia, unspecified: Secondary | ICD-10-CM | POA: Diagnosis not present

## 2019-11-11 DIAGNOSIS — Z96653 Presence of artificial knee joint, bilateral: Secondary | ICD-10-CM | POA: Insufficient documentation

## 2019-11-11 DIAGNOSIS — Z91041 Radiographic dye allergy status: Secondary | ICD-10-CM | POA: Diagnosis not present

## 2019-11-11 DIAGNOSIS — K222 Esophageal obstruction: Secondary | ICD-10-CM | POA: Diagnosis not present

## 2019-11-11 DIAGNOSIS — Z880 Allergy status to penicillin: Secondary | ICD-10-CM | POA: Insufficient documentation

## 2019-11-11 HISTORY — PX: SAVORY DILATION: SHX5439

## 2019-11-11 HISTORY — PX: ESOPHAGOGASTRODUODENOSCOPY (EGD) WITH PROPOFOL: SHX5813

## 2019-11-11 LAB — GLUCOSE, CAPILLARY: Glucose-Capillary: 115 mg/dL — ABNORMAL HIGH (ref 70–99)

## 2019-11-11 SURGERY — ESOPHAGOGASTRODUODENOSCOPY (EGD) WITH PROPOFOL
Anesthesia: Monitor Anesthesia Care

## 2019-11-11 MED ORDER — LACTATED RINGERS IV SOLN
INTRAVENOUS | Status: DC
  Administered 2019-11-11: 1000 mL via INTRAVENOUS

## 2019-11-11 MED ORDER — PROPOFOL 500 MG/50ML IV EMUL
INTRAVENOUS | Status: DC | PRN
  Administered 2019-11-11: 100 ug/kg/min via INTRAVENOUS

## 2019-11-11 MED ORDER — SODIUM CHLORIDE 0.9 % IV SOLN: INTRAVENOUS | Status: DC

## 2019-11-11 SURGICAL SUPPLY — 15 items

## 2019-11-11 NOTE — Transfer of Care (Signed)
Immediate Anesthesia Transfer of Care Note  Patient: Shirley Leblanc  Procedure(s) Performed: Procedure(s): ESOPHAGOGASTRODUODENOSCOPY (EGD) WITH PROPOFOL (N/A) SAVORY DILATION (N/A)  Patient Location: PACU and Endoscopy Unit  Anesthesia Type:MAC  Level of Consciousness: awake, alert  and oriented  Airway & Oxygen Therapy: Patient Spontanous Breathing and Patient connected to nasal cannula oxygen  Post-op Assessment: Report given to RN and Post -op Vital signs reviewed and stable  Post vital signs: Reviewed and stable  Last Vitals:  Vitals:   11/11/19 0810  BP: (!) 147/73  Pulse: 86  Resp: 17  Temp: 36.7 C  SpO2: 16%    Complications: No apparent anesthesia complications

## 2019-11-11 NOTE — Discharge Instructions (Signed)

## 2019-11-11 NOTE — Anesthesia Preprocedure Evaluation (Addendum)
Anesthesia Evaluation  Patient identified by MRN, date of birth, ID band Patient awake    Reviewed: Allergy & Precautions, NPO status , Patient's Chart, lab work & pertinent test results  Airway Mallampati: II  TM Distance: >3 FB Neck ROM: Full    Dental   Pulmonary COPD,    Pulmonary exam normal        Cardiovascular hypertension, Normal cardiovascular exam+ dysrhythmias Atrial Fibrillation      Neuro/Psych Depression    GI/Hepatic GERD  Medicated and Controlled,  Endo/Other  diabetes, Type 2  Renal/GU      Musculoskeletal   Abdominal   Peds  Hematology   Anesthesia Other Findings   Reproductive/Obstetrics                            Anesthesia Physical Anesthesia Plan  ASA: III  Anesthesia Plan: MAC   Post-op Pain Management:    Induction: Intravenous  PONV Risk Score and Plan: 2 and Treatment may vary due to age or medical condition and Ondansetron  Airway Management Planned: Nasal Cannula  Additional Equipment:   Intra-op Plan:   Post-operative Plan:   Informed Consent: I have reviewed the patients History and Physical, chart, labs and discussed the procedure including the risks, benefits and alternatives for the proposed anesthesia with the patient or authorized representative who has indicated his/her understanding and acceptance.       Plan Discussed with: CRNA and Surgeon  Anesthesia Plan Comments:         Anesthesia Quick Evaluation

## 2019-11-11 NOTE — Anesthesia Postprocedure Evaluation (Signed)
Anesthesia Post Note  Patient: Shirley Leblanc  Procedure(s) Performed: ESOPHAGOGASTRODUODENOSCOPY (EGD) WITH PROPOFOL (N/A ) SAVORY DILATION (N/A )     Patient location during evaluation: PACU Anesthesia Type: MAC Level of consciousness: awake and alert Pain management: pain level controlled Vital Signs Assessment: post-procedure vital signs reviewed and stable Respiratory status: spontaneous breathing, nonlabored ventilation, respiratory function stable and patient connected to nasal cannula oxygen Cardiovascular status: stable and blood pressure returned to baseline Postop Assessment: no apparent nausea or vomiting Anesthetic complications: no    Last Vitals:  Vitals:   11/11/19 0935 11/11/19 0940  BP:  138/88  Pulse: 75 74  Resp: 14 12  Temp:    SpO2: 91% 92%    Last Pain:  Vitals:   11/11/19 0940  TempSrc:   PainSc: 0-No pain                 Tzion Wedel DAVID

## 2019-11-11 NOTE — Op Note (Signed)
[facility] Patient Name: Shirley Leblanc Procedure Date: 11/11/2019 MRN: [id number] Attending MD: Carol Ada , MD Date of Birth: 01-08-1946 CSN: [id number] Age: 74 Admit Type: Outpatient Procedure:                Upper GI endoscopy Indications:              Dysphagia Providers:                Carol Ada, MD, Elmer Ramp. Tilden Dome, RN, William Dalton, Technician Referring MD:              Medicines:                Propofol per Anesthesia Complications:            No immediate complications. Estimated Blood Loss:     Estimated blood loss: none. Estimated blood loss                            was minimal. Procedure:                Pre-Anesthesia Assessment:                           - Prior to the procedure, a History and Physical                            was performed, and patient medications and                            allergies were reviewed. The patient's tolerance of                            previous anesthesia was also reviewed. The risks                            and benefits of the procedure and the sedation                            options and risks were discussed with the patient.                            All questions were answered, and informed consent                            was obtained. Prior Anticoagulants: The patient has                            taken no previous anticoagulant or antiplatelet                            agents. ASA Grade Assessment: III - A patient with                            severe systemic disease.  After reviewing the risks                            and benefits, the patient was deemed in                            satisfactory condition to undergo the procedure.                           - Sedation was administered by an anesthesia                            professional. Deep sedation was attained.                           After obtaining informed consent, the endoscope was               passed under direct vision. Throughout the                            procedure, the patient's blood pressure, pulse, and                            oxygen saturations were monitored continuously. The                            [id number]-[id number] Olympus gastroscope was                            introduced through the mouth, and advanced to the                            second part of duodenum. The upper GI endoscopy was                            accomplished without difficulty. The patient                            tolerated the procedure well. Scope In: Scope Out: Findings:      One benign-appearing, intrinsic mild stenosis was found at the       gastroesophageal junction. This stenosis measured 1.7 cm (inner       diameter) x less than one cm (in length). The stenosis was traversed. A       guidewire was placed and the scope was withdrawn. Dilation was performed       with a Savary dilator with no resistance at 18 mm. The dilation site was       examined following endoscope reinsertion and showed complete resolution       of luminal narrowing. Estimated blood loss was minimal.      A 3 cm hiatal hernia was present.      The stomach was normal.      The examined duodenum was normal. Impression:               - Benign-appearing esophageal stenosis. Dilated.                           -  3 cm hiatal hernia.                           - Normal stomach.                           - Normal examined duodenum.                           - No specimens collected. Moderate Sedation:      Not Applicable - Patient had care per Anesthesia. Recommendation:           - Patient has a contact number available for                            emergencies. The signs and symptoms of potential                            delayed complications were discussed with the                            patient. Return to normal activities tomorrow.                            Written discharge  instructions were provided to the                            patient.                           - Resume previous diet.                           - Continue present medications.                           - Return to GI office in 4 weeks. Procedure Code(s):        --- Professional ---                           [phone], Esophagogastroduodenoscopy, flexible,                            transoral; with insertion of guide wire followed by                            passage of dilator(s) through esophagus over guide                            wire Diagnosis Code(s):        --- Professional ---                           K22.2, Esophageal obstruction                           K44.9, Diaphragmatic hernia without obstruction or  gangrene                           R13.10, Dysphagia, unspecified CPT copyright 2019 [employer]. All rights reserved. The codes documented in this report are preliminary and upon coder review may  be revised to meet current compliance requirements. Carol Ada, MD Carol Ada, MD 11/11/2019 9:08:12 AM This report has been signed electronically. Number of Addenda: 0

## 2019-11-11 NOTE — H&P (Signed)
  Shirley Leblanc HPI: The patient has a history of dysphagia to solid foods.  She is unable to pass the food bolus with drinking liquids.  An esophagram last year was negative for any overt strictures, but there was evidence of dysmotility.  She points to her sternal notch as the source of her dysphagia.  Past Medical History:  Diagnosis Date  . Bursitis   . CAD (coronary artery disease)    a. nonobstructive CAD with 40% Prox LAD stenosis by cath in 2007  . COPD (chronic obstructive pulmonary disease) ([place])   . Depression   . DJD (degenerative joint disease)   . GERD (gastroesophageal reflux disease)   . Hyperlipemia   . Hypertension   . Left wrist fracture   . Obesities, morbid ([place])   . Open left ankle fracture   . Syncope 11/2017  . Urinary incontinence     Past Surgical History:  Procedure Laterality Date  . CARDIAC CATHETERIZATION    . CHOLECYSTECTOMY N/A 05/14/2015   Procedure: LAPAROSCOPIC CHOLECYSTECTOMY;  Surgeon: Coralie Keens, MD;  Location: [facility];  Service: General;  Laterality: N/A;  . NECK SURGERY    . TOTAL KNEE ARTHROPLASTY     x2  . VAGINAL HYSTERECTOMY      Family History  Problem Relation Age of Onset  . Lung disease Mother   . Heart attack Father   . Lung disease Other   . Heart attack Other   . Cancer Daughter        breast    Social History:  reports that she has never smoked. She has never used smokeless tobacco. She reports that she does not drink alcohol or use drugs.  Allergies:  Allergies  Allergen Reactions  . Contrast Media [Iodinated Diagnostic Agents] Anaphylaxis       . Penicillins Hives, Itching and Rash    Has patient had a PCN reaction causing immediate rash, facial/tongue/throat swelling, SOB or lightheadedness with hypotension Yes Has patient had a PCN reaction causing severe rash involving mucus membranes or skin necrosis: Yes Has patient had a PCN reaction that required hospitalization No Has patient had a PCN reaction  occurring within the last 10 years: No If all of the above answers are "NO", then may proceed with Cephalosporin use.   . Sulfa Antibiotics Hives, Itching and Rash    Medications:  Scheduled:  Continuous: . sodium chloride    . lactated ringers 1,000 mL (11/11/19 0826)    Results for orders placed or performed during the hospital encounter of 11/11/19 (from the past 24 hour(s))  Glucose, capillary     Status: Abnormal   Collection Time: 11/11/19  8:05 AM  Result Value Ref Range   Glucose-Capillary 115 (H) 70 - 99 mg/dL     No results found.  ROS:  As stated above in the HPI otherwise negative.  Blood pressure (!) 147/73, pulse 86, temperature 98.1 F (36.7 C), temperature source Oral, resp. rate 17, height 5\' 6"  (1.676 m), weight 122.9 kg, SpO2 92 %.    PE: Gen: NAD, Alert and Oriented HEENT:  [place]/AT, EOMI Neck: Supple, no LAD Lungs: CTA Bilaterally CV: RRR without M/G/R ABM: Soft, NTND, +BS Ext: No C/C/E  Assessment/Plan: 1) Dysphagia - EGD with dilation.  Zerek Litsey D 11/11/2019, 8:28 AM

## 2019-11-14 ENCOUNTER — Encounter: Payer: Self-pay | Admitting: *Deleted

## 2020-09-20 ENCOUNTER — Emergency Department (HOSPITAL_COMMUNITY): Payer: Medicare Other

## 2020-09-20 ENCOUNTER — Emergency Department (HOSPITAL_COMMUNITY)
Admission: EM | Admit: 2020-09-20 | Discharge: 2020-09-20 | Disposition: A | Discharge status: Home / Self Care | Payer: Medicare Other | Attending: Emergency Medicine | Admitting: Emergency Medicine

## 2020-09-20 ENCOUNTER — Emergency Department (HOSPITAL_BASED_OUTPATIENT_CLINIC_OR_DEPARTMENT_OTHER): Payer: Medicare Other

## 2020-09-20 DIAGNOSIS — Z7901 Long term (current) use of anticoagulants: Secondary | ICD-10-CM | POA: Diagnosis not present

## 2020-09-20 DIAGNOSIS — R0902 Hypoxemia: Secondary | ICD-10-CM | POA: Diagnosis not present

## 2020-09-20 DIAGNOSIS — E1169 Type 2 diabetes mellitus with other specified complication: Secondary | ICD-10-CM | POA: Insufficient documentation

## 2020-09-20 DIAGNOSIS — I4891 Unspecified atrial fibrillation: Secondary | ICD-10-CM | POA: Diagnosis not present

## 2020-09-20 DIAGNOSIS — E785 Hyperlipidemia, unspecified: Secondary | ICD-10-CM | POA: Diagnosis not present

## 2020-09-20 DIAGNOSIS — Z96653 Presence of artificial knee joint, bilateral: Secondary | ICD-10-CM | POA: Insufficient documentation

## 2020-09-20 DIAGNOSIS — R600 Localized edema: Secondary | ICD-10-CM | POA: Diagnosis not present

## 2020-09-20 DIAGNOSIS — R062 Wheezing: Secondary | ICD-10-CM | POA: Diagnosis not present

## 2020-09-20 DIAGNOSIS — R079 Chest pain, unspecified: Secondary | ICD-10-CM | POA: Diagnosis present

## 2020-09-20 DIAGNOSIS — Z79899 Other long term (current) drug therapy: Secondary | ICD-10-CM | POA: Insufficient documentation

## 2020-09-20 DIAGNOSIS — Z20822 Contact with and (suspected) exposure to covid-19: Secondary | ICD-10-CM | POA: Insufficient documentation

## 2020-09-20 DIAGNOSIS — I2510 Atherosclerotic heart disease of native coronary artery without angina pectoris: Secondary | ICD-10-CM | POA: Insufficient documentation

## 2020-09-20 DIAGNOSIS — L0390 Cellulitis, unspecified: Secondary | ICD-10-CM

## 2020-09-20 DIAGNOSIS — M7989 Other specified soft tissue disorders: Secondary | ICD-10-CM | POA: Diagnosis not present

## 2020-09-20 DIAGNOSIS — J449 Chronic obstructive pulmonary disease, unspecified: Secondary | ICD-10-CM | POA: Diagnosis not present

## 2020-09-20 DIAGNOSIS — E039 Hypothyroidism, unspecified: Secondary | ICD-10-CM | POA: Diagnosis not present

## 2020-09-20 DIAGNOSIS — I10 Essential (primary) hypertension: Secondary | ICD-10-CM | POA: Insufficient documentation

## 2020-09-20 LAB — URINALYSIS, ROUTINE W REFLEX MICROSCOPIC
Bilirubin Urine: NEGATIVE
Glucose, UA: NEGATIVE mg/dL
Hgb urine dipstick: NEGATIVE
Ketones, ur: NEGATIVE mg/dL
Leukocytes,Ua: NEGATIVE
Nitrite: NEGATIVE
Protein, ur: NEGATIVE mg/dL
Specific Gravity, Urine: 1.011 (ref 1.005–1.030)
pH: 7 (ref 5.0–8.0)

## 2020-09-20 LAB — CBC WITH DIFFERENTIAL/PLATELET
Abs Immature Granulocytes: 0.01 10*3/uL (ref 0.00–0.07)
Basophils Absolute: 0 10*3/uL (ref 0.0–0.1)
Basophils Relative: 0 %
Eosinophils Absolute: 0.1 10*3/uL (ref 0.0–0.5)
Eosinophils Relative: 2 %
HCT: 42.3 % (ref 36.0–46.0)
Hemoglobin: 12.8 g/dL (ref 12.0–15.0)
Immature Granulocytes: 0 %
Lymphocytes Relative: 23 %
Lymphs Abs: 1.6 10*3/uL (ref 0.7–4.0)
MCH: 29.2 pg (ref 26.0–34.0)
MCHC: 30.3 g/dL (ref 30.0–36.0)
MCV: 96.6 fL (ref 80.0–100.0)
Monocytes Absolute: 0.5 10*3/uL (ref 0.1–1.0)
Monocytes Relative: 7 %
Neutro Abs: 4.8 10*3/uL (ref 1.7–7.7)
Neutrophils Relative %: 68 %
Platelets: 127 10*3/uL — ABNORMAL LOW (ref 150–400)
RBC: 4.38 MIL/uL (ref 3.87–5.11)
RDW: 13.4 % (ref 11.5–15.5)
WBC: 7.1 10*3/uL (ref 4.0–10.5)
nRBC: 0 % (ref 0.0–0.2)

## 2020-09-20 LAB — BASIC METABOLIC PANEL
Anion gap: 9 (ref 5–15)
BUN: 10 mg/dL (ref 8–23)
CO2: 31 mmol/L (ref 22–32)
Calcium: 9.3 mg/dL (ref 8.9–10.3)
Chloride: 99 mmol/L (ref 98–111)
Creatinine, Ser: 0.84 mg/dL (ref 0.44–1.00)
GFR, Estimated: >60 mL/min (ref >60)
Glucose, Bld: 107 mg/dL — ABNORMAL HIGH (ref 70–99)
Potassium: 4.6 mmol/L (ref 3.5–5.1)
Sodium: 139 mmol/L (ref 135–145)

## 2020-09-20 LAB — TROPONIN I (HIGH SENSITIVITY)
Troponin I (High Sensitivity): 16 ng/L (ref <18)
Troponin I (High Sensitivity): 17 ng/L (ref <18)

## 2020-09-20 LAB — BRAIN NATRIURETIC PEPTIDE: B Natriuretic Peptide: 128 pg/mL — ABNORMAL HIGH (ref 0.0–100.0)

## 2020-09-20 LAB — D-DIMER, QUANTITATIVE: D-Dimer, Quant: 1.1 ug/mL-FEU — ABNORMAL HIGH (ref 0.00–0.50)

## 2020-09-20 MED ORDER — DIPHENHYDRAMINE HCL 25 MG PO CAPS
50.0000 mg | ORAL_CAPSULE | Freq: Once | ORAL | Status: AC
  Filled 2020-09-20: qty 2

## 2020-09-20 MED ORDER — CEPHALEXIN 250 MG PO CAPS
500.0000 mg | ORAL_CAPSULE | Freq: Once | ORAL | Status: AC
  Administered 2020-09-20: 500 mg via ORAL
  Filled 2020-09-20: qty 2

## 2020-09-20 MED ORDER — CEPHALEXIN 500 MG PO CAPS: 500.0000 mg | ORAL_CAPSULE | Freq: Four times a day (QID) | ORAL | 0 refills | Status: AC

## 2020-09-20 MED ORDER — IOHEXOL 350 MG/ML SOLN
80.0000 mL | Freq: Once | INTRAVENOUS | Status: AC | PRN
  Administered 2020-09-20: 80 mL via INTRAVENOUS

## 2020-09-20 MED ORDER — HYDROCORTISONE NA SUCCINATE PF 250 MG IJ SOLR
200.0000 mg | Freq: Once | INTRAMUSCULAR | Status: AC
  Administered 2020-09-20: 200 mg via INTRAVENOUS
  Filled 2020-09-20 (×2): qty 200

## 2020-09-20 MED ORDER — DIPHENHYDRAMINE HCL 50 MG/ML IJ SOLN
50.0000 mg | Freq: Once | INTRAMUSCULAR | Status: AC
  Administered 2020-09-20: 50 mg via INTRAVENOUS
  Filled 2020-09-20: qty 1

## 2020-09-20 MED ORDER — CEPHALEXIN 500 MG PO CAPS: 500.0000 mg | ORAL_CAPSULE | Freq: Four times a day (QID) | ORAL | 0 refills | Status: DC

## 2020-09-20 NOTE — ED Notes (Signed)
Pt currently denies chest pain, voicing no complaints when asked, RLE, swelling and redness noted, pt reports this is more swelling than normal for her.

## 2020-09-20 NOTE — Discharge Instructions (Addendum)
Please follow-up with your primary care doctor regarding your symptoms.  Return to ER if you have any difficulty breathing, chest pain, or other new concerning symptom.  For the area of redness on your leg, please start the antibiotic as prescribed.

## 2020-09-20 NOTE — ED Notes (Signed)
Patient transported to CT 

## 2020-09-20 NOTE — ED Provider Notes (Signed)
[facility] EMERGENCY DEPARTMENT Provider Note   CSN: [id number] Arrival date & time: 09/20/20  1344     History Chief Complaint  Patient presents with  . Chest Pain    Shirley Leblanc is a 75 y.o. female.   Chest Pain Pain location:  L chest Pain quality: aching   Pain radiates to:  Does not radiate Pain severity:  Severe Onset quality:  Sudden Timing:  Constant Progression:  Resolved Chronicity:  Recurrent Context: at rest   Relieved by:  Nothing Worsened by:  Nothing Ineffective treatments:  None tried Associated symptoms: nausea   Associated symptoms: no back pain, no cough, no diaphoresis, no fever, no headache, no palpitations, no shortness of breath, no syncope and no vomiting        Past Medical History:  Diagnosis Date  . Bursitis   . CAD (coronary artery disease)    a. nonobstructive CAD with 40% Prox LAD stenosis by cath in 2007  . COPD (chronic obstructive pulmonary disease) ([place])   . Depression   . DJD (degenerative joint disease)   . GERD (gastroesophageal reflux disease)   . Hyperlipemia   . Hypertension   . Left wrist fracture   . Obesities, morbid ([place])   . Open left ankle fracture   . Syncope 11/2017  . Urinary incontinence     Patient Active Problem List   Diagnosis Date Noted  . Dizziness   . Tremor 09/18/2017  . Encephalopathy   . Tremors of nervous system   . Near syncope 09/16/2017  . Syncope 09/15/2017  . CAD (coronary artery disease) 09/15/2017  . At [facility] for fall 05/15/2017  . Chest pain 04/28/2017  . Osteopenia 02/02/2017  . Morbid obesity ([place]) 11/13/2016  . Elevated troponin 11/13/2016  . Chronic back pain 10/10/2016  . Pain medication agreement signed 10/10/2016  . Opioid dependence ([place]) 10/10/2016  . Diabetes ([place]) 09/19/2016  . Depression 04/18/2016  . Hypothyroid 02/22/2016  . Hyperlipidemia associated with type 2 diabetes mellitus ([place]) 02/22/2016  . Chronic anticoagulation - Eliquis,  CHADS2VASC=3 06/05/2015  . Atrial fibrillation ([place]) 05/16/2015  . S/P laparoscopic cholecystectomy   . Hypertension associated with diabetes ([place]) 05/09/2015  . Dyspnea on exertion 03/14/2011    Past Surgical History:  Procedure Laterality Date  . CARDIAC CATHETERIZATION    . CHOLECYSTECTOMY N/A 05/14/2015   Procedure: LAPAROSCOPIC CHOLECYSTECTOMY;  Surgeon: Coralie Keens, MD;  Location: [facility];  Service: General;  Laterality: N/A;  . ESOPHAGOGASTRODUODENOSCOPY (EGD) WITH PROPOFOL N/A 11/11/2019   Procedure: ESOPHAGOGASTRODUODENOSCOPY (EGD) WITH PROPOFOL;  Surgeon: Carol Ada, MD;  Location[facility];  Service: Endoscopy;  Laterality: N/A;  . NECK SURGERY    . SAVORY DILATION N/A 11/11/2019   Procedure: SAVORY DILATION;  Surgeon: Carol Ada, MD;  Location: [facility];  Service: Endoscopy;  Laterality: N/A;  . TOTAL KNEE ARTHROPLASTY     x2  . VAGINAL HYSTERECTOMY       OB History   No obstetric history on file.     Family History  Problem Relation Age of Onset  . Lung disease Mother   . Heart attack Father   . Lung disease Other   . Heart attack Other   . Cancer Daughter        breast    Social History   Tobacco Use  . Smoking status: Never Smoker  . Smokeless tobacco: Never Used  Vaping Use  . Vaping Use: Never used  Substance Use Topics  .  Alcohol use: No  . Drug use: No    Home Medications Prior to Admission medications   Medication Sig Start Date End Date Taking? Authorizing Provider  albuterol (PROVENTIL HFA;VENTOLIN HFA) 108 (90 Base) MCG/ACT inhaler TAKE 2 PUFFS BY MOUTH EVERY 6 HOURS AS NEEDED FOR WHEEZE OR SHORTNESS OF BREATH Patient taking differently: Inhale 2 puffs into the lungs every 6 (six) hours as needed for wheezing or shortness of breath.  08/07/17   [name], FNP  blood glucose meter kit and supplies KIT Dispense per insurance preference. Use up to four times daily . E 11.9 09/30/16   Hawks, Christy A, FNP   Camphor-Menthol-Methyl Sal (SALONPAS) 3.08-02-08 % PTCH Apply 1 patch topically every 12 (twelve) hours as needed (pain).    [provider]  diclofenac Sodium (VOLTAREN) 1 % GEL Apply 1 application topically in the morning and at bedtime. (0900 & 2100) 10/13/19   [provider]  diphenoxylate-atropine (LOMOTIL) 2.5-0.025 MG tablet Take 1 tablet by mouth 4 (four) times daily as needed for diarrhea or loose stools.    [provider]  glucose blood (GLUCOSE METER TEST) test strip Use BID 09/29/16   Evelina Dun A, FNP  hydrocortisone cream 1 % Apply 1 application topically 2 (two) times daily as needed for itching.    [provider]  LORazepam (ATIVAN) 1 MG tablet Take 0.5 mg by mouth every 12 (twelve) hours as needed for anxiety.    [provider]  Menthol, Topical Analgesic, 4 % GEL Apply 1 application topically 2 (two) times daily as needed (pain).    [provider]  [facility] powder APPLY TOPICALLY FOUR TIMES A DAY Patient taking differently: Apply 1 application topically 2 (two) times daily. (0900 & 2100) 03/04/18   Evelina Dun A, FNP  ondansetron (ZOFRAN) 8 MG tablet Take 8 mg by mouth every 8 (eight) hours as needed for nausea or vomiting.    [provider]  Ondansetron 4 MG FILM Take 4 mg by mouth in the morning and at bedtime. (0800 & 2000)    [provider]  oxyCODONE-acetaminophen (PERCOCET) 10-325 MG tablet Take 1 tablet by mouth in the morning, at noon, in the evening, and at bedtime.  07/12/14   [provider]  polyethylene glycol (MIRALAX / GLYCOLAX) 17 g packet Take 17 g by mouth daily. (0800)    [provider]  psyllium (REGULOID) 0.52 g capsule Take 0.52 g by mouth at bedtime. (2000)    [provider]  quinapril (ACCUPRIL) 40 MG tablet Take 1 tablet (40 mg total) by mouth daily. Patient taking differently: Take 40 mg by mouth daily. (0800) 11/03/17   [name], FNP  senna  (SENOKOT) 8.6 MG tablet Take 2 tablets by mouth at bedtime. (2000)    [provider]  simethicone (MYLICON) 825 MG chewable tablet Chew 125 mg by mouth every 6 (six) hours as needed for flatulence.     [provider]  tamsulosin (FLOMAX) 0.4 MG CAPS capsule Take 0.4 mg by mouth daily at 10 pm. (2100) 10/10/19   [provider]    Allergies    Iodinated diagnostic agents, Penicillins, Sulfa antibiotics, and Penicillin g  Review of Systems   Review of Systems  Constitutional: Negative for chills, diaphoresis and fever.  HENT: Negative for congestion and rhinorrhea.   Respiratory: Negative for cough and shortness of breath.   Cardiovascular: Positive for chest pain and leg swelling. Negative for palpitations and syncope.  Gastrointestinal: Positive for nausea. Negative for diarrhea and vomiting.  Genitourinary: Negative for difficulty urinating and dysuria.  Musculoskeletal: Negative for arthralgias and back pain.  Skin: Negative for rash and wound.  Neurological: Negative for light-headedness and headaches.    Physical Exam Updated Vital Signs BP (!) 155/82   Pulse 72   Temp 98.4 F (36.9 C) (Oral)   Resp 20   Ht 5' 6"  (1.676 m)   Wt 117.9 kg   SpO2 95%   BMI 41.97 kg/m   Physical Exam Vitals and nursing note reviewed. Exam conducted with a chaperone present.  Constitutional:      General: She is not in acute distress.    Appearance: Normal appearance.  HENT:     Head: Normocephalic and atraumatic.     Nose: No rhinorrhea.  Eyes:     General:        Right eye: No discharge.        Left eye: No discharge.     Conjunctiva/sclera: Conjunctivae normal.  Cardiovascular:     Rate and Rhythm: Normal rate. Rhythm irregular.  Pulmonary:     Effort: Pulmonary effort is normal. No respiratory distress.     Breath sounds: No stridor. Wheezing (diffuse and equal) present.  Abdominal:     General: Abdomen is flat. There is no distension.      Palpations: Abdomen is soft.  Musculoskeletal:        General: No signs of injury.     Right lower leg: Tenderness present. Edema present.     Left lower leg: No tenderness. Edema present.     Comments: Bilateral lower extremity edema, worse on the right, erythema and warmth of the distal leg on the right neurovascularly intact distal  Skin:    General: Skin is warm and dry.  Neurological:     General: No focal deficit present.     Mental Status: She is alert. Mental status is at baseline.     Motor: No weakness.  Psychiatric:        Mood and Affect: Mood normal.        Behavior: Behavior normal.     ED Results / Procedures / Treatments   Labs (all labs ordered are listed, but only abnormal results are displayed) Labs Reviewed  CBC WITH DIFFERENTIAL/PLATELET  BASIC METABOLIC PANEL  BRAIN NATRIURETIC PEPTIDE  D-DIMER, QUANTITATIVE  TROPONIN I (HIGH SENSITIVITY)    EKG EKG Interpretation  Date/Time:  Thursday September 20 2020 14:17:45 EST Ventricular Rate:  77 PR Interval:    QRS Duration: 91 QT Interval:  363 QTC Calculation: 411 R Axis:   -58 Text Interpretation: Atrial fibrillation Left anterior fascicular block Anterior infarct, old Confirmed by Dewaine Conger [phone]) on 09/20/2020 2:20:47 PM   Radiology DG Chest Portable 1 View  Result Date: 09/20/2020 CLINICAL DATA:  Chest pain, COPD EXAM: PORTABLE CHEST 1 VIEW COMPARISON:  10/21/2019 FINDINGS: Single frontal view of the chest demonstrates stable spinal stimulator. Cardiac silhouette is enlarged. Mild central vascular congestion without airspace disease, effusion, or pneumothorax. Subsegmental atelectasis or scarring left upper lobe. IMPRESSION: 1. Chronic central vascular congestion without overt edema. Electronically Signed   By: Randa Ngo M.D.   On: 09/20/2020 15:03   VAS [place] LOWER EXTREMITY VENOUS (DVT) (ONLY [facility] & [facility])  Result Date: 09/20/2020  Lower Venous DVT Study Indications: Swelling.  Limitations: Body  habitus. Comparison Study: Previous 2015 Negative TDS Performing Technologist: Vonzell Schlatter RVT  Examination Guidelines: A complete evaluation includes B-mode  imaging, spectral Doppler, color Doppler, and power Doppler as needed of all accessible portions of each vessel. Bilateral testing is considered an integral part of a complete examination. Limited examinations for reoccurring indications may be performed as noted. The reflux portion of the exam is performed with the patient in reverse Trendelenburg.  +---------+---------------+---------+-----------+----------+--------------+ RIGHT    CompressibilityPhasicitySpontaneityPropertiesThrombus Aging +---------+---------------+---------+-----------+----------+--------------+ CFV      Full           Yes      Yes                                 +---------+---------------+---------+-----------+----------+--------------+ SFJ      Full                                                        +---------+---------------+---------+-----------+----------+--------------+ FV Prox  Full                                                        +---------+---------------+---------+-----------+----------+--------------+ FV Mid   Full                                                        +---------+---------------+---------+-----------+----------+--------------+ FV DistalFull                                                        +---------+---------------+---------+-----------+----------+--------------+ PFV      Full                                                        +---------+---------------+---------+-----------+----------+--------------+ POP      Full           Yes      Yes                                 +---------+---------------+---------+-----------+----------+--------------+ PTV      Full                                                         +---------+---------------+---------+-----------+----------+--------------+ PERO     Full                                                        +---------+---------------+---------+-----------+----------+--------------+   +---------+---------------+---------+-----------+----------+--------------+  LEFT     CompressibilityPhasicitySpontaneityPropertiesThrombus Aging +---------+---------------+---------+-----------+----------+--------------+ CFV      Full           Yes      Yes                                 +---------+---------------+---------+-----------+----------+--------------+ SFJ      Full                                                        +---------+---------------+---------+-----------+----------+--------------+ FV Prox  Full                                                        +---------+---------------+---------+-----------+----------+--------------+ FV Mid   Full                                                        +---------+---------------+---------+-----------+----------+--------------+ FV DistalFull                                                        +---------+---------------+---------+-----------+----------+--------------+ PFV      Full                                                        +---------+---------------+---------+-----------+----------+--------------+ POP      Full           Yes      Yes                                 +---------+---------------+---------+-----------+----------+--------------+ PTV      Full                                                        +---------+---------------+---------+-----------+----------+--------------+ PERO     Full                                                        +---------+---------------+---------+-----------+----------+--------------+  Summary: BILATERAL: - No evidence of deep vein thrombosis seen in the lower extremities, bilaterally. -No evidence of  popliteal cyst, bilaterally.   *See table(s) above for measurements and observations.    Preliminary     Procedures Procedures   Medications Ordered in ED  Medications  cephALEXin (KEFLEX) capsule 500 mg (has no administration in time range)    ED Course  I have reviewed the triage vital signs and the nursing notes.  Pertinent labs & imaging results that were available during my care of the patient were reviewed by me and considered in my medical decision making (see chart for details).    MDM Rules/Calculators/A&P                          Sudden onset chest pain.  Patient was brought in by EMS.  Pain is now resolved.  Was hypoxic to the upper 80s but has history of COPD does not wear oxygen.  Currently has no symptoms.  Has had this pain in the past but is never had it evaluated.  On exam she has a regular heart rate but heart rate is controlled.  Vital signs are otherwise stable she is afebrile.  She has lower extremity swelling concerning for possible cellulitis versus DVT.  Diffuse wheezing.  However no report of shortness of breath or increased work of breathing.  DVT study negative.  Chest x-ray reviewed by myself and radiology shows chronic changes, awaiting labs and will add d dimer. Will treat LE with keflex.   Pt care was handed off to on coming provider at 1500.  Complete history and physical and current plan have been communicated.  Please refer to their note for the remainder of ED care and ultimate disposition.  Pt seen in conjunction with Dr. Roslynn Amble   Final Clinical Impression(s) / ED Diagnoses Final diagnoses:  None    Rx / DC Orders ED Discharge Orders    None       Breck Coons, MD 09/20/20 [phone]

## 2020-09-20 NOTE — Progress Notes (Signed)
Bilateral lower extremity venous study completed.      Please see CV Proc for preliminary results.   Colisha Redler, RVT  

## 2020-09-20 NOTE — ED Triage Notes (Addendum)
Pt to ED via EMS from facility; [facility] c/o chest pain; facility gave 324 aspirin prior to EMS arrival. On EMS arrival pt denies chest pain. NM:[facility], [facility] . On EMS arrival 88% RA. No medications given by EMS. Last VS: 160 PALP, 97% 3L, HR 90- IRREGULAR, RR20, Expiratory wheezing. A&O4. Leg swelling noted by EMS, Pt reports this is chronic.

## 2020-09-20 NOTE — ED Provider Notes (Signed)
Signout note  75 year old lady with history of COPD presents to ER with concern for episode of chest pain as well as swelling/redness in her right leg.  Patient currently symptom-free.  Initial vitals stable except noted borderline hypoxia but suspected baseline within goal for her COPD.  Broad work-up commenced.  DVT study was negative.  Started on cephalexin for possible cellulitis.     3:30 PM received sign out from [facility], lab work including serial troponins, dimer pending.  If work-up is reassuring and patient remains well-appearing, anticipate likely discharge home.  D-dimer was modestly elevated, patient has contrast allergy, will premedicate  Reassessed patient, she continues to have no ongoing cardiopulmonary complaints and appears well.  Noted some dysuria.  CT scan was negative for acute pulmonary embolism.  Repeat troponin within normal limits.  UA was checked and was negative for UTI.  Given her current clinical appearance and work-up today, believe she can be discharged and managed in the outpatient setting.  Recommended close follow-up with primary doctor, recommended trial of antibiotics for the area of possible cellulitis on her leg.  Reviewed return precautions with patient and family.    Lucrezia Starch, MD 09/20/20 [phone]

## 2020-09-20 NOTE — ED Notes (Signed)
Pt transported to vascular.  °

## 2020-09-20 NOTE — ED Notes (Signed)
Per provider okay for patients 02 level to be 88% or higher

## 2020-09-21 LAB — RESP PANEL BY RT-PCR (FLU A&B, COVID) ARPGX2
Influenza A by PCR: NEGATIVE
Influenza B by PCR: NEGATIVE
SARS Coronavirus 2 by RT PCR: NEGATIVE

## 2020-12-25 ENCOUNTER — Emergency Department (HOSPITAL_COMMUNITY)
Admission: EM | Admit: 2020-12-25 | Discharge: 2020-12-25 | Disposition: A | Discharge status: Home / Self Care | Payer: Medicare Other | Attending: Emergency Medicine | Admitting: Emergency Medicine

## 2020-12-25 ENCOUNTER — Other Ambulatory Visit: Payer: Self-pay

## 2020-12-25 ENCOUNTER — Encounter (HOSPITAL_COMMUNITY): Payer: Self-pay

## 2020-12-25 ENCOUNTER — Emergency Department (HOSPITAL_COMMUNITY): Payer: Medicare Other

## 2020-12-25 DIAGNOSIS — J449 Chronic obstructive pulmonary disease, unspecified: Secondary | ICD-10-CM | POA: Diagnosis not present

## 2020-12-25 DIAGNOSIS — R0602 Shortness of breath: Secondary | ICD-10-CM

## 2020-12-25 DIAGNOSIS — E785 Hyperlipidemia, unspecified: Secondary | ICD-10-CM | POA: Diagnosis not present

## 2020-12-25 DIAGNOSIS — U071 COVID-19: Secondary | ICD-10-CM | POA: Insufficient documentation

## 2020-12-25 DIAGNOSIS — E1169 Type 2 diabetes mellitus with other specified complication: Secondary | ICD-10-CM | POA: Diagnosis not present

## 2020-12-25 DIAGNOSIS — Z96653 Presence of artificial knee joint, bilateral: Secondary | ICD-10-CM | POA: Diagnosis not present

## 2020-12-25 DIAGNOSIS — I2510 Atherosclerotic heart disease of native coronary artery without angina pectoris: Secondary | ICD-10-CM | POA: Diagnosis not present

## 2020-12-25 DIAGNOSIS — E039 Hypothyroidism, unspecified: Secondary | ICD-10-CM | POA: Diagnosis not present

## 2020-12-25 MED ORDER — DOXYCYCLINE HYCLATE 100 MG PO CAPS: 100.0000 mg | ORAL_CAPSULE | Freq: Two times a day (BID) | ORAL | 0 refills | Status: AC

## 2020-12-25 MED ORDER — PREDNISONE 10 MG PO TABS: 60.0000 mg | ORAL_TABLET | Freq: Every day | ORAL | 0 refills | Status: AC

## 2020-12-25 MED ORDER — PREDNISONE 20 MG PO TABS
60.0000 mg | ORAL_TABLET | Freq: Once | ORAL | Status: AC
  Administered 2020-12-25: 60 mg via ORAL
  Filled 2020-12-25: qty 3

## 2020-12-25 MED ORDER — DOXYCYCLINE HYCLATE 100 MG PO TABS
100.0000 mg | ORAL_TABLET | Freq: Once | ORAL | Status: AC
  Administered 2020-12-25: 100 mg via ORAL
  Filled 2020-12-25: qty 1

## 2020-12-25 NOTE — ED Notes (Signed)
Pt's ride is here and pt was wheeled out to meet her

## 2020-12-25 NOTE — ED Notes (Signed)
Patient states she can not walk. However when RN went into room she was dressed and sitting in the chair. She pulled everything off but when I placed the pulse ox it. 89-90%. Patient will not allow RN to place her back on monitor and BP cuff. She is sitting with her door open in a wheelchair yelling out that she wants to leave. Dr.Trifan contacted pt's granddaughter Chrissy and she is on her way to pick pt up and return her to [place]. Patient will be left in room until her ride is her.

## 2020-12-25 NOTE — ED Triage Notes (Signed)
Patient BIB [facility] for SOB and Covid + on 5/19. Patient c/o non-productive cough, SOB with exertion and congestion

## 2020-12-25 NOTE — ED Provider Notes (Signed)
[facility] DEPT Provider Note   CSN: [id number] Arrival date & time: 12/25/20  1752     History Chief Complaint  Patient presents with  . Shortness of Breath    Shirley Leblanc is a 75 y.o. female w/ COPD not on home oxygen presenting to ED with cough and SOB.  Reports testing positive for covid on 12/13/20.  She has now had 14 days of symptoms.  She continues having chest congestion and dyspnea with exertion.  Chronic cough.  No chest pain.  Lives at [facility].  HPI     Past Medical History:  Diagnosis Date  . Bursitis   . CAD (coronary artery disease)    a. nonobstructive CAD with 40% Prox LAD stenosis by cath in 2007  . COPD (chronic obstructive pulmonary disease) ([place])   . Depression   . DJD (degenerative joint disease)   . GERD (gastroesophageal reflux disease)   . Hyperlipemia   . Hypertension   . Left wrist fracture   . Obesities, morbid ([place])   . Open left ankle fracture   . Syncope 11/2017  . Urinary incontinence     Patient Active Problem List   Diagnosis Date Noted  . Dizziness   . Tremor 09/18/2017  . Encephalopathy   . Tremors of nervous system   . Near syncope 09/16/2017  . Syncope 09/15/2017  . CAD (coronary artery disease) 09/15/2017  . At [facility] for fall 05/15/2017  . Chest pain 04/28/2017  . Osteopenia 02/02/2017  . Morbid obesity ([place]) 11/13/2016  . Elevated troponin 11/13/2016  . Chronic back pain 10/10/2016  . Pain medication agreement signed 10/10/2016  . Opioid dependence ([place]) 10/10/2016  . Diabetes ([place]) 09/19/2016  . Depression 04/18/2016  . Hypothyroid 02/22/2016  . Hyperlipidemia associated with type 2 diabetes mellitus ([place]) 02/22/2016  . Chronic anticoagulation - Eliquis, CHADS2VASC=3 06/05/2015  . Atrial fibrillation ([place]) 05/16/2015  . S/P laparoscopic cholecystectomy   . Hypertension associated with diabetes ([place]) 05/09/2015  . Dyspnea on exertion 03/14/2011    Past  Surgical History:  Procedure Laterality Date  . CARDIAC CATHETERIZATION    . CHOLECYSTECTOMY N/A 05/14/2015   Procedure: LAPAROSCOPIC CHOLECYSTECTOMY;  Surgeon: Coralie Keens, MD;  Location: [facility];  Service: General;  Laterality: N/A;  . ESOPHAGOGASTRODUODENOSCOPY (EGD) WITH PROPOFOL N/A 11/11/2019   Procedure: ESOPHAGOGASTRODUODENOSCOPY (EGD) WITH PROPOFOL;  Surgeon: Carol Ada, MD;  Location: [facility];  Service: Endoscopy;  Laterality: N/A;  . NECK SURGERY    . SAVORY DILATION N/A 11/11/2019   Procedure: SAVORY DILATION;  Surgeon: Carol Ada, MD;  Location: [facility];  Service: Endoscopy;  Laterality: N/A;  . TOTAL KNEE ARTHROPLASTY     x2  . VAGINAL HYSTERECTOMY       OB History   No obstetric history on file.     Family History  Problem Relation Age of Onset  . Lung disease Mother   . Heart attack Father   . Lung disease Other   . Heart attack Other   . Cancer Daughter        breast    Social History   Tobacco Use  . Smoking status: Never Smoker  . Smokeless tobacco: Never Used  Vaping Use  . Vaping Use: Never used  Substance Use Topics  . Alcohol use: Yes    Comment: Occasionally   . Drug use: No    Home Medications Prior to Admission medications   Medication Sig Start Date End Date Taking? Authorizing  Provider  doxycycline (VIBRAMYCIN) 100 MG capsule Take 1 capsule (100 mg total) by mouth 2 (two) times daily for 7 days. 12/25/20 01/01/21 Yes Reggie Bise, Carola Rhine, MD  predniSONE (DELTASONE) 10 MG tablet Take 6 tablets (60 mg total) by mouth daily for 5 days. 12/26/20 12/31/20 Yes Antuan Limes, Carola Rhine, MD  albuterol (PROVENTIL HFA;VENTOLIN HFA) 108 (90 Base) MCG/ACT inhaler TAKE 2 PUFFS BY MOUTH EVERY 6 HOURS AS NEEDED FOR WHEEZE OR SHORTNESS OF BREATH Patient taking differently: Inhale 2 puffs into the lungs every 6 (six) hours as needed for wheezing or shortness of breath.  08/07/17   [name], FNP  blood glucose meter kit and supplies KIT Dispense per  insurance preference. Use up to four times daily . E 11.9 09/30/16   Hawks, Christy A, FNP  Camphor-Menthol-Methyl Sal (SALONPAS) 3.08-02-08 % PTCH Apply 1 patch topically every 12 (twelve) hours as needed (pain).    [provider]  diclofenac Sodium (VOLTAREN) 1 % GEL Apply 1 application topically in the morning and at bedtime. (0900 & 2100) 10/13/19   [provider]  diphenoxylate-atropine (LOMOTIL) 2.5-0.025 MG tablet Take 1 tablet by mouth 4 (four) times daily as needed for diarrhea or loose stools.    [provider]  glucose blood (GLUCOSE METER TEST) test strip Use BID 09/29/16   Evelina Dun A, FNP  hydrocortisone cream 1 % Apply 1 application topically 2 (two) times daily as needed for itching.    [provider]  LORazepam (ATIVAN) 1 MG tablet Take 0.5 mg by mouth every 12 (twelve) hours as needed for anxiety.    [provider]  Menthol, Topical Analgesic, 4 % GEL Apply 1 application topically 2 (two) times daily as needed (pain).    [provider]  [facility] powder APPLY TOPICALLY FOUR TIMES A DAY Patient taking differently: Apply 1 application topically 2 (two) times daily. (0900 & 2100) 03/04/18   Evelina Dun A, FNP  ondansetron (ZOFRAN) 8 MG tablet Take 8 mg by mouth every 8 (eight) hours as needed for nausea or vomiting.    [provider]  Ondansetron 4 MG FILM Take 4 mg by mouth in the morning and at bedtime. (0800 & 2000)    [provider]  oxyCODONE-acetaminophen (PERCOCET) 10-325 MG tablet Take 1 tablet by mouth in the morning, at noon, in the evening, and at bedtime.  07/12/14   [provider]  polyethylene glycol (MIRALAX / GLYCOLAX) 17 g packet Take 17 g by mouth daily. (0800)    [provider]  psyllium (REGULOID) 0.52 g capsule Take 0.52 g by mouth at bedtime. (2000)    [provider]  quinapril (ACCUPRIL) 40 MG tablet Take 1 tablet (40 mg total) by mouth daily. Patient taking  differently: Take 40 mg by mouth daily. (0800) 11/03/17   [name], FNP  senna (SENOKOT) 8.6 MG tablet Take 2 tablets by mouth at bedtime. (2000)    [provider]  simethicone (MYLICON) 237 MG chewable tablet Chew 125 mg by mouth every 6 (six) hours as needed for flatulence.     [provider]  tamsulosin (FLOMAX) 0.4 MG CAPS capsule Take 0.4 mg by mouth daily at 10 pm. (2100) 10/10/19   [provider]    Allergies    Iodinated diagnostic agents, Penicillins, Sulfa antibiotics, and Penicillin g  Review of Systems   Review of Systems  Constitutional: Negative for chills and fever.  HENT: Negative for ear pain and  sore throat.   Eyes: Negative for pain and visual disturbance.  Respiratory: Positive for cough and shortness of breath.   Cardiovascular: Negative for chest pain and palpitations.  Gastrointestinal: Negative for abdominal pain and vomiting.  Genitourinary: Negative for dysuria and hematuria.  Musculoskeletal: Negative for arthralgias and back pain.  Skin: Negative for color change and rash.  Neurological: Negative for syncope and headaches.  All other systems reviewed and are negative.   Physical Exam Updated Vital Signs BP 136/90   Pulse 82   Temp 97.9 F (36.6 C) (Oral)   Resp 20   Ht $R'5\' 6"'Je$  (1.676 m)   Wt 129.3 kg   SpO2 93%   BMI 46.00 kg/m   Physical Exam Constitutional:      General: She is not in acute distress.    Appearance: She is obese.  HENT:     Head: Normocephalic and atraumatic.  Eyes:     Conjunctiva/sclera: Conjunctivae normal.     Pupils: Pupils are equal, round, and reactive to light.  Cardiovascular:     Rate and Rhythm: Normal rate and regular rhythm.  Pulmonary:     Effort: Pulmonary effort is normal. No respiratory distress.     Comments: 90-95% on room air  Diminished breath sounds llower lung fields Coarse cough Abdominal:     General: There is no distension.     Tenderness: There is no  abdominal tenderness.  Skin:    General: Skin is warm and dry.  Neurological:     General: No focal deficit present.     Mental Status: She is alert. Mental status is at baseline.  Psychiatric:        Mood and Affect: Mood normal.        Behavior: Behavior normal.     ED Results / Procedures / Treatments   Labs (all labs ordered are listed, but only abnormal results are displayed) Labs Reviewed - No data to display  EKG None  Radiology DG Chest Portable 1 View  Result Date: 12/25/2020 CLINICAL DATA:  Shortness of breath, recent COVID EXAM: PORTABLE CHEST 1 VIEW COMPARISON:  09/20/2020 FINDINGS: Interstitial prominence and peribronchial thickening. Low lung volumes. Focal right lower lobe atelectasis or infiltrate. Heart is borderline enlarged. Mild vascular congestion. No effusions or pneumothorax. IMPRESSION: Peribronchial thickening and interstitial prominence may reflect bronchitic changes or atypical infection. Right lower lobe atelectasis or infiltrate. Cardiomegaly, vascular congestion. Electronically Signed   By: Rolm Baptise M.D.   On: 12/25/2020 20:56    Procedures Procedures   Medications Ordered in ED Medications  predniSONE (DELTASONE) tablet 60 mg (60 mg Oral Given 12/25/20 2218)  doxycycline (VIBRA-TABS) tablet 100 mg (100 mg Oral Given 12/25/20 2216)    ED Course  I have reviewed the triage vital signs and the nursing notes.  Pertinent labs & imaging results that were available during my care of the patient were reviewed by me and considered in my medical decision making (see chart for details).  75 yo female here with covid-illness x 14 days, shortness of breath and cough She reports she is not vaccinated for Covid  Dg review - likely viral PNA pattern but cannot exclude consolidation for bacterial PNA in RLL.  I think it's reasonable to treat with 7 days of doxycycline We can try a course of steroids - likely some component of hypercapnea and chronic COPD  related to her obesity.  No significant wheezing.    At this time she is not hypoxic requiring oxygen  and hospitalization.  I discussed with her family member Engineer, manufacturing systems) and the patient that she may continue to decline in health and require oxygen in the nearby future, but there is nothing additional to be offered in hospitalization at this point.  She has constant monitoring at her nursing facility.  We'll discharge back with prescriptions and return precautions.  The patient is wanting to go home, and understands the return precautions.   Clinical Course as of 12/25/20 2300  Tue Dec 25, 2020  2236 I updated patient's granddaughter Olive Bass who will come pick up patient and take her back to [facility]. [MT]    Clinical Course User Index [MT] Ayona Yniguez, Carola Rhine, MD    Final Clinical Impression(s) / ED Diagnoses Final diagnoses:  COVID-19  Shortness of breath    Rx / DC Orders ED Discharge Orders         Ordered    predniSONE (DELTASONE) 10 MG tablet  Daily        12/25/20 2233    doxycycline (VIBRAMYCIN) 100 MG capsule  2 times daily        12/25/20 2233           Wyvonnia Dusky, MD 12/25/20 2300

## 2020-12-25 NOTE — Discharge Instructions (Signed)
Shirley Leblanc's oxygen level was roughly 90-94% on our monitors in the ER today.  Her xray shows some likely viral pneumonia.  This is expected in the setting of her covid illness.  I started her on 7 days of an antibiotic called doxycycline, because there was a questionable small bacterial pneumonia on her xray film.  I also started her on prednisone for 5 days for her COPD - she does not have bad wheezing, but this may help in the coming days.  I advised she continue using her inhaler every 4 hours, 2 puffs per day.  Most importantly, I explained that Covid can cause long-term breathing problems.  If her oxygen levels drops below 85% at rest, or she feels she is having significant difficulty breathing, she needs to come back to the ER.  Please contact her doctor to come evaluate her.  She may benefit from having a supplemental oxygen tank available to use at home.

## 2021-04-02 ENCOUNTER — Inpatient Hospital Stay (HOSPITAL_COMMUNITY): Payer: Medicare Other

## 2021-04-02 ENCOUNTER — Emergency Department (HOSPITAL_COMMUNITY): Payer: Medicare Other

## 2021-04-02 ENCOUNTER — Encounter (HOSPITAL_COMMUNITY): Payer: Self-pay | Admitting: Internal Medicine

## 2021-04-02 ENCOUNTER — Other Ambulatory Visit: Payer: Self-pay

## 2021-04-02 ENCOUNTER — Inpatient Hospital Stay (HOSPITAL_COMMUNITY)
Admission: EM | Admit: 2021-04-02 | Discharge: 2021-04-08 | DRG: 291 | Disposition: A | Discharge status: Skilled Nursing Facility | Payer: Medicare Other | Attending: Internal Medicine | Admitting: Internal Medicine

## 2021-04-02 DIAGNOSIS — J9621 Acute and chronic respiratory failure with hypoxia: Secondary | ICD-10-CM | POA: Diagnosis present

## 2021-04-02 DIAGNOSIS — I152 Hypertension secondary to endocrine disorders: Secondary | ICD-10-CM | POA: Diagnosis present

## 2021-04-02 DIAGNOSIS — I5031 Acute diastolic (congestive) heart failure: Secondary | ICD-10-CM | POA: Diagnosis not present

## 2021-04-02 DIAGNOSIS — E785 Hyperlipidemia, unspecified: Secondary | ICD-10-CM | POA: Diagnosis present

## 2021-04-02 DIAGNOSIS — I110 Hypertensive heart disease with heart failure: Secondary | ICD-10-CM | POA: Diagnosis present

## 2021-04-02 DIAGNOSIS — E1159 Type 2 diabetes mellitus with other circulatory complications: Secondary | ICD-10-CM | POA: Diagnosis not present

## 2021-04-02 DIAGNOSIS — E1169 Type 2 diabetes mellitus with other specified complication: Secondary | ICD-10-CM | POA: Diagnosis present

## 2021-04-02 DIAGNOSIS — I4891 Unspecified atrial fibrillation: Secondary | ICD-10-CM | POA: Diagnosis present

## 2021-04-02 DIAGNOSIS — Z79899 Other long term (current) drug therapy: Secondary | ICD-10-CM

## 2021-04-02 DIAGNOSIS — Z981 Arthrodesis status: Secondary | ICD-10-CM | POA: Diagnosis not present

## 2021-04-02 DIAGNOSIS — K219 Gastro-esophageal reflux disease without esophagitis: Secondary | ICD-10-CM | POA: Diagnosis present

## 2021-04-02 DIAGNOSIS — Z79891 Long term (current) use of opiate analgesic: Secondary | ICD-10-CM

## 2021-04-02 DIAGNOSIS — I248 Other forms of acute ischemic heart disease: Secondary | ICD-10-CM | POA: Diagnosis present

## 2021-04-02 DIAGNOSIS — G8929 Other chronic pain: Secondary | ICD-10-CM | POA: Diagnosis present

## 2021-04-02 DIAGNOSIS — J449 Chronic obstructive pulmonary disease, unspecified: Secondary | ICD-10-CM | POA: Diagnosis present

## 2021-04-02 DIAGNOSIS — Z882 Allergy status to sulfonamides status: Secondary | ICD-10-CM

## 2021-04-02 DIAGNOSIS — Z79890 Hormone replacement therapy (postmenopausal): Secondary | ICD-10-CM | POA: Diagnosis not present

## 2021-04-02 DIAGNOSIS — E039 Hypothyroidism, unspecified: Secondary | ICD-10-CM | POA: Diagnosis present

## 2021-04-02 DIAGNOSIS — M8580 Other specified disorders of bone density and structure, unspecified site: Secondary | ICD-10-CM | POA: Diagnosis present

## 2021-04-02 DIAGNOSIS — I2510 Atherosclerotic heart disease of native coronary artery without angina pectoris: Secondary | ICD-10-CM | POA: Diagnosis present

## 2021-04-02 DIAGNOSIS — Z880 Allergy status to penicillin: Secondary | ICD-10-CM

## 2021-04-02 DIAGNOSIS — I509 Heart failure, unspecified: Secondary | ICD-10-CM

## 2021-04-02 DIAGNOSIS — R0602 Shortness of breath: Secondary | ICD-10-CM | POA: Diagnosis present

## 2021-04-02 DIAGNOSIS — Z96659 Presence of unspecified artificial knee joint: Secondary | ICD-10-CM | POA: Diagnosis present

## 2021-04-02 DIAGNOSIS — Z8249 Family history of ischemic heart disease and other diseases of the circulatory system: Secondary | ICD-10-CM

## 2021-04-02 DIAGNOSIS — Z91041 Radiographic dye allergy status: Secondary | ICD-10-CM

## 2021-04-02 DIAGNOSIS — M545 Low back pain, unspecified: Secondary | ICD-10-CM | POA: Diagnosis not present

## 2021-04-02 DIAGNOSIS — M1990 Unspecified osteoarthritis, unspecified site: Secondary | ICD-10-CM | POA: Diagnosis present

## 2021-04-02 DIAGNOSIS — I5033 Acute on chronic diastolic (congestive) heart failure: Secondary | ICD-10-CM | POA: Diagnosis present

## 2021-04-02 DIAGNOSIS — I872 Venous insufficiency (chronic) (peripheral): Secondary | ICD-10-CM | POA: Diagnosis present

## 2021-04-02 DIAGNOSIS — I480 Paroxysmal atrial fibrillation: Secondary | ICD-10-CM | POA: Diagnosis not present

## 2021-04-02 DIAGNOSIS — Z66 Do not resuscitate: Secondary | ICD-10-CM | POA: Diagnosis present

## 2021-04-02 DIAGNOSIS — Z803 Family history of malignant neoplasm of breast: Secondary | ICD-10-CM | POA: Diagnosis not present

## 2021-04-02 DIAGNOSIS — F39 Unspecified mood [affective] disorder: Secondary | ICD-10-CM | POA: Diagnosis present

## 2021-04-02 DIAGNOSIS — R0902 Hypoxemia: Secondary | ICD-10-CM

## 2021-04-02 DIAGNOSIS — Z8616 Personal history of COVID-19: Secondary | ICD-10-CM

## 2021-04-02 DIAGNOSIS — M549 Dorsalgia, unspecified: Secondary | ICD-10-CM | POA: Diagnosis present

## 2021-04-02 DIAGNOSIS — M25512 Pain in left shoulder: Secondary | ICD-10-CM | POA: Diagnosis not present

## 2021-04-02 LAB — GLUCOSE, CAPILLARY: Glucose-Capillary: 165 mg/dL — ABNORMAL HIGH (ref 70–99)

## 2021-04-02 LAB — CBC WITH DIFFERENTIAL/PLATELET
Abs Immature Granulocytes: 0.01 10*3/uL (ref 0.00–0.07)
Basophils Absolute: 0 10*3/uL (ref 0.0–0.1)
Basophils Relative: 0 %
Eosinophils Absolute: 0.1 10*3/uL (ref 0.0–0.5)
Eosinophils Relative: 3 %
HCT: 37.4 % (ref 36.0–46.0)
Hemoglobin: 11.2 g/dL — ABNORMAL LOW (ref 12.0–15.0)
Immature Granulocytes: 0 %
Lymphocytes Relative: 18 %
Lymphs Abs: 1 10*3/uL (ref 0.7–4.0)
MCH: 28.2 pg (ref 26.0–34.0)
MCHC: 29.9 g/dL — ABNORMAL LOW (ref 30.0–36.0)
MCV: 94.2 fL (ref 80.0–100.0)
Monocytes Absolute: 0.5 10*3/uL (ref 0.1–1.0)
Monocytes Relative: 8 %
Neutro Abs: 4 10*3/uL (ref 1.7–7.7)
Neutrophils Relative %: 71 %
Platelets: 118 10*3/uL — ABNORMAL LOW (ref 150–400)
RBC: 3.97 MIL/uL (ref 3.87–5.11)
RDW: 16 % — ABNORMAL HIGH (ref 11.5–15.5)
WBC: 5.7 10*3/uL (ref 4.0–10.5)
nRBC: 0 % (ref 0.0–0.2)

## 2021-04-02 LAB — TROPONIN I (HIGH SENSITIVITY)
Troponin I (High Sensitivity): 19 ng/L — ABNORMAL HIGH (ref <18)
Troponin I (High Sensitivity): 19 ng/L — ABNORMAL HIGH (ref <18)

## 2021-04-02 LAB — SARS CORONAVIRUS 2 (TAT 6-24 HRS): SARS Coronavirus 2: NEGATIVE

## 2021-04-02 LAB — COMPREHENSIVE METABOLIC PANEL
ALT: 11 U/L (ref 0–44)
AST: 18 U/L (ref 15–41)
Albumin: 3.4 g/dL — ABNORMAL LOW (ref 3.5–5.0)
Alkaline Phosphatase: 83 U/L (ref 38–126)
Anion gap: 7 (ref 5–15)
BUN: 17 mg/dL (ref 8–23)
CO2: 30 mmol/L (ref 22–32)
Calcium: 9.2 mg/dL (ref 8.9–10.3)
Chloride: 100 mmol/L (ref 98–111)
Creatinine, Ser: 0.86 mg/dL (ref 0.44–1.00)
GFR, Estimated: >60 mL/min (ref >60)
Glucose, Bld: 106 mg/dL — ABNORMAL HIGH (ref 70–99)
Potassium: 4.4 mmol/L (ref 3.5–5.1)
Sodium: 137 mmol/L (ref 135–145)
Total Bilirubin: 0.8 mg/dL (ref 0.3–1.2)
Total Protein: 6.4 g/dL — ABNORMAL LOW (ref 6.5–8.1)

## 2021-04-02 LAB — BRAIN NATRIURETIC PEPTIDE: B Natriuretic Peptide: 236.4 pg/mL — ABNORMAL HIGH (ref 0.0–100.0)

## 2021-04-02 LAB — ECHOCARDIOGRAM COMPLETE: S' Lateral: 3.7 cm

## 2021-04-02 LAB — HEMOGLOBIN A1C
Hgb A1c MFr Bld: 6.2 % — ABNORMAL HIGH (ref 4.8–5.6)
Mean Plasma Glucose: 131.24 mg/dL

## 2021-04-02 LAB — CBG MONITORING, ED
Glucose-Capillary: 106 mg/dL — ABNORMAL HIGH (ref 70–99)
Glucose-Capillary: 185 mg/dL — ABNORMAL HIGH (ref 70–99)

## 2021-04-02 MED ORDER — HYDRALAZINE HCL 20 MG/ML IJ SOLN: 5.0000 mg | INTRAMUSCULAR | Status: DC | PRN

## 2021-04-02 MED ORDER — POLYETHYLENE GLYCOL 3350 17 G PO PACK
17.0000 g | PACK | Freq: Every day | ORAL | Status: DC
  Administered 2021-04-03 – 2021-04-04 (×2): 17 g via ORAL
  Filled 2021-04-02 (×2): qty 1

## 2021-04-02 MED ORDER — INSULIN ASPART 100 UNIT/ML IJ SOLN
0.0000 [IU] | Freq: Three times a day (TID) | INTRAMUSCULAR | Status: DC
  Administered 2021-04-02: 3 [IU] via SUBCUTANEOUS
  Administered 2021-04-03 – 2021-04-08 (×7): 2 [IU] via SUBCUTANEOUS

## 2021-04-02 MED ORDER — PREDNISONE 20 MG PO TABS
40.0000 mg | ORAL_TABLET | Freq: Every day | ORAL | Status: DC
Start: 2021-04-03 — End: 2021-04-02

## 2021-04-02 MED ORDER — FUROSEMIDE 10 MG/ML IJ SOLN
40.0000 mg | Freq: Once | INTRAMUSCULAR | Status: AC
  Administered 2021-04-02: 40 mg via INTRAVENOUS
  Filled 2021-04-02: qty 4

## 2021-04-02 MED ORDER — LORAZEPAM 0.5 MG PO TABS
0.5000 mg | ORAL_TABLET | Freq: Two times a day (BID) | ORAL | Status: DC
  Administered 2021-04-02 – 2021-04-08 (×12): 0.5 mg via ORAL
  Filled 2021-04-02 (×13): qty 1

## 2021-04-02 MED ORDER — TRIAMCINOLONE 0.1 % CREAM:EUCERIN CREAM 1:1
TOPICAL_CREAM | Freq: Two times a day (BID) | CUTANEOUS | Status: DC
  Administered 2021-04-05 – 2021-04-07 (×3): 1 via TOPICAL
  Filled 2021-04-02: qty 1

## 2021-04-02 MED ORDER — PSYLLIUM 0.52 G PO CAPS: 0.5200 g | ORAL_CAPSULE | Freq: Every day | ORAL | Status: DC

## 2021-04-02 MED ORDER — OXYCODONE-ACETAMINOPHEN 5-325 MG PO TABS
2.0000 | ORAL_TABLET | Freq: Once | ORAL | Status: AC
  Administered 2021-04-02: 2 via ORAL
  Filled 2021-04-02: qty 2

## 2021-04-02 MED ORDER — ACETAMINOPHEN 650 MG RE SUPP: 650.0000 mg | Freq: Four times a day (QID) | RECTAL | Status: DC | PRN

## 2021-04-02 MED ORDER — METHYLPREDNISOLONE SODIUM SUCC 125 MG IJ SOLR
60.0000 mg | Freq: Two times a day (BID) | INTRAMUSCULAR | Status: DC
Start: 2021-04-02 — End: 2021-04-02
  Administered 2021-04-02: 60 mg via INTRAVENOUS
  Filled 2021-04-02: qty 2

## 2021-04-02 MED ORDER — IPRATROPIUM-ALBUTEROL 0.5-2.5 (3) MG/3ML IN SOLN: 3.0000 mL | Freq: Four times a day (QID) | RESPIRATORY_TRACT | Status: DC

## 2021-04-02 MED ORDER — IPRATROPIUM-ALBUTEROL 0.5-2.5 (3) MG/3ML IN SOLN
3.0000 mL | Freq: Once | RESPIRATORY_TRACT | Status: AC
  Administered 2021-04-02: 3 mL via RESPIRATORY_TRACT
  Filled 2021-04-02: qty 3

## 2021-04-02 MED ORDER — ATORVASTATIN CALCIUM 10 MG PO TABS
10.0000 mg | ORAL_TABLET | Freq: Every day | ORAL | Status: DC
  Administered 2021-04-02 – 2021-04-08 (×7): 10 mg via ORAL
  Filled 2021-04-02 (×7): qty 1

## 2021-04-02 MED ORDER — PANTOPRAZOLE SODIUM 40 MG PO TBEC
40.0000 mg | DELAYED_RELEASE_TABLET | Freq: Every evening | ORAL | Status: DC
  Administered 2021-04-02 – 2021-04-07 (×6): 40 mg via ORAL
  Filled 2021-04-02 (×6): qty 1

## 2021-04-02 MED ORDER — TAMSULOSIN HCL 0.4 MG PO CAPS
0.4000 mg | ORAL_CAPSULE | Freq: Every day | ORAL | Status: DC
  Administered 2021-04-02 – 2021-04-07 (×6): 0.4 mg via ORAL
  Filled 2021-04-02 (×6): qty 1

## 2021-04-02 MED ORDER — LEVOTHYROXINE SODIUM 75 MCG PO TABS
75.0000 ug | ORAL_TABLET | Freq: Every day | ORAL | Status: DC
  Administered 2021-04-03 – 2021-04-08 (×6): 75 ug via ORAL
  Filled 2021-04-02 (×6): qty 1

## 2021-04-02 MED ORDER — LISINOPRIL 40 MG PO TABS
40.0000 mg | ORAL_TABLET | Freq: Every day | ORAL | Status: DC
  Administered 2021-04-02 – 2021-04-08 (×7): 40 mg via ORAL
  Filled 2021-04-02: qty 2
  Filled 2021-04-02 (×6): qty 1

## 2021-04-02 MED ORDER — ONDANSETRON HCL 4 MG PO TABS
4.0000 mg | ORAL_TABLET | Freq: Four times a day (QID) | ORAL | Status: DC | PRN
  Administered 2021-04-05 – 2021-04-06 (×2): 4 mg via ORAL
  Filled 2021-04-02 (×2): qty 1

## 2021-04-02 MED ORDER — OXYCODONE HCL 5 MG PO TABS
10.0000 mg | ORAL_TABLET | Freq: Three times a day (TID) | ORAL | Status: DC
  Administered 2021-04-02 – 2021-04-08 (×19): 10 mg via ORAL
  Filled 2021-04-02 (×19): qty 2

## 2021-04-02 MED ORDER — DOCUSATE SODIUM 100 MG PO CAPS
100.0000 mg | ORAL_CAPSULE | Freq: Two times a day (BID) | ORAL | Status: DC
  Administered 2021-04-02 – 2021-04-08 (×9): 100 mg via ORAL
  Filled 2021-04-02 (×12): qty 1

## 2021-04-02 MED ORDER — ALBUTEROL SULFATE (2.5 MG/3ML) 0.083% IN NEBU: 2.5000 mg | INHALATION_SOLUTION | RESPIRATORY_TRACT | Status: DC | PRN

## 2021-04-02 MED ORDER — SODIUM CHLORIDE 0.9 % IV SOLN: INTRAVENOUS | Status: DC

## 2021-04-02 MED ORDER — BUSPIRONE HCL 10 MG PO TABS
20.0000 mg | ORAL_TABLET | Freq: Three times a day (TID) | ORAL | Status: DC
  Administered 2021-04-02 – 2021-04-08 (×19): 20 mg via ORAL
  Filled 2021-04-02 (×19): qty 2

## 2021-04-02 MED ORDER — SENNA 8.6 MG PO TABS
2.0000 | ORAL_TABLET | Freq: Every day | ORAL | Status: DC
  Administered 2021-04-02 – 2021-04-03 (×2): 17.2 mg via ORAL
  Filled 2021-04-02 (×4): qty 2

## 2021-04-02 MED ORDER — ENOXAPARIN SODIUM 40 MG/0.4ML IJ SOSY
40.0000 mg | PREFILLED_SYRINGE | INTRAMUSCULAR | Status: DC
  Administered 2021-04-02 – 2021-04-03 (×2): 40 mg via SUBCUTANEOUS
  Filled 2021-04-02 (×2): qty 0.4

## 2021-04-02 MED ORDER — NICOTINE 14 MG/24HR TD PT24
14.0000 mg | MEDICATED_PATCH | Freq: Every day | TRANSDERMAL | Status: DC
  Administered 2021-04-04: 14 mg via TRANSDERMAL
  Filled 2021-04-02 (×6): qty 1

## 2021-04-02 MED ORDER — OXYCODONE-ACETAMINOPHEN 10-325 MG PO TABS
1.0000 | ORAL_TABLET | Freq: Three times a day (TID) | ORAL | Status: DC
Start: 2021-04-02 — End: 2021-04-02

## 2021-04-02 MED ORDER — ASPIRIN EC 81 MG PO TBEC
81.0000 mg | DELAYED_RELEASE_TABLET | Freq: Every day | ORAL | Status: DC
  Administered 2021-04-02 – 2021-04-08 (×7): 81 mg via ORAL
  Filled 2021-04-02 (×7): qty 1

## 2021-04-02 MED ORDER — CARVEDILOL 3.125 MG PO TABS
3.1250 mg | ORAL_TABLET | Freq: Two times a day (BID) | ORAL | Status: DC
  Administered 2021-04-02 – 2021-04-08 (×11): 3.125 mg via ORAL
  Filled 2021-04-02 (×11): qty 1

## 2021-04-02 MED ORDER — SERTRALINE HCL 100 MG PO TABS
200.0000 mg | ORAL_TABLET | Freq: Every day | ORAL | Status: DC
  Administered 2021-04-02 – 2021-04-08 (×7): 200 mg via ORAL
  Filled 2021-04-02 (×7): qty 2

## 2021-04-02 MED ORDER — PSYLLIUM 95 % PO PACK
1.0000 | PACK | Freq: Every day | ORAL | Status: DC
  Administered 2021-04-02 – 2021-04-04 (×3): 1 via ORAL
  Filled 2021-04-02 (×4): qty 1

## 2021-04-02 MED ORDER — ONDANSETRON HCL 4 MG/2ML IJ SOLN
4.0000 mg | Freq: Four times a day (QID) | INTRAMUSCULAR | Status: DC | PRN
  Administered 2021-04-07: 4 mg via INTRAVENOUS
  Filled 2021-04-02: qty 2

## 2021-04-02 MED ORDER — METHYLPREDNISOLONE SODIUM SUCC 125 MG IJ SOLR
125.0000 mg | Freq: Once | INTRAMUSCULAR | Status: AC
  Administered 2021-04-02: 125 mg via INTRAVENOUS
  Filled 2021-04-02: qty 2

## 2021-04-02 MED ORDER — FUROSEMIDE 10 MG/ML IJ SOLN
40.0000 mg | Freq: Two times a day (BID) | INTRAMUSCULAR | Status: DC
  Administered 2021-04-02 – 2021-04-06 (×8): 40 mg via INTRAVENOUS
  Filled 2021-04-02 (×8): qty 4

## 2021-04-02 MED ORDER — ACETAMINOPHEN 325 MG PO TABS
650.0000 mg | ORAL_TABLET | Freq: Four times a day (QID) | ORAL | Status: DC | PRN
  Administered 2021-04-04 – 2021-04-08 (×3): 650 mg via ORAL
  Filled 2021-04-02 (×2): qty 2

## 2021-04-02 MED ORDER — ACETAMINOPHEN 325 MG PO TABS
325.0000 mg | ORAL_TABLET | Freq: Three times a day (TID) | ORAL | Status: DC
  Administered 2021-04-02 – 2021-04-08 (×18): 325 mg via ORAL
  Filled 2021-04-02 (×18): qty 1

## 2021-04-02 NOTE — ED Notes (Signed)
Pt transported to floor via stretcher, on monitor, on O2 tank with transport.

## 2021-04-02 NOTE — ED Notes (Signed)
Pt shown how to work the phone and granddaughter called.

## 2021-04-02 NOTE — ED Notes (Signed)
Attempted to give report. Said to call back in 10 minutes.

## 2021-04-02 NOTE — ED Notes (Signed)
I introduced myself to pt. Pt AxO x4, but HOH. Pt on purewick. GCS 15. Pt ate 85% of dinner tray. Pt given diet gingerale. Denies further needs. On 4L [place].

## 2021-04-02 NOTE — ED Triage Notes (Signed)
Pt from [facility] for eval of shob x 2 weeks. Also endorses burning with urination and back pain x 2 weeks. SpO2 86% on room air, does not normally wear home O2.

## 2021-04-02 NOTE — Progress Notes (Signed)
  Echocardiogram 2D Echocardiogram has been performed.  Shirley Leblanc 04/02/2021, 2:03 PM

## 2021-04-02 NOTE — ED Provider Notes (Signed)
Here, shortness of breath [facility] EMERGENCY DEPARTMENT Provider Note   CSN: [id number] Arrival date & time: 04/02/21  7829     History No chief complaint on file.   Shirley Leblanc is a 75 y.o. female past medical history of coronary artery disease, COPD, hypertension, morbid obesity who presents with new onset hypoxia, shortness of breath.  Patient was brought in from [facility] assisted living facility, complains of shortness of breath for the last 2 weeks.  She was hypoxic to 86% on room air, and does not normally wear at home oxygen.  Additionally patient reports that she has had increased swelling of her lower extremities, and that it makes it difficult for her to walk.  She normally walks with the assistance of a walker, however reports that it is getting to the point where she needs a wheelchair for her mobility.  Additionally patient sustained a fall in the shower 2 weeks ago, reports that she fell on her bottom.  She did not have a fall yesterday evening, just reports that she was slightly off balance.  Additionally patient reports that she has some feeling of vaginal pressure when she tries to urinate, has a feeling that her vagina is "falling out".  Patient denies chest pain at this time, does report that she occasionally has some intermittent chest pain.  Patient reports that she also often has some nausea and and vomiting, however none today.  HPI     Past Medical History:  Diagnosis Date   Bursitis    CAD (coronary artery disease)    a. nonobstructive CAD with 40% Prox LAD stenosis by cath in 2007   COPD (chronic obstructive pulmonary disease) (HCC)    Depression    DJD (degenerative joint disease)    GERD (gastroesophageal reflux disease)    Hyperlipemia    Hypertension    Left wrist fracture    Obesities, morbid ([place])    Open left ankle fracture    Syncope 11/2017   Urinary incontinence     Patient Active Problem List   Diagnosis Date  Noted   Acute on chronic respiratory failure with hypoxia ([place]) 04/02/2021   Dizziness    Tremor 09/18/2017   Encephalopathy    Tremors of nervous system    Near syncope 09/16/2017   Syncope 09/15/2017   CAD (coronary artery disease) 09/15/2017   At HIGH RISK for fall 05/15/2017   Chest pain 04/28/2017   Osteopenia 02/02/2017   Morbid obesity ([place]) 11/13/2016   Elevated troponin 11/13/2016   Chronic back pain 10/10/2016   Pain medication agreement signed 10/10/2016   Opioid dependence ([place]) 10/10/2016   Diabetes ([place]) 09/19/2016   Depression 04/18/2016   Hypothyroid 02/22/2016   Hyperlipidemia associated with type 2 diabetes mellitus ([place]) 02/22/2016   Chronic anticoagulation - Eliquis, CHADS2VASC=3 06/05/2015   Atrial fibrillation ([place]) 05/16/2015   S/P laparoscopic cholecystectomy    Hypertension associated with diabetes ([place]) 05/09/2015   Dyspnea on exertion 03/14/2011    Past Surgical History:  Procedure Laterality Date   CARDIAC CATHETERIZATION     CHOLECYSTECTOMY N/A 05/14/2015   Procedure: LAPAROSCOPIC CHOLECYSTECTOMY;  Surgeon: Coralie Keens, MD;  Location: [facility];  Service: General;  Laterality: N/A;   ESOPHAGOGASTRODUODENOSCOPY (EGD) WITH PROPOFOL N/A 11/11/2019   Procedure: ESOPHAGOGASTRODUODENOSCOPY (EGD) WITH PROPOFOL;  Surgeon: Carol Ada, MD;  Location: [facility];  Service: Endoscopy;  Laterality: N/A;   NECK SURGERY     SAVORY DILATION N/A 11/11/2019   Procedure: SAVORY DILATION;  Surgeon: Carol Ada, MD;  Location: [facility];  Service: Endoscopy;  Laterality: N/A;   TOTAL KNEE ARTHROPLASTY     x2   VAGINAL HYSTERECTOMY       OB History   No obstetric history on file.     Family History  Problem Relation Age of Onset   Lung disease Mother    Heart attack Father    Lung disease Other    Heart attack Other    Cancer Daughter        breast    Social History   Tobacco Use   Smoking status: Never   Smokeless tobacco: Never  Vaping  Use   Vaping Use: Never used  Substance Use Topics   Alcohol use: Yes    Comment: Occasionally    Drug use: No    Home Medications Prior to Admission medications   Medication Sig Start Date End Date Taking? Authorizing Provider  acetaminophen (TYLENOL) 325 MG tablet Take 650 mg by mouth every 12 (twelve) hours as needed for mild pain or fever.   Yes [provider]  albuterol (PROVENTIL HFA;VENTOLIN HFA) 108 (90 Base) MCG/ACT inhaler TAKE 2 PUFFS BY MOUTH EVERY 6 HOURS AS NEEDED FOR WHEEZE OR SHORTNESS OF BREATH Patient taking differently: Inhale 2 puffs into the lungs 4 (four) times daily as needed for wheezing or shortness of breath. 08/07/17  Yes Hawks, Christy A, FNP  atorvastatin (LIPITOR) 10 MG tablet Take 10 mg by mouth daily.   Yes [provider]  busPIRone (BUSPAR) 10 MG tablet Take 20 mg by mouth 3 (three) times daily.   Yes [provider]  Camphor-Menthol-Methyl Sal (SALONPAS) 3.08-02-08 % PTCH Apply 1 patch topically every 12 (twelve) hours as needed (pain).   Yes [provider]  diclofenac Sodium (VOLTAREN) 1 % GEL Apply 1 application topically 2 (two) times daily as needed (neck and shoulder pain). 10/13/19  Yes [provider]  fluconazole (DIFLUCAN) 150 MG tablet Take 150 mg by mouth daily.   Yes [provider]  hydrocortisone cream (PREPARATION H) 1 % Apply 1 application topically 2 (two) times daily as needed for itching.   Yes [provider]  levothyroxine (SYNTHROID) 75 MCG tablet Take 75 mcg by mouth daily before breakfast.   Yes [provider]  lisinopril (ZESTRIL) 40 MG tablet Take 40 mg by mouth daily.   Yes [provider]  loperamide (IMODIUM A-D) 2 MG tablet Take 2 mg by mouth 3 (three) times daily as needed for diarrhea or loose stools.   Yes [provider]  LORazepam (ATIVAN) 1 MG tablet Take 0.5 mg by mouth 2 (two) times daily.   Yes [provider]  ondansetron  (ZOFRAN) 8 MG tablet Take 8 mg by mouth every 8 (eight) hours as needed for nausea or vomiting.   Yes [provider]  oxyCODONE-acetaminophen (PERCOCET) 10-325 MG tablet Take 1 tablet by mouth in the morning, at noon, in the evening, and at bedtime.  07/12/14  Yes [provider]  pantoprazole (PROTONIX) 40 MG tablet Take 40 mg by mouth every evening.   Yes [provider]  polyethylene glycol (MIRALAX / GLYCOLAX) 17 g packet Take 17 g by mouth daily. (0800)   Yes [provider]  psyllium (REGULOID) 0.52 g capsule Take 0.52 g by mouth at bedtime. (2000)   Yes [provider]  senna (SENOKOT) 8.6 MG tablet Take 2 tablets by mouth at bedtime. (2000)   Yes [provider]  sertraline (ZOLOFT) 100 MG tablet Take 200 mg by mouth daily.   Yes [provider]  simethicone (MYLICON) 621 MG chewable tablet Chew 125 mg by mouth every 6 (six) hours as needed for flatulence.    Yes [provider]  tamsulosin (FLOMAX) 0.4 MG CAPS capsule Take 0.4 mg by mouth at bedtime. (2100) 10/10/19  Yes [provider]  torsemide (DEMADEX) 20 MG tablet Take 20 mg by mouth daily.   Yes [provider]  blood glucose meter kit and supplies KIT Dispense per insurance preference. Use up to four times daily . E 11.9 09/30/16   Hawks, Christy A, FNP  glucose blood (GLUCOSE METER TEST) test strip Use BID 09/29/16   Evelina Dun A, FNP    Allergies    Iodinated diagnostic agents, Penicillins, Sulfa antibiotics, and Penicillin g  Review of Systems   Review of Systems  Constitutional:  Positive for fatigue. Negative for chills, diaphoresis and fever.  Respiratory:  Positive for shortness of breath. Negative for chest tightness.   Cardiovascular:  Positive for leg swelling. Negative for chest pain.  Gastrointestinal:  Positive for nausea and vomiting.  Genitourinary:  Positive for vaginal pain. Negative for dysuria and flank pain.   Musculoskeletal:  Positive for back pain and gait problem.   Physical Exam Updated Vital Signs BP 97/73   Pulse 88   Temp 98.6 F (37 C) (Oral)   Resp 19   SpO2 94%   Physical Exam Vitals and nursing note reviewed.  Constitutional:      General: She is not in acute distress.    Appearance: Normal appearance.  HENT:     Head: Normocephalic and atraumatic.  Eyes:     General:        Right eye: No discharge.        Left eye: No discharge.  Cardiovascular:     Rate and Rhythm: Normal rate. Rhythm irregular.     Pulses: Normal pulses.     Heart sounds:    Friction rub present.  Pulmonary:     Effort: No respiratory distress.     Comments: Expiratory wheezing Abdominal:     General: Bowel sounds are normal.     Palpations: Abdomen is soft.     Tenderness: There is no abdominal tenderness.  Skin:    General: Skin is warm and dry.     Capillary Refill: Capillary refill takes less than 2 seconds.     Findings: Erythema and rash present.     Comments: Erythematous patches in bilateral lower extremities, over top of 2-3+ pitting edema, edema right greater than left  Neurological:     Mental Status: She is alert and oriented to person, place, and time.  Psychiatric:        Mood and Affect: Mood normal.        Behavior: Behavior normal.    ED Results / Procedures / Treatments   Labs (all labs ordered are listed, but only abnormal results are displayed) Labs Reviewed  COMPREHENSIVE METABOLIC PANEL - Abnormal; Notable for the following components:      Result Value   Glucose, Bld 106 (*)    Total Protein 6.4 (*)    Albumin 3.4 (*)    All other components within normal limits  CBC WITH DIFFERENTIAL/PLATELET - Abnormal; Notable for the following components:   Hemoglobin 11.2 (*)    MCHC 29.9 (*)    RDW 16.0 (*)    Platelets 118 (*)  All other components within normal limits  BRAIN NATRIURETIC PEPTIDE - Abnormal; Notable for the following components:   B Natriuretic  Peptide 236.4 (*)    All other components within normal limits  TROPONIN I (HIGH SENSITIVITY) - Abnormal; Notable for the following components:   Troponin I (High Sensitivity) 19 (*)    All other components within normal limits  TROPONIN I (HIGH SENSITIVITY)    EKG None  Radiology DG Chest Portable 1 View  Result Date: 04/02/2021 CLINICAL DATA:  Shortness of breath for 2 weeks, history of COPD EXAM: PORTABLE CHEST 1 VIEW COMPARISON:  Chest radiograph 12/25/2020 FINDINGS: The heart is mildly enlarged, unchanged. The mediastinal contours are within normal limits. Lung volumes are low. There is vascular congestion without overt pulmonary edema. Linear opacities in the left mid lung likely reflect subsegmental atelectasis or scar. There is no other focal airspace disease. There is no significant pleural effusion. There is no pneumothorax. Cervical spine fusion hardware and spinal stimulator are noted. There is no acute osseous abnormality. IMPRESSION: Unchanged cardiomegaly with vascular congestion but no overt pulmonary edema. Electronically Signed   By: Valetta Mole M.D.   On: 04/02/2021 08:19    Procedures Procedures   Medications Ordered in ED Medications  oxyCODONE-acetaminophen (PERCOCET/ROXICET) 5-325 MG per tablet 2 tablet (2 tablets Oral Given 04/02/21 0812)  ipratropium-albuterol (DUONEB) 0.5-2.5 (3) MG/3ML nebulizer solution 3 mL (3 mLs Nebulization Given 04/02/21 1032)  methylPREDNISolone sodium succinate (SOLU-MEDROL) 125 mg/2 mL injection 125 mg (125 mg Intravenous Given 04/02/21 1033)  furosemide (LASIX) injection 40 mg (40 mg Intravenous Given 04/02/21 1033)    ED Course  I have reviewed the triage vital signs and the nursing notes.  Pertinent labs & imaging results that were available during my care of the patient were reviewed by me and considered in my medical decision making (see chart for details).  Clinical Course as of 04/02/21 1056  Tue Apr 02, 2021  1050 Troponin I (High  Sensitivity)(!): 19 Favor demand ischemia but will monitor serial [CP]    Clinical Course User Index [CP] Huy Majid, Joesph Fillers, PA-C   MDM Rules/Calculators/A&P                         Lab work and patient presentation appear to represent an acute on chronic exacerbation of her existing congestive heart failure versus COPD.  Patient has a new oxygen requirement, desaturates below 90% if she is taken off 2 L nasal cannula.  Previously had no oxygen requirement at home.  Has some wheezing. Patient is fluid overloaded on exam, has 2-3+ pitting edema in her lower extremities, right greater than left, with signs of stasis dermatitis.  We will begin DuoNeb, Solu-Medrol, and Lasix.  Based on new oxygen requirement feel the patient would benefit from hospital admission at this time.  We will consult the hospitalist to discuss.  Consulted with hospitalist who agrees to see patient and admit. Final Clinical Impression(s) / ED Diagnoses Final diagnoses:  Hypoxia  Acute on chronic congestive heart failure, unspecified heart failure type ([place])  Chronic obstructive pulmonary disease, unspecified COPD type [facility])    Rx / DC Orders ED Discharge Orders     None        Anselmo Pickler, PA-C 04/02/21 1056    Kommor, [place], MD 04/02/21 1553

## 2021-04-02 NOTE — H&P (Signed)
History and Physical    Shirley Leblanc QQI:[id number] DOB: 1946/05/29 DOA: 04/02/2021  PCP: Reymundo Poll, MD Consultants:  Collene Mares - GI Patient coming from: [facility]; NOK: Deno Etienne, [phone]  Chief Complaint: SOB  HPI: Shirley Leblanc is a 75 y.o. female with medical history significant of CAD; COPD; HTN; HLD; and obesity presenting with SOB.  She reports many symptoms and feeling bad for 6 months or longer.  She has had recurrent cough and has been treated with antibiotics x 3 in the last 6 months.  She also has chronic LLQ abdominal pain and myalgias.  Her nurse suggested she come to the hospital today.  Her granddaughter reported that she had COVID and then PNA and then started building up fluid in her legs.  She was complaining of CP and mild SOB today.  She is not able to support her weight with her walker.  Both legs have been red for a month or so.  She has been unable to bear weight for about a week now.  Unsure if she is gaining weight.  She does not normally wear O2.  +orthopnea.  She "about passed away" about 6 years ago with edema, was in ICU.  Her last echo was in 2019 and she had grade 1 diastolic dysfunction.    ED Course: Has CHF, COPD, here with SOB and hypoxia to 80s.  On 2L and stable.  BNP mildly elevated, CXR with pulm congestion, mildly increased troponin.  She is in afib, off AC.  Also wheezing, given Duonebs.  Review of Systems: As per HPI; otherwise review of systems reviewed and negative.   Ambulatory Status:  Ambulates with a walker   Past Medical History:  Diagnosis Date   Bursitis    CAD (coronary artery disease)    a. nonobstructive CAD with 40% Prox LAD stenosis by cath in 2007   COPD (chronic obstructive pulmonary disease) (HCC)    Depression    DJD (degenerative joint disease)    GERD (gastroesophageal reflux disease)    Hyperlipemia    Hypertension    Left wrist fracture    Obesities, morbid ([place])    Open left ankle fracture     Syncope 11/2017   Urinary incontinence     Past Surgical History:  Procedure Laterality Date   CARDIAC CATHETERIZATION     CHOLECYSTECTOMY N/A 05/14/2015   Procedure: LAPAROSCOPIC CHOLECYSTECTOMY;  Surgeon: Coralie Keens, MD;  Location: [facility];  Service: General;  Laterality: N/A;   ESOPHAGOGASTRODUODENOSCOPY (EGD) WITH PROPOFOL N/A 11/11/2019   Procedure: ESOPHAGOGASTRODUODENOSCOPY (EGD) WITH PROPOFOL;  Surgeon: Carol Ada, MD;  Location: [facility];  Service: Endoscopy;  Laterality: N/A;   NECK SURGERY     SAVORY DILATION N/A 11/11/2019   Procedure: SAVORY DILATION;  Surgeon: Carol Ada, MD;  Location: [facility];  Service: Endoscopy;  Laterality: N/A;   TOTAL KNEE ARTHROPLASTY     x2   VAGINAL HYSTERECTOMY      Social History   Socioeconomic History   Marital status: Widowed    Spouse name: Not on file   Number of children: 4   Years of education: Not on file   Highest education level: Not on file  Occupational History   Occupation: RETIRED  Tobacco Use   Smoking status: Never   Smokeless tobacco: Never  Vaping Use   Vaping Use: Never used  Substance and Sexual Activity   Alcohol use: Yes    Comment: Occasionally    Drug use: No  Sexual activity: Never    Birth control/protection: None, Post-menopausal  Other Topics Concern   Not on file  Social History Narrative   Not on file   Social Determinants of Health   Financial Resource Strain: Not on file  Food Insecurity: Not on file  Transportation Needs: Not on file  Physical Activity: Not on file  Stress: Not on file  Social Connections: Not on file  Intimate Partner Violence: Not on file    Allergies  Allergen Reactions   Iodinated Diagnostic Agents Anaphylaxis   Penicillins Hives, Itching and Rash    Has patient had a PCN reaction causing immediate rash, facial/tongue/throat swelling, SOB or lightheadedness with hypotension Yes Has patient had a PCN reaction causing severe rash involving  mucus membranes or skin necrosis: Yes Has patient had a PCN reaction that required hospitalization No Has patient had a PCN reaction occurring within the last 10 years: No If all of the above answers are "NO", then may proceed with Cephalosporin use.    Sulfa Antibiotics Hives, Itching and Rash   Penicillin G Hives, Itching and Rash    Family History  Problem Relation Age of Onset   Lung disease Mother    Heart attack Father    Lung disease Other    Heart attack Other    Cancer Daughter        breast    Prior to Admission medications   Medication Sig Start Date End Date Taking? Authorizing Provider  acetaminophen (TYLENOL) 325 MG tablet Take 650 mg by mouth every 12 (twelve) hours as needed for mild pain or fever.   Yes [provider]  albuterol (PROVENTIL HFA;VENTOLIN HFA) 108 (90 Base) MCG/ACT inhaler TAKE 2 PUFFS BY MOUTH EVERY 6 HOURS AS NEEDED FOR WHEEZE OR SHORTNESS OF BREATH Patient taking differently: Inhale 2 puffs into the lungs 4 (four) times daily as needed for wheezing or shortness of breath. 08/07/17  Yes Hawks, Christy A, FNP  atorvastatin (LIPITOR) 10 MG tablet Take 10 mg by mouth daily.   Yes [provider]  busPIRone (BUSPAR) 10 MG tablet Take 20 mg by mouth 3 (three) times daily.   Yes [provider]  Camphor-Menthol-Methyl Sal (SALONPAS) 3.08-02-08 % PTCH Apply 1 patch topically every 12 (twelve) hours as needed (pain).   Yes [provider]  diclofenac Sodium (VOLTAREN) 1 % GEL Apply 1 application topically 2 (two) times daily as needed (neck and shoulder pain). 10/13/19  Yes [provider]  fluconazole (DIFLUCAN) 150 MG tablet Take 150 mg by mouth daily.   Yes [provider]  hydrocortisone cream (PREPARATION H) 1 % Apply 1 application topically 2 (two) times daily as needed for itching.   Yes [provider]  levothyroxine (SYNTHROID) 75 MCG tablet Take 75 mcg by mouth daily before breakfast.   Yes  [provider]  lisinopril (ZESTRIL) 40 MG tablet Take 40 mg by mouth daily.   Yes [provider]  loperamide (IMODIUM A-D) 2 MG tablet Take 2 mg by mouth 3 (three) times daily as needed for diarrhea or loose stools.   Yes [provider]  LORazepam (ATIVAN) 1 MG tablet Take 0.5 mg by mouth 2 (two) times daily.   Yes [provider]  ondansetron (ZOFRAN) 8 MG tablet Take 8 mg by mouth every 8 (eight) hours as needed for nausea or vomiting.   Yes [provider]  oxyCODONE-acetaminophen (PERCOCET) 10-325 MG tablet Take 1 tablet by mouth in the morning, at  noon, in the evening, and at bedtime.  07/12/14  Yes [provider]  pantoprazole (PROTONIX) 40 MG tablet Take 40 mg by mouth every evening.   Yes [provider]  polyethylene glycol (MIRALAX / GLYCOLAX) 17 g packet Take 17 g by mouth daily. (0800)   Yes [provider]  psyllium (REGULOID) 0.52 g capsule Take 0.52 g by mouth at bedtime. (2000)   Yes [provider]  senna (SENOKOT) 8.6 MG tablet Take 2 tablets by mouth at bedtime. (2000)   Yes [provider]  sertraline (ZOLOFT) 100 MG tablet Take 200 mg by mouth daily.   Yes [provider]  simethicone (MYLICON) 115 MG chewable tablet Chew 125 mg by mouth every 6 (six) hours as needed for flatulence.    Yes [provider]  tamsulosin (FLOMAX) 0.4 MG CAPS capsule Take 0.4 mg by mouth at bedtime. (2100) 10/10/19  Yes [provider]  torsemide (DEMADEX) 20 MG tablet Take 20 mg by mouth daily.   Yes [provider]  blood glucose meter kit and supplies KIT Dispense per insurance preference. Use up to four times daily . E 11.9 09/30/16   Evelina Dun A, FNP  glucose blood (GLUCOSE METER TEST) test strip Use BID 09/29/16   [name], FNP    Physical Exam: Vitals:   04/02/21 1030 04/02/21 1031 04/02/21 1145 04/02/21 1300  BP: (!) 129/99  125/84 130/63  Pulse: 81   85 86  Resp: 15  (!) 21 18  Temp:  98.6 F (37 C)    TempSrc:  Oral    SpO2: 95%  94% 91%     General:  Appears calm and comfortable and is in NAD Eyes:  PERRL, EOMI, normal lids, iris ENT:  grossly normal hearing, lips & tongue, mmm Neck:  no LAD, masses or thyromegaly Cardiovascular:  RRR, no m/r/g. 2-3+ LE edema.  Respiratory:   Scan bibasilar crackles with end expiratory wheezing..  Mildly incresed respiratory effort on [place] O2. Abdomen:  soft, LLQ TTP (reports chronic), ND Skin:  nB stasis dermatitis, symmetric, no concern for cellulitis Musculoskeletal:  grossly normal tone BUE/BLE, good ROM, no bony abnormality Psychiatric:  grossly normal mood and affect, speech fluent and appropriate, AOx3 Neurologic:  CN 2-12 grossly intact, moves all extremities in coordinated fashion   Radiological Exams on Admission: Independently reviewed - see discussion in A/P where applicable  DG Chest Portable 1 View  Result Date: 04/02/2021 CLINICAL DATA:  Shortness of breath for 2 weeks, history of COPD EXAM: PORTABLE CHEST 1 VIEW COMPARISON:  Chest radiograph 12/25/2020 FINDINGS: The heart is mildly enlarged, unchanged. The mediastinal contours are within normal limits. Lung volumes are low. There is vascular congestion without overt pulmonary edema. Linear opacities in the left mid lung likely reflect subsegmental atelectasis or scar. There is no other focal airspace disease. There is no significant pleural effusion. There is no pneumothorax. Cervical spine fusion hardware and spinal stimulator are noted. There is no acute osseous abnormality. IMPRESSION: Unchanged cardiomegaly with vascular congestion but no overt pulmonary edema. Electronically Signed   By: Valetta Mole M.D.   On: 04/02/2021 08:19    EKG: Independently reviewed.  Afib with rate 90; IVCD; nonspecific ST changes with no evidence of acute ischemia   Labs on Admission: I have personally reviewed the available labs and imaging studies  at the time of the admission.  Pertinent labs:   Unremarkable CMP BNP 236.4; 128 on 09/20/20 HS troponin 19  WBC 5.7 Hgb 11.2; 12.8 on 09/20/20 Platelets 118 - stable   Assessment/Plan Principal Problem:   Acute on chronic diastolic CHF (congestive heart failure) (HCC) Active Problems:   Hypertension associated with diabetes ([place])   Atrial fibrillation (HCC)   Hypothyroid   Hyperlipidemia associated with type 2 diabetes mellitus (HCC)   Chronic back pain   Stasis dermatitis of both legs   Acute on chronic diastolic CHF -Patient with known h/o chronic diastolic CHF presenting with worsening SOB and hypoxia -CXR consistent with vascular congestion without overt pulmonary edema -Mildly elevated BNP compared with prior baseline -With elevated BNP and abnl CXR, acute decompensated CHF seems probable as diagnosis -Will admit, as per the Emergency HF Mortality Risk Grade.  The patient has:  severe pulmonary edema requiring new O2 therapy -Will request echocardiogram -CHF order set utilized; may need CHF team consult but will hold until Echo results are available -Was given Lasix 40 mg x 1 in ER and will repeat with 40 mg IV BID (hold torsemide) -Continue [place] O2 for now -Normal kidney function at this time, will follow -Mildly elevated HS troponin is likely related to demand ischemia; doubt ACS based on symptoms -Continue Lisinopril, add low-dose Coreg -If echo shows systolic CHF, may need transition to ARB vs. Holding ACE in order to start Entresto  HTN -Continue Lisinopril, Flomax -As above, add low-dose Corege -Will also add prn hydralazine  HLD -Continue Lipitor  Afib -Not on medication for rate control -No longer on AC or even ASA for uncertain reason; this was discussed at her 09/2019 ER visit -This may need to be revisited -For now, will add 81 mg ASA  COPD -Reported h/o COPD but she has never been a smoker so this seems less likely -Initial plan was for steroids and  standing Duonebs, but presentation appears more c/w volume overload at this time -Will give albuterol q2h prn   Mood d/o -Continue Buspar, Ativan, Zoloft  Hypothyroidism -Continue Synthroid at current dose for now   Stasis dermatitis -Likely associated with slow development of volume overload -Will add Eucerin:TAC to see if this improves it -She may benefit from Unna boots  Chronic pain -I have reviewed this patient in the [place] Controlled Substances Reporting System.  She is receiving medications from her facility and so they are not listed in the registry. -Continue home Percocet.      Note: This patient has been tested and is pending for the novel coronavirus COVID-19.   DVT prophylaxis: Lovenox  Code Status:  DNR - confirmed with patient Family Communication: None present; I spoke with her granddaughter by telephone at the time of admission Disposition Plan:  The patient is from: home  Anticipated d/c is to: home, possibly with [facility] services  Anticipated d/c date will depend on clinical response to treatment, likely 2-4 days  Patient is currently: acutely ill Consults called: Cardiology; [facility] team; PT/OT; Nutrition Admission status: Admit - It is my clinical opinion that admission to INPATIENT is reasonable and necessary because this patient will require at least 2 midnights in the hospital to treat this condition based on the medical complexity of the problems presented.  Given the aforementioned information, the predictability of an adverse outcome is felt to be significant.     Karmen Bongo MD [facility]   How to contact the [facility] Attending or Consulting provider [place] or covering provider during after hours [place], for this patient?  Check the care team in [facility] and look  for a) attending/consulting [place] provider listed and b) the [facility] team listed Log into [email or web address] and use [place]'s universal password to access. If you do not have the password, please contact  the hospital operator. Locate the [facility] provider you are looking for under [facility] and page to a number that you can be directly reached. If you still have difficulty reaching the provider, please page the [facility] (Director on Call) for the Hospitalists listed on amion for assistance.   04/02/2021, 1:24 PM

## 2021-04-03 DIAGNOSIS — J9621 Acute and chronic respiratory failure with hypoxia: Secondary | ICD-10-CM

## 2021-04-03 DIAGNOSIS — I480 Paroxysmal atrial fibrillation: Secondary | ICD-10-CM

## 2021-04-03 DIAGNOSIS — E1159 Type 2 diabetes mellitus with other circulatory complications: Secondary | ICD-10-CM

## 2021-04-03 DIAGNOSIS — I152 Hypertension secondary to endocrine disorders: Secondary | ICD-10-CM

## 2021-04-03 DIAGNOSIS — G8929 Other chronic pain: Secondary | ICD-10-CM

## 2021-04-03 DIAGNOSIS — E039 Hypothyroidism, unspecified: Secondary | ICD-10-CM

## 2021-04-03 DIAGNOSIS — I872 Venous insufficiency (chronic) (peripheral): Secondary | ICD-10-CM

## 2021-04-03 DIAGNOSIS — E1169 Type 2 diabetes mellitus with other specified complication: Secondary | ICD-10-CM

## 2021-04-03 DIAGNOSIS — E785 Hyperlipidemia, unspecified: Secondary | ICD-10-CM

## 2021-04-03 DIAGNOSIS — M545 Low back pain, unspecified: Secondary | ICD-10-CM

## 2021-04-03 DIAGNOSIS — I5033 Acute on chronic diastolic (congestive) heart failure: Secondary | ICD-10-CM

## 2021-04-03 LAB — CBC
HCT: 35.4 % — ABNORMAL LOW (ref 36.0–46.0)
Hemoglobin: 10.8 g/dL — ABNORMAL LOW (ref 12.0–15.0)
MCH: 28.2 pg (ref 26.0–34.0)
MCHC: 30.5 g/dL (ref 30.0–36.0)
MCV: 92.4 fL (ref 80.0–100.0)
Platelets: 133 10*3/uL — ABNORMAL LOW (ref 150–400)
RBC: 3.83 MIL/uL — ABNORMAL LOW (ref 3.87–5.11)
RDW: 15.9 % — ABNORMAL HIGH (ref 11.5–15.5)
WBC: 3.8 10*3/uL — ABNORMAL LOW (ref 4.0–10.5)
nRBC: 0 % (ref 0.0–0.2)

## 2021-04-03 LAB — BASIC METABOLIC PANEL
Anion gap: 5 (ref 5–15)
BUN: 16 mg/dL (ref 8–23)
CO2: 34 mmol/L — ABNORMAL HIGH (ref 22–32)
Calcium: 9.1 mg/dL (ref 8.9–10.3)
Chloride: 96 mmol/L — ABNORMAL LOW (ref 98–111)
Creatinine, Ser: 0.85 mg/dL (ref 0.44–1.00)
GFR, Estimated: >60 mL/min (ref >60)
Glucose, Bld: 154 mg/dL — ABNORMAL HIGH (ref 70–99)
Potassium: 4.7 mmol/L (ref 3.5–5.1)
Sodium: 135 mmol/L (ref 135–145)

## 2021-04-03 LAB — GLUCOSE, CAPILLARY
Glucose-Capillary: 126 mg/dL — ABNORMAL HIGH (ref 70–99)
Glucose-Capillary: 134 mg/dL — ABNORMAL HIGH (ref 70–99)
Glucose-Capillary: 106 mg/dL — ABNORMAL HIGH (ref 70–99)
Glucose-Capillary: 116 mg/dL — ABNORMAL HIGH (ref 70–99)

## 2021-04-03 MED ORDER — ENOXAPARIN SODIUM 60 MG/0.6ML IJ SOSY
60.0000 mg | PREFILLED_SYRINGE | Freq: Every day | INTRAMUSCULAR | Status: DC
  Administered 2021-04-04 – 2021-04-08 (×5): 60 mg via SUBCUTANEOUS
  Filled 2021-04-03 (×6): qty 0.6

## 2021-04-03 MED ORDER — PROSOURCE PLUS PO LIQD
30.0000 mL | Freq: Two times a day (BID) | ORAL | Status: DC
  Administered 2021-04-03 – 2021-04-07 (×7): 30 mL via ORAL
  Filled 2021-04-03 (×9): qty 30

## 2021-04-03 MED ORDER — ADULT MULTIVITAMIN W/MINERALS CH
1.0000 | ORAL_TABLET | Freq: Every day | ORAL | Status: DC
  Administered 2021-04-03 – 2021-04-08 (×6): 1 via ORAL
  Filled 2021-04-03 (×6): qty 1

## 2021-04-03 NOTE — Evaluation (Signed)
Occupational Therapy Evaluation Patient Details Name: Shirley Leblanc MRN: [id number] DOB: 1946-03-21 Today's Date: 04/03/2021    History of Present Illness 75 y.o. female presenting with SOB and chest pains and inability to support her weight with her Rollator.   She has had recurrent cough and has been treated with antibiotics x 3 in the last 6 months.  She also has chronic LLQ abdominal pain and myalgias. Lives at [place] and presents to ED 04/02/21 at suggestion of facility RN. In ED found to have hypoxia in the 80%SaO2 rebounds with 2L O2 via [place]  BNP mildly elevated, CXR with pulm congestion, mildly increased troponin.  She is in afib, off AC.  PMH:  CAD; COPD; HTN; HLD; and obesity   Clinical Impression   PTA, pt resides at [facility] and reports Modified Independence with ADLs and mobility (Rollator and scooter for long distances) though reports increasing difficulty. Pt endorses recent falls. Pt presents now with deficits in strength, standing balance and cardiopulmonary tolerance. Pt able to demo side steps with RW at [place] A (+2 for safety) with noted shakiness, B knee buckling and fatigue. Pt overall Min A for UB ADLs and Max A for LB ADLs due to deficits. Recommend SNF rehab prior to return to ALF to maximize safety and decrease fall risk. Plan to educate in energy conservation, UE HEP and use of AE as needed to maximize ADL independence.  SpO2 93% on 4 L O2, HR <120bpm with activity    Follow Up Recommendations  SNF    Equipment Recommendations  None recommended by OT    Recommendations for Other Services       Precautions / Restrictions Precautions Precautions: Fall Precaution Comments: fell in bathroom 2 wks ago and fell off bed 1 wk ago; monitor O2 - does not wear at baseline Restrictions Weight Bearing Restrictions: No      Mobility Bed Mobility Overal bed mobility: Needs Assistance Bed Mobility: Supine to Sit     Supine to sit: HOB elevated;Supervision      General bed mobility comments: no physical assist needed, HOB elevated and light use of bedrails    Transfers Overall transfer level: Needs assistance Equipment used: Rolling walker (2 wheeled) Transfers: Sit to/from Stand Sit to Stand: Min guard;From elevated surface         General transfer comment: min guard x2 for safety, multiple failed attempts from low surface, with bed elevated able to power up and self steady with increased effort, bilateral knee buckling on standing requires seated rest break before attempting stepping to recliner    Balance Overall balance assessment: Needs assistance Sitting-balance support: Feet supported;No upper extremity supported Sitting balance-Leahy Scale: Good     Standing balance support: Bilateral upper extremity supported Standing balance-Leahy Scale: Poor Standing balance comment: requires increased bilateral UE support on RW                           ADL either performed or assessed with clinical judgement   ADL Overall ADL's : Needs assistance/impaired Eating/Feeding: Independent;Sitting   Grooming: Set up;Sitting   Upper Body Bathing: Minimal assistance;Sitting   Lower Body Bathing: Maximal assistance;Sit to/from stand   Upper Body Dressing : Set up;Sitting   Lower Body Dressing: Maximal assistance;Sit to/from stand Lower Body Dressing Details (indicate cue type and reason): Total A to don socks though reports does not wear at baseline. Reports it takes a while to get dressed at home,  educated about possibility of AE use     Toileting- Water quality scientist and Hygiene: Maximal assistance;Sit to/from stand         General ADL Comments: Pt limited by fluid overload, shakiness and knee buckling in standing, and overall decreased strength.     Vision Baseline Vision/History: 0 No visual deficits Ability to See in Adequate Light: 0 Adequate Patient Visual Report: No change from baseline Vision Assessment?: No  apparent visual deficits     Perception     Praxis      Pertinent Vitals/Pain Pain Assessment: No/denies pain     Hand Dominance Right   Extremity/Trunk Assessment Upper Extremity Assessment Upper Extremity Assessment: Generalized weakness   Lower Extremity Assessment Lower Extremity Assessment: Defer to PT evaluation RLE Deficits / Details: increased edema and obesity limiting ROM, strength grossly assessed at 2+/5 RLE Coordination: decreased fine motor LLE Deficits / Details: increased edema and obesity limiting ROM, strength grossly assessed at 2+/5 LLE Coordination: decreased fine motor   Cervical / Trunk Assessment Cervical / Trunk Assessment: Normal   Communication Communication Communication: HOH   Cognition Arousal/Alertness: Awake/alert Behavior During Therapy: WFL for tasks assessed/performed Overall Cognitive Status: Within Functional Limits for tasks assessed                                     General Comments  SpO2 93% on 4 L O2 after activity, HR <120bpm. Noted 1/4 DOE    Exercises     Shoulder Instructions      Home Living Family/patient expects to be discharged to:: [facility]isted [facility])                   Bathroom Shower/Tub: Walk-in shower         Home Equipment: Environmental consultant - 4 wheels;Shower seat;Electric scooter;Grab bars - tub/shower;Grab bars - toilet;Hand held shower head;Other (comment) (adjustable bed, lift chair)          Prior Functioning/Environment Level of Independence: Needs assistance  Gait / Transfers Assistance Needed: uses Rollator to get around room and to bathroom, uses scooter outside of room ADL's / Homemaking Assistance Needed: independent with dressing, gets into shower with difficulty (says staff will not help), facility provides meals, laundry and medication management            OT Problem List: Decreased strength;Decreased activity tolerance;Impaired balance (sitting and/or  standing);Cardiopulmonary status limiting activity      OT Treatment/Interventions: Self-care/ADL training;Therapeutic exercise;Energy conservation;DME and/or AE instruction;Therapeutic activities;Patient/family education;Balance training    OT Goals(Current goals can be found in the care plan section) Acute Rehab OT Goals Patient Stated Goal: get back to being active at her facility OT Goal Formulation: With patient Time For Goal Achievement: 04/17/21 Potential to Achieve Goals: Good  OT Frequency: Min 2X/week   Barriers to D/C:            Co-evaluation PT/OT/SLP Co-Evaluation/Treatment: Yes Reason for Co-Treatment: For patient/therapist safety;To address functional/ADL transfers   OT goals addressed during session: ADL's and self-care      AM-PAC OT "6 Clicks" Daily Activity     Outcome Measure Help from another person eating meals?: None Help from another person taking care of personal grooming?: A Little Help from another person toileting, which includes using toliet, bedpan, or urinal?: A Lot Help from another person bathing (including washing, rinsing, drying)?: A Lot Help from another person to put on and taking off  regular upper body clothing?: A Little Help from another person to put on and taking off regular lower body clothing?: A Lot 6 Click Score: 16   End of Session Equipment Utilized During Treatment: Gait belt;Rolling walker;Oxygen Nurse Communication: Mobility status  Activity Tolerance: Patient tolerated treatment well Patient left: in chair;with call bell/phone within reach;with chair alarm set;with nursing/sitter in room (NTs entering to assist with bath)  OT Visit Diagnosis: Unsteadiness on feet (R26.81);Other abnormalities of gait and mobility (R26.89);Muscle weakness (generalized) (M62.81);History of falling (Z91.81)                Time: [id number] OT Time Calculation (min): 27 min Charges:  OT General Charges $OT Visit: 1 Visit OT Evaluation $OT  Eval Moderate Complexity: 1 Mod  Malachy Chamber, OTR/L Acute Rehab Services Office: [phone]   Layla Maw 04/03/2021, 10:23 AM

## 2021-04-03 NOTE — TOC Initial Note (Addendum)
Transition of Care [facility]) - Initial/Assessment Note    Patient Details  Name: Shirley Leblanc MRN: [id number] Date of Birth: 09/16/1945  Transition of Care [facility]) CM/SW Contact:    Tresa Endo Phone Number: 04/03/2021, 4:17 PM  Clinical Narrative:                 CSW received SNF consult. CSW met with pt and pt granddaughter/POA at bedside. CSW introduced self and explained role at the hospital. Pt reports that PTA the pt lived at [facility]. PT reports pt requires min guard for bed mobility and sit>stand from elevated surface, and min A for safety and RW management with side-stepping from bed to chair level of mobility.   CSW reviewed PT/OT recommendations for SNF. Pt reports no being sure what to do but to speak with her POA/ [name]. Pt POA gave CSW permission to fax out to facilities in the area. Pt has no preference of facility at this time, but knows she does not want [facility]. Pt gradaughter will follow up with pt children to get a final decision on the SNF or cosult Hospice to ALF. CSW gave pt medicare.gov rating list to review. Pt has had all covid vaccines plus both boosters. CSW will continue to follow.          Patient Goals and CMS Choice        Expected Discharge Plan and Services                                                Prior Living Arrangements/Services                       Activities of Daily Living   ADL Screening (condition at time of admission) Patient's cognitive ability adequate to safely complete daily activities?: No Is the patient deaf or have difficulty hearing?: Yes Does the patient have difficulty seeing, even when wearing glasses/contacts?: No Does the patient have difficulty concentrating, remembering, or making decisions?: No Patient able to express need for assistance with ADLs?: Yes Does the patient have difficulty dressing or bathing?: Yes Independently performs ADLs?: No Communication:  Independent Dressing (OT): Needs assistance Is this a change from baseline?: Pre-admission baseline Grooming: Needs assistance Is this a change from baseline?: Pre-admission baseline Feeding: Independent Bathing: Needs assistance Is this a change from baseline?: Pre-admission baseline Toileting: Needs assistance Is this a change from baseline?: Pre-admission baseline In/Out Bed: Needs assistance Is this a change from baseline?: Pre-admission baseline Walks in Home: Needs assistance Is this a change from baseline?: Pre-admission baseline Does the patient have difficulty walking or climbing stairs?: Yes Weakness of Legs: Both Weakness of Arms/Hands: None  Permission Sought/Granted                  Emotional Assessment              Admission diagnosis:  Hypoxia [R09.02] Acute on chronic respiratory failure with hypoxia (HCC) [J96.21] Chronic obstructive pulmonary disease, unspecified COPD type ([place]) [J44.9] Acute on chronic congestive heart failure, unspecified heart failure type ([place]) [I50.9] Patient Active Problem List   Diagnosis Date Noted   Acute on chronic diastolic CHF (congestive heart failure) ([place]) 04/02/2021   Stasis dermatitis of both legs 04/02/2021   Dizziness    Tremor 09/18/2017   Encephalopathy  Tremors of nervous system    Near syncope 09/16/2017   Syncope 09/15/2017   CAD (coronary artery disease) 09/15/2017   At HIGH RISK for fall 05/15/2017   Chest pain 04/28/2017   Osteopenia 02/02/2017   Morbid obesity ([place]) 11/13/2016   Elevated troponin 11/13/2016   Chronic back pain 10/10/2016   Pain medication agreement signed 10/10/2016   Opioid dependence ([place]) 10/10/2016   Diabetes ([place]) 09/19/2016   Depression 04/18/2016   Hypothyroid 02/22/2016   Hyperlipidemia associated with type 2 diabetes mellitus ([place]) 02/22/2016   Chronic anticoagulation - Eliquis, CHADS2VASC=3 06/05/2015   Atrial fibrillation ([place]) 05/16/2015   S/P laparoscopic  cholecystectomy    Hypertension associated with diabetes ([place]) 05/09/2015   Dyspnea on exertion 03/14/2011   PCP:  Reymundo Poll, MD Pharmacy:   [facility], [place] - [address] [place] [address] Phone: [phone] Fax: [phone]     Social Determinants of Health (SDOH) Interventions    Readmission Risk Interventions No flowsheet data found.

## 2021-04-03 NOTE — Evaluation (Signed)
Physical Therapy Evaluation Patient Details Name: Shirley Leblanc MRN: [id number] DOB: [birth year] Today's Date: 04/03/2021   History of Present Illness  75 y.o. female presenting with SOB and chest pains and inability to support her weight with her Rollator.   She has had recurrent cough and has been treated with antibiotics x 3 in the last 6 months.  She also has chronic LLQ abdominal pain and myalgias. Lives at [facility] and presents to ED 04/02/21 at suggestion of facility RN. In ED found to have hypoxia in the 80%SaO2 rebounds with 2L O2 via [place]  BNP mildly elevated, CXR with pulm congestion, mildly increased troponin.  She is in afib, off AC.  PMH:  CAD; COPD; HTN; HLD; and obesity  Clinical Impression  PTA pt living and [facility] where she used Rollator to get around her room and scooter to go to meals and activities within the facility. Pt reports she has to take her own showers but could use help. Facility does provide for meals, laundry, and medication management. Pt is currently limited in safe mobility by decreased strength, ROM, balance and increased O2 demand. Pt requires min guard for bed mobility and sit>stand from elevated surface, and min A for safety and RW management with side-stepping from bed to chair. PT recommending SNF level rehab prior to return to her ALF. PT will continue to follow acutely.     Follow Up Recommendations SNF    Equipment Recommendations  None recommended by PT (has necessary equipment)       Precautions / Restrictions Precautions Precautions: Fall Precaution Comments: fell in bathroom 2 wks ago and fell off bed 1 wk      Mobility  Bed Mobility Overal bed mobility: Needs Assistance Bed Mobility: Supine to Sit     Supine to sit: Min guard;HOB elevated     General bed mobility comments: min guard for safety, no physical assist required    Transfers Overall transfer level: Needs assistance   Transfers: Sit to/from Stand Sit to Stand: Min  guard;From elevated surface         General transfer comment: min guard x2 for safety, multiple failed attempts from low surface, with bed elevated able to power up and self steady with increased effort, bilateral knee buckling on standing requires seated rest break before attempting stepping to recliner  Ambulation/Gait Ambulation/Gait assistance: Min assist;+2 safety/equipment Gait Distance (Feet): 1.5 Feet Assistive device: Rolling walker (2 wheeled) Gait Pattern/deviations: Step-to pattern;Decreased step length - right;Decreased step length - left;Shuffle;Wide base of support Gait velocity: slowed Gait velocity interpretation: <1.31 ft/sec, indicative of household ambulator General Gait Details: min A for management of RW with stepping to recliner on L, requires 1x seated rest break due to bilateral knee buckling      Balance Overall balance assessment: Needs assistance Sitting-balance support: Feet supported;No upper extremity supported Sitting balance-Leahy Scale: Good     Standing balance support: Bilateral upper extremity supported Standing balance-Leahy Scale: Poor Standing balance comment: requires increased bilateral UE support on RW                             Pertinent Vitals/Pain Pain Assessment: No/denies pain    Home Living Family/patient expects to be discharged to:: Assisted living [facility])               Home Equipment: Gilford Rile - 4 wheels;Shower seat;Electric scooter;Grab bars - tub/shower;Grab bars - toilet;Hand held shower head;Other (comment) (adjustable  bed)      Prior Function Level of Independence: Needs assistance   Gait / Transfers Assistance Needed: uses RW to get around room and to bathroom, uses scooter  ADL's / Homemaking Assistance Needed: independent with dressing, gets into shower with difficulty (says staff will not help), facility provides meals, laundry and medication management        Hand Dominance   Dominant  Hand: Right    Extremity/Trunk Assessment   Upper Extremity Assessment Upper Extremity Assessment: Defer to OT evaluation    Lower Extremity Assessment Lower Extremity Assessment: RLE deficits/detail;LLE deficits/detail;Generalized weakness RLE Deficits / Details: increased edema and obesity limiting ROM, strength grossly assessed at 2+/5 RLE Coordination: decreased fine motor LLE Deficits / Details: increased edema and obesity limiting ROM, strength grossly assessed at 2+/5 LLE Coordination: decreased fine motor       Communication   Communication: HOH  Cognition Arousal/Alertness: Awake/alert Behavior During Therapy: WFL for tasks assessed/performed Overall Cognitive Status: Within Functional Limits for tasks assessed                                        General Comments General comments (skin integrity, edema, etc.): Pt on 4L O2 via [place], SaO2 93%O2 throughout, max noted HR 117bpm        Assessment/Plan    PT Assessment Patient needs continued PT services  PT Problem List Decreased strength;Decreased range of motion;Decreased activity tolerance;Decreased balance;Decreased mobility;Decreased coordination;Decreased cognition;Decreased safety awareness;Cardiopulmonary status limiting activity       PT Treatment Interventions DME instruction;Gait training;Functional mobility training;Therapeutic activities;Therapeutic exercise;Balance training;Patient/family education    PT Goals (Current goals can be found in the Care Plan section)  Acute Rehab PT Goals Patient Stated Goal: get back to being active at her facility PT Goal Formulation: With patient Time For Goal Achievement: 04/17/21 Potential to Achieve Goals: Fair    Frequency Min 2X/week    AM-PAC PT "6 Clicks" Mobility  Outcome Measure Help needed turning from your back to your side while in a flat bed without using bedrails?: None Help needed moving from lying on your back to sitting on the  side of a flat bed without using bedrails?: None Help needed moving to and from a bed to a chair (including a wheelchair)?: A Lot Help needed standing up from a chair using your arms (e.g., wheelchair or bedside chair)?: A Little Help needed to walk in hospital room?: Total Help needed climbing 3-5 steps with a railing? : Total 6 Click Score: 15    End of Session Equipment Utilized During Treatment: Gait belt;Oxygen Activity Tolerance: Patient tolerated treatment well Patient left: in chair;with call bell/phone within reach;with chair alarm set;with nursing/sitter in room Nurse Communication: Mobility status PT Visit Diagnosis: Unsteadiness on feet (R26.81);Other abnormalities of gait and mobility (R26.89);Repeated falls (R29.6);Muscle weakness (generalized) (M62.81);History of falling (Z91.81);Difficulty in walking, not elsewhere classified (R26.2)    Time: [id number] PT Time Calculation (min) (ACUTE ONLY): 30 min   Charges:   PT Evaluation $PT Eval Moderate Complexity: 1 Mod          Kalab Camps B. Migdalia Dk PT, DPT Acute Rehabilitation Services Pager [phone] Office [phone]   [place] 04/03/2021, 9:38 AM

## 2021-04-03 NOTE — Plan of Care (Signed)

## 2021-04-03 NOTE — Progress Notes (Signed)
Initial Nutrition Assessment  DOCUMENTATION CODES:  Morbid obesity  INTERVENTION:  Add 30 ml ProSource Plus po BID, each supplement provides 100 kcal and 15 grams of protein.   Add MVI with minerals daily.  NUTRITION DIAGNOSIS:  Increased nutrient needs related to acute illness (acute CHF) as evidenced by estimated needs.  GOAL:  Patient will meet greater than or equal to 90% of their needs  MONITOR:  PO intake, Supplement acceptance, Labs, Weight trends, Skin, I & O's  REASON FOR ASSESSMENT:  Consult Assessment of nutrition requirement/status  ASSESSMENT:  75 yo female with a PMH of CAD, COPD, HTN, HLD, and obesity presenting with SOB. Admitted with acute on chronic dCHF.  Pt ate 100% of breakfast today. Pt reports eating well at home with no changes in appetite or PO intake.  Pt's weight has been steadily increasing over the past four years. This may be due to fluid accumulation or true weight gain, it is difficult to determine. She reports a 50 lb weight gain over the past few months.  No depletions on exam. Severe edema in BLE noted.  Recommend adding ProSource Plus BID and MVI with minerals daily.  Medications: reviewed; oxycodone TID, colace BID, Lasix BID, SSI, Synthroid, Ativan BID, Protonix, miralax, Metamucil, Senokot  Labs: reviewed; CBG 106-185 (H) HbA1c: 6.2% (04/02/2021)  NUTRITION - FOCUSED PHYSICAL EXAM: Flowsheet Row Most Recent Value  Orbital Region No depletion  Upper Arm Region No depletion  Thoracic and Lumbar Region No depletion  Buccal Region No depletion  Temple Region No depletion  Clavicle Bone Region No depletion  Clavicle and Acromion Bone Region No depletion  Scapular Bone Region No depletion  Dorsal Hand No depletion  Patellar Region No depletion  Anterior Thigh Region No depletion  Posterior Calf Region No depletion  Edema (RD Assessment) Severe  [BLE]  Hair Reviewed  Eyes Reviewed  Mouth Reviewed  Skin Reviewed  Nails Reviewed    Diet Order:   Diet Order             Diet heart healthy/carb modified Room service appropriate? Yes; Fluid consistency: Thin; Fluid restriction: 1500 mL Fluid  Diet effective now                  EDUCATION NEEDS:  Education needs have been addressed  Skin:  Skin Assessment: Reviewed RN Assessment  Last BM:  04/01/21  Height:  Ht Readings from Last 1 Encounters:  04/02/21 '5\' 5"'$  (1.651 m)   Weight:  Wt Readings from Last 1 Encounters:  04/03/21 133.5 kg   BMI:  Body mass index is 48.98 kg/m.  Estimated Nutritional Needs:  Kcal:  1850-2050 Protein:  105-120 grams Fluid:  1500 ml fluid restriction  Derrel Nip, RD, LDN (she/her/hers) Registered Dietitian I After-Hours/Weekend Pager # in [place]

## 2021-04-03 NOTE — NC FL2 (Addendum)
[place] MEDICAID FL2 LEVEL OF CARE SCREENING TOOL     IDENTIFICATION  Patient Name: Shirley Leblanc Birthdate: [birth year] Sex: female Admission Date (Current Location): 04/02/2021  [facility] and [place] Number:  Herbalist and Address:  [facility], [place] [address] [place], [place] [address]      Provider Number: [id number]  Attending Physician Name and Address:  Flora Lipps, MD  Relative Name and Phone Number:  Lamar Benes (granddaughter)  phone [phone] ; cell [phone]    Current Level of Care: Hospital Recommended Level of Care: [place] Prior Approval Number:    Date Approved/Denied:   PASRR Number: [id number]  Discharge Plan: SNF    Current Diagnoses: Patient Active Problem List   Diagnosis Date Noted   Acute on chronic diastolic CHF (congestive heart failure) ([place]) 04/02/2021   Stasis dermatitis of both legs 04/02/2021   Dizziness    Tremor 09/18/2017   Encephalopathy    Tremors of nervous system    Near syncope 09/16/2017   Syncope 09/15/2017   CAD (coronary artery disease) 09/15/2017   At HIGH RISK for fall 05/15/2017   Chest pain 04/28/2017   Osteopenia 02/02/2017   Morbid obesity ([place]) 11/13/2016   Elevated troponin 11/13/2016   Chronic back pain 10/10/2016   Pain medication agreement signed 10/10/2016   Opioid dependence ([place]) 10/10/2016   Diabetes ([place]) 09/19/2016   Depression 04/18/2016   Hypothyroid 02/22/2016   Hyperlipidemia associated with type 2 diabetes mellitus ([place]) 02/22/2016   Chronic anticoagulation - Eliquis, CHADS2VASC=3 06/05/2015   Atrial fibrillation ([place]) 05/16/2015   S/P laparoscopic cholecystectomy    Hypertension associated with diabetes ([place]) 05/09/2015   Dyspnea on exertion 03/14/2011    Orientation RESPIRATION BLADDER Height & Weight     Self, Time, Situation, Place  Normal Incontinent, External catheter Weight: 133.5 kg Height:  '5\' 5"'$  (165.1 cm)   BEHAVIORAL SYMPTOMS/MOOD NEUROLOGICAL BOWEL NUTRITION STATUS      Continent Diet (see DC summary)  AMBULATORY STATUS COMMUNICATION OF NEEDS Skin   Extensive Assist Verbally Normal                       Personal Care Assistance Level of Assistance  Bathing, Feeding, Dressing Bathing Assistance: Maximum assistance Feeding assistance: Independent Dressing Assistance: Maximum assistance     Functional Limitations Info  Sight, Hearing, Speech Sight Info: Adequate Hearing Info: Adequate Speech Info: Adequate    SPECIAL CARE FACTORS FREQUENCY  PT (By licensed PT), OT (By licensed OT)     PT Frequency: 5x/week OT Frequency: 5x/week            Contractures Contractures Info: Not present    Additional Factors Info  Code Status, Allergies Code Status Info: DNR Allergies Info: Iodinated Diagnostic Agents, Penicillins, Sulfa Antibiotics, Penicillin G           Current Medications (04/03/2021):  This is the current hospital active medication list Current Facility-Administered Medications  Medication Dose Route Frequency Provider Last Rate Last Admin   (feeding supplement) PROSource Plus liquid 30 mL  30 mL Oral BID BM Pokhrel, Laxman, MD   30 mL at 04/03/21 1202   acetaminophen (TYLENOL) tablet 650 mg  650 mg Oral Q6H PRN Karmen Bongo, MD       Or   acetaminophen (TYLENOL) suppository 650 mg  650 mg Rectal Q6H PRN Karmen Bongo, MD       oxyCODONE (Oxy IR/ROXICODONE) immediate release  tablet 10 mg  10 mg Oral Lynne Logan, MD   10 mg at 04/03/21 1436   And   acetaminophen (TYLENOL) tablet 325 mg  325 mg Oral Lynne Logan, MD   325 mg at 04/03/21 1436   albuterol (PROVENTIL) (2.5 MG/3ML) 0.083% nebulizer solution 2.5 mg  2.5 mg Nebulization Q2H PRN Karmen Bongo, MD       aspirin EC tablet 81 mg  81 mg Oral Daily Karmen Bongo, MD   81 mg at 04/03/21 0943   atorvastatin (LIPITOR) tablet 10 mg  10 mg Oral Daily Karmen Bongo, MD   10 mg at 04/03/21  0943   busPIRone (BUSPAR) tablet 20 mg  20 mg Oral TID Karmen Bongo, MD   20 mg at 04/03/21 0943   carvedilol (COREG) tablet 3.125 mg  3.125 mg Oral BID WC Karmen Bongo, MD   3.125 mg at 04/03/21 0943   docusate sodium (COLACE) capsule 100 mg  100 mg Oral BID Karmen Bongo, MD   100 mg at 04/03/21 0943   [START ON 04/04/2021] enoxaparin (LOVENOX) injection 60 mg  60 mg Subcutaneous Daily Dang, Thuy D, RPH       furosemide (LASIX) injection 40 mg  40 mg Intravenous BID Karmen Bongo, MD   40 mg at 04/03/21 [id number]   hydrALAZINE (APRESOLINE) injection 5 mg  5 mg Intravenous Q4H PRN Karmen Bongo, MD       insulin aspart (novoLOG) injection 0-15 Units  0-15 Units Subcutaneous TID [facility] Karmen Bongo, MD   2 Units at 04/03/21 1202   levothyroxine (SYNTHROID) tablet 75 mcg  75 mcg Oral QAC breakfast Karmen Bongo, MD   75 mcg at 04/03/21 0649   lisinopril (ZESTRIL) tablet 40 mg  40 mg Oral Daily Karmen Bongo, MD   40 mg at 04/03/21 [id number]   LORazepam (ATIVAN) tablet 0.5 mg  0.5 mg Oral BID Karmen Bongo, MD   0.5 mg at 04/03/21 [id number]   multivitamin with minerals tablet 1 tablet  1 tablet Oral Daily Pokhrel, Laxman, MD   1 tablet at 04/03/21 1203   nicotine (NICODERM CQ - dosed in mg/24 hours) patch 14 mg  14 mg Transdermal Daily Karmen Bongo, MD       ondansetron [facility]) tablet 4 mg  4 mg Oral Q6H PRN Karmen Bongo, MD       Or   ondansetron [facility]) injection 4 mg  4 mg Intravenous Q6H PRN Karmen Bongo, MD       pantoprazole (PROTONIX) EC tablet 40 mg  40 mg Oral QPM Karmen Bongo, MD   40 mg at 04/02/21 1816   polyethylene glycol (MIRALAX / GLYCOLAX) packet 17 g  17 g Oral Daily Karmen Bongo, MD   17 g at 04/03/21 0945   psyllium (HYDROCIL/METAMUCIL) 1 packet  1 packet Oral QHS Heloise Purpura, RPH   1 packet at 04/02/21 2055   senna (SENOKOT) tablet 17.2 mg  2 tablet Oral Ivery Quale, MD   17.2 mg at 04/02/21 2055   sertraline (ZOLOFT) tablet 200 mg  200 mg Oral  Daily Karmen Bongo, MD   200 mg at 04/03/21 0943   tamsulosin (FLOMAX) capsule 0.4 mg  0.4 mg Oral Ivery Quale, MD   0.4 mg at 04/02/21 2056   triamcinolone 0.1 % cream : eucerin cream, 1:1   Topical BID Karmen Bongo, MD   Given at 04/03/21 0945     Discharge Medications: Please see discharge summary for a list  of discharge medications.  Relevant Imaging Results:  Relevant Lab Results:   Additional Information [phone]  Zenon Mayo, RN

## 2021-04-03 NOTE — Progress Notes (Signed)
PROGRESS NOTE  Shirley Leblanc [id number] DOB: [birth year] DOA: 04/02/2021 PCP: Reymundo Poll, MD   LOS: 1 day   Brief narrative:  Shirley Leblanc is a 75 y.o. female with past medical history significant for COPD, hypertension, hyperlipidemia and obesity presented to hospital with shortness of breath.  Patient had been having some symptoms for several months now.  She has had recurrent cough treated with antibiotic x3 in the last 6 months and also complained of left lower quadrant abdominal pain and myalgia.  Patient did have history of COVID and pneumonia in the past.  In the ED patient was noted to be hypoxic with pulse ox of saturation 80%.  BNP was elevated.  Chest x-ray showed pulmonary vascular congestion with mildly elevated troponin.  Patient also had mild wheezing and was given duo nebs and was admitted to hospital. . Assessment/Plan:  Principal Problem:   Acute on chronic diastolic CHF (congestive heart failure) (HCC) Active Problems:   Hypertension associated with diabetes ([place])   Atrial fibrillation (HCC)   Hypothyroid   Hyperlipidemia associated with type 2 diabetes mellitus (HCC)   Chronic back pain   Stasis dermatitis of both legs  Acute on chronic diastolic CHF Patient does have history of chronic diastolic heart failure.  Patient presented with worsening shortness of breath weight gain and edema and chest x-ray showed pulm vascular congestion.  Patient received IV diuresis with slightly improved symptoms today.  Had elevated BNP.   2D echocardiogram with LV ejection fraction of 55 to 60%.  Currently on 40 mg of IV Lasix twice a day.  Wean oxygen as able.  Continue lisinopril, Coreg.  Might consider Entresto depending upon echocardiogram.  Patient was supposed to be on torsemide 20 mg daily  Mild elevated troponin.  No chest pain EKG unremarkable.  Likely demand ischemia.  Flat troponins.  Continue low-dose Coreg.    Essential HTN On lisinopril Flomax.  Low-dose Coreg was  added on admission.     Hyperlipidemia Continue Lipitor.   History of atrial fibrillation. -Was not on anticoagulation for unknown reason.  Currently on aspirin 81 mg daily.    Reported history of COPD -On nebulizer.   Mood disorder -Continue Buspar, Ativan, Zoloft   Hypothyroidism -Continue Synthroid   Stasis dermatitis Continue Eucerin, could consider Unna boot if needed   Chronic pain Continue Percocet.   DVT prophylaxis: enoxaparin (LOVENOX) injection 40 mg Start: 04/02/21 1200   Code Status:  DNR  Family Communication: None today.  Status is: Inpatient  Remains inpatient appropriate because:Unsafe d/c plan, IV treatments appropriate due to intensity of illness or inability to take PO, and Inpatient level of care appropriate due to severity of illness  Dispo: The patient is from: ALF              Anticipated d/c is to: ALF              Patient currently is not medically stable to d/c.   Difficult to place patient No   Consultants: None so far  Procedures: None  Anti-infectives:  None  Anti-infectives (From admission, onward)    None      Subjective: Today, patient was seen and examined at bedside.  Today patient feels little better with breathing but he still has shortness of breath and dyspnea.  Patient denies any chest pain, unsure whether she was taking diuretic at the nursing facility.  Objective: Vitals:   04/03/21 0746 04/03/21 1049  BP: 106/64 (!) 90/57  Pulse:  98 89  Resp: (!) 21 20  Temp: 98.5 F (36.9 C) (!) 97.4 F (36.3 C)  SpO2: 92% 97%    Intake/Output Summary (Last 24 hours) at 04/03/2021 1200 Last data filed at 04/03/2021 0819 Gross per 24 hour  Intake 298.74 ml  Output 2550 ml  Net -2251.26 ml   Filed Weights   04/02/21 2100 04/03/21 0103  Weight: 133.6 kg 133.5 kg   Body mass index is 48.98 kg/m.   Physical Exam:  GENERAL: Patient is alert awake and oriented. Not in obvious distress.  Morbidly obese, on nasal  cannula oxygen HENT: No scleral pallor or icterus. Pupils equally reactive to light. Oral mucosa is moist NECK: is supple, no gross swelling noted. CHEST:  Diminished breath sounds bilaterally. CVS: S1 and S2 heard, no murmur. Regular rate and rhythm.  ABDOMEN: Soft, non-tender, bowel sounds are present. EXTREMITIES: Bilateral  lower extremity pitting edema noted.  Mild chronic venous insufficiency noted CNS: Cranial nerves are intact. No focal motor deficits. SKIN: warm and dry without rashes.  Data Review: I have personally reviewed the following laboratory data and studies,  CBC: Recent Labs  Lab 04/02/21 0818 04/03/21 0137  WBC 5.7 3.8*  NEUTROABS 4.0  --   HGB 11.2* 10.8*  HCT 37.4 35.4*  MCV 94.2 92.4  PLT 118* Q000111Q*   Basic Metabolic Panel: Recent Labs  Lab 04/02/21 0818 04/03/21 0137  NA 137 135  K 4.4 4.7  CL 100 96*  CO2 30 34*  GLUCOSE 106* 154*  BUN 17 16  CREATININE 0.86 0.85  CALCIUM 9.2 9.1   Liver Function Tests: Recent Labs  Lab 04/02/21 0818  AST 18  ALT 11  ALKPHOS 83  BILITOT 0.8  PROT 6.4*  ALBUMIN 3.4*   No results for input(s): LIPASE, AMYLASE in the last 168 hours. No results for input(s): AMMONIA in the last 168 hours. Cardiac Enzymes: No results for input(s): CKTOTAL, CKMB, CKMBINDEX, TROPONINI in the last 168 hours. BNP (last 3 results) Recent Labs    09/20/20 1415 04/02/21 0818  BNP 128.0* 236.4*    ProBNP (last 3 results) No results for input(s): PROBNP in the last 8760 hours.  CBG: Recent Labs  Lab 04/02/21 1152 04/02/21 1807 04/02/21 2056 04/03/21 0643 04/03/21 1049  GLUCAP 106* 185* 165* 126* 134*   Recent Results (from the past 240 hour(s))  SARS CORONAVIRUS 2 (TAT 6-24 HRS) Nasopharyngeal Nasopharyngeal Swab     Status: None   Collection Time: 04/02/21 12:24 PM   Specimen: Nasopharyngeal Swab  Result Value Ref Range Status   SARS Coronavirus 2 NEGATIVE NEGATIVE Final    Comment: (NOTE) SARS-CoV-2 target  nucleic acids are NOT DETECTED.  The SARS-CoV-2 RNA is generally detectable in upper and lower respiratory specimens during the acute phase of infection. Negative results do not preclude SARS-CoV-2 infection, do not rule out co-infections with other pathogens, and should not be used as the sole basis for treatment or other patient management decisions. Negative results must be combined with clinical observations, patient history, and epidemiological information. The expected result is Negative.  Fact Sheet for Patients: SugarRoll.be  Fact Sheet for Healthcare Providers: https://www.woods-mathews.com/  This test is not yet approved or cleared by the [place] FDA and  has been authorized for detection and/or diagnosis of SARS-CoV-2 by FDA under an Emergency Use Authorization (EUA). This EUA will remain  in effect (meaning this test can be used) for the duration of the COVID-19 declaration under Se ction 564(b)(1) of  the Act, 21 U.S.C. section 360bbb-3(b)(1), unless the authorization is terminated or revoked sooner.  Performed at [facility], [place] [address], [place], [place] [address]      Studies: DG Chest Portable 1 View  Result Date: 04/02/2021 CLINICAL DATA:  Shortness of breath for 2 weeks, history of COPD EXAM: PORTABLE CHEST 1 VIEW COMPARISON:  Chest radiograph 12/25/2020 FINDINGS: The heart is mildly enlarged, unchanged. The mediastinal contours are within normal limits. Lung volumes are low. There is vascular congestion without overt pulmonary edema. Linear opacities in the left mid lung likely reflect subsegmental atelectasis or scar. There is no other focal airspace disease. There is no significant pleural effusion. There is no pneumothorax. Cervical spine fusion hardware and spinal stimulator are noted. There is no acute osseous abnormality. IMPRESSION: Unchanged cardiomegaly with vascular congestion but no overt pulmonary  edema. Electronically Signed   By: Valetta Mole M.D.   On: 04/02/2021 08:19   ECHOCARDIOGRAM COMPLETE  Result Date: 04/02/2021    ECHOCARDIOGRAM REPORT   Patient Name:   MICA VILL Bodnar Date of Exam: 04/02/2021 Medical Rec #:  [id number]     Height:       66.0 in Accession #:    [id number]    Weight:       285.0 lb Date of Birth:  [birth year]      BSA:          2.325 m Patient Age:    2 years      BP:           130/63 mmHg Patient Gender: F             HR:           74 bpm. Exam Location:  Inpatient Procedure: 2D Echo Indications:    acute diastolic chf  History:        Patient has prior history of Echocardiogram examinations, most                 recent 09/16/2017. CAD, Arrythmias:Atrial Fibrillation; Risk                 Factors:Diabetes, Hypertension and Dyslipidemia.  Sonographer:    Johny Chess RDCS Referring Phys: [place]  1. Left ventricular ejection fraction, by estimation, is 55 to 60%. The left ventricle has normal function. The left ventricle has no regional wall motion abnormalities. Left ventricular diastolic function could not be evaluated.  2. Right ventricular systolic function is mildly reduced. The right ventricular size is mildly enlarged. There is mildly elevated pulmonary artery systolic pressure.  3. Right atrial size was mildly dilated.  4. The mitral valve is grossly normal. Mild to moderate mitral valve regurgitation.  5. Tricuspid valve regurgitation is moderate to severe.  6. The aortic valve is tricuspid. Aortic valve regurgitation is not visualized. FINDINGS  Left Ventricle: Left ventricular ejection fraction, by estimation, is 55 to 60%. The left ventricle has normal function. The left ventricle has no regional wall motion abnormalities. The left ventricular internal cavity size was normal in size. There is  no left ventricular hypertrophy. Left ventricular diastolic function could not be evaluated due to atrial fibrillation. Left ventricular diastolic function  could not be evaluated. Right Ventricle: The right ventricular size is mildly enlarged. Right vetricular wall thickness was not well visualized. Right ventricular systolic function is mildly reduced. There is mildly elevated pulmonary artery systolic pressure. The tricuspid regurgitant velocity is 2.76 m/s, and with an assumed right  atrial pressure of 10 mmHg, the estimated right ventricular systolic pressure is Q000111Q mmHg. Left Atrium: Left atrial size was normal in size. Right Atrium: Right atrial size was mildly dilated. Pericardium: There is no evidence of pericardial effusion. Mitral Valve: The mitral valve is grossly normal. Mild to moderate mitral valve regurgitation. Tricuspid Valve: The tricuspid valve is grossly normal. Tricuspid valve regurgitation is moderate to severe. Aortic Valve: The aortic valve is tricuspid. Aortic valve regurgitation is not visualized. Pulmonic Valve: The pulmonic valve was grossly normal. Pulmonic valve regurgitation is not visualized. No evidence of pulmonic stenosis. Aorta: The aortic root and ascending aorta are structurally normal, with no evidence of dilitation. IAS/Shunts: The atrial septum is grossly normal.  LEFT VENTRICLE PLAX 2D LVIDd:         5.10 cm LVIDs:         3.70 cm LV PW:         1.10 cm LV IVS:        1.00 cm LVOT diam:     2.00 cm LV SV:         56 LV SV Index:   24 LVOT Area:     3.14 cm  RIGHT VENTRICLE         IVC TAPSE (M-mode): 1.6 cm  IVC diam: 2.80 cm LEFT ATRIUM             Index       RIGHT ATRIUM           Index LA diam:        5.00 cm 2.15 cm/m  RA Area:     19.00 cm LA Vol (A2C):   83.2 ml 35.79 ml/m RA Volume:   54.00 ml  23.23 ml/m LA Vol (A4C):   70.7 ml 30.41 ml/m LA Biplane Vol: 77.8 ml 33.47 ml/m  AORTIC VALVE LVOT Vmax:   97.40 cm/s LVOT Vmean:  65.600 cm/s LVOT VTI:    0.177 m  AORTA Ao Root diam: 2.70 cm TRICUSPID VALVE TR Peak grad:   30.5 mmHg TR Vmax:        276.00 cm/s  SHUNTS Systemic VTI:  0.18 m Systemic Diam: 2.00 cm Mertie Moores MD Electronically signed by Mertie Moores MD Signature Date/Time: 04/02/2021/2:31:34 PM    Final       Flora Lipps, MD  [facility] 04/03/2021  If 7PM-7AM, please contact night-coverage

## 2021-04-04 LAB — BASIC METABOLIC PANEL
Anion gap: 6 (ref 5–15)
BUN: 22 mg/dL (ref 8–23)
CO2: 35 mmol/L — ABNORMAL HIGH (ref 22–32)
Calcium: 9.5 mg/dL (ref 8.9–10.3)
Chloride: 95 mmol/L — ABNORMAL LOW (ref 98–111)
Creatinine, Ser: 1 mg/dL (ref 0.44–1.00)
GFR, Estimated: 59 mL/min — ABNORMAL LOW (ref >60)
Glucose, Bld: 111 mg/dL — ABNORMAL HIGH (ref 70–99)
Potassium: 4.2 mmol/L (ref 3.5–5.1)
Sodium: 136 mmol/L (ref 135–145)

## 2021-04-04 LAB — PHOSPHORUS: Phosphorus: 3.4 mg/dL (ref 2.5–4.6)

## 2021-04-04 LAB — GLUCOSE, CAPILLARY
Glucose-Capillary: 98 mg/dL (ref 70–99)
Glucose-Capillary: 118 mg/dL — ABNORMAL HIGH (ref 70–99)
Glucose-Capillary: 108 mg/dL — ABNORMAL HIGH (ref 70–99)
Glucose-Capillary: 120 mg/dL — ABNORMAL HIGH (ref 70–99)

## 2021-04-04 LAB — CBC
HCT: 35.7 % — ABNORMAL LOW (ref 36.0–46.0)
Hemoglobin: 10.5 g/dL — ABNORMAL LOW (ref 12.0–15.0)
MCH: 27.7 pg (ref 26.0–34.0)
MCHC: 29.4 g/dL — ABNORMAL LOW (ref 30.0–36.0)
MCV: 94.2 fL (ref 80.0–100.0)
Platelets: 127 10*3/uL — ABNORMAL LOW (ref 150–400)
RBC: 3.79 MIL/uL — ABNORMAL LOW (ref 3.87–5.11)
RDW: 15.8 % — ABNORMAL HIGH (ref 11.5–15.5)
WBC: 6.3 10*3/uL (ref 4.0–10.5)
nRBC: 0 % (ref 0.0–0.2)

## 2021-04-04 LAB — MAGNESIUM: Magnesium: 1.7 mg/dL (ref 1.7–2.4)

## 2021-04-04 MED ORDER — POLYETHYLENE GLYCOL 3350 17 G PO PACK
17.0000 g | PACK | Freq: Two times a day (BID) | ORAL | Status: DC
  Administered 2021-04-06: 17 g via ORAL
  Filled 2021-04-04 (×6): qty 1

## 2021-04-04 NOTE — TOC Progression Note (Signed)
Transition of Care [facility]) - Progression Note    Patient Details  Name: Shirley Leblanc MRN: [id number] Date of Birth: 10-Sep-1945  Transition of Care [facility]) CM/SW Contact  Reece Agar, [place] Phone Number: 04/04/2021, 10:23 AM  Clinical Narrative:    CSW spoke with pt POA/granddaughter Christy this morning to discuss pt DC plan. Alyse Low states that pt kids would rather pt return to [place] with a large wheelchair until they can get an IT trainer one through insurance. Pt family does not think a SNF will help pt bc of past experience. CSW will follow up with the facility to confirm pt DC.   Expected Discharge Plan: [place] Barriers to Discharge: Continued Medical Work up  Expected Discharge Plan and Services Expected Discharge Plan: [place] In-house Referral: Clinical Social Work Discharge Planning Services: NA Post Acute Care Choice: [place] Living arrangements for the past 2 months: [place]                                       Social Determinants of Health (SDOH) Interventions    Readmission Risk Interventions No flowsheet data found.

## 2021-04-04 NOTE — Progress Notes (Signed)
PROGRESS NOTE  Shirley Leblanc [id number] DOB: 17-Nov-1945 DOA: 04/02/2021 PCP: Reymundo Poll, MD   LOS: 2 days   Brief narrative:  Shirley Leblanc is a 75 y.o. female with past medical history significant for COPD, hypertension, hyperlipidemia and obesity presented to hospital with shortness of breath.  Patient had been having some symptoms for several months now.  She has had recurrent cough treated with antibiotic x3 in the last 6 months and also complained of left lower quadrant abdominal pain and myalgia.  Patient did have history of COVID and pneumonia in the past.  In the ED patient was noted to be hypoxic with pulse ox of saturation 80%.  BNP was elevated.  Chest x-ray showed pulmonary vascular congestion with mildly elevated troponin.  Patient also had mild wheezing and was given duo nebs and was admitted to hospital. . Assessment/Plan:  Principal Problem:   Acute on chronic diastolic CHF (congestive heart failure) (HCC) Active Problems:   Hypertension associated with diabetes ([place])   Atrial fibrillation (HCC)   Hypothyroid   Hyperlipidemia associated with type 2 diabetes mellitus (HCC)   Chronic back pain   Stasis dermatitis of both legs  Acute on chronic diastolic CHF Patient does have history of chronic diastolic heart failure.  Patient presented with worsening shortness of breath weight gain and edema and chest x-ray showed pulm vascular congestion.  Patient received IV diuresis with slightly improved symptoms today.  Had elevated BNP.   2D echocardiogram with LV ejection fraction of 55 to 60%.  Currently on 40 mg of IV Lasix twice a day.  Wean oxygen as able.  Continue lisinopril, Coreg.  2D echocardiogram with LV ejection fraction of 55 to 60%.  Patient was supposed to be on torsemide 20 mg daily at home.  Mild elevated troponin.  No chest pain. EKG unremarkable.  Likely demand ischemia.  Flat troponins.  Continue low-dose Coreg.    Essential HTN On lisinopril, Flomax.   Low-dose Coreg was added on admission.  Blood pressure stable at this time.   Hyperlipidemia Continue Lipitor.   History of atrial fibrillation. Was not on anticoagulation for unknown reason.  Currently on aspirin 81 mg daily.    Reported history of COPD On nebulizer.   Mood disorder Continue Buspar, Ativan, Zoloft   Hypothyroidism -Continue Synthroid   Stasis dermatitis Continue Eucerin, could consider Unna boot if needed   Chronic pain Continue Percocet.   DVT prophylaxis:   Lovenox subcu  Code Status:  DNR  Family Communication:. None today  Status is: Inpatient  Remains inpatient appropriate because:Unsafe d/c plan, IV treatments appropriate due to intensity of illness or inability to take PO, and Inpatient level of care appropriate due to severity of illness  Dispo: The patient is from: ALF              Anticipated d/c is to: ALF              Patient currently is not medically stable to d/c.   Difficult to place patient No   Consultants: None so far  Procedures: None  Anti-infectives:  None  Anti-infectives (From admission, onward)    None      Subjective: Today, patient was seen and examined at bedside.  Patient denies any dyspnea or PND today.  Feels little better with breathing.  Denies any chest pain dizziness.  Still shortness of breath.  Has not had a bowel movement in few days  Objective: Vitals:   04/04/21 0830 04/04/21  1149  BP: (!) 107/42 (!) 110/55  Pulse: 62 76  Resp: 18 18  Temp: 97.7 F (36.5 C)   SpO2: 96% 93%    Intake/Output Summary (Last 24 hours) at 04/04/2021 1229 Last data filed at 04/04/2021 0036 Gross per 24 hour  Intake 477 ml  Output 1900 ml  Net -1423 ml    Filed Weights   04/02/21 2100 04/03/21 0103 04/04/21 0534  Weight: 133.6 kg 133.5 kg 132.4 kg   Body mass index is 48.57 kg/m.   Physical Exam:  GENERAL: Patient is alert awake and oriented. Not in obvious distress.  Morbidly obese, on nasal cannula  oxygen. HENT: No scleral pallor or icterus. Pupils equally reactive to light. Oral mucosa is moist NECK: is supple, no gross swelling noted. CHEST:  Diminished breath sounds bilaterally. CVS: S1 and S2 heard, no murmur. Regular rate and rhythm.  ABDOMEN: Soft, non-tender, bowel sounds are present. EXTREMITIES: Bilateral lower extremity edema noted.  Mild chronic venous insufficiency noted CNS: Cranial nerves are intact. No focal motor deficits. SKIN: warm and dry without rashes.  Data Review: I have personally reviewed the following laboratory data and studies,  CBC: Recent Labs  Lab 04/02/21 0818 04/03/21 0137 04/04/21 0327  WBC 5.7 3.8* 6.3  NEUTROABS 4.0  --   --   HGB 11.2* 10.8* 10.5*  HCT 37.4 35.4* 35.7*  MCV 94.2 92.4 94.2  PLT 118* 133* 127*    Basic Metabolic Panel: Recent Labs  Lab 04/02/21 0818 04/03/21 0137 04/04/21 0327  NA 137 135 136  K 4.4 4.7 4.2  CL 100 96* 95*  CO2 30 34* 35*  GLUCOSE 106* 154* 111*  BUN '17 16 22  '$ CREATININE 0.86 0.85 1.00  CALCIUM 9.2 9.1 9.5  MG  --   --  1.7  PHOS  --   --  3.4    Liver Function Tests: Recent Labs  Lab 04/02/21 0818  AST 18  ALT 11  ALKPHOS 83  BILITOT 0.8  PROT 6.4*  ALBUMIN 3.4*    No results for input(s): LIPASE, AMYLASE in the last 168 hours. No results for input(s): AMMONIA in the last 168 hours. Cardiac Enzymes: No results for input(s): CKTOTAL, CKMB, CKMBINDEX, TROPONINI in the last 168 hours. BNP (last 3 results) Recent Labs    09/20/20 1415 04/02/21 0818  BNP 128.0* 236.4*     ProBNP (last 3 results) No results for input(s): PROBNP in the last 8760 hours.  CBG: Recent Labs  Lab 04/03/21 1049 04/03/21 1603 04/03/21 2100 04/04/21 0616 04/04/21 1158  GLUCAP 134* 106* 116* 98 118*    Recent Results (from the past 240 hour(s))  SARS CORONAVIRUS 2 (TAT 6-24 HRS) Nasopharyngeal Nasopharyngeal Swab     Status: None   Collection Time: 04/02/21 12:24 PM   Specimen:  Nasopharyngeal Swab  Result Value Ref Range Status   SARS Coronavirus 2 NEGATIVE NEGATIVE Final    Comment: (NOTE) SARS-CoV-2 target nucleic acids are NOT DETECTED.  The SARS-CoV-2 RNA is generally detectable in upper and lower respiratory specimens during the acute phase of infection. Negative results do not preclude SARS-CoV-2 infection, do not rule out co-infections with other pathogens, and should not be used as the sole basis for treatment or other patient management decisions. Negative results must be combined with clinical observations, patient history, and epidemiological information. The expected result is Negative.  Fact Sheet for Patients: SugarRoll.be  Fact Sheet for Healthcare Providers: https://www.woods-mathews.com/  This test is not yet approved or  cleared by the [place] and  has been authorized for detection and/or diagnosis of SARS-CoV-2 by FDA under an Emergency Use Authorization (EUA). This EUA will remain  in effect (meaning this test can be used) for the duration of the COVID-19 declaration under Se ction 564(b)(1) of the Act, 21 U.S.C. section 360bbb-3(b)(1), unless the authorization is terminated or revoked sooner.  Performed at [facility], [place] [address] [place], [place] [address]       Studies: ECHOCARDIOGRAM COMPLETE  Result Date: 04/02/2021    ECHOCARDIOGRAM REPORT   Patient Name:   Shirley Leblanc Holts Date of Exam: 04/02/2021 Medical Rec #:  [id number]     Height:       66.0 in Accession #:    [id number]    Weight:       285.0 lb Date of Birth:  January 10, 1946      BSA:          2.325 m Patient Age:    78 years      BP:           130/63 mmHg Patient Gender: F             HR:           74 bpm. Exam Location:  Inpatient Procedure: 2D Echo Indications:    acute diastolic chf  History:        Patient has prior history of Echocardiogram examinations, most                 recent 09/16/2017. CAD, Arrythmias:Atrial  Fibrillation; Risk                 Factors:Diabetes, Hypertension and Dyslipidemia.  Sonographer:    Johny Chess RDCS Referring Phys: [place]  1. Left ventricular ejection fraction, by estimation, is 55 to 60%. The left ventricle has normal function. The left ventricle has no regional wall motion abnormalities. Left ventricular diastolic function could not be evaluated.  2. Right ventricular systolic function is mildly reduced. The right ventricular size is mildly enlarged. There is mildly elevated pulmonary artery systolic pressure.  3. Right atrial size was mildly dilated.  4. The mitral valve is grossly normal. Mild to moderate mitral valve regurgitation.  5. Tricuspid valve regurgitation is moderate to severe.  6. The aortic valve is tricuspid. Aortic valve regurgitation is not visualized. FINDINGS  Left Ventricle: Left ventricular ejection fraction, by estimation, is 55 to 60%. The left ventricle has normal function. The left ventricle has no regional wall motion abnormalities. The left ventricular internal cavity size was normal in size. There is  no left ventricular hypertrophy. Left ventricular diastolic function could not be evaluated due to atrial fibrillation. Left ventricular diastolic function could not be evaluated. Right Ventricle: The right ventricular size is mildly enlarged. Right vetricular wall thickness was not well visualized. Right ventricular systolic function is mildly reduced. There is mildly elevated pulmonary artery systolic pressure. The tricuspid regurgitant velocity is 2.76 m/s, and with an assumed right atrial pressure of 10 mmHg, the estimated right ventricular systolic pressure is Q000111Q mmHg. Left Atrium: Left atrial size was normal in size. Right Atrium: Right atrial size was mildly dilated. Pericardium: There is no evidence of pericardial effusion. Mitral Valve: The mitral valve is grossly normal. Mild to moderate mitral valve regurgitation. Tricuspid  Valve: The tricuspid valve is grossly normal. Tricuspid valve regurgitation is moderate to severe. Aortic Valve: The aortic valve is tricuspid. Aortic valve regurgitation  is not visualized. Pulmonic Valve: The pulmonic valve was grossly normal. Pulmonic valve regurgitation is not visualized. No evidence of pulmonic stenosis. Aorta: The aortic root and ascending aorta are structurally normal, with no evidence of dilitation. IAS/Shunts: The atrial septum is grossly normal.  LEFT VENTRICLE PLAX 2D LVIDd:         5.10 cm LVIDs:         3.70 cm LV PW:         1.10 cm LV IVS:        1.00 cm LVOT diam:     2.00 cm LV SV:         56 LV SV Index:   24 LVOT Area:     3.14 cm  RIGHT VENTRICLE         IVC TAPSE (M-mode): 1.6 cm  IVC diam: 2.80 cm LEFT ATRIUM             Index       RIGHT ATRIUM           Index LA diam:        5.00 cm 2.15 cm/m  RA Area:     19.00 cm LA Vol (A2C):   83.2 ml 35.79 ml/m RA Volume:   54.00 ml  23.23 ml/m LA Vol (A4C):   70.7 ml 30.41 ml/m LA Biplane Vol: 77.8 ml 33.47 ml/m  AORTIC VALVE LVOT Vmax:   97.40 cm/s LVOT Vmean:  65.600 cm/s LVOT VTI:    0.177 m  AORTA Ao Root diam: 2.70 cm TRICUSPID VALVE TR Peak grad:   30.5 mmHg TR Vmax:        276.00 cm/s  SHUNTS Systemic VTI:  0.18 m Systemic Diam: 2.00 cm Mertie Moores MD Electronically signed by Mertie Moores MD Signature Date/Time: 04/02/2021/2:31:34 PM    Final       Flora Lipps, MD  [facility] 04/04/2021  If 7PM-7AM, please contact night-coverage

## 2021-04-05 LAB — CBC
HCT: 34.8 % — ABNORMAL LOW (ref 36.0–46.0)
Hemoglobin: 10.2 g/dL — ABNORMAL LOW (ref 12.0–15.0)
MCH: 27.7 pg (ref 26.0–34.0)
MCHC: 29.3 g/dL — ABNORMAL LOW (ref 30.0–36.0)
MCV: 94.6 fL (ref 80.0–100.0)
Platelets: 125 10*3/uL — ABNORMAL LOW (ref 150–400)
RBC: 3.68 MIL/uL — ABNORMAL LOW (ref 3.87–5.11)
RDW: 15.9 % — ABNORMAL HIGH (ref 11.5–15.5)
WBC: 5.2 10*3/uL (ref 4.0–10.5)
nRBC: 0 % (ref 0.0–0.2)

## 2021-04-05 LAB — GLUCOSE, CAPILLARY
Glucose-Capillary: 103 mg/dL — ABNORMAL HIGH (ref 70–99)
Glucose-Capillary: 111 mg/dL — ABNORMAL HIGH (ref 70–99)
Glucose-Capillary: 121 mg/dL — ABNORMAL HIGH (ref 70–99)
Glucose-Capillary: 119 mg/dL — ABNORMAL HIGH (ref 70–99)

## 2021-04-05 LAB — BASIC METABOLIC PANEL
Anion gap: 4 — ABNORMAL LOW (ref 5–15)
BUN: 25 mg/dL — ABNORMAL HIGH (ref 8–23)
CO2: 37 mmol/L — ABNORMAL HIGH (ref 22–32)
Calcium: 9 mg/dL (ref 8.9–10.3)
Chloride: 96 mmol/L — ABNORMAL LOW (ref 98–111)
Creatinine, Ser: 0.87 mg/dL (ref 0.44–1.00)
GFR, Estimated: >60 mL/min (ref >60)
Glucose, Bld: 104 mg/dL — ABNORMAL HIGH (ref 70–99)
Potassium: 4.2 mmol/L (ref 3.5–5.1)
Sodium: 137 mmol/L (ref 135–145)

## 2021-04-05 LAB — MAGNESIUM: Magnesium: 1.7 mg/dL (ref 1.7–2.4)

## 2021-04-05 LAB — PHOSPHORUS: Phosphorus: 3.9 mg/dL (ref 2.5–4.6)

## 2021-04-05 MED ORDER — SALINE SPRAY 0.65 % NA SOLN
1.0000 | NASAL | Status: DC | PRN
  Filled 2021-04-05: qty 44

## 2021-04-05 MED ORDER — METOLAZONE 5 MG PO TABS
5.0000 mg | ORAL_TABLET | Freq: Once | ORAL | Status: AC
  Administered 2021-04-05: 5 mg via ORAL
  Filled 2021-04-05: qty 1

## 2021-04-05 NOTE — Progress Notes (Signed)
Occupational Therapy Treatment Patient Details Name: Shirley Leblanc MRN: [id number] DOB: 1945-09-24 Today's Date: 04/05/2021    History of present illness 75 y.o. female presenting with SOB and chest pains and inability to support her weight with her Rollator.   She has had recurrent cough and has been treated with antibiotics x 3 in the last 6 months.  She also has chronic LLQ abdominal pain and myalgias. Lives at [place] and presents to ED 04/02/21 at suggestion of facility RN. In ED found to have hypoxia in the 80%SaO2 rebounds with 2L O2 via   BNP mildly elevated, CXR with pulm congestion, mildly increased troponin.  She is in afib, off AC.  PMH:  CAD; COPD; HTN; HLD; and obesity   OT comments  Pt progressing well towards OT goals and remains motivated to participate. Pt able to progress to Supervision for [facility] transfers using RW with improved stability noted. Pt overall Min A for toileting task due to fatigue. Guided pt in AE education for LB ADLs with pt able to return demo at [place] A for brief mgmt with pt preferring reacher over dressing stick for this task. Pt's daughter and granddaughter present and engaged throughout session. Encouraged trial of leg lifter to maximize ability to effectively lift B LE on scooter at ALF.  Noted ongoing [place] discussions with pt/family. If pt continues to progress, may be appropriate to DC to ALF with HHOT follow-up. Plan to assess bathroom mobility in next session prior to updating recommendations.    Follow Up Recommendations  SNF;Supervision - Intermittent;Other (comment) (progressing towards HHOT follow-up at ALF)    Equipment Recommendations  None recommended by OT    Recommendations for Other Services      Precautions / Restrictions Precautions Precautions: Fall Precaution Comments: fell in bathroom 2 wks ago and fell off bed 1 wk ago; monitor O2 - does not wear at baseline Restrictions Weight Bearing Restrictions: No       Mobility Bed  Mobility Overal bed mobility: Needs Assistance Bed Mobility: Supine to Sit;Sit to Supine     Supine to sit: HOB elevated;Supervision Sit to supine: Supervision;HOB elevated   General bed mobility comments: no physical assist needed, HOB elevated and light use of bedrails    Transfers Overall transfer level: Needs assistance Equipment used: Rolling walker (2 wheeled) Transfers: Sit to/from [facility] Sit to Stand: Supervision Stand pivot transfers: Min guard       General transfer comment: min guard at most progressing to supervision for transfer to/from [facility] using RW. improving stability    Balance Overall balance assessment: Needs assistance Sitting-balance support: Feet supported;No upper extremity supported Sitting balance-Leahy Scale: Good     Standing balance support: Bilateral upper extremity supported Standing balance-Leahy Scale: Fair Standing balance comment: fair static standing for clothing mgmt, use of BUE support for transfers                           ADL either performed or assessed with clinical judgement   ADL Overall ADL's : Needs assistance/impaired                     Lower Body Dressing: Minimal assistance;Sit to/from stand;With adaptive equipment Lower Body Dressing Details (indicate cue type and reason): Total A for socks though does not wear at baseline. Min A for donning of pull up brief using reacher then dressing stick after education. Pt reports preference and increased ease with  reacher use for task Toilet Transfer: Min Geophysical data processor Details (indicate cue type and reason): progressing to supervision, decreased shakiness. no cues for safety needed Toileting- Clothing Manipulation and Hygiene: Minimal assistance;Sit to/from stand Toileting - Clothing Manipulation Details (indicate cue type and reason): Min A for thoroughness of posterior hygiene in standing due to fatigue. able to  manage brief and anterior peri hygiene       General ADL Comments: Pt progressing well today, able to return demo use of AE well (at times limited due to [facility] with education), decreased assist for transfers and able to stand unsupported to manage clothing for toileting task     Vision   Vision Assessment?: No apparent visual deficits   Perception     Praxis      Cognition Arousal/Alertness: Awake/alert Behavior During Therapy: WFL for tasks assessed/performed Overall Cognitive Status: Within Functional Limits for tasks assessed                                          Exercises     Shoulder Instructions       General Comments daughter and granddaughter present and supportive. Unsure of O2 reading on RA (initially 91% flat in bed with dynamap though reading 80% after session with personal pulse ox- pt denies SOB). With placement of 2 L O2, SpO2 100%, titrated to 1 L O2    Pertinent Vitals/ Pain       Pain Assessment: No/denies pain  Home Living                                          Prior Functioning/Environment              Frequency  Min 2X/week        Progress Toward Goals  OT Goals(current goals can now be found in the care plan section)  Progress towards OT goals: Progressing toward goals  Acute Rehab OT Goals Patient Stated Goal: get back to being active at her facility OT Goal Formulation: With patient Time For Goal Achievement: 04/17/21 Potential to Achieve Goals: Good ADL Goals Pt Will Perform Grooming: with modified independence;standing Pt Will Perform Lower Body Bathing: with min assist;sit to/from stand;sitting/lateral leans;with adaptive equipment Pt Will Perform Lower Body Dressing: with min assist;sitting/lateral leans;sit to/from stand;with adaptive equipment Pt Will Transfer to Toilet: with supervision;stand pivot transfer;bedside commode Pt/caregiver will Perform Home Exercise Program: Increased  strength;Both right and left upper extremity;With theraband;Independently;With written HEP provided  Plan Discharge plan remains appropriate    Co-evaluation                 AM-PAC OT "6 Clicks" Daily Activity     Outcome Measure   Help from another person eating meals?: None Help from another person taking care of personal grooming?: A Little Help from another person toileting, which includes using toliet, bedpan, or urinal?: A Little Help from another person bathing (including washing, rinsing, drying)?: A Lot Help from another person to put on and taking off regular upper body clothing?: A Little Help from another person to put on and taking off regular lower body clothing?: A Little 6 Click Score: 18    End of Session Equipment Utilized During Treatment: Gait belt;Rolling walker;Oxygen  OT Visit Diagnosis: Unsteadiness on feet (  R26.81);Other abnormalities of gait and mobility (R26.89);Muscle weakness (generalized) (M62.81);History of falling (Z91.81)   Activity Tolerance Patient tolerated treatment well   Patient Left in bed;with call bell/phone within reach;with family/visitor present   Nurse Communication Mobility status        Time: [id number] OT Time Calculation (min): 35 min  Charges: OT General Charges $OT Visit: 1 Visit OT Treatments $Self Care/Home Management : 23-37 mins  Malachy Chamber, OTR/L Acute Rehab Services Office: [phone]    Layla Maw 04/05/2021, 1:14 PM

## 2021-04-05 NOTE — Progress Notes (Signed)
PROGRESS NOTE  Shirley Leblanc [id number] DOB: 20-Nov-1945 DOA: 04/02/2021 PCP: Reymundo Poll, MD   LOS: 3 days   Brief narrative:  Shirley Leblanc is a 75 y.o. female with past medical history significant for COPD, hypertension, hyperlipidemia and obesity presented to hospital with shortness of breath.  Patient had been having some symptoms for several months now.  She has had recurrent cough treated with antibiotic x3 in the last 6 months and also complained of left lower quadrant abdominal pain and myalgia.  Patient did have history of COVID and pneumonia in the past.  In the ED, patient was noted to be hypoxic with pulse ox of saturation 80%.  BNP was elevated.  Chest x-ray showed pulmonary vascular congestion with mildly elevated troponin.  Patient also had mild wheezing and was given duo nebs and was admitted to the hospital. . Assessment/Plan:  Principal Problem:   Acute on chronic diastolic CHF (congestive heart failure) (HCC) Active Problems:   Hypertension associated with diabetes ([place])   Atrial fibrillation (HCC)   Hypothyroid   Hyperlipidemia associated with type 2 diabetes mellitus (HCC)   Chronic back pain   Stasis dermatitis of both legs  Acute on chronic diastolic CHF Gradually improving.  Patient has been receiving significant diuresis on 40 mg of Lasix IV twice daily.  Still appears to be volume overloaded.   Wean oxygen as able.  2D echocardiogram with LV ejection fraction of 55 to 60%. Continue lisinopril, Coreg.  2D echocardiogram with LV ejection fraction of 55 to 60%.  Patient was supposed to be on torsemide 20 mg daily at home. Add zaroxolyn today.   Mild elevated troponin.  No chest pain. EKG unremarkable.  Likely demand ischemia.  Flat troponins.  Continue low-dose Coreg.    Essential HTN On lisinopril, Flomax.  Low-dose Coreg was added on admission.  Blood pressure stable at this time.  Latest blood pressure of 140/73   Hyperlipidemia Continue Lipitor.    History of atrial fibrillation. Was not on anticoagulation for unknown reason.  Currently on aspirin 81 mg daily.    Reported history of COPD On nebulizer.  No active wheezing.   Mood disorder Continue Buspar, Ativan, Zoloft   Hypothyroidism -Continue Synthroid   Stasis dermatitis Continue Eucerin, could consider [place] boot if needed   Chronic pain Complains of back and left shoulder pain Continue Percocet.  Add warm compress.  Generalized weakness, debility. PT on board. Granddaughter stated that she wishes hospice evaluation.  Goals of care. DNR. Granddaughter wishes hospice evaluation.  DVT prophylaxis:   Lovenox subcu  Code Status:  DNR  Family Communication:. I spoke with the granddaguthter at bedside. She wishes hospice evaluation. TOC consult done  None today  Status is: Inpatient  Remains inpatient appropriate because:Unsafe d/c plan, IV treatments appropriate due to intensity of illness or inability to take PO, and Inpatient level of care appropriate due to severity of illness  Dispo: The patient is from: ALF              Anticipated d/c is to: ALF/SNF with hospice.              Patient currently is not medically stable to d/c.   Difficult to place patient No   Consultants: None  Procedures: None  Anti-infectives:  None  Anti-infectives (From admission, onward)    None      Subjective: Today, patient was seen and examined at bedside.  Complains of back and left shoulder pain, states that  fluid in the body has not gone down much. Eating ok. Bowel movements ok.  Breathing better now.  Objective: Vitals:   04/04/21 2016 04/05/21 0516  BP: 103/66 140/73  Pulse: 78 77  Resp: 20 20  Temp: (!) 97.5 F (36.4 C) (!) 97.4 F (36.3 C)  SpO2: 95% 92%    Intake/Output Summary (Last 24 hours) at 04/05/2021 0812 Last data filed at 04/05/2021 0000 Gross per 24 hour  Intake 460 ml  Output 1850 ml  Net -1390 ml    Filed Weights   04/03/21 0103  04/04/21 0534 04/05/21 0516  Weight: 133.5 kg 132.4 kg 131.1 kg   Body mass index is 48.11 kg/m.   Physical Exam:  GENERAL: Patient is alert awake and oriented. Not in obvious distress.  Morbidly obese, on nasal canula. HENT: No scleral pallor or icterus. Pupils equally reactive to light. Oral mucosa is moist NECK: is supple, no gross swelling noted. CHEST:  Diminished breath sounds bilaterally., no crackles. CVS: S1 and S2 heard, no murmur. Regular rate and rhythm.  ABDOMEN: Soft, non-tender, bowel sounds are present. EXTREMITIES: Bilateral lower extremity edema noted.  Mild chronic venous insufficiency noted CNS: Cranial nerves are intact. No focal motor deficits. SKIN: warm and dry without rashes.  Data Review: I have personally reviewed the following laboratory data and studies,  CBC: Recent Labs  Lab 04/02/21 0818 04/03/21 0137 04/04/21 0327 04/05/21 0356  WBC 5.7 3.8* 6.3 5.2  NEUTROABS 4.0  --   --   --   HGB 11.2* 10.8* 10.5* 10.2*  HCT 37.4 35.4* 35.7* 34.8*  MCV 94.2 92.4 94.2 94.6  PLT 118* 133* 127* 125*    Basic Metabolic Panel: Recent Labs  Lab 04/02/21 0818 04/03/21 0137 04/04/21 0327 04/05/21 0356  NA 137 135 136 137  K 4.4 4.7 4.2 4.2  CL 100 96* 95* 96*  CO2 30 34* 35* 37*  GLUCOSE 106* 154* 111* 104*  BUN '17 16 22 '$ 25*  CREATININE 0.86 0.85 1.00 0.87  CALCIUM 9.2 9.1 9.5 9.0  MG  --   --  1.7 1.7  PHOS  --   --  3.4 3.9    Liver Function Tests: Recent Labs  Lab 04/02/21 0818  AST 18  ALT 11  ALKPHOS 83  BILITOT 0.8  PROT 6.4*  ALBUMIN 3.4*    No results for input(s): LIPASE, AMYLASE in the last 168 hours. No results for input(s): AMMONIA in the last 168 hours. Cardiac Enzymes: No results for input(s): CKTOTAL, CKMB, CKMBINDEX, TROPONINI in the last 168 hours. BNP (last 3 results) Recent Labs    09/20/20 1415 04/02/21 0818  BNP 128.0* 236.4*     ProBNP (last 3 results) No results for input(s): PROBNP in the last 8760  hours.  CBG: Recent Labs  Lab 04/04/21 0616 04/04/21 1158 04/04/21 1647 04/04/21 2019 04/05/21 0614  GLUCAP 98 118* 108* 120* 103*    Recent Results (from the past 240 hour(s))  SARS CORONAVIRUS 2 (TAT 6-24 HRS) Nasopharyngeal Nasopharyngeal Swab     Status: None   Collection Time: 04/02/21 12:24 PM   Specimen: Nasopharyngeal Swab  Result Value Ref Range Status   SARS Coronavirus 2 NEGATIVE NEGATIVE Final    Comment: (NOTE) SARS-CoV-2 target nucleic acids are NOT DETECTED.  The SARS-CoV-2 RNA is generally detectable in upper and lower respiratory specimens during the acute phase of infection. Negative results do not preclude SARS-CoV-2 infection, do not rule out co-infections with other pathogens, and should not  be used as the sole basis for treatment or other patient management decisions. Negative results must be combined with clinical observations, patient history, and epidemiological information. The expected result is Negative.  Fact Sheet for Patients: SugarRoll.be  Fact Sheet for Healthcare Providers: https://www.woods-mathews.com/  This test is not yet approved or cleared by the [place] FDA and  has been authorized for detection and/or diagnosis of SARS-CoV-2 by FDA under an Emergency Use Authorization (EUA). This EUA will remain  in effect (meaning this test can be used) for the duration of the COVID-19 declaration under Se ction 564(b)(1) of the Act, 21 U.S.C. section 360bbb-3(b)(1), unless the authorization is terminated or revoked sooner.  Performed at [facility], [place] [address], [place], [place] [address]       Studies: No results found.    Flora Lipps, MD  [facility] 04/05/2021  If 7PM-7AM, please contact night-coverage

## 2021-04-05 NOTE — Progress Notes (Signed)
Physical Therapy Treatment Patient Details Name: Shirley Leblanc MRN: [id number] DOB: Oct 30, 1945 Today's Date: 04/05/2021    History of Present Illness 75 y.o. female presenting with SOB and chest pains and inability to support her weight with her Rollator.   She has had recurrent cough and has been treated with antibiotics x 3 in the last 6 months.  She also has chronic LLQ abdominal pain and myalgias. Lives at [place] and presents to ED 04/02/21 at suggestion of facility RN. In ED found to have hypoxia in the 80%SaO2 rebounds with 2L O2 via [place]  BNP mildly elevated, CXR with pulm congestion, mildly increased troponin.  She is in afib, off AC.  PMH:  CAD; COPD; HTN; HLD; and obesity    PT Comments    Pt supine in bed on arrival.  Pt eager to participate.  She remains limited in her function due to decreased strength, poor balance and poor endurance.  HR elevated >145bpm this session.  SPO2 decreased to 84% on RA and was re-applied.  Continue to recommend snf at this time.    Follow Up Recommendations  SNF     Equipment Recommendations  None recommended by PT    Recommendations for Other Services       Precautions / Restrictions Precautions Precautions: Fall Precaution Comments: fell in bathroom 2 wks ago and fell off bed 1 wk ago; monitor O2 - does not wear at baseline Restrictions Weight Bearing Restrictions: No    Mobility  Bed Mobility Overal bed mobility: Needs Assistance Bed Mobility: Supine to Sit;Sit to Supine     Supine to sit: HOB elevated;Supervision Sit to supine: Supervision;HOB elevated   General bed mobility comments: no physical assist needed, HOB elevated and light use of bedrails    Transfers Overall transfer level: Needs assistance Equipment used: Rolling walker (2 wheeled) Transfers: Sit to/from Stand Sit to Stand: Min assist         General transfer comment: Cues for hand placement and pushing from seated surface to rise into  standing.  Ambulation/Gait Ambulation/Gait assistance: Min assist Gait Distance (Feet): 6 Feet Assistive device: Rolling walker (2 wheeled) Gait Pattern/deviations: Step-to pattern;Decreased step length - right;Decreased step length - left;Shuffle;Wide base of support     General Gait Details: Pt performed short bout away from bed and back to bed.  Very unsteady and HR elevated to 140s.  required lengthy seated rest break and return back to bed.   Stairs             Wheelchair Mobility    Modified Rankin (Stroke Patients Only)       Balance Overall balance assessment: Needs assistance Sitting-balance support: Feet supported;No upper extremity supported Sitting balance-Leahy Scale: Good       Standing balance-Leahy Scale: Fair                              Cognition Arousal/Alertness: Awake/alert Behavior During Therapy: WFL for tasks assessed/performed Overall Cognitive Status: Within Functional Limits for tasks assessed                                        Exercises      General Comments        Pertinent Vitals/Pain Pain Assessment: No/denies pain    Home Living  Prior Function            PT Goals (current goals can now be found in the care plan section) Acute Rehab PT Goals Patient Stated Goal: get back to being active at her facility Potential to Achieve Goals: Fair Progress towards PT goals: Progressing toward goals    Frequency    Min 2X/week      PT Plan Current plan remains appropriate    Co-evaluation              AM-PAC PT "6 Clicks" Mobility   Outcome Measure  Help needed turning from your back to your side while in a flat bed without using bedrails?: None Help needed moving from lying on your back to sitting on the side of a flat bed without using bedrails?: None Help needed moving to and from a bed to a chair (including a wheelchair)?: A Little Help needed  standing up from a chair using your arms (e.g., wheelchair or bedside chair)?: A Little Help needed to walk in hospital room?: A Little Help needed climbing 3-5 steps with a railing? : Total 6 Click Score: 18    End of Session Equipment Utilized During Treatment: Gait belt;Oxygen Activity Tolerance: Patient tolerated treatment well Patient left: in bed;with bed alarm set;with call bell/phone within reach Nurse Communication: Mobility status PT Visit Diagnosis: Unsteadiness on feet (R26.81);Other abnormalities of gait and mobility (R26.89);Repeated falls (R29.6);Muscle weakness (generalized) (M62.81);History of falling (Z91.81);Difficulty in walking, not elsewhere classified (R26.2)     Time: [id number] PT Time Calculation (min) (ACUTE ONLY): 10 min  Charges:  $Gait Training: 8-22 mins                     Erasmo Leventhal , PTA Acute Rehabilitation Services Pager [phone] Office [place] 04/05/2021, 7:19 PM

## 2021-04-06 LAB — BASIC METABOLIC PANEL
Anion gap: 8 (ref 5–15)
BUN: 20 mg/dL (ref 8–23)
CO2: 41 mmol/L — ABNORMAL HIGH (ref 22–32)
Calcium: 9.9 mg/dL (ref 8.9–10.3)
Chloride: 90 mmol/L — ABNORMAL LOW (ref 98–111)
Creatinine, Ser: 0.85 mg/dL (ref 0.44–1.00)
GFR, Estimated: >60 mL/min (ref >60)
Glucose, Bld: 111 mg/dL — ABNORMAL HIGH (ref 70–99)
Potassium: 3.5 mmol/L (ref 3.5–5.1)
Sodium: 139 mmol/L (ref 135–145)

## 2021-04-06 LAB — CBC
HCT: 36.7 % (ref 36.0–46.0)
Hemoglobin: 11.2 g/dL — ABNORMAL LOW (ref 12.0–15.0)
MCH: 27.7 pg (ref 26.0–34.0)
MCHC: 30.5 g/dL (ref 30.0–36.0)
MCV: 90.8 fL (ref 80.0–100.0)
Platelets: 144 10*3/uL — ABNORMAL LOW (ref 150–400)
RBC: 4.04 MIL/uL (ref 3.87–5.11)
RDW: 15.6 % — ABNORMAL HIGH (ref 11.5–15.5)
WBC: 5.6 10*3/uL (ref 4.0–10.5)
nRBC: 0.4 % — ABNORMAL HIGH (ref 0.0–0.2)

## 2021-04-06 LAB — GLUCOSE, CAPILLARY
Glucose-Capillary: 109 mg/dL — ABNORMAL HIGH (ref 70–99)
Glucose-Capillary: 115 mg/dL — ABNORMAL HIGH (ref 70–99)
Glucose-Capillary: 147 mg/dL — ABNORMAL HIGH (ref 70–99)
Glucose-Capillary: 107 mg/dL — ABNORMAL HIGH (ref 70–99)

## 2021-04-06 LAB — MAGNESIUM: Magnesium: 1.7 mg/dL (ref 1.7–2.4)

## 2021-04-06 MED ORDER — TORSEMIDE 20 MG PO TABS
20.0000 mg | ORAL_TABLET | Freq: Two times a day (BID) | ORAL | Status: DC
  Administered 2021-04-06 – 2021-04-08 (×3): 20 mg via ORAL
  Filled 2021-04-06 (×3): qty 1

## 2021-04-06 NOTE — Consult Note (Signed)
WOC Nurse Consult Note: Reason for Consult:blisters (serum filled) on LEs with edema Wound type:venous insufficiency Pressure Injury POA:N/A Measurement:Small blisters at malleolus.Verified by Unit based wound treatment associate, RN. (WTA). Wound bed:N/A Drainage (amount, consistency, odor) scant serous Periwound: mild erythema Dressing procedure/placement/frequency: Admitting MD has provided guidance for stasis dermatitis using triamcinolone:Eucerin cream twice daily. I will add to the POC that the Bedside RN cover any blisters with folded xeroform gauze, top with ABD and secure by wrapping with Kerlix roll gauze from toe to knee and topping that with an ACE bandage. Feet are to be placed into Prevalon Boots.  PI prevention measures such as turning and repositioning and placement of a sacral silicone foam bordered dressed are ordered.  [place] nursing team will not follow, but will remain available to this patient, the nursing and medical teams.  Please re-consult if needed. Thanks, Maudie Flakes, MSN, RN, [place], Ar[facility] Abbott  Pager# [phone]

## 2021-04-06 NOTE — Progress Notes (Signed)
PROGRESS NOTE  Shirley Leblanc [id number] DOB: 1946/01/05 DOA: 04/02/2021 PCP: Reymundo Poll, MD   LOS: 4 days   Brief narrative:  Shirley Leblanc is a 75 y.o. female with past medical history significant for COPD, hypertension, hyperlipidemia and obesity presented to hospital with shortness of breath.  Patient had been having some symptoms for several months now.  She has had recurrent cough treated with antibiotic x3 in the last 6 months and also complained of left lower quadrant abdominal pain and myalgia.  Patient did have history of COVID and pneumonia in the past.  In the ED, patient was noted to be hypoxic with pulse ox of saturation 80%.  BNP was elevated.  Chest x-ray showed pulmonary vascular congestion with mildly elevated troponin.  Patient also had mild wheezing and was given duo nebs and was admitted to the hospital. . Assessment/Plan:  Principal Problem:   Acute on chronic diastolic CHF (congestive heart failure) (HCC) Active Problems:   Hypertension associated with diabetes ([place])   Atrial fibrillation (HCC)   Hypothyroid   Hyperlipidemia associated with type 2 diabetes mellitus (HCC)   Chronic back pain   Stasis dermatitis of both legs  Acute on chronic diastolic CHF Gradually improving.  Patient has been receiving significant diuresis on 40 mg of Lasix IV twice daily.   Wean oxygen as able.  2D echocardiogram with LV ejection fraction of 55 to 60%. Continue lisinopril, Coreg.  2D echocardiogram with LV ejection fraction of 55 to 60%.  Patient was supposed to be on torsemide 20 mg daily at home.  Patient had significant diuretic response after Zaroxolyn yesterday.  Is negative for 10 L and had almost 5 L urine output yesterday.  Will change back to torsemide 20 mg twice daily.  Mild elevated troponin.  No chest pain. EKG unremarkable.  Likely demand ischemia.  Flat troponins.  Continue low-dose Coreg.    Essential HTN On lisinopril, Flomax.  Low-dose Coreg was added on  admission.  Blood pressure stable at this time.     Hyperlipidemia Continue Lipitor.   History of atrial fibrillation. Was not on anticoagulation for unknown reason.  Currently on aspirin 81 mg daily.    history of COPD On nebulizer.  No active wheezing.   Mood disorder Continue Buspar, Ativan, Zoloft   Hypothyroidism Continue Synthroid   Stasis dermatitis Continue Eucerin, could consider Unna boot if needed   Chronic pain Complains of back and left shoulder pain. Continue Percocet.    Generalized weakness, debility. PT on board and recommended skilled nursing facility placement.   Goals of care. DNR. Granddaughter wishes hospice evaluation.  DVT prophylaxis:   Lovenox subcu  Code Status:  DNR  Family Communication:. Spoke with the patient's granddaughter, HPOA yesterday. I also spoke with the patient's daughter Lynelle Smoke as well.  Status is: Inpatient  Remains inpatient appropriate because: IV treatments appropriate due to intensity of illness or inability to take PO, and Inpatient level of care appropriate due to severity of illness  Dispo: The patient is from: ALF              Anticipated d/c is to: ALF/SNF with hospice.              Patient currently is not medically stable to d/c.   Difficult to place patient No  Consultants: None  Procedures: None  Anti-infectives:  None  Anti-infectives (From admission, onward)    None      Subjective: Today, patient was seen and examined  at bedside.  Continues to feel better.  Swelling has improved including breathing.   Objective: Vitals:   04/06/21 0500 04/06/21 0828  BP: (!) 131/109 118/64  Pulse: 89 85  Resp: 20   Temp: 98.1 F (36.7 C) 97.9 F (36.6 C)  SpO2: 95% (!) 89%    Intake/Output Summary (Last 24 hours) at 04/06/2021 1043 Last data filed at 04/06/2021 0900 Gross per 24 hour  Intake 200 ml  Output 6150 ml  Net -5950 ml    Filed Weights   04/04/21 0534 04/05/21 0516 04/06/21 0500   Weight: 132.4 kg 131.1 kg 126.1 kg   Body mass index is 46.26 kg/m.   Physical Exam: General: Morbidly obese built, not in obvious distress, on nasal cannula oxygen HENT:   No scleral pallor or icterus noted. Oral mucosa is moist.  Chest:   Diminished breath sounds bilaterally.  No overt crackles noted today. CVS: S1 &S2 heard. No murmur.  Regular rate and rhythm. Abdomen: Soft, nontender, nondistended.  Bowel sounds are heard.   Extremities: No cyanosis, clubbing but with bilateral lower extremity edema, features of chronic venous insufficiency.  Peripheral pulses are palpable. Psych: Alert, awake and communicative, normal mood CNS:  No cranial nerve deficits.  Power equal in all extremities.   Skin: Warm and dry.  No rashes noted.  Data Review: I have personally reviewed the following laboratory data and studies,  CBC: Recent Labs  Lab 04/02/21 0818 04/03/21 0137 04/04/21 0327 04/05/21 0356 04/06/21 0329  WBC 5.7 3.8* 6.3 5.2 5.6  NEUTROABS 4.0  --   --   --   --   HGB 11.2* 10.8* 10.5* 10.2* 11.2*  HCT 37.4 35.4* 35.7* 34.8* 36.7  MCV 94.2 92.4 94.2 94.6 90.8  PLT 118* 133* 127* 125* 144*    Basic Metabolic Panel: Recent Labs  Lab 04/02/21 0818 04/03/21 0137 04/04/21 0327 04/05/21 0356 04/06/21 0329  NA 137 135 136 137 139  K 4.4 4.7 4.2 4.2 3.5  CL 100 96* 95* 96* 90*  CO2 30 34* 35* 37* 41*  GLUCOSE 106* 154* 111* 104* 111*  BUN '17 16 22 '$ 25* 20  CREATININE 0.86 0.85 1.00 0.87 0.85  CALCIUM 9.2 9.1 9.5 9.0 9.9  MG  --   --  1.7 1.7 1.7  PHOS  --   --  3.4 3.9  --     Liver Function Tests: Recent Labs  Lab 04/02/21 0818  AST 18  ALT 11  ALKPHOS 83  BILITOT 0.8  PROT 6.4*  ALBUMIN 3.4*    No results for input(s): LIPASE, AMYLASE in the last 168 hours. No results for input(s): AMMONIA in the last 168 hours. Cardiac Enzymes: No results for input(s): CKTOTAL, CKMB, CKMBINDEX, TROPONINI in the last 168 hours. BNP (last 3 results) Recent Labs     09/20/20 1415 04/02/21 0818  BNP 128.0* 236.4*     ProBNP (last 3 results) No results for input(s): PROBNP in the last 8760 hours.  CBG: Recent Labs  Lab 04/05/21 0614 04/05/21 1118 04/05/21 1609 04/05/21 2017 04/06/21 0502  GLUCAP 103* 111* 121* 119* 109*    Recent Results (from the past 240 hour(s))  SARS CORONAVIRUS 2 (TAT 6-24 HRS) Nasopharyngeal Nasopharyngeal Swab     Status: None   Collection Time: 04/02/21 12:24 PM   Specimen: Nasopharyngeal Swab  Result Value Ref Range Status   SARS Coronavirus 2 NEGATIVE NEGATIVE Final    Comment: (NOTE) SARS-CoV-2 target nucleic acids are NOT DETECTED.  The SARS-CoV-2 RNA is generally detectable in upper and lower respiratory specimens during the acute phase of infection. Negative results do not preclude SARS-CoV-2 infection, do not rule out co-infections with other pathogens, and should not be used as the sole basis for treatment or other patient management decisions. Negative results must be combined with clinical observations, patient history, and epidemiological information. The expected result is Negative.  Fact Sheet for Patients: SugarRoll.be  Fact Sheet for Healthcare Providers: https://www.woods-mathews.com/  This test is not yet approved or cleared by the [place] FDA and  has been authorized for detection and/or diagnosis of SARS-CoV-2 by FDA under an Emergency Use Authorization (EUA). This EUA will remain  in effect (meaning this test can be used) for the duration of the COVID-19 declaration under Se ction 564(b)(1) of the Act, 21 U.S.C. section 360bbb-3(b)(1), unless the authorization is terminated or revoked sooner.  Performed at [facility], [place] [address], [place], [place] [address]       Studies: No results found.    Flora Lipps, MD  [facility] 04/06/2021  If 7PM-7AM, please contact night-coverage

## 2021-04-07 LAB — BASIC METABOLIC PANEL
Anion gap: 8 (ref 5–15)
BUN: 17 mg/dL (ref 8–23)
CO2: 41 mmol/L — ABNORMAL HIGH (ref 22–32)
Calcium: 9.9 mg/dL (ref 8.9–10.3)
Chloride: 88 mmol/L — ABNORMAL LOW (ref 98–111)
Creatinine, Ser: 0.9 mg/dL (ref 0.44–1.00)
GFR, Estimated: >60 mL/min (ref >60)
Glucose, Bld: 108 mg/dL — ABNORMAL HIGH (ref 70–99)
Potassium: 3.6 mmol/L (ref 3.5–5.1)
Sodium: 137 mmol/L (ref 135–145)

## 2021-04-07 LAB — CBC
HCT: 38 % (ref 36.0–46.0)
Hemoglobin: 11.9 g/dL — ABNORMAL LOW (ref 12.0–15.0)
MCH: 27.8 pg (ref 26.0–34.0)
MCHC: 31.3 g/dL (ref 30.0–36.0)
MCV: 88.8 fL (ref 80.0–100.0)
Platelets: 154 10*3/uL (ref 150–400)
RBC: 4.28 MIL/uL (ref 3.87–5.11)
RDW: 15.4 % (ref 11.5–15.5)
WBC: 5.9 10*3/uL (ref 4.0–10.5)
nRBC: 0 % (ref 0.0–0.2)

## 2021-04-07 LAB — GLUCOSE, CAPILLARY
Glucose-Capillary: 122 mg/dL — ABNORMAL HIGH (ref 70–99)
Glucose-Capillary: 115 mg/dL — ABNORMAL HIGH (ref 70–99)
Glucose-Capillary: 130 mg/dL — ABNORMAL HIGH (ref 70–99)
Glucose-Capillary: 132 mg/dL — ABNORMAL HIGH (ref 70–99)

## 2021-04-07 LAB — MAGNESIUM: Magnesium: 1.7 mg/dL (ref 1.7–2.4)

## 2021-04-07 NOTE — Progress Notes (Addendum)
PROGRESS NOTE  ZIPORA Leblanc [id number] DOB: March 23, 1946 DOA: 04/02/2021 PCP: Reymundo Poll, MD   LOS: 5 days   Brief narrative:  Shirley Leblanc is a 75 y.o. female with past medical history significant for COPD, hypertension, hyperlipidemia and obesity presented to hospital with shortness of breath.  Patient had been having some symptoms for several months now.  She has had recurrent cough treated with antibiotic x3 in the last 6 months and also complained of left lower quadrant abdominal pain and myalgia.  Patient did have history of COVID and pneumonia in the past.  In the ED, patient was noted to be hypoxic with pulse ox of saturation 80%.  BNP was elevated.  Chest x-ray showed pulmonary vascular congestion with mildly elevated troponin.  Patient also had mild wheezing and was given duo nebs and was admitted to the hospital. . Assessment/Plan:  Principal Problem:   Acute on chronic diastolic CHF (congestive heart failure) (HCC) Active Problems:   Hypertension associated with diabetes ([place])   Atrial fibrillation (HCC)   Hypothyroid   Hyperlipidemia associated with type 2 diabetes mellitus (HCC)   Chronic back pain   Stasis dermatitis of both legs  Acute on chronic diastolic CHF Gradually improving.  Patient has been receiving significant diuresis on 40 mg of Lasix IV twice daily.  Has been transitioned to torsemide 20 twice daily.  Received Zaroxolyn during hospitalization with significant diuresis.   2D echocardiogram with LV ejection fraction of 55 to 60%. Continue lisinopril, Coreg.  Patient was supposed on torsemide 20 mg daily at home.   Mild elevated troponin.  No chest pain. EKG unremarkable.  Likely demand ischemia.  Flat troponins.  Continue low-dose Coreg.    Essential HTN On lisinopril, Flomax.  Low-dose Coreg was added on admission.  Blood pressure stable.   Hyperlipidemia Continue Lipitor.   History of atrial fibrillation. Was not on anticoagulation for unknown  reason.  Currently on aspirin 81 mg daily.    Reported history of COPD On nebulizer.  No active wheezing.   Mood disorder Continue Buspar, Ativan, Zoloft   Hypothyroidism -Continue Synthroid   Stasis dermatitis Continue Eucerin, could consider Unna boot if needed   Chronic pain Complains of back and left shoulder pain. Continue Percocet.    Generalized weakness, debility. PT on board.   Goals of care. DNR. Granddaughter wishes hospice evaluation.  DVT prophylaxis:   Lovenox subcu  Code Status:  DNR  Family Communication:. Spoke with the patient's granddaughter again today at bedside   Status is: Inpatient  Remains inpatient appropriate because:Unsafe d/c plan, IV treatments appropriate due to intensity of illness or inability to take PO, and Inpatient level of care appropriate due to severity of illness  Dispo: The patient is from: ALF              Anticipated d/c is to: ALF/SNF with hospice.              Patient currently is medically stable to d/c.   Difficult to place patient No  Consultants: None  Procedures: None  Anti-infectives:  None  Anti-infectives (From admission, onward)    None      Subjective: Today, patient was seen and examined  at bedside.  Patient still complains of mild back and left shoulder pain.  Overall feels better with breathing.  Has slept better.  Objective: Vitals:   04/06/21 1941 04/07/21 0342  BP: (!) 158/99 126/78  Pulse: 96 (!) 105  Resp: 15 17  Temp:  98.1 F (36.7 C) 98.3 F (36.8 C)  SpO2: 96% 98%    Intake/Output Summary (Last 24 hours) at 04/07/2021 0856 Last data filed at 04/07/2021 0755 Gross per 24 hour  Intake 440 ml  Output 4900 ml  Net -4460 ml    Filed Weights   04/05/21 0516 04/06/21 0500 04/07/21 0342  Weight: 131.1 kg 126.1 kg 119.7 kg   Body mass index is 43.92 kg/m.   Physical Exam:  General: Morbidly obese built, not in obvious distress HENT:   No scleral pallor or icterus noted. Oral  mucosa is moist.  Chest:  Diminished breath sounds bilaterally. CVS: S1 &S2 heard. No murmur.  Regular rate and rhythm. Abdomen: Soft, nontender, nondistended.  Bowel sounds are heard.   Extremities: No cyanosis, clubbing with bilateral lower extremity edema, chronic venous insufficiency, peripheral pulses are palpable. Psych: Alert, awake and oriented, normal mood CNS:  No cranial nerve deficits.  Power equal in all extremities.   Skin: Warm and dry.  No rashes noted.   Data Review: I have personally reviewed the following laboratory data and studies,  CBC: Recent Labs  Lab 04/02/21 0818 04/03/21 0137 04/04/21 0327 04/05/21 0356 04/06/21 0329 04/07/21 0205  WBC 5.7 3.8* 6.3 5.2 5.6 5.9  NEUTROABS 4.0  --   --   --   --   --   HGB 11.2* 10.8* 10.5* 10.2* 11.2* 11.9*  HCT 37.4 35.4* 35.7* 34.8* 36.7 38.0  MCV 94.2 92.4 94.2 94.6 90.8 88.8  PLT 118* 133* 127* 125* 144* [id number]    Basic Metabolic Panel: Recent Labs  Lab 04/03/21 0137 04/04/21 0327 04/05/21 0356 04/06/21 0329 04/07/21 0205  NA 135 136 137 139 137  K 4.7 4.2 4.2 3.5 3.6  CL 96* 95* 96* 90* 88*  CO2 34* 35* 37* 41* 41*  GLUCOSE 154* 111* 104* 111* 108*  BUN 16 22 25* 20 17  CREATININE 0.85 1.00 0.87 0.85 0.90  CALCIUM 9.1 9.5 9.0 9.9 9.9  MG  --  1.7 1.7 1.7 1.7  PHOS  --  3.4 3.9  --   --     Liver Function Tests: Recent Labs  Lab 04/02/21 0818  AST 18  ALT 11  ALKPHOS 83  BILITOT 0.8  PROT 6.4*  ALBUMIN 3.4*    No results for input(s): LIPASE, AMYLASE in the last 168 hours. No results for input(s): AMMONIA in the last 168 hours. Cardiac Enzymes: No results for input(s): CKTOTAL, CKMB, CKMBINDEX, TROPONINI in the last 168 hours. BNP (last 3 results) Recent Labs    09/20/20 1415 04/02/21 0818  BNP 128.0* 236.4*     ProBNP (last 3 results) No results for input(s): PROBNP in the last 8760 hours.  CBG: Recent Labs  Lab 04/06/21 0502 04/06/21 1113 04/06/21 1610 04/06/21 2027  04/07/21 0359  GLUCAP 109* 115* 147* 107* 122*    Recent Results (from the past 240 hour(s))  SARS CORONAVIRUS 2 (TAT 6-24 HRS) Nasopharyngeal Nasopharyngeal Swab     Status: None   Collection Time: 04/02/21 12:24 PM   Specimen: Nasopharyngeal Swab  Result Value Ref Range Status   SARS Coronavirus 2 NEGATIVE NEGATIVE Final    Comment: (NOTE) SARS-CoV-2 target nucleic acids are NOT DETECTED.  The SARS-CoV-2 RNA is generally detectable in upper and lower respiratory specimens during the acute phase of infection. Negative results do not preclude SARS-CoV-2 infection, do not rule out co-infections with other pathogens, and should not be used as the sole basis for treatment  or other patient management decisions. Negative results must be combined with clinical observations, patient history, and epidemiological information. The expected result is Negative.  Fact Sheet for Patients: SugarRoll.be  Fact Sheet for Healthcare Providers: https://www.woods-mathews.com/  This test is not yet approved or cleared by the [place] FDA and  has been authorized for detection and/or diagnosis of SARS-CoV-2 by FDA under an Emergency Use Authorization (EUA). This EUA will remain  in effect (meaning this test can be used) for the duration of the COVID-19 declaration under Se ction 564(b)(1) of the Act, 21 U.S.C. section 360bbb-3(b)(1), unless the authorization is terminated or revoked sooner.  Performed at [facility], [place] [address], [place], [place] [address]       Studies: No results found.    Flora Lipps, MD  [facility] 04/07/2021  If 7PM-7AM, please contact night-coverage

## 2021-04-07 NOTE — Plan of Care (Signed)
  Problem: Education: Goal: Knowledge of General Education information will improve Description Including pain rating scale, medication(s)/side effects and non-pharmacologic comfort measures Outcome: Progressing   

## 2021-04-08 LAB — BASIC METABOLIC PANEL
Anion gap: 10 (ref 5–15)
BUN: 18 mg/dL (ref 8–23)
CO2: 37 mmol/L — ABNORMAL HIGH (ref 22–32)
Calcium: 9.9 mg/dL (ref 8.9–10.3)
Chloride: 89 mmol/L — ABNORMAL LOW (ref 98–111)
Creatinine, Ser: 1 mg/dL (ref 0.44–1.00)
GFR, Estimated: 59 mL/min — ABNORMAL LOW (ref >60)
Glucose, Bld: 136 mg/dL — ABNORMAL HIGH (ref 70–99)
Potassium: 3.5 mmol/L (ref 3.5–5.1)
Sodium: 136 mmol/L (ref 135–145)

## 2021-04-08 LAB — GLUCOSE, CAPILLARY
Glucose-Capillary: 128 mg/dL — ABNORMAL HIGH (ref 70–99)
Glucose-Capillary: 109 mg/dL — ABNORMAL HIGH (ref 70–99)
Glucose-Capillary: 124 mg/dL — ABNORMAL HIGH (ref 70–99)

## 2021-04-08 LAB — CBC
HCT: 40.6 % (ref 36.0–46.0)
Hemoglobin: 12.7 g/dL (ref 12.0–15.0)
MCH: 27.4 pg (ref 26.0–34.0)
MCHC: 31.3 g/dL (ref 30.0–36.0)
MCV: 87.7 fL (ref 80.0–100.0)
Platelets: 166 10*3/uL (ref 150–400)
RBC: 4.63 MIL/uL (ref 3.87–5.11)
RDW: 15.4 % (ref 11.5–15.5)
WBC: 7.4 10*3/uL (ref 4.0–10.5)
nRBC: 0 % (ref 0.0–0.2)

## 2021-04-08 LAB — RESP PANEL BY RT-PCR (FLU A&B, COVID) ARPGX2
Influenza A by PCR: NEGATIVE
Influenza B by PCR: NEGATIVE
SARS Coronavirus 2 by RT PCR: NEGATIVE

## 2021-04-08 LAB — MAGNESIUM: Magnesium: 1.7 mg/dL (ref 1.7–2.4)

## 2021-04-08 MED ORDER — TORSEMIDE 20 MG PO TABS: 20.0000 mg | ORAL_TABLET | Freq: Two times a day (BID) | ORAL | Status: DC

## 2021-04-08 MED ORDER — SALINE SPRAY 0.65 % NA SOLN: 1.0000 | NASAL | 0 refills | Status: DC | PRN

## 2021-04-08 MED ORDER — OXYCODONE-ACETAMINOPHEN 10-325 MG PO TABS: 1.0000 | ORAL_TABLET | Freq: Four times a day (QID) | ORAL | 0 refills | Status: DC

## 2021-04-08 MED ORDER — ADULT MULTIVITAMIN W/MINERALS CH: 1.0000 | ORAL_TABLET | Freq: Every day | ORAL | Status: DC

## 2021-04-08 MED ORDER — DOCUSATE SODIUM 100 MG PO CAPS: 100.0000 mg | ORAL_CAPSULE | Freq: Two times a day (BID) | ORAL | 0 refills | Status: DC | PRN

## 2021-04-08 MED ORDER — PROSOURCE PLUS PO LIQD: 30.0000 mL | Freq: Two times a day (BID) | ORAL | Status: DC

## 2021-04-08 MED ORDER — ASPIRIN 81 MG PO TBEC: 81.0000 mg | DELAYED_RELEASE_TABLET | Freq: Every day | ORAL | Status: DC

## 2021-04-08 MED ORDER — CARVEDILOL 3.125 MG PO TABS: 3.1250 mg | ORAL_TABLET | Freq: Two times a day (BID) | ORAL | Status: DC

## 2021-04-08 NOTE — Plan of Care (Signed)
?  Problem: Elimination: ?Goal: Will not experience complications related to urinary retention ?Outcome: Progressing ?  ?

## 2021-04-08 NOTE — Progress Notes (Signed)
RN attempted to call report to [facility]. Unable to speak to any representative to give report. RN will call again in 10 mins.

## 2021-04-08 NOTE — Care Management Important Message (Signed)
Important Message  Patient Details  Name: Shirley Leblanc MRN: [id number] Date of Birth: 1945-10-08   Medicare Important Message Given:  Yes     Orbie Pyo 04/08/2021, 3:24 PM

## 2021-04-08 NOTE — Progress Notes (Signed)
   04/08/21 1507  Mobility  Activity Refused mobility   Nelta Numbers Mobility Specialist  Phone [phone]

## 2021-04-08 NOTE — Progress Notes (Signed)
Manufacturing engineer [facility]) lision note  Referral received from [facility] for hospices services at [place] on [address] after discharge.  Chart and pt to be reviewed by [facility] MD.  Hospice eligibility pending at this time.   Visited with pt and granddaughter POA Chrissy at bedside.  Education initiated on hospice services.  Both verbalize understanding.  Contact information given to [place].  Per discussion pt will discharge today via [facility].  Please send signed DNR with pt.  Please send rx for medication needed un til pt can be admitted onto hospice services.  DME needs discussed.  Pt elects to stay in her own bed at this time.  No new DME needs identified.  Please do not hesitate to call with any hospice related questions.  Thank you for the opportunity to participate in this pt's care.  Domenic Moras, BSN, RN [facility] liaison [phone] (24h on call)

## 2021-04-08 NOTE — TOC Transition Note (Signed)
Transition of Care [facility]) - CM/SW Discharge Note   Patient Details  Name: KODI SCHERGER MRN: [id number] Date of Birth: 05/23/1946  [facility] CM/SW Contact:  Tresa Endo Phone Number: 04/08/2021, 12:15 PM   Clinical Narrative:    Patient will DC to: [facility] Anticipated DC date: 04/08/2021 Family notified: Pt Granddaughter  Transport by: Corey Harold   Per MD patient ready for DC to [facility]. RN to call report prior to discharge ([phone]). RN, patient, patient's family, and facility notified of DC. Discharge Summary and FL2 sent to facility. DC packet on chart. Ambulance transport requested for patient.   CSW will sign off for now as social work intervention is no longer needed. Please consult [place] again if new needs arise.     Final next level of care: Skilled Nursing Facility Barriers to Discharge: Continued Medical Work up   Patient Goals and CMS Choice Patient states their goals for this hospitalization and ongoing recovery are:: Rehab CMS Medicare.gov Compare Post Acute Care list provided to:: Patient Choice offered to / list presented to : Patient, [place] / [place]  Discharge Placement                       Discharge Plan and Services In-house Referral: Clinical Social Work Discharge Planning Services: NA Post Acute Care Choice: Skilled Nursing Facility                               Social Determinants of Health (SDOH) Interventions     Readmission Risk Interventions No flowsheet data found.

## 2021-04-08 NOTE — [place] FL2 (Signed)
[place] MEDICAID FL2 LEVEL OF CARE SCREENING TOOL     IDENTIFICATION  Patient Name: Shirley Leblanc Birthdate: 17-Jun-1946 Sex: female Admission Date (Current Location): 04/02/2021  [facility] and [place] Number:  Herbalist and Address:  [facility] [place], [place] [address], [place],  [address]      Provider Number: [id number]  Attending Physician Name and Address:  Flora Lipps, MD  Relative Name and Phone Number:  Lamar Benes (granddaughter)  phone [phone] ; cell [phone]    Current Level of Care: Hospital Recommended Level of Care: [place] Prior Approval Number:    Date Approved/Denied:   PASRR Number: [id number]  Discharge Plan: Other (Comment) (ALF)    Current Diagnoses: Patient Active Problem List   Diagnosis Date Noted   Acute on chronic diastolic CHF (congestive heart failure) ([place]) 04/02/2021   Stasis dermatitis of both legs 04/02/2021   Dizziness    Tremor 09/18/2017   Encephalopathy    Tremors of nervous system    Near syncope 09/16/2017   Syncope 09/15/2017   CAD (coronary artery disease) 09/15/2017   At HIGH RISK for fall 05/15/2017   Chest pain 04/28/2017   Osteopenia 02/02/2017   Morbid obesity ([place]) 11/13/2016   Elevated troponin 11/13/2016   Chronic back pain 10/10/2016   Pain medication agreement signed 10/10/2016   Opioid dependence ([place]) 10/10/2016   Diabetes ([place]) 09/19/2016   Depression 04/18/2016   Hypothyroid 02/22/2016   Hyperlipidemia associated with type 2 diabetes mellitus ([place]) 02/22/2016   Chronic anticoagulation - Eliquis, CHADS2VASC=3 06/05/2015   Atrial fibrillation ([place]) 05/16/2015   S/P laparoscopic cholecystectomy    Hypertension associated with diabetes ([place]) 05/09/2015   Dyspnea on exertion 03/14/2011    Orientation RESPIRATION BLADDER Height & Weight     Self, Time, Situation, Place  Normal Incontinent, External catheter Weight: 257 lb 12.8 oz (116.9  kg) Height:  5' 5"  (165.1 cm)  BEHAVIORAL SYMPTOMS/MOOD NEUROLOGICAL BOWEL NUTRITION STATUS      Continent Diet (see DC summary)  AMBULATORY STATUS COMMUNICATION OF NEEDS Skin   Extensive Assist Verbally Normal                       Personal Care Assistance Level of Assistance  Bathing, Feeding, Dressing Bathing Assistance: Maximum assistance Feeding assistance: Independent Dressing Assistance: Maximum assistance     Functional Limitations Info  Sight, Hearing, Speech Sight Info: Adequate Hearing Info: Adequate Speech Info: Adequate    SPECIAL CARE FACTORS FREQUENCY  PT (By licensed PT), OT (By licensed OT)     PT Frequency: 5x/week OT Frequency: 5x/week            Contractures Contractures Info: Not present    Additional Factors Info  Code Status, Allergies Code Status Info: DNR Allergies Info: Iodinated Diagnostic Agents, Penicillins, Sulfa Antibiotics, Penicillin G           Current Medications (04/08/2021):  This is the current hospital active medication list Current Facility-Administered Medications  Medication Dose Route Frequency Provider Last Rate Last Admin   (feeding supplement) PROSource Plus liquid 30 mL  30 mL Oral BID BM Pokhrel, Laxman, MD   30 mL at 04/07/21 1011   acetaminophen (TYLENOL) tablet 650 mg  650 mg Oral Q6H PRN Karmen Bongo, MD   650 mg at 04/08/21 6384   Or   acetaminophen (TYLENOL) suppository 650 mg  650 mg Rectal Q6H PRN Karmen Bongo, MD  oxyCODONE (Oxy IR/ROXICODONE) immediate release tablet 10 mg  10 mg Oral Q8H Karmen Bongo, MD   10 mg at 04/08/21 1418   And   acetaminophen (TYLENOL) tablet 325 mg  325 mg Oral Lynne Logan, MD   325 mg at 04/08/21 1418   albuterol (PROVENTIL) (2.5 MG/3ML) 0.083% nebulizer solution 2.5 mg  2.5 mg Nebulization Q2H PRN Karmen Bongo, MD       aspirin EC tablet 81 mg  81 mg Oral Daily Karmen Bongo, MD   81 mg at 04/08/21 1029   atorvastatin (LIPITOR) tablet 10 mg  10  mg Oral Daily Karmen Bongo, MD   10 mg at 04/08/21 1029   busPIRone (BUSPAR) tablet 20 mg  20 mg Oral TID Karmen Bongo, MD   20 mg at 04/08/21 1029   carvedilol (COREG) tablet 3.125 mg  3.125 mg Oral BID WC Karmen Bongo, MD   3.125 mg at 04/08/21 1029   docusate sodium (COLACE) capsule 100 mg  100 mg Oral BID Karmen Bongo, MD   100 mg at 04/08/21 1029   enoxaparin (LOVENOX) injection 60 mg  60 mg Subcutaneous Daily Dang, Thuy D, RPH   60 mg at 04/08/21 1029   hydrALAZINE (APRESOLINE) injection 5 mg  5 mg Intravenous Q4H PRN Karmen Bongo, MD       insulin aspart (novoLOG) injection 0-15 Units  0-15 Units Subcutaneous TID [facility] Karmen Bongo, MD   2 Units at 04/08/21 2706   levothyroxine (SYNTHROID) tablet 75 mcg  75 mcg Oral QAC breakfast Karmen Bongo, MD   75 mcg at 04/08/21 0723   lisinopril (ZESTRIL) tablet 40 mg  40 mg Oral Daily Karmen Bongo, MD   40 mg at 04/08/21 1029   LORazepam (ATIVAN) tablet 0.5 mg  0.5 mg Oral BID Karmen Bongo, MD   0.5 mg at 04/08/21 1029   multivitamin with minerals tablet 1 tablet  1 tablet Oral Daily Pokhrel, Laxman, MD   1 tablet at 04/08/21 1029   ondansetron (ZOFRAN) tablet 4 mg  4 mg Oral Q6H PRN Karmen Bongo, MD   4 mg at 04/06/21 1337   Or   ondansetron (ZOFRAN) injection 4 mg  4 mg Intravenous Q6H PRN Karmen Bongo, MD   4 mg at 04/07/21 1155   pantoprazole (PROTONIX) EC tablet 40 mg  40 mg Oral QPM Karmen Bongo, MD   40 mg at 04/07/21 1704   polyethylene glycol (MIRALAX / GLYCOLAX) packet 17 g  17 g Oral BID Pokhrel, Laxman, MD   17 g at 04/06/21 0857   psyllium (HYDROCIL/METAMUCIL) 1 packet  1 packet Oral QHS Heloise Purpura, RPH   1 packet at 04/04/21 2048   senna (SENOKOT) tablet 17.2 mg  2 tablet Oral Ivery Quale, MD   17.2 mg at 04/03/21 2114   sertraline (ZOLOFT) tablet 200 mg  200 mg Oral Daily Karmen Bongo, MD   200 mg at 04/08/21 1029   sodium chloride (OCEAN) 0.65 % nasal spray 1 spray  1 spray Each Nare  PRN Pokhrel, Laxman, MD       tamsulosin (FLOMAX) capsule 0.4 mg  0.4 mg Oral QHS Karmen Bongo, MD   0.4 mg at 04/07/21 2342   torsemide (DEMADEX) tablet 20 mg  20 mg Oral BID Pokhrel, Laxman, MD   20 mg at 04/08/21 1038   triamcinolone 0.1 % cream : eucerin cream, 1:1   Topical BID Karmen Bongo, MD   Given at 04/08/21 1030  Discharge Medications: (feeding supplement) PROSource Plus liquid Take 30 mLs by mouth 2 (two) times daily between meals.    acetaminophen 325 MG tablet Commonly known as: TYLENOL Take 650 mg by mouth every 12 (twelve) hours as needed for mild pain or fever.    albuterol 108 (90 Base) MCG/ACT inhaler Commonly known as: VENTOLIN HFA TAKE 2 PUFFS BY MOUTH EVERY 6 HOURS AS NEEDED FOR WHEEZE OR SHORTNESS OF BREATH What changed: See the new instructions.    aspirin 81 MG EC tablet Take 1 tablet (81 mg total) by mouth daily. Swallow whole. Start taking on: April 09, 2021    atorvastatin 10 MG tablet Commonly known as: LIPITOR Take 10 mg by mouth daily.    blood glucose meter kit and supplies Kit Dispense per insurance preference. Use up to four times daily . E 11.9    busPIRone 10 MG tablet Commonly known as: BUSPAR Take 20 mg by mouth 3 (three) times daily.    carvedilol 3.125 MG tablet Commonly known as: COREG Take 1 tablet (3.125 mg total) by mouth 2 (two) times daily with a meal.    diclofenac Sodium 1 % Gel Commonly known as: VOLTAREN Apply 1 application topically 2 (two) times daily as needed (neck and shoulder pain).    docusate sodium 100 MG capsule Commonly known as: COLACE Take 1 capsule (100 mg total) by mouth 2 (two) times daily as needed for mild constipation.    glucose blood test strip Commonly known as: Glucose Meter Test Use BID    levothyroxine 75 MCG tablet Commonly known as: SYNTHROID Take 75 mcg by mouth daily before breakfast.    lisinopril 40 MG tablet Commonly known as: ZESTRIL Take 40 mg by mouth daily.     loperamide 2 MG tablet Commonly known as: IMODIUM A-D Take 2 mg by mouth 3 (three) times daily as needed for diarrhea or loose stools.    LORazepam 1 MG tablet Commonly known as: ATIVAN Take 0.5 mg by mouth 2 (two) times daily.    multivitamin with minerals Tabs tablet Take 1 tablet by mouth daily. Start taking on: April 09, 2021    ondansetron 8 MG tablet Commonly known as: ZOFRAN Take 8 mg by mouth every 8 (eight) hours as needed for nausea or vomiting.    oxyCODONE-acetaminophen 10-325 MG tablet Commonly known as: PERCOCET Take 1 tablet by mouth in the morning, at noon, in the evening, and at bedtime.    pantoprazole 40 MG tablet Commonly known as: PROTONIX Take 40 mg by mouth every evening.    polyethylene glycol 17 g packet Commonly known as: MIRALAX / GLYCOLAX Take 17 g by mouth daily. (0800)    Preparation H 1 % Generic drug: hydrocortisone cream Apply 1 application topically 2 (two) times daily as needed for itching.    psyllium 0.52 g capsule Commonly known as: REGULOID Take 0.52 g by mouth at bedtime. (2000)    Salonpas 3.08-02-08 % Ptch Generic drug: Camphor-Menthol-Methyl Sal Apply 1 patch topically every 12 (twelve) hours as needed (pain).    senna 8.6 MG tablet Commonly known as: SENOKOT Take 2 tablets by mouth at bedtime. (2000)    sertraline 100 MG tablet Commonly known as: ZOLOFT Take 200 mg by mouth daily.    simethicone 125 MG chewable tablet Commonly known as: MYLICON Chew 177 mg by mouth every 6 (six) hours as needed for flatulence.    sodium chloride 0.65 % Soln nasal spray Commonly known as: OCEAN Place 1  spray into both nostrils as needed for congestion (nose irritation).    tamsulosin 0.4 MG Caps capsule Commonly known as: FLOMAX Take 0.4 mg by mouth at bedtime. (2100)    torsemide 20 MG tablet Commonly known as: DEMADEX Take 1 tablet (20 mg total) by mouth 2 (two) times daily. What changed: when to take this       Relevant Imaging Results:  Relevant Lab Results:   Additional Information [place], [place]

## 2021-04-08 NOTE — Progress Notes (Signed)
Occupational Therapy Treatment Patient Details Name: Shirley Leblanc MRN: [id number] DOB: [birth year] Today's Date: 04/08/2021   History of present illness 75 y.o. female presenting with SOB and chest pains and inability to support her weight with her Rollator.   She has had recurrent cough and has been treated with antibiotics x 3 in the last 6 months.  She also has chronic LLQ abdominal pain and myalgias. Lives at [facility] and presents to ED 04/02/21 at suggestion of facility RN. In ED found to have hypoxia in the 80%SaO2 rebounds with 2L O2 via [place]  BNP mildly elevated, CXR with pulm congestion, mildly increased troponin.  She is in afib, off AC.  PMH:  CAD; COPD; HTN; HLD; and obesity   OT comments  Pt progressing well towards OT goals, able to mobilize to/from sink using RW at min guard. Pt with no overt LOB but still limited by cardiopulmonary tolerance deficits. Pt declined to attempt grooming tasks in standing today due to fatigue. Plan to progress mobility to/from bathroom and standing tolerance with ADLs in next session. DC recs remain appropriate.  Desats to 86% on 1 L O2, quickly recovers to 94% with pursed lip breathing.    Recommendations for follow up therapy are one component of a multi-disciplinary discharge planning process, led by the attending physician.  Recommendations may be updated based on patient status, additional functional criteria and insurance authorization.    Follow Up Recommendations  SNF;Supervision - Intermittent;Other (comment) (progressing towards HHOT follow-up at ALF)    Equipment Recommendations  None recommended by OT    Recommendations for Other Services      Precautions / Restrictions Precautions Precautions: Fall Precaution Comments: fell in bathroom 2 wks ago and fell off bed 1 wk ago; monitor O2 - does not wear at baseline Restrictions Weight Bearing Restrictions: No       Mobility Bed Mobility Overal bed mobility: Needs Assistance Bed  Mobility: Supine to Sit;Sit to Supine     Supine to sit: HOB elevated;Supervision Sit to supine: Supervision;HOB elevated        Transfers Overall transfer level: Needs assistance Equipment used: Rolling walker (2 wheeled) Transfers: Sit to/from Stand Sit to Stand: Supervision         General transfer comment: no assist to power up, able to problem solve hand placement    Balance Overall balance assessment: Needs assistance Sitting-balance support: Feet supported;No upper extremity supported Sitting balance-Leahy Scale: Good     Standing balance support: Bilateral upper extremity supported Standing balance-Leahy Scale: Fair Standing balance comment: fair static standing for clothing mgmt, use of BUE support for mobility                           ADL either performed or assessed with clinical judgement   ADL Overall ADL's : Needs assistance/impaired     Grooming: Set up;Sitting;Oral care;Wash/dry face;Brushing hair Grooming Details (indicate cue type and reason): at sink, pt declined to attempt this task in standing                       Toileting - Clothing Manipulation Details (indicate cue type and reason): used purewick in session     Functional mobility during ADLs: Min guard;Rolling walker General ADL Comments: Session focused on progressing functional mobility to maximize independence and safety with ADL tasks. Pt continues to desat and require supplemental O2 with activity     Vision  Vision Assessment?: No apparent visual deficits   Perception     Praxis      Cognition Arousal/Alertness: Awake/alert Behavior During Therapy: WFL for tasks assessed/performed Overall Cognitive Status: Within Functional Limits for tasks assessed                                          Exercises     Shoulder Instructions       General Comments SpO2 desats to 86% on 1 L O2 with activity, recovers to 94% with pursed lip  breathing. 86% on RA with grooming tasks seated at sink, also recovers with pursed lip breathing    Pertinent Vitals/ Pain       Pain Assessment: Faces Faces Pain Scale: Hurts a little bit Pain Location: L LE Pain Descriptors / Indicators: Discomfort;Grimacing Pain Intervention(s): Monitored during session;RN gave pain meds during session  Home Living                                          Prior Functioning/Environment              Frequency  Min 2X/week        Progress Toward Goals  OT Goals(current goals can now be found in the care plan section)  Progress towards OT goals: Progressing toward goals  Acute Rehab OT Goals Patient Stated Goal: get back to being active at her facility OT Goal Formulation: With patient Time For Goal Achievement: 04/17/21 Potential to Achieve Goals: Good ADL Goals Pt Will Perform Grooming: with modified independence;standing Pt Will Perform Lower Body Bathing: with min assist;sit to/from stand;sitting/lateral leans;with adaptive equipment Pt Will Perform Lower Body Dressing: with min assist;sitting/lateral leans;sit to/from stand;with adaptive equipment Pt Will Transfer to Toilet: with supervision;stand pivot transfer;bedside commode Pt/caregiver will Perform Home Exercise Program: Increased strength;Both right and left upper extremity;With theraband;Independently;With written HEP provided  Plan Discharge plan remains appropriate    Co-evaluation                 AM-PAC OT "6 Clicks" Daily Activity     Outcome Measure   Help from another person eating meals?: None Help from another person taking care of personal grooming?: A Little Help from another person toileting, which includes using toliet, bedpan, or urinal?: A Little Help from another person bathing (including washing, rinsing, drying)?: A Lot Help from another person to put on and taking off regular upper body clothing?: A Little Help from another  person to put on and taking off regular lower body clothing?: A Little 6 Click Score: 18    End of Session Equipment Utilized During Treatment: Gait belt;Rolling walker;Oxygen  OT Visit Diagnosis: Unsteadiness on feet (R26.81);Other abnormalities of gait and mobility (R26.89);Muscle weakness (generalized) (M62.81);History of falling (Z91.81)   Activity Tolerance Patient tolerated treatment well   Patient Left in bed;with call bell/phone within reach;with family/visitor present   Nurse Communication Mobility status        Time: [id number] OT Time Calculation (min): 27 min  Charges: OT General Charges $OT Visit: 1 Visit OT Treatments $Self Care/Home Management : 23-37 mins  Malachy Chamber, OTR/L Acute Rehab Services Office: [phone]   Layla Maw 04/08/2021, 9:43 AM

## 2021-04-08 NOTE — TOC Progression Note (Signed)
Transition of Care [facility]) - Progression Note    Patient Details  Name: Shirley Leblanc MRN: [id number] Date of Birth: [birth year]  [facility] CM/SW Contact  Zenon Mayo, RN Phone Number: 04/08/2021, 1:01 PM  Clinical Narrative:    Per CSW  patient wants Hospice with [facility], NCM made referral to [place] with [facility], she will see if she can come to speak with patient before she goes back to [place].   Expected Discharge Plan: [place] Barriers to Discharge: Continued Medical Work up  Expected Discharge Plan and Services Expected Discharge Plan: [place] In-house Referral: Clinical Social Work Discharge Planning Services: NA Post Acute Care Choice: [place] Living arrangements for the past 2 months: [place] Expected Discharge Date: 04/08/21                                     Social Determinants of Health (SDOH) Interventions    Readmission Risk Interventions No flowsheet data found.

## 2021-04-08 NOTE — Progress Notes (Signed)
Spoke and provided report to [place] from [facility] facility. Shawna did not have questions for the nurse. [place] made aware of wound care and prescription in the discharge summary packet.

## 2021-04-08 NOTE — Discharge Summary (Addendum)
Physician Discharge Summary  Shirley Leblanc UXN:[id number] DOB: 04/18/1946 DOA: 04/02/2021  PCP: Reymundo Poll, MD  Admit date: 04/02/2021 Discharge date: 04/08/2021  Admitted From: ALF  Discharge disposition: ALF with hospice followup   Recommendations for Outpatient Follow-Up:   Follow up with your primary care provider at the ALF in 3 to 5 days. Check CBC, BMP, magnesium in the next visit Encouraged to use elastic compression stockings and leg elevation to reduce edema. Dose of torsemide has been increased to 20 mg twice a day. Please consider hospice evaluation at ALF   Discharge Diagnosis:   Principal Problem:   Acute on chronic diastolic CHF (congestive heart failure) (HCC) Active Problems:   Hypertension associated with diabetes ([place])   Atrial fibrillation (HCC)   Hypothyroid   Hyperlipidemia associated with type 2 diabetes mellitus (HCC)   Chronic back pain   Stasis dermatitis of both legs   Discharge Condition: Improved.  Diet recommendation: Low sodium, heart healthy.  Fluid restriction 1500 mL/day  Wound care: None.  Code status: DNR   History of Present Illness:   Shirley Leblanc is a 75 y.o. female with past medical history significant for COPD, hypertension, hyperlipidemia and obesity presented to hospital with shortness of breath.  Patient had been having some symptoms for several months now.  She has had recurrent cough treated with antibiotic x3 in the last 6 months and also complained of left lower quadrant abdominal pain and myalgia.  Patient did have history of COVID and pneumonia in the past.  In the ED, patient was noted to be hypoxic with pulse ox of saturation 80%.  BNP was elevated.  Chest x-ray showed pulmonary vascular congestion with mildly elevated troponin.  Patient also had mild wheezing and was given duo nebs and was admitted to the hospital.   Hospital Course:   Following conditions were addressed during hospitalization as listed  below,  Acute on chronic diastolic CHF Improved with significant diuresis during hospitalization.  Patient has been transitioned to torsemide 76m twice daily.  Received Zaroxolyn during hospitalization with significant diuresis.   2D echocardiogram with LV ejection fraction of 55 to 60%. Continue lisinopril, Coreg.    Mild elevated troponin.  No chest pain. EKG unremarkable.  Likely demand ischemia.  Flat troponins.  Continue low-dose Coreg on discharge..     Essential HTN On lisinopril, Flomax.  Low-dose Coreg was added on admission.  Blood pressure stable.  Continue on discharge.   Hyperlipidemia Continue Lipitor.   History of atrial fibrillation. Was not on anticoagulation for unknown reason.  Currently on aspirin 81 mg daily.  Aspirin on discharge.   Reported history of COPD Consider bronchodilators if necessary.  Mood disorder Continue Buspar, Ativan, Zoloft   Hypothyroidism -Continue Synthroid   Stasis dermatitis Continue Eucerin, could consider Unna boot if needed as outpatient.  Would encourage elastic compression bandage.   Chronic pain Complains of back and left shoulder pain. Continue Percocet.  Patient is chronically on Percocet.  Generalized weakness, debility. PT saw the patient and recommended skilled nursing facility placement.  Disposition.  At this time, patient is stable for disposition to skilled nursing facility with hospice consideration.  Spoke with the patient's granddaughter prior to discharge.  Medical Consultants:   None.  Procedures:    None Subjective:   Today, patient seen and examined bedside.  Complains of mild pain but breathing has improved.  Discharge Exam:   Vitals:   04/08/21 1026 04/08/21 1050  BP: 103/74   Pulse:  89   Resp: 20   Temp:  (!) 97.5 F (36.4 C)  SpO2:     Vitals:   04/08/21 0623 04/08/21 0625 04/08/21 1026 04/08/21 1050  BP: (!) 113/59 (!) 126/57 103/74   Pulse: 96 90 89   Resp: _0 Temp: 98.2 F  (36.8 C) 98.2 F (36.8 C)  (!) 97.5 F (36.4 C)  TempSrc: Oral Oral  Oral  SpO2: (!) 89% 91%    Weight:  116.9 kg    Height:        General: Alert awake, not in obvious distress, morbidly obese, HENT: pupils equally reacting to light,  No scleral pallor or icterus noted. Oral mucosa is moist.  Chest:    Diminished breath sounds bilaterally. No crackles or wheezes.  CVS: S1 &S2 heard. No murmur.  Regular rate and rhythm. Abdomen: Soft, nontender, nondistended.  Bowel sounds are heard.   Extremities: No cyanosis, clubbing but with bilateral lower extremity edema secondary to chronic venous insufficiency.  Peripheral pulses are palpable. Psych: Alert, awake and communicative, normal mood CNS:  No cranial nerve deficits.  Power equal in all extremities.   Skin: Warm and dry.  Chronic venous insufficiency on the lower extremities.  The results of significant diagnostics from this hospitalization (including imaging, microbiology, ancillary and laboratory) are listed below for reference.     Diagnostic Studies:   DG Chest Portable 1 View  Result Date: 04/02/2021 CLINICAL DATA:  Shortness of breath for 2 weeks, history of COPD EXAM: PORTABLE CHEST 1 VIEW COMPARISON:  Chest radiograph 12/25/2020 FINDINGS: The heart is mildly enlarged, unchanged. The mediastinal contours are within normal limits. Lung volumes are low. There is vascular congestion without overt pulmonary edema. Linear opacities in the left mid lung likely reflect subsegmental atelectasis or scar. There is no other focal airspace disease. There is no significant pleural effusion. There is no pneumothorax. Cervical spine fusion hardware and spinal stimulator are noted. There is no acute osseous abnormality. IMPRESSION: Unchanged cardiomegaly with vascular congestion but no overt pulmonary edema. Electronically Signed   By: Valetta Mole M.D.   On: 04/02/2021 08:19   ECHOCARDIOGRAM COMPLETE  Result Date: 04/02/2021    ECHOCARDIOGRAM  REPORT   Patient Name:   Shirley Leblanc Date of Exam: 04/02/2021 Medical Rec #:  [id number]     Height:       66.0 in Accession #:    [id number]    Weight:       285.0 lb Date of Birth:  07/11/1946      BSA:          2.325 m Patient Age:    61 years      BP:           130/63 mmHg Patient Gender: F             HR:           74 bpm. Exam Location:  Inpatient Procedure: 2D Echo Indications:    acute diastolic chf  History:        Patient has prior history of Echocardiogram examinations, most                 recent 09/16/2017. CAD, Arrythmias:Atrial Fibrillation; Risk                 Factors:Diabetes, Hypertension and Dyslipidemia.  Sonographer:    Johny Chess RDCS Referring Phys: [place]  1. Left ventricular ejection fraction, by estimation,  is 55 to 60%. The left ventricle has normal function. The left ventricle has no regional wall motion abnormalities. Left ventricular diastolic function could not be evaluated.  2. Right ventricular systolic function is mildly reduced. The right ventricular size is mildly enlarged. There is mildly elevated pulmonary artery systolic pressure.  3. Right atrial size was mildly dilated.  4. The mitral valve is grossly normal. Mild to moderate mitral valve regurgitation.  5. Tricuspid valve regurgitation is moderate to severe.  6. The aortic valve is tricuspid. Aortic valve regurgitation is not visualized. FINDINGS  Left Ventricle: Left ventricular ejection fraction, by estimation, is 55 to 60%. The left ventricle has normal function. The left ventricle has no regional wall motion abnormalities. The left ventricular internal cavity size was normal in size. There is  no left ventricular hypertrophy. Left ventricular diastolic function could not be evaluated due to atrial fibrillation. Left ventricular diastolic function could not be evaluated. Right Ventricle: The right ventricular size is mildly enlarged. Right vetricular wall thickness was not well visualized.  Right ventricular systolic function is mildly reduced. There is mildly elevated pulmonary artery systolic pressure. The tricuspid regurgitant velocity is 2.76 m/s, and with an assumed right atrial pressure of 10 mmHg, the estimated right ventricular systolic pressure is 72.5 mmHg. Left Atrium: Left atrial size was normal in size. Right Atrium: Right atrial size was mildly dilated. Pericardium: There is no evidence of pericardial effusion. Mitral Valve: The mitral valve is grossly normal. Mild to moderate mitral valve regurgitation. Tricuspid Valve: The tricuspid valve is grossly normal. Tricuspid valve regurgitation is moderate to severe. Aortic Valve: The aortic valve is tricuspid. Aortic valve regurgitation is not visualized. Pulmonic Valve: The pulmonic valve was grossly normal. Pulmonic valve regurgitation is not visualized. No evidence of pulmonic stenosis. Aorta: The aortic root and ascending aorta are structurally normal, with no evidence of dilitation. IAS/Shunts: The atrial septum is grossly normal.  LEFT VENTRICLE PLAX 2D LVIDd:         5.10 cm LVIDs:         3.70 cm LV PW:         1.10 cm LV IVS:        1.00 cm LVOT diam:     2.00 cm LV SV:         56 LV SV Index:   24 LVOT Area:     3.14 cm  RIGHT VENTRICLE         IVC TAPSE (M-mode): 1.6 cm  IVC diam: 2.80 cm LEFT ATRIUM             Index       RIGHT ATRIUM           Index LA diam:        5.00 cm 2.15 cm/m  RA Area:     19.00 cm LA Vol (A2C):   83.2 ml 35.79 ml/m RA Volume:   54.00 ml  23.23 ml/m LA Vol (A4C):   70.7 ml 30.41 ml/m LA Biplane Vol: 77.8 ml 33.47 ml/m  AORTIC VALVE LVOT Vmax:   97.40 cm/s LVOT Vmean:  65.600 cm/s LVOT VTI:    0.177 m  AORTA Ao Root diam: 2.70 cm TRICUSPID VALVE TR Peak grad:   30.5 mmHg TR Vmax:        276.00 cm/s  SHUNTS Systemic VTI:  0.18 m Systemic Diam: 2.00 cm Mertie Moores MD Electronically signed by Mertie Moores MD Signature Date/Time: 04/02/2021/2:31:34 PM    Final  Labs:   Basic Metabolic  Panel: Recent Labs  Lab 04/04/21 0327 04/05/21 0356 04/06/21 0329 04/07/21 0205 04/08/21 0449  NA 136 137 139 137 136  K 4.2 4.2 3.5 3.6 3.5  CL 95* 96* 90* 88* 89*  CO2 35* 37* 41* 41* 37*  GLUCOSE 111* 104* 111* 108* 136*  BUN 22 25* _0 CREATININE 1.00 0.87 0.85 0.90 1.00  CALCIUM 9.5 9.0 9.9 9.9 9.9  MG 1.7 1.7 1.7 1.7 1.7  PHOS 3.4 3.9  --   --   --    GFR Estimated Creatinine Clearance: 62.2 mL/min (by C-G formula based on SCr of 1 mg/dL). Liver Function Tests: Recent Labs  Lab 04/02/21 0818  AST 18  ALT 11  ALKPHOS 83  BILITOT 0.8  PROT 6.4*  ALBUMIN 3.4*   No results for input(s): LIPASE, AMYLASE in the last 168 hours. No results for input(s): AMMONIA in the last 168 hours. Coagulation profile No results for input(s): INR, PROTIME in the last 168 hours.  CBC: Recent Labs  Lab 04/02/21 0818 04/03/21 0137 04/04/21 0327 04/05/21 0356 04/06/21 0329 04/07/21 0205 04/08/21 0449  WBC 5.7   < > 6.3 5.2 5.6 5.9 7.4  NEUTROABS 4.0  --   --   --   --   --   --   HGB 11.2*   < > 10.5* 10.2* 11.2* 11.9* 12.7  HCT 37.4   < > 35.7* 34.8* 36.7 38.0 40.6  MCV 94.2   < > 94.2 94.6 90.8 88.8 87.7  PLT 118*   < > 127* 125* 144* 154 166   < > = values in this interval not displayed.   Cardiac Enzymes: No results for input(s): CKTOTAL, CKMB, CKMBINDEX, TROPONINI in the last 168 hours. BNP: Invalid input(s): POCBNP CBG: Recent Labs  Lab 04/07/21 1125 04/07/21 1603 04/07/21 2112 04/08/21 0620 04/08/21 1052  GLUCAP 115* 130* 132* 128* 109*   D-Dimer No results for input(s): DDIMER in the last 72 hours. Hgb A1c No results for input(s): HGBA1C in the last 72 hours. Lipid Profile No results for input(s): CHOL, HDL, LDLCALC, TRIG, CHOLHDL, LDLDIRECT in the last 72 hours. Thyroid function studies No results for input(s): TSH, T4TOTAL, T3FREE, THYROIDAB in the last 72 hours.  Invalid input(s): FREET3 Anemia work up No results for input(s): VITAMINB12,  FOLATE, FERRITIN, TIBC, IRON, RETICCTPCT in the last 72 hours. Microbiology Recent Results (from the past 240 hour(s))  SARS CORONAVIRUS 2 (TAT 6-24 HRS) Nasopharyngeal Nasopharyngeal Swab     Status: None   Collection Time: 04/02/21 12:24 PM   Specimen: Nasopharyngeal Swab  Result Value Ref Range Status   SARS Coronavirus 2 NEGATIVE NEGATIVE Final    Comment: (NOTE) SARS-CoV-2 target nucleic acids are NOT DETECTED.  The SARS-CoV-2 RNA is generally detectable in upper and lower respiratory specimens during the acute phase of infection. Negative results do not preclude SARS-CoV-2 infection, do not rule out co-infections with other pathogens, and should not be used as the sole basis for treatment or other patient management decisions. Negative results must be combined with clinical observations, patient history, and epidemiological information. The expected result is Negative.  Fact Sheet for Patients: SugarRoll.be  Fact Sheet for Healthcare Providers: https://www.woods-mathews.com/  This test is not yet approved or cleared by the [place] FDA and  has been authorized for detection and/or diagnosis of SARS-CoV-2 by FDA under an Emergency Use Authorization (EUA). This EUA will remain  in effect (meaning this test can  be used) for the duration of the COVID-19 declaration under Se ction 564(b)(1) of the Act, 21 U.S.C. section 360bbb-3(b)(1), unless the authorization is terminated or revoked sooner.  Performed at [facility], [place] [address], [place], [place] [address]      Discharge Instructions:   Discharge Instructions     (HEART FAILURE PATIENTS) Call MD:  Anytime you have any of the following symptoms: 1) 3 pound weight gain in 24 hours or 5 pounds in 1 week 2) shortness of breath, with or without a dry hacking cough 3) swelling in the hands, feet or stomach 4) if you have to sleep on extra pillows at night in order to  breathe.   Complete by: As directed    Avoid straining   Complete by: As directed    Diet - low sodium heart healthy   Complete by: As directed    Fluid restriction 1524m/day   Discharge instructions   Complete by: As directed    Follow up with your primary care provider at the Skilled nursing facility in one week. Consider hospice at the center. Check blood work at that next visit. Compression stockings to the legs, elevate when lying down or sitting.   Heart Failure patients record your daily weight using the same scale at the same time of day   Complete by: As directed    Increase activity slowly   Complete by: As directed    STOP any activity that causes chest pain, shortness of breath, dizziness, sweating, or exessive weakness   Complete by: As directed       Allergies as of 04/08/2021       Reactions   Iodinated Diagnostic Agents Anaphylaxis   Penicillins Hives, Itching, Rash   Has patient had a PCN reaction causing immediate rash, facial/tongue/throat swelling, SOB or lightheadedness with hypotension Yes Has patient had a PCN reaction causing severe rash involving mucus membranes or skin necrosis: Yes Has patient had a PCN reaction that required hospitalization No Has patient had a PCN reaction occurring within the last 10 years: No If all of the above answers are "NO", then may proceed with Cephalosporin use.   Sulfa Antibiotics Hives, Itching, Rash   Penicillin G Hives, Itching, Rash        Medication List     TAKE these medications    (feeding supplement) PROSource Plus liquid Take 30 mLs by mouth 2 (two) times daily between meals.   acetaminophen 325 MG tablet Commonly known as: TYLENOL Take 650 mg by mouth every 12 (twelve) hours as needed for mild pain or fever.   albuterol 108 (90 Base) MCG/ACT inhaler Commonly known as: VENTOLIN HFA TAKE 2 PUFFS BY MOUTH EVERY 6 HOURS AS NEEDED FOR WHEEZE OR SHORTNESS OF BREATH What changed: See the new instructions.    aspirin 81 MG EC tablet Take 1 tablet (81 mg total) by mouth daily. Swallow whole. Start taking on: April 09, 2021   atorvastatin 10 MG tablet Commonly known as: LIPITOR Take 10 mg by mouth daily.   blood glucose meter kit and supplies Kit Dispense per insurance preference. Use up to four times daily . E 11.9   busPIRone 10 MG tablet Commonly known as: BUSPAR Take 20 mg by mouth 3 (three) times daily.   carvedilol 3.125 MG tablet Commonly known as: COREG Take 1 tablet (3.125 mg total) by mouth 2 (two) times daily with a meal.   diclofenac Sodium 1 % Gel Commonly known as: VOLTAREN Apply  1 application topically 2 (two) times daily as needed (neck and shoulder pain).   docusate sodium 100 MG capsule Commonly known as: COLACE Take 1 capsule (100 mg total) by mouth 2 (two) times daily as needed for mild constipation.   glucose blood test strip Commonly known as: Glucose Meter Test Use BID   levothyroxine 75 MCG tablet Commonly known as: SYNTHROID Take 75 mcg by mouth daily before breakfast.   lisinopril 40 MG tablet Commonly known as: ZESTRIL Take 40 mg by mouth daily.   loperamide 2 MG tablet Commonly known as: IMODIUM A-D Take 2 mg by mouth 3 (three) times daily as needed for diarrhea or loose stools.   LORazepam 1 MG tablet Commonly known as: ATIVAN Take 0.5 mg by mouth 2 (two) times daily.   multivitamin with minerals Tabs tablet Take 1 tablet by mouth daily. Start taking on: April 09, 2021   ondansetron 8 MG tablet Commonly known as: ZOFRAN Take 8 mg by mouth every 8 (eight) hours as needed for nausea or vomiting.   oxyCODONE-acetaminophen 10-325 MG tablet Commonly known as: PERCOCET Take 1 tablet by mouth in the morning, at noon, in the evening, and at bedtime.   pantoprazole 40 MG tablet Commonly known as: PROTONIX Take 40 mg by mouth every evening.   polyethylene glycol 17 g packet Commonly known as: MIRALAX / GLYCOLAX Take 17 g by mouth  daily. (0800)   Preparation H 1 % Generic drug: hydrocortisone cream Apply 1 application topically 2 (two) times daily as needed for itching.   psyllium 0.52 g capsule Commonly known as: REGULOID Take 0.52 g by mouth at bedtime. (2000)   Salonpas 3.08-02-08 % Ptch Generic drug: Camphor-Menthol-Methyl Sal Apply 1 patch topically every 12 (twelve) hours as needed (pain).   senna 8.6 MG tablet Commonly known as: SENOKOT Take 2 tablets by mouth at bedtime. (2000)   sertraline 100 MG tablet Commonly known as: ZOLOFT Take 200 mg by mouth daily.   simethicone 125 MG chewable tablet Commonly known as: MYLICON Chew 867 mg by mouth every 6 (six) hours as needed for flatulence.   sodium chloride 0.65 % Soln nasal spray Commonly known as: OCEAN Place 1 spray into both nostrils as needed for congestion (nose irritation).   tamsulosin 0.4 MG Caps capsule Commonly known as: FLOMAX Take 0.4 mg by mouth at bedtime. (2100)   torsemide 20 MG tablet Commonly known as: DEMADEX Take 1 tablet (20 mg total) by mouth 2 (two) times daily. What changed: when to take this        Follow-up Information     Reymundo Poll, MD Follow up.   Specialty: Family Medicine Contact information: [address] [place] [address] [phone]                  Time coordinating discharge: 39 minutes  Signed:  Gennie Dib  [facility] 04/08/2021, 11:37 AM

## 2021-04-10 DIAGNOSIS — J449 Chronic obstructive pulmonary disease, unspecified: Secondary | ICD-10-CM

## 2021-06-07 ENCOUNTER — Encounter (HOSPITAL_COMMUNITY): Payer: Self-pay | Admitting: Oncology

## 2021-06-07 ENCOUNTER — Other Ambulatory Visit: Payer: Self-pay

## 2021-06-07 ENCOUNTER — Emergency Department (HOSPITAL_COMMUNITY)

## 2021-06-07 ENCOUNTER — Emergency Department (HOSPITAL_COMMUNITY)
Admission: EM | Admit: 2021-06-07 | Discharge: 2021-06-07 | Disposition: A | Discharge status: Home / Self Care | Attending: Emergency Medicine | Admitting: Emergency Medicine

## 2021-06-07 DIAGNOSIS — S93601A Unspecified sprain of right foot, initial encounter: Secondary | ICD-10-CM

## 2021-06-07 DIAGNOSIS — Z79899 Other long term (current) drug therapy: Secondary | ICD-10-CM | POA: Diagnosis not present

## 2021-06-07 DIAGNOSIS — W1839XA Other fall on same level, initial encounter: Secondary | ICD-10-CM | POA: Diagnosis not present

## 2021-06-07 DIAGNOSIS — E785 Hyperlipidemia, unspecified: Secondary | ICD-10-CM | POA: Insufficient documentation

## 2021-06-07 DIAGNOSIS — I5042 Chronic combined systolic (congestive) and diastolic (congestive) heart failure: Secondary | ICD-10-CM

## 2021-06-07 DIAGNOSIS — Z7901 Long term (current) use of anticoagulants: Secondary | ICD-10-CM | POA: Insufficient documentation

## 2021-06-07 DIAGNOSIS — I4891 Unspecified atrial fibrillation: Secondary | ICD-10-CM | POA: Insufficient documentation

## 2021-06-07 DIAGNOSIS — I110 Hypertensive heart disease with heart failure: Secondary | ICD-10-CM | POA: Insufficient documentation

## 2021-06-07 DIAGNOSIS — M79671 Pain in right foot: Secondary | ICD-10-CM | POA: Insufficient documentation

## 2021-06-07 DIAGNOSIS — E1169 Type 2 diabetes mellitus with other specified complication: Secondary | ICD-10-CM | POA: Insufficient documentation

## 2021-06-07 DIAGNOSIS — I878 Other specified disorders of veins: Secondary | ICD-10-CM

## 2021-06-07 DIAGNOSIS — I5033 Acute on chronic diastolic (congestive) heart failure: Secondary | ICD-10-CM | POA: Diagnosis not present

## 2021-06-07 DIAGNOSIS — Z96659 Presence of unspecified artificial knee joint: Secondary | ICD-10-CM | POA: Diagnosis not present

## 2021-06-07 DIAGNOSIS — Z7982 Long term (current) use of aspirin: Secondary | ICD-10-CM | POA: Insufficient documentation

## 2021-06-07 DIAGNOSIS — J449 Chronic obstructive pulmonary disease, unspecified: Secondary | ICD-10-CM | POA: Diagnosis not present

## 2021-06-07 DIAGNOSIS — E039 Hypothyroidism, unspecified: Secondary | ICD-10-CM | POA: Diagnosis not present

## 2021-06-07 DIAGNOSIS — I2510 Atherosclerotic heart disease of native coronary artery without angina pectoris: Secondary | ICD-10-CM | POA: Diagnosis not present

## 2021-06-07 DIAGNOSIS — S9031XA Contusion of right foot, initial encounter: Secondary | ICD-10-CM

## 2021-06-07 MED ORDER — ACETAMINOPHEN 500 MG PO TABS
1000.0000 mg | ORAL_TABLET | Freq: Once | ORAL | Status: AC
  Administered 2021-06-07: 1000 mg via ORAL
  Filled 2021-06-07: qty 2

## 2021-06-07 NOTE — Discharge Instructions (Signed)
It was our pleasure to provide your ER care today - we hope that you feel better.  Your foot xray was read as showing no acute fracture. Elevate. Take acetaminophen as need.   Follow up closely with your doctor and hospice care team in the next couple days.  Return to ER if worse, or should you reconsider and wish to have further ED testing done (labs and xrays), further evaluation of your breathing, and consideration of possible admission.

## 2021-06-07 NOTE — ED Triage Notes (Signed)
Pt bib [facility] from [facility] d/t fall.  Pt was transferring from motorized wheelchair when she lost balance and fell injuring right foot. EMS reports scratch to pt's back.

## 2021-06-07 NOTE — ED Notes (Signed)
Pt's daughter [name]) phone: [phone]

## 2021-06-07 NOTE — ED Notes (Signed)
Pt's O2 noted to be in the low 80's.  Pt declined O2 en route.  Pt agreeable to placing O2 on.  2L O2 via [place] placed on pt w/ improvement in O2 sat to 93%.

## 2021-06-07 NOTE — ED Provider Notes (Signed)
[facility] DEPT Provider Note   CSN: [id number] Arrival date & time: 06/07/21  1823     History Chief Complaint  Patient presents with   Shirley Leblanc is a 75 y.o. female.  Pt c/o injury to right foot just pta today. Was transferring from wheelchair. C/o contusion and ?twisting to right foot. C/o mid right foot pain, symptoms acute onset, moderate, dull, worse w palpation. Skin intact. Denies other pain or injury from incident. No associated numbness/weakness.   The history is provided by the patient and medical records.  Fall Pertinent negatives include no chest pain, no abdominal pain, no headaches and no shortness of breath.      Past Medical History:  Diagnosis Date   Bursitis    CAD (coronary artery disease)    a. nonobstructive CAD with 40% Prox LAD stenosis by cath in 2007   COPD (chronic obstructive pulmonary disease) (HCC)    Depression    DJD (degenerative joint disease)    GERD (gastroesophageal reflux disease)    Hyperlipemia    Hypertension    Left wrist fracture    Obesities, morbid ([place])    Open left ankle fracture    Syncope 11/2017   Urinary incontinence     Patient Active Problem List   Diagnosis Date Noted   Chronic obstructive pulmonary disease ([place])    Acute on chronic diastolic CHF (congestive heart failure) ([place]) 04/02/2021   Stasis dermatitis of both legs 04/02/2021   Dizziness    Tremor 09/18/2017   Encephalopathy    Tremors of nervous system    Near syncope 09/16/2017   Syncope 09/15/2017   CAD (coronary artery disease) 09/15/2017   At HIGH RISK for fall 05/15/2017   Chest pain 04/28/2017   Osteopenia 02/02/2017   Morbid obesity ([place]) 11/13/2016   Elevated troponin 11/13/2016   Chronic back pain 10/10/2016   Pain medication agreement signed 10/10/2016   Opioid dependence ([place]) 10/10/2016   Diabetes ([place]) 09/19/2016   Depression 04/18/2016   Hypothyroid 02/22/2016   Hyperlipidemia  associated with type 2 diabetes mellitus ([place]) 02/22/2016   Chronic anticoagulation - Eliquis, CHADS2VASC=3 06/05/2015   Atrial fibrillation ([place]) 05/16/2015   S/P laparoscopic cholecystectomy    Hypertension associated with diabetes ([place]) 05/09/2015   Dyspnea on exertion 03/14/2011    Past Surgical History:  Procedure Laterality Date   CARDIAC CATHETERIZATION     CHOLECYSTECTOMY N/A 05/14/2015   Procedure: LAPAROSCOPIC CHOLECYSTECTOMY;  Surgeon: Coralie Keens, MD;  Location: [facility];  Service: General;  Laterality: N/A;   ESOPHAGOGASTRODUODENOSCOPY (EGD) WITH PROPOFOL N/A 11/11/2019   Procedure: ESOPHAGOGASTRODUODENOSCOPY (EGD) WITH PROPOFOL;  Surgeon: Carol Ada, MD;  Location: [facility];  Service: Endoscopy;  Laterality: N/A;   NECK SURGERY     SAVORY DILATION N/A 11/11/2019   Procedure: SAVORY DILATION;  Surgeon: Carol Ada, MD;  Location: [facility];  Service: Endoscopy;  Laterality: N/A;   TOTAL KNEE ARTHROPLASTY     x2   VAGINAL HYSTERECTOMY       OB History   No obstetric history on file.     Family History  Problem Relation Age of Onset   Lung disease Mother    Heart attack Father    Lung disease Other    Heart attack Other    Cancer Daughter        breast    Social History   Tobacco Use   Smoking status: Never   Smokeless tobacco: Never  Vaping  Use   Vaping Use: Never used  Substance Use Topics   Alcohol use: Yes    Comment: Occasionally    Drug use: No    Home Medications Prior to Admission medications   Medication Sig Start Date End Date Taking? Authorizing Provider  acetaminophen (TYLENOL) 325 MG tablet Take 650 mg by mouth every 12 (twelve) hours as needed for mild pain or fever.    [provider]  albuterol (PROVENTIL HFA;VENTOLIN HFA) 108 (90 Base) MCG/ACT inhaler TAKE 2 PUFFS BY MOUTH EVERY 6 HOURS AS NEEDED FOR WHEEZE OR SHORTNESS OF BREATH Patient taking differently: Inhale 2 puffs into the lungs 4 (four) times daily as  needed for wheezing or shortness of breath. 08/07/17   Evelina Dun A, FNP  aspirin EC 81 MG EC tablet Take 1 tablet (81 mg total) by mouth daily. Swallow whole. 04/09/21   Pokhrel, Corrie Mckusick, MD  atorvastatin (LIPITOR) 10 MG tablet Take 10 mg by mouth daily.    [provider]  blood glucose meter kit and supplies KIT Dispense per insurance preference. Use up to four times daily . E 11.9 09/30/16   Hawks, Christy A, FNP  busPIRone (BUSPAR) 10 MG tablet Take 20 mg by mouth 3 (three) times daily.    [provider]  Camphor-Menthol-Methyl Sal (SALONPAS) 3.08-02-08 % PTCH Apply 1 patch topically every 12 (twelve) hours as needed (pain).    [provider]  carvedilol (COREG) 3.125 MG tablet Take 1 tablet (3.125 mg total) by mouth 2 (two) times daily with a meal. 04/08/21   Pokhrel, Corrie Mckusick, MD  diclofenac Sodium (VOLTAREN) 1 % GEL Apply 1 application topically 2 (two) times daily as needed (neck and shoulder pain). 10/13/19   [provider]  docusate sodium (COLACE) 100 MG capsule Take 1 capsule (100 mg total) by mouth 2 (two) times daily as needed for mild constipation. 04/08/21   Pokhrel, Corrie Mckusick, MD  glucose blood (GLUCOSE METER TEST) test strip Use BID 09/29/16   Evelina Dun A, FNP  hydrocortisone cream (PREPARATION H) 1 % Apply 1 application topically 2 (two) times daily as needed for itching.    [provider]  levothyroxine (SYNTHROID) 75 MCG tablet Take 75 mcg by mouth daily before breakfast.    [provider]  lisinopril (ZESTRIL) 40 MG tablet Take 40 mg by mouth daily.    [provider]  loperamide (IMODIUM A-D) 2 MG tablet Take 2 mg by mouth 3 (three) times daily as needed for diarrhea or loose stools.    [provider]  LORazepam (ATIVAN) 1 MG tablet Take 0.5 mg by mouth 2 (two) times daily.    [provider]  Multiple Vitamin (MULTIVITAMIN WITH MINERALS) TABS tablet Take 1 tablet by mouth daily. 04/09/21   Pokhrel,  Corrie Mckusick, MD  Nutritional Supplements (,FEEDING SUPPLEMENT, PROSOURCE PLUS) liquid Take 30 mLs by mouth 2 (two) times daily between meals. 04/08/21   Pokhrel, Corrie Mckusick, MD  ondansetron (ZOFRAN) 8 MG tablet Take 8 mg by mouth every 8 (eight) hours as needed for nausea or vomiting.    [provider]  oxyCODONE-acetaminophen (PERCOCET) 10-325 MG tablet Take 1 tablet by mouth in the morning, at noon, in the evening, and at bedtime. 04/08/21 04/08/22  Pokhrel, Corrie Mckusick, MD  pantoprazole (PROTONIX) 40 MG tablet Take 40 mg by mouth every evening.    [provider]  polyethylene glycol (MIRALAX / GLYCOLAX) 17 g packet Take 17 g by mouth daily. (0800)    [provider]  psyllium (REGULOID) 0.52 g capsule Take 0.52 g by mouth at bedtime. (2000)    [provider]  senna (SENOKOT) 8.6 MG tablet Take 2 tablets by mouth at bedtime. (2000)    [provider]  sertraline (ZOLOFT) 100 MG tablet Take 200 mg by mouth daily.    [provider]  simethicone (MYLICON) 865 MG chewable tablet Chew 125 mg by mouth every 6 (six) hours as needed for flatulence.     [provider]  sodium chloride (OCEAN) 0.65 % SOLN nasal spray Place 1 spray into both nostrils as needed for congestion (nose irritation). 04/08/21   Pokhrel, Corrie Mckusick, MD  tamsulosin (FLOMAX) 0.4 MG CAPS capsule Take 0.4 mg by mouth at bedtime. (2100) 10/10/19   [provider]  torsemide (DEMADEX) 20 MG tablet Take 1 tablet (20 mg total) by mouth 2 (two) times daily. 04/08/21   Pokhrel, Corrie Mckusick, MD    Allergies    Iodinated diagnostic agents, Penicillins, Sulfa antibiotics, and Penicillin g  Review of Systems   Review of Systems  Constitutional:  Negative for chills and fever.  Eyes:  Negative for redness.  Respiratory:  Negative for shortness of breath.   Cardiovascular:  Positive for leg swelling. Negative for chest pain.  Gastrointestinal:  Negative for abdominal pain and vomiting.   Genitourinary:  Negative for flank pain.  Musculoskeletal:  Negative for back pain and neck pain.  Skin:  Negative for rash.  Neurological:  Negative for headaches.  Hematological:  Does not bruise/bleed easily.  Psychiatric/Behavioral:  Negative for confusion.    Physical Exam Updated Vital Signs BP 127/72 (BP Location: Right Arm)   Pulse 84   Temp 98.3 F (36.8 C) (Oral)   Resp (!) 23   SpO2 (!) 81% Comment: Pt refused O2 en route, pt now agreeable to O2 2L placed via [place]  Physical Exam Vitals and nursing note reviewed.  Constitutional:      Appearance: Normal appearance. She is well-developed.  HENT:     Head: Atraumatic.     Nose: Nose normal.     Mouth/Throat:     Mouth: Mucous membranes are moist.  Eyes:     General: No scleral icterus.    Conjunctiva/sclera: Conjunctivae normal.     Pupils: Pupils are equal, round, and reactive to light.  Neck:     Trachea: No tracheal deviation.  Cardiovascular:     Rate and Rhythm: Normal rate and regular rhythm.     Pulses: Normal pulses.     Heart sounds: Normal heart sounds. No murmur heard.   No friction rub. No gallop.  Pulmonary:     Effort: Pulmonary effort is normal. No respiratory distress.     Breath sounds: Normal breath sounds.  Abdominal:     General: Bowel sounds are normal. There is no distension.     Palpations: Abdomen is soft.     Tenderness: There is no abdominal tenderness.  Genitourinary:    Comments: No cva tenderness.  Musculoskeletal:     Cervical back: Normal range of motion and neck supple. No rigidity. No muscular tenderness.     Comments: Moderate bilateral foot, ankle and leg edema (pt indicates is baseline). Chronic venous stasis changes bil legs. Tenderness right mid foot. Skin intact. Distal pulses palp. Normal cap refill in toes.   Skin:    General: Skin is warm and dry.     Findings: No rash.  Neurological:     Mental Status: She is alert.  Comments: Alert, speech normal.    Psychiatric:        Mood and Affect: Mood normal.    ED Results / Procedures / Treatments   Labs (all labs ordered are listed, but only abnormal results are displayed) Labs Reviewed - No data to display  EKG None  Radiology DG Foot Complete Right  Result Date: 06/07/2021 CLINICAL DATA:  Right foot pain, fall EXAM: RIGHT FOOT COMPLETE - 3+ VIEW COMPARISON:  None. FINDINGS: There are healed fractures involving the base of the first through third proximal phalanges and distal metadiaphyseal junctions of the second through fifth metatarsals. Normal overall alignment. No definite acute fracture or dislocation. Mild degenerative changes are noted within the midfoot and forefoot. There is extensive diffuse soft tissue swelling of the right foot. Tiny plantar calcaneal spur. IMPRESSION: Diffuse soft tissue swelling.  No acute fracture or dislocation. Electronically Signed   By: Fidela Salisbury M.D.   On: 06/07/2021 19:59    Procedures Procedures   Medications Ordered in ED Medications - No data to display  ED Course  I have reviewed the triage vital signs and the nursing notes.  Pertinent labs & imaging results that were available during my care of the patient were reviewed by me and considered in my medical decision making (see chart for details).    MDM Rules/Calculators/A&P                          Xrays.   Reviewed nursing notes and prior charts for additional history. Recent admission reviewed. DNR wishes/hospice care noted.   Xrays reviewed/interpreted by me - no fx.   Discussed xray w pt. Acetaminophen po. Icepack.   Also discussed low pulse ox w pt, and discussed/recommended further ED workup, o2, possible admission, etc.  Patient indicates she feels her breathing is at baseline. States o2 recommended in past, but that she feels it bothers her/doesn't like using/wearing Caro, and that she does not want evaluation for any o2 therapy. Patient indicates is with hospice care, and  that she does not want further ED or in hospital evaluation, including for o2 level,  or for chf, etc. Patient is alert, oriented, appears to have capacity/ability to make decisions regarding her care. Pt continues to insist breathing at baseline, denies any other new c/o except the foot injury/pain, and requests d/c back to her facility/assisted living. Will avoid additional workup, and d/c per her request.     Final Clinical Impression(s) / ED Diagnoses Final diagnoses:  None    Rx / DC Orders ED Discharge Orders     None        Lajean Saver, MD 06/07/21 2030

## 2021-07-14 ENCOUNTER — Other Ambulatory Visit: Payer: Self-pay

## 2021-07-14 ENCOUNTER — Emergency Department (HOSPITAL_COMMUNITY)

## 2021-07-14 ENCOUNTER — Encounter (HOSPITAL_COMMUNITY): Payer: Self-pay | Admitting: Emergency Medicine

## 2021-07-14 ENCOUNTER — Inpatient Hospital Stay (HOSPITAL_COMMUNITY)
Admission: EM | Admit: 2021-07-14 | Discharge: 2021-07-19 | DRG: 291 | Disposition: A | Discharge status: Hospice | Source: Skilled Nursing Facility | Attending: Internal Medicine | Admitting: Internal Medicine

## 2021-07-14 DIAGNOSIS — Z91041 Radiographic dye allergy status: Secondary | ICD-10-CM

## 2021-07-14 DIAGNOSIS — D696 Thrombocytopenia, unspecified: Secondary | ICD-10-CM | POA: Diagnosis present

## 2021-07-14 DIAGNOSIS — I5033 Acute on chronic diastolic (congestive) heart failure: Secondary | ICD-10-CM | POA: Diagnosis present

## 2021-07-14 DIAGNOSIS — G8929 Other chronic pain: Secondary | ICD-10-CM | POA: Diagnosis present

## 2021-07-14 DIAGNOSIS — Z79891 Long term (current) use of opiate analgesic: Secondary | ICD-10-CM

## 2021-07-14 DIAGNOSIS — M549 Dorsalgia, unspecified: Secondary | ICD-10-CM | POA: Diagnosis present

## 2021-07-14 DIAGNOSIS — E1159 Type 2 diabetes mellitus with other circulatory complications: Secondary | ICD-10-CM | POA: Diagnosis present

## 2021-07-14 DIAGNOSIS — R601 Generalized edema: Secondary | ICD-10-CM

## 2021-07-14 DIAGNOSIS — Z79899 Other long term (current) drug therapy: Secondary | ICD-10-CM

## 2021-07-14 DIAGNOSIS — I482 Chronic atrial fibrillation, unspecified: Secondary | ICD-10-CM | POA: Diagnosis present

## 2021-07-14 DIAGNOSIS — I361 Nonrheumatic tricuspid (valve) insufficiency: Secondary | ICD-10-CM | POA: Diagnosis present

## 2021-07-14 DIAGNOSIS — R778 Other specified abnormalities of plasma proteins: Secondary | ICD-10-CM | POA: Diagnosis present

## 2021-07-14 DIAGNOSIS — I110 Hypertensive heart disease with heart failure: Secondary | ICD-10-CM | POA: Diagnosis not present

## 2021-07-14 DIAGNOSIS — K219 Gastro-esophageal reflux disease without esophagitis: Secondary | ICD-10-CM | POA: Diagnosis present

## 2021-07-14 DIAGNOSIS — Z66 Do not resuscitate: Secondary | ICD-10-CM | POA: Diagnosis present

## 2021-07-14 DIAGNOSIS — I4891 Unspecified atrial fibrillation: Secondary | ICD-10-CM | POA: Diagnosis present

## 2021-07-14 DIAGNOSIS — J9611 Chronic respiratory failure with hypoxia: Secondary | ICD-10-CM | POA: Diagnosis present

## 2021-07-14 DIAGNOSIS — I272 Pulmonary hypertension, unspecified: Secondary | ICD-10-CM | POA: Diagnosis present

## 2021-07-14 DIAGNOSIS — I509 Heart failure, unspecified: Secondary | ICD-10-CM

## 2021-07-14 DIAGNOSIS — Z882 Allergy status to sulfonamides status: Secondary | ICD-10-CM

## 2021-07-14 DIAGNOSIS — I872 Venous insufficiency (chronic) (peripheral): Secondary | ICD-10-CM | POA: Diagnosis present

## 2021-07-14 DIAGNOSIS — I2510 Atherosclerotic heart disease of native coronary artery without angina pectoris: Secondary | ICD-10-CM | POA: Diagnosis present

## 2021-07-14 DIAGNOSIS — M170 Bilateral primary osteoarthritis of knee: Secondary | ICD-10-CM | POA: Diagnosis present

## 2021-07-14 DIAGNOSIS — E039 Hypothyroidism, unspecified: Secondary | ICD-10-CM | POA: Diagnosis present

## 2021-07-14 DIAGNOSIS — Z8249 Family history of ischemic heart disease and other diseases of the circulatory system: Secondary | ICD-10-CM

## 2021-07-14 DIAGNOSIS — Z9981 Dependence on supplemental oxygen: Secondary | ICD-10-CM

## 2021-07-14 DIAGNOSIS — Z880 Allergy status to penicillin: Secondary | ICD-10-CM

## 2021-07-14 DIAGNOSIS — E119 Type 2 diabetes mellitus without complications: Secondary | ICD-10-CM

## 2021-07-14 DIAGNOSIS — I152 Hypertension secondary to endocrine disorders: Secondary | ICD-10-CM | POA: Diagnosis present

## 2021-07-14 DIAGNOSIS — J449 Chronic obstructive pulmonary disease, unspecified: Secondary | ICD-10-CM | POA: Diagnosis present

## 2021-07-14 DIAGNOSIS — F32A Depression, unspecified: Secondary | ICD-10-CM | POA: Diagnosis present

## 2021-07-14 DIAGNOSIS — M1990 Unspecified osteoarthritis, unspecified site: Secondary | ICD-10-CM | POA: Diagnosis present

## 2021-07-14 DIAGNOSIS — E785 Hyperlipidemia, unspecified: Secondary | ICD-10-CM | POA: Diagnosis present

## 2021-07-14 DIAGNOSIS — Z79890 Hormone replacement therapy (postmenopausal): Secondary | ICD-10-CM

## 2021-07-14 DIAGNOSIS — Z6841 Body Mass Index (BMI) 40.0 and over, adult: Secondary | ICD-10-CM

## 2021-07-14 DIAGNOSIS — Z7982 Long term (current) use of aspirin: Secondary | ICD-10-CM

## 2021-07-14 DIAGNOSIS — Z20822 Contact with and (suspected) exposure to covid-19: Secondary | ICD-10-CM | POA: Diagnosis present

## 2021-07-14 DIAGNOSIS — K5900 Constipation, unspecified: Secondary | ICD-10-CM | POA: Diagnosis present

## 2021-07-14 DIAGNOSIS — E1169 Type 2 diabetes mellitus with other specified complication: Secondary | ICD-10-CM | POA: Diagnosis present

## 2021-07-14 DIAGNOSIS — Z9049 Acquired absence of other specified parts of digestive tract: Secondary | ICD-10-CM

## 2021-07-14 DIAGNOSIS — Z90710 Acquired absence of both cervix and uterus: Secondary | ICD-10-CM

## 2021-07-14 LAB — BRAIN NATRIURETIC PEPTIDE: B Natriuretic Peptide: 215 pg/mL — ABNORMAL HIGH (ref 0.0–100.0)

## 2021-07-14 LAB — RESP PANEL BY RT-PCR (FLU A&B, COVID) ARPGX2
Influenza A by PCR: NEGATIVE
Influenza B by PCR: NEGATIVE
SARS Coronavirus 2 by RT PCR: NEGATIVE

## 2021-07-14 LAB — CBC
HCT: 35.5 % — ABNORMAL LOW (ref 36.0–46.0)
Hemoglobin: 10.5 g/dL — ABNORMAL LOW (ref 12.0–15.0)
MCH: 26.9 pg (ref 26.0–34.0)
MCHC: 29.6 g/dL — ABNORMAL LOW (ref 30.0–36.0)
MCV: 91 fL (ref 80.0–100.0)
Platelets: 116 10*3/uL — ABNORMAL LOW (ref 150–400)
RBC: 3.9 MIL/uL (ref 3.87–5.11)
RDW: 17.1 % — ABNORMAL HIGH (ref 11.5–15.5)
WBC: 4.2 10*3/uL (ref 4.0–10.5)
nRBC: 0 % (ref 0.0–0.2)

## 2021-07-14 LAB — BASIC METABOLIC PANEL
Anion gap: 8 (ref 5–15)
BUN: 12 mg/dL (ref 8–23)
CO2: 30 mmol/L (ref 22–32)
Calcium: 9.2 mg/dL (ref 8.9–10.3)
Chloride: 97 mmol/L — ABNORMAL LOW (ref 98–111)
Creatinine, Ser: 0.89 mg/dL (ref 0.44–1.00)
GFR, Estimated: >60 mL/min (ref >60)
Glucose, Bld: 94 mg/dL (ref 70–99)
Potassium: 4.1 mmol/L (ref 3.5–5.1)
Sodium: 135 mmol/L (ref 135–145)

## 2021-07-14 LAB — TROPONIN I (HIGH SENSITIVITY)
Troponin I (High Sensitivity): 22 ng/L — ABNORMAL HIGH (ref <18)
Troponin I (High Sensitivity): 23 ng/L — ABNORMAL HIGH (ref <18)

## 2021-07-14 MED ORDER — DOCUSATE SODIUM 100 MG PO CAPS
100.0000 mg | ORAL_CAPSULE | Freq: Two times a day (BID) | ORAL | Status: DC
  Administered 2021-07-14 – 2021-07-17 (×7): 100 mg via ORAL
  Filled 2021-07-14 (×7): qty 1

## 2021-07-14 MED ORDER — OXYCODONE-ACETAMINOPHEN 10-325 MG PO TABS: 1.0000 | ORAL_TABLET | Freq: Four times a day (QID) | ORAL | Status: DC

## 2021-07-14 MED ORDER — POLYETHYLENE GLYCOL 3350 17 G PO PACK
17.0000 g | PACK | Freq: Every day | ORAL | Status: DC
  Administered 2021-07-15 – 2021-07-19 (×5): 17 g via ORAL
  Filled 2021-07-14 (×5): qty 1

## 2021-07-14 MED ORDER — PSYLLIUM 95 % PO PACK
1.0000 | PACK | Freq: Every day | ORAL | Status: DC
Start: 2021-07-14 — End: 2021-07-19
  Administered 2021-07-14 – 2021-07-18 (×5): 1 via ORAL
  Filled 2021-07-14 (×5): qty 1

## 2021-07-14 MED ORDER — LISINOPRIL 10 MG PO TABS
40.0000 mg | ORAL_TABLET | Freq: Every day | ORAL | Status: DC
  Administered 2021-07-15: 09:00:00 40 mg via ORAL
  Filled 2021-07-14: qty 4

## 2021-07-14 MED ORDER — POLYETHYLENE GLYCOL 3350 17 G PO PACK: 17.0000 g | PACK | Freq: Every day | ORAL | Status: DC | PRN

## 2021-07-14 MED ORDER — LORAZEPAM 1 MG PO TABS
0.5000 mg | ORAL_TABLET | Freq: Two times a day (BID) | ORAL | Status: DC
  Administered 2021-07-15 – 2021-07-19 (×9): 0.5 mg via ORAL
  Filled 2021-07-14 (×9): qty 1

## 2021-07-14 MED ORDER — TRIAMCINOLONE 0.1 % CREAM:EUCERIN CREAM 1:1
TOPICAL_CREAM | Freq: Two times a day (BID) | CUTANEOUS | Status: DC
  Administered 2021-07-17: 1 via TOPICAL
  Filled 2021-07-14 (×2): qty 1

## 2021-07-14 MED ORDER — MORPHINE SULFATE (PF) 2 MG/ML IV SOLN
2.0000 mg | INTRAVENOUS | Status: DC | PRN
  Administered 2021-07-14: 18:00:00 2 mg via INTRAVENOUS
  Filled 2021-07-14: qty 1

## 2021-07-14 MED ORDER — LEVOTHYROXINE SODIUM 88 MCG PO TABS
88.0000 ug | ORAL_TABLET | Freq: Every day | ORAL | Status: DC
  Administered 2021-07-15 – 2021-07-19 (×5): 88 ug via ORAL
  Filled 2021-07-14 (×5): qty 1

## 2021-07-14 MED ORDER — TAMSULOSIN HCL 0.4 MG PO CAPS
0.4000 mg | ORAL_CAPSULE | Freq: Every day | ORAL | Status: DC
  Administered 2021-07-14 – 2021-07-18 (×5): 0.4 mg via ORAL
  Filled 2021-07-14 (×5): qty 1

## 2021-07-14 MED ORDER — SODIUM CHLORIDE 0.9% FLUSH
3.0000 mL | Freq: Two times a day (BID) | INTRAVENOUS | Status: DC
  Administered 2021-07-14 – 2021-07-18 (×8): 3 mL via INTRAVENOUS

## 2021-07-14 MED ORDER — FUROSEMIDE 10 MG/ML IJ SOLN
40.0000 mg | Freq: Once | INTRAMUSCULAR | Status: AC
  Administered 2021-07-14: 13:00:00 40 mg via INTRAVENOUS
  Filled 2021-07-14: qty 4

## 2021-07-14 MED ORDER — SERTRALINE HCL 100 MG PO TABS
200.0000 mg | ORAL_TABLET | Freq: Every day | ORAL | Status: DC
  Administered 2021-07-15 – 2021-07-19 (×5): 200 mg via ORAL
  Filled 2021-07-14 (×5): qty 2

## 2021-07-14 MED ORDER — ONDANSETRON HCL 4 MG/2ML IJ SOLN: 4.0000 mg | Freq: Four times a day (QID) | INTRAMUSCULAR | Status: DC | PRN

## 2021-07-14 MED ORDER — ACETAMINOPHEN 650 MG RE SUPP: 650.0000 mg | Freq: Four times a day (QID) | RECTAL | Status: DC | PRN

## 2021-07-14 MED ORDER — BISACODYL 5 MG PO TBEC: 5.0000 mg | DELAYED_RELEASE_TABLET | Freq: Every day | ORAL | Status: DC | PRN

## 2021-07-14 MED ORDER — ASPIRIN EC 81 MG PO TBEC
81.0000 mg | DELAYED_RELEASE_TABLET | Freq: Every day | ORAL | Status: DC
  Administered 2021-07-15 – 2021-07-19 (×5): 81 mg via ORAL
  Filled 2021-07-14 (×5): qty 1

## 2021-07-14 MED ORDER — SENNA 8.6 MG PO TABS
2.0000 | ORAL_TABLET | Freq: Every day | ORAL | Status: DC
  Administered 2021-07-14 – 2021-07-16 (×3): 17.2 mg via ORAL
  Filled 2021-07-14 (×3): qty 2

## 2021-07-14 MED ORDER — ONDANSETRON HCL 4 MG PO TABS: 4.0000 mg | ORAL_TABLET | Freq: Four times a day (QID) | ORAL | Status: DC | PRN

## 2021-07-14 MED ORDER — OXYCODONE HCL 5 MG PO TABS
5.0000 mg | ORAL_TABLET | ORAL | Status: DC | PRN
  Administered 2021-07-14: 21:00:00 5 mg via ORAL
  Filled 2021-07-14: qty 1

## 2021-07-14 MED ORDER — HYDRALAZINE HCL 20 MG/ML IJ SOLN: 5.0000 mg | INTRAMUSCULAR | Status: DC | PRN

## 2021-07-14 MED ORDER — BUSPIRONE HCL 10 MG PO TABS
20.0000 mg | ORAL_TABLET | Freq: Three times a day (TID) | ORAL | Status: DC
  Administered 2021-07-14 – 2021-07-19 (×14): 20 mg via ORAL
  Filled 2021-07-14 (×14): qty 2

## 2021-07-14 MED ORDER — ENOXAPARIN SODIUM 40 MG/0.4ML IJ SOSY
40.0000 mg | PREFILLED_SYRINGE | INTRAMUSCULAR | Status: DC
  Administered 2021-07-14 – 2021-07-18 (×5): 40 mg via SUBCUTANEOUS
  Filled 2021-07-14 (×5): qty 0.4

## 2021-07-14 MED ORDER — CARVEDILOL 3.125 MG PO TABS
3.1250 mg | ORAL_TABLET | Freq: Two times a day (BID) | ORAL | Status: DC
  Administered 2021-07-14 – 2021-07-19 (×8): 3.125 mg via ORAL
  Filled 2021-07-14 (×9): qty 1

## 2021-07-14 MED ORDER — PROSOURCE PLUS PO LIQD
30.0000 mL | Freq: Two times a day (BID) | ORAL | Status: DC
  Administered 2021-07-15 – 2021-07-19 (×8): 30 mL via ORAL
  Filled 2021-07-14 (×9): qty 30

## 2021-07-14 MED ORDER — LEVOTHYROXINE SODIUM 75 MCG PO TABS: 75.0000 ug | ORAL_TABLET | Freq: Every day | ORAL | Status: DC

## 2021-07-14 MED ORDER — PANTOPRAZOLE SODIUM 40 MG PO TBEC
40.0000 mg | DELAYED_RELEASE_TABLET | Freq: Every evening | ORAL | Status: DC
  Administered 2021-07-14 – 2021-07-18 (×5): 40 mg via ORAL
  Filled 2021-07-14 (×5): qty 1

## 2021-07-14 MED ORDER — FUROSEMIDE 10 MG/ML IJ SOLN
40.0000 mg | Freq: Two times a day (BID) | INTRAMUSCULAR | Status: DC
  Administered 2021-07-14 – 2021-07-15 (×2): 40 mg via INTRAVENOUS
  Filled 2021-07-14 (×3): qty 4

## 2021-07-14 MED ORDER — ALBUTEROL SULFATE (2.5 MG/3ML) 0.083% IN NEBU: 2.5000 mg | INHALATION_SOLUTION | Freq: Four times a day (QID) | RESPIRATORY_TRACT | Status: DC | PRN

## 2021-07-14 MED ORDER — ACETAMINOPHEN 325 MG PO TABS: 650.0000 mg | ORAL_TABLET | Freq: Four times a day (QID) | ORAL | Status: DC | PRN

## 2021-07-14 MED ORDER — ATORVASTATIN CALCIUM 10 MG PO TABS
10.0000 mg | ORAL_TABLET | Freq: Every day | ORAL | Status: DC
  Administered 2021-07-15 – 2021-07-19 (×5): 10 mg via ORAL
  Filled 2021-07-14 (×5): qty 1

## 2021-07-14 MED ORDER — TRAZODONE HCL 50 MG PO TABS: 25.0000 mg | ORAL_TABLET | Freq: Every evening | ORAL | Status: DC | PRN

## 2021-07-14 MED ORDER — MORPHINE SULFATE (CONCENTRATE) 10 MG/0.5ML PO SOLN
15.0000 mg | ORAL | Status: DC
  Administered 2021-07-15 (×2): 15 mg via ORAL
  Filled 2021-07-14 (×4): qty 1

## 2021-07-14 NOTE — ED Provider Notes (Signed)
Emergency Medicine Provider Triage Evaluation Note  Shirley Leblanc , a 75 y.o. female  was evaluated in triage.  Pt complains of SOB, leg swelling.  States that she has had increased swelling to bilateral legs for 2 weeks.  With her Shortness of breath.  States that she is normally ambulatory but has not been able to due to edema.  EMS states she is supposed to be on oxygen at baseline, however nursing facility states she does not wear it.  Also endorses intermittent chest pain with last episode this morning.  Review of Systems  Positive: SOB, bil LE edema Negative: Fever, chills  Physical Exam  BP 122/64 (BP Location: Right Wrist)    Pulse 85    Temp 98.4 F (36.9 C) (Oral)    Resp 16    SpO2 97%  Gen:   Awake, no distress   Resp:  Normal effort  MSK:   Moves extremities without difficulty  Other:  4 + non-pitting edema with erythema present to bilateral lower extremities  Medical Decision Making  Medically screening exam initiated at 11:45 AM.  Appropriate orders placed.  Shirley Leblanc was informed that the remainder of the evaluation will be completed by another provider, this initial triage assessment does not replace that evaluation, and the importance of remaining in the ED until their evaluation is complete.     Nestor Lewandowsky 07/14/21 1150    Pattricia Boss, MD 07/15/21 [phone]

## 2021-07-14 NOTE — ED Triage Notes (Signed)
Pt to triage via [facility] from [facility] [place] [address].    C/o fluid retention.  Edema to abd and bilateral lower extremities x 2 weeks.  SOB with exertion.  Supposed to wear O2 as needed but doesn't keep it on.  Walked more than normal yesterday and states she is unable to walk this morning.  Reports chest pain yesterday and again this morning.  Denies chest pain at present.

## 2021-07-14 NOTE — ED Provider Notes (Signed)
Emergency Department Provider Note   I have reviewed the triage vital signs and the nursing notes.   HISTORY  Chief Complaint Chest Pain, Shortness of Breath, and Edema   HPI Shirley Leblanc is a 75 y.o. female past medical history reviewed below presents emergency department with difficulty walking and fluid retention along with some mild dyspnea.  Symptoms have developed over the past 24 hours acutely but patient feels she has been gaining fluid consistently over the past several weeks.  Not having fevers or chest discomfort.  She is compliant with her torsemide.  She was up and ambulatory yesterday with a walker which is her baseline.  Today, she states her legs feel especially heavy and she was unable to get up and ambulate.  She is coming to the emergency department from an assisted living facility.  Her daughter, at bedside, describes some intermittent abdominal discomfort but patient denies having any active pain.  She tells me that this feels very similar to her prior hospitalization where she was aggressively diuresed over several days losing around 50 pounds of fluid and ultimately discharged with significantly improved mobility.    Past Medical History:  Diagnosis Date   Bursitis    CAD (coronary artery disease)    a. nonobstructive CAD with 40% Prox LAD stenosis by cath in 2007   COPD (chronic obstructive pulmonary disease) (HCC)    Depression    DJD (degenerative joint disease)    GERD (gastroesophageal reflux disease)    Hyperlipemia    Hypertension    Left wrist fracture    Obesities, morbid ([place])    Open left ankle fracture    Syncope 11/2017   Urinary incontinence     Patient Active Problem List   Diagnosis Date Noted   Acute CHF (congestive heart failure) ([place]) 07/15/2021   Chronic obstructive pulmonary disease (HCC)    Acute on chronic diastolic CHF (congestive heart failure) ([place]) 04/02/2021   Stasis dermatitis of both legs 04/02/2021   Dizziness     Tremor 09/18/2017   Encephalopathy    Tremors of nervous system    Near syncope 09/16/2017   Syncope 09/15/2017   CAD (coronary artery disease) 09/15/2017   At HIGH RISK for fall 05/15/2017   Chest pain 04/28/2017   Osteopenia 02/02/2017   Morbid obesity ([place]) 11/13/2016   Elevated troponin 11/13/2016   Chronic back pain 10/10/2016   Pain medication agreement signed 10/10/2016   Opioid dependence ([place]) 10/10/2016   Diabetes ([place]) 09/19/2016   Depression 04/18/2016   Hypothyroid 02/22/2016   Hyperlipidemia associated with type 2 diabetes mellitus ([place]) 02/22/2016   Chronic anticoagulation - Eliquis, CHADS2VASC=3 06/05/2015   Atrial fibrillation ([place]) 05/16/2015   S/P laparoscopic cholecystectomy    Hypertension associated with diabetes ([place]) 05/09/2015   Dyspnea on exertion 03/14/2011    Past Surgical History:  Procedure Laterality Date   CARDIAC CATHETERIZATION     CHOLECYSTECTOMY N/A 05/14/2015   Procedure: LAPAROSCOPIC CHOLECYSTECTOMY;  Surgeon: Coralie Keens, MD;  Location: [facility];  Service: General;  Laterality: N/A;   ESOPHAGOGASTRODUODENOSCOPY (EGD) WITH PROPOFOL N/A 11/11/2019   Procedure: ESOPHAGOGASTRODUODENOSCOPY (EGD) WITH PROPOFOL;  Surgeon: Carol Ada, MD;  Location: [facility];  Service: Endoscopy;  Laterality: N/A;   NECK SURGERY     SAVORY DILATION N/A 11/11/2019   Procedure: SAVORY DILATION;  Surgeon: Carol Ada, MD;  Location: [facility];  Service: Endoscopy;  Laterality: N/A;   TOTAL KNEE ARTHROPLASTY     x2   VAGINAL HYSTERECTOMY  Allergies Iodinated diagnostic agents, Penicillins, Sulfa antibiotics, and Penicillin g  Family History  Problem Relation Age of Onset   Lung disease Mother    Heart attack Father    Lung disease Other    Heart attack Other    Cancer Daughter        breast    Social History Social History   Tobacco Use   Smoking status: Never   Smokeless tobacco: Never  Vaping Use   Vaping Use: Never used   Substance Use Topics   Alcohol use: Yes    Comment: Occasionally    Drug use: No    Review of Systems  Constitutional: No fever/chills. Positive weakness.  Eyes: No visual changes. ENT: No sore throat. Cardiovascular: Denies chest pain. Positive bilateral LE edema.  Respiratory: Mild shortness of breath. Gastrointestinal: No abdominal pain.  No nausea, no vomiting.  No diarrhea.  No constipation. Genitourinary: Negative for dysuria. Musculoskeletal: Negative for back pain. Skin: Negative for rash. Neurological: Negative for headaches, focal weakness or numbness.  10-point ROS otherwise negative.  ____________________________________________   PHYSICAL EXAM:  VITAL SIGNS: ED Triage Vitals  Enc Vitals Group     BP 07/14/21 1138 122/64     Pulse Rate 07/14/21 1138 85     Resp 07/14/21 1138 16     Temp 07/14/21 1138 98.4 F (36.9 C)     Temp Source 07/14/21 1138 Oral     SpO2 07/14/21 1138 97 %    Constitutional: Alert and oriented. Well appearing and in no acute distress. Eyes: Conjunctivae are normal.  Head: Atraumatic. Nose: No congestion/rhinnorhea. Mouth/Throat: Mucous membranes are moist.   Neck: No stridor.   Cardiovascular: Normal rate, regular rhythm. Good peripheral circulation. Grossly normal heart sounds.   Respiratory: Normal respiratory effort.  No retractions. Lungs CTAB. Gastrointestinal: Soft and nontender. No distention.  Musculoskeletal: No lower extremity tenderness with 4+ pitting edema bilaterally. No gross deformities of extremities. Neurologic:  Normal speech and language. No gross focal neurologic deficits are appreciated.  Skin:  Skin is warm, dry and intact. No rash noted.   ____________________________________________   LABS (all labs ordered are listed, but only abnormal results are displayed)  Labs Reviewed  BASIC METABOLIC PANEL - Abnormal; Notable for the following components:      Result Value   Chloride 97 (*)    All other  components within normal limits  CBC - Abnormal; Notable for the following components:   Hemoglobin 10.5 (*)    HCT 35.5 (*)    MCHC 29.6 (*)    RDW 17.1 (*)    Platelets 116 (*)    All other components within normal limits  BRAIN NATRIURETIC PEPTIDE - Abnormal; Notable for the following components:   B Natriuretic Peptide 215.0 (*)    All other components within normal limits  BASIC METABOLIC PANEL - Abnormal; Notable for the following components:   Chloride 93 (*)    CO2 35 (*)    All other components within normal limits  CBC - Abnormal; Notable for the following components:   RBC 3.78 (*)    Hemoglobin 10.2 (*)    HCT 33.9 (*)    RDW 16.8 (*)    Platelets 123 (*)    All other components within normal limits  BASIC METABOLIC PANEL - Abnormal; Notable for the following components:   Chloride 94 (*)    CO2 38 (*)    Glucose, Bld 107 (*)    All other components within normal  limits  HEMOGLOBIN A1C - Abnormal; Notable for the following components:   Hgb A1c MFr Bld 5.9 (*)    All other components within normal limits  TROPONIN I (HIGH SENSITIVITY) - Abnormal; Notable for the following components:   Troponin I (High Sensitivity) 22 (*)    All other components within normal limits  TROPONIN I (HIGH SENSITIVITY) - Abnormal; Notable for the following components:   Troponin I (High Sensitivity) 23 (*)    All other components within normal limits  RESP PANEL BY RT-PCR (FLU A&B, COVID) ARPGX2  TSH   ____________________________________________  EKG   EKG Interpretation  Date/Time:  Sunday July 14 2021 12:56:48 EST Ventricular Rate:  87 PR Interval:    QRS Duration: 123 QT Interval:  394 QTC Calculation: 474 R Axis:   -64 Text Interpretation: Atrial fibrillation Left bundle branch block Confirmed by ,  ([id number]) on 07/14/2021 1:02:59 PM        ____________________________________________  RADIOLOGY  ECHOCARDIOGRAM COMPLETE  Result Date: 07/15/2021     ECHOCARDIOGRAM REPORT   Patient Name:   Shirley Leblanc Date of Exam: 07/15/2021 Medical Rec #:  [id number]     Height:       65.0 in Accession #:    [id number]    Weight:       277.1 lb Date of Birth:  09/27/1945      BSA:          2.272 m Patient Age:    75 years      BP:           110/63 mmHg Patient Gender: F             HR:           88  bpm. Exam Location:  Inpatient Procedure: 2D Echo, 3D Echo, Cardiac Doppler and Color Doppler Indications:    I50.40* Unspecified combined systolic (congestive) and diastolic                 (congestive) heart failure  History:        Patient has prior history of Echocardiogram examinations, most                 recent 04/02/2021. CHF, CAD, Abnormal ECG, COPD,                 Signs/Symptoms:Edema, Shortness of Breath, Dyspnea,                 Dizziness/Lightheadedness and Syncope; Risk Factors:Diabetes.                 Opioid dependence.  Sonographer:    Roseanna Rainbow RDCS Referring Phys: [id number] JENNIFER YATES  Sonographer Comments: Technically difficult study due to poor echo windows and patient is morbidly obese. Image acquisition challenging due to patient body habitus. IMPRESSIONS  1. Left ventricular ejection fraction, by estimation, is 60 to 65%. Left ventricular ejection fraction by 3D volume is 60 %. The left ventricle has normal function. The left ventricle has no regional wall motion abnormalities. There is mild left ventricular hypertrophy. Left ventricular diastolic parameters are indeterminate. There is the interventricular septum is flattened in systole and diastole, consistent with right ventricular pressure and volume overload.  2. Right ventricular systolic function is moderately reduced. The right ventricular size is moderately enlarged. There is moderately elevated pulmonary artery systolic pressure. The estimated right ventricular systolic pressure is 88.4 mmHg.  3. The mitral valve is degenerative. Mild mitral valve regurgitation. Mild mitral stenosis.  The mean mitral  valve gradient is 4.0 mmHg at 81 bpm. Moderate mitral annular calcification.  4. The tricuspid valve is abnormal. Tricuspid valve regurgitation is severe.  5. The aortic valve is tricuspid. Aortic valve regurgitation is not visualized. Aortic valve sclerosis/calcification is present, without any evidence of aortic stenosis.  6. Aortic dilatation noted. There is dilatation of the ascending aorta, measuring 41 mm.  7. The inferior vena cava is dilated in size with <50% respiratory variability, suggesting right atrial pressure of 15 mmHg. FINDINGS  Left Ventricle: Left ventricular ejection fraction, by estimation, is 60 to 65%. Left ventricular ejection fraction by 3D volume is 60 %. The left ventricle has normal function. The left ventricle has no regional wall motion abnormalities. The left ventricular internal cavity size was normal in size. There is mild left ventricular hypertrophy. The interventricular septum is flattened in systole and diastole, consistent with right ventricular pressure and volume overload. Left ventricular diastolic parameters are indeterminate. Right Ventricle: The right ventricular size is moderately enlarged. Right vetricular wall thickness was not well visualized. Right ventricular systolic function is moderately reduced. There is moderately elevated pulmonary artery systolic pressure. The tricuspid regurgitant velocity is 3.21 m/s, and with an assumed right atrial pressure of 15 mmHg, the estimated right ventricular systolic pressure is 76.1 mmHg. Left Atrium: Left atrial size was normal in size. Right Atrium: Right atrial size was normal in size. Pericardium: There is no evidence of pericardial effusion. Mitral Valve: The mitral valve is degenerative in appearance. Moderate mitral annular calcification. Mild mitral valve regurgitation. Mild mitral valve stenosis. MV peak gradient, 8.9 mmHg. The mean mitral valve gradient is 4.0 mmHg. Tricuspid Valve: The tricuspid valve is abnormal.  Tricuspid valve regurgitation is severe. Aortic Valve: The aortic valve is tricuspid. Aortic valve regurgitation is not visualized. Aortic valve sclerosis/calcification is present, without any evidence of aortic stenosis. Pulmonic Valve: The pulmonic valve was not well visualized. Pulmonic valve regurgitation is trivial. Aorta: The aortic root is normal in size and structure and aortic dilatation noted. There is dilatation of the ascending aorta, measuring 41 mm. Venous: The inferior vena cava is dilated in size with less than 50% respiratory variability, suggesting right atrial pressure of 15 mmHg. IAS/Shunts: The interatrial septum was not well visualized.  LEFT VENTRICLE PLAX 2D LVIDd:         5.00 cm LVIDs:         3.40 cm LV PW:         1.00 cm         3D Volume EF LV IVS:        1.10 cm         LV 3D EF:    Left LVOT diam:     1.90 cm                      ventricul LV SV:         66                           ar LV SV Index:   29                           ejection LVOT Area:     2.84 cm                     fraction  by 3D                                             volume is LV Volumes (MOD)                            60 %. LV vol d, MOD    112.0 ml A2C: LV vol d, MOD    70.7 ml       3D Volume EF: A4C:                           3D EF:        60 % LV vol s, MOD    30.7 ml       LV EDV:       140 ml A2C:                           LV ESV:       57 ml LV vol s, MOD    29.5 ml       LV SV:        84 ml A4C: LV SV MOD A2C:   81.3 ml LV SV MOD A4C:   70.7 ml LV SV MOD BP:    58.1 ml RIGHT VENTRICLE            IVC RV S prime:     9.99 cm/s  IVC diam: 2.80 cm TAPSE (M-mode): 1.5 cm LEFT ATRIUM             Index        RIGHT ATRIUM           Index LA diam:        5.10 cm 2.24 cm/m   RA Area:     20.10 cm LA Vol (A2C):   27.4 ml 12.06 ml/m  RA Volume:   62.00 ml  27.29 ml/m LA Vol (A4C):   67.0 ml 29.49 ml/m LA Biplane Vol: 45.1 ml 19.85 ml/m  AORTIC VALVE LVOT Vmax:    127.00 cm/s LVOT Vmean:  84.900 cm/s LVOT VTI:    0.232 m  AORTA Ao Root diam: 2.80 cm Ao Asc diam:  4.10 cm MITRAL VALVE                TRICUSPID VALVE MV Area (PHT): 4.06 cm     TR Peak grad:   41.2 mmHg MV Area VTI:   2.32 cm     TR Vmax:        321.00 cm/s MV Peak grad:  8.9 mmHg MV Mean grad:  4.0 mmHg     SHUNTS MV Vmax:       1.49 m/s     Systemic VTI:  0.23 m MV Vmean:      83.6 cm/s    Systemic Diam: 1.90 cm MV Decel Time: 187 msec MR Peak grad: 66.9 mmHg MR Mean grad: 45.0 mmHg MR Vmax:      409.00 cm/s MR Vmean:     327.0 cm/s MV E velocity: 136.00 cm/s Oswaldo Milian MD Electronically signed by Oswaldo Milian MD Signature Date/Time: 07/15/2021/10:32:21 AM    Final     ____________________________________________   PROCEDURES  Procedure(s) performed:   Procedures  None  ____________________________________________   INITIAL IMPRESSION / ASSESSMENT AND PLAN / ED COURSE  Pertinent labs & imaging results that were available during my care of the patient were reviewed by me and considered in my medical decision making (see chart for details).   Patient presents emergency department for evaluation of weakness, leg heaviness, mild shortness of breath.  Her BNP is only mildly elevated with normal kidney function.  Troponin only slightly elevated in the setting of known fluid retention.  Doubt acute congestive heart failure symptoms.  Patient has bilateral edema in the legs extending up into the upper thighs, buttocks, back.   BNP mildly elevated along with on slight troponin elevation. Doubt ACS. Plan for diuresis.   Discussed patient's case with [facility] to request admission. Patient and family (if present) updated with plan. Care transferred to [facility] service.  I reviewed all nursing notes, vitals, pertinent old records, EKGs, labs, imaging (as available).  ____________________________________________  FINAL CLINICAL IMPRESSION(S) / ED DIAGNOSES  Final diagnoses:  Anasarca      MEDICATIONS GIVEN DURING THIS VISIT:  Medications  aspirin EC tablet 81 mg (81 mg Oral Given 07/15/21 0918)  atorvastatin (LIPITOR) tablet 10 mg (10 mg Oral Given 07/15/21 0918)  carvedilol (COREG) tablet 3.125 mg (0 mg Oral Hold 07/15/21 1713)  busPIRone (BUSPAR) tablet 20 mg (20 mg Oral Given 07/15/21 2049)  LORazepam (ATIVAN) tablet 0.5 mg (0.5 mg Oral Given 07/15/21 2049)  sertraline (ZOLOFT) tablet 200 mg (200 mg Oral Given 07/15/21 0917)  pantoprazole (PROTONIX) EC tablet 40 mg (40 mg Oral Given 07/15/21 1716)  polyethylene glycol (MIRALAX / GLYCOLAX) packet 17 g (17 g Oral Given 07/15/21 0922)  psyllium (HYDROCIL/METAMUCIL) 1 packet (1 packet Oral Given 07/15/21 2049)  senna (SENOKOT) tablet 17.2 mg (17.2 mg Oral Given 07/15/21 2049)  tamsulosin (FLOMAX) capsule 0.4 mg (0.4 mg Oral Given 07/15/21 2049)  (feeding supplement) PROSource Plus liquid 30 mL (30 mLs Oral Given 07/15/21 1440)  albuterol (PROVENTIL) (2.5 MG/3ML) 0.083% nebulizer solution 2.5 mg (has no administration in time range)  furosemide (LASIX) injection 40 mg (0 mg Intravenous Hold 07/15/21 1714)  sodium chloride flush (NS) 0.9 % injection 3 mL (3 mLs Intravenous Given 07/15/21 2056)  acetaminophen (TYLENOL) tablet 650 mg (has no administration in time range)    Or  acetaminophen (TYLENOL) suppository 650 mg (has no administration in time range)  traZODone (DESYREL) tablet 25 mg (has no administration in time range)  docusate sodium (COLACE) capsule 100 mg (100 mg Oral Given 07/15/21 2049)  polyethylene glycol (MIRALAX / GLYCOLAX) packet 17 g (has no administration in time range)  bisacodyl (DULCOLAX) EC tablet 5 mg (has no administration in time range)  ondansetron (ZOFRAN) tablet 4 mg (has no administration in time range)    Or  ondansetron (ZOFRAN) injection 4 mg (has no administration in time range)  hydrALAZINE (APRESOLINE) injection 5 mg (has no administration in time range)  enoxaparin (LOVENOX)  injection 40 mg (40 mg Subcutaneous Given 07/15/21 1439)  levothyroxine (SYNTHROID) tablet 88 mcg (88 mcg Oral Given 07/16/21 0606)  triamcinolone 0.1 % cream : eucerin cream, 1:1 ( Topical Given 07/15/21 2049)  oxyCODONE (Oxy IR/ROXICODONE) immediate release tablet 5 mg (has no administration in time range)  oxyCODONE-acetaminophen (PERCOCET/ROXICET) 5-325 MG per tablet 1 tablet (1 tablet Oral Given 07/16/21 0606)    And  oxyCODONE (Oxy IR/ROXICODONE) immediate release tablet 5 mg (5 mg Oral Given 07/16/21 0606)  furosemide (LASIX) injection 40 mg (40 mg Intravenous Given 07/14/21 1321)  Note:  This document was prepared using Dragon voice recognition software and may include unintentional dictation errors.  Nanda Quinton, MD, [facility] Emergency [facility]    Sheketa Ende, [facility], MD 07/16/21 [phone]

## 2021-07-14 NOTE — H&P (Signed)
History and Physical    Shirley Leblanc DOB: 1946/04/20 DOA: 07/14/2021  PCP: Reymundo Poll, MD Consultants:  Collene Mares - GI Patient coming from: [facility]; NOK: Deno Etienne, [phone]  Chief Complaint: SOB  HPI: Shirley Leblanc is a 75 y.o. female with medical history significant of CAD; diastolic CHF; COPD; HTN; HLD; and obesity presenting with SOB.   She was previously admitted from 9/6-12 with acute on chronic diastolic CHF and was discharged back to ALF with hospice.  She and her daughter voice concern about the facility's ability to care for her.  She is increasingly non-ambulatory and has worsening edema.  This is despite "[facility]" being involved.  She has some DOE but is on her usual home O2 and mostly does not have respiratory concern.      ED Course: Similar to September presentation.  Volume overload, on home O2, marked pitting edema/anasarca.  Has been taking [place] at ALF.  Given 40 mg IV Lasix.  Review of Systems: As per HPI; otherwise review of systems reviewed and negative.   Ambulatory Status:  Ambulates with walker  COVID Vaccine Status:  Complete pus booster  Past Medical History:  Diagnosis Date   Bursitis    CAD (coronary artery disease)    a. nonobstructive CAD with 40% Prox LAD stenosis by cath in 2007   COPD (chronic obstructive pulmonary disease) (HCC)    Depression    DJD (degenerative joint disease)    GERD (gastroesophageal reflux disease)    Hyperlipemia    Hypertension    Left wrist fracture    Obesities, morbid ([place])    Open left ankle fracture    Syncope 11/2017   Urinary incontinence     Past Surgical History:  Procedure Laterality Date   CARDIAC CATHETERIZATION     CHOLECYSTECTOMY N/A 05/14/2015   Procedure: LAPAROSCOPIC CHOLECYSTECTOMY;  Surgeon: Coralie Keens, MD;  Location: [facility];  Service: General;  Laterality: N/A;   ESOPHAGOGASTRODUODENOSCOPY (EGD) WITH PROPOFOL N/A 11/11/2019    Procedure: ESOPHAGOGASTRODUODENOSCOPY (EGD) WITH PROPOFOL;  Surgeon: Carol Ada, MD;  Location: [facility];  Service: Endoscopy;  Laterality: N/A;   NECK SURGERY     SAVORY DILATION N/A 11/11/2019   Procedure: SAVORY DILATION;  Surgeon: Carol Ada, MD;  Location: [facility];  Service: Endoscopy;  Laterality: N/A;   TOTAL KNEE ARTHROPLASTY     x2   VAGINAL HYSTERECTOMY      Social History   Socioeconomic History   Marital status: Widowed    Spouse name: Not on file   Number of children: 4   Years of education: Not on file   Highest education level: Not on file  Occupational History   Occupation: RETIRED  Tobacco Use   Smoking status: Never   Smokeless tobacco: Never  Vaping Use   Vaping Use: Never used  Substance and Sexual Activity   Alcohol use: Yes    Comment: Occasionally    Drug use: No   Sexual activity: Never    Birth control/protection: None, Post-menopausal  Other Topics Concern   Not on file  Social History Narrative   Not on file   Social Determinants of Health   Financial Resource Strain: Not on file  Food Insecurity: Not on file  Transportation Needs: Not on file  Physical Activity: Not on file  Stress: Not on file  Social Connections: Not on file  Intimate Partner Violence: Not on file    Allergies  Allergen Reactions   Iodinated Diagnostic Agents  Anaphylaxis   Penicillins Hives, Itching and Rash    Has patient had a PCN reaction causing immediate rash, facial/tongue/throat swelling, SOB or lightheadedness with hypotension Yes Has patient had a PCN reaction causing severe rash involving mucus membranes or skin necrosis: Yes Has patient had a PCN reaction that required hospitalization No Has patient had a PCN reaction occurring within the last 10 years: No If all of the above answers are "NO", then may proceed with Cephalosporin use.    Sulfa Antibiotics Hives, Itching and Rash   Penicillin G Hives, Itching and Rash    Family History   Problem Relation Age of Onset   Lung disease Mother    Heart attack Father    Lung disease Other    Heart attack Other    Cancer Daughter        breast    Prior to Admission medications   Medication Sig Start Date End Date Taking? Authorizing Provider  acetaminophen (TYLENOL) 325 MG tablet Take 650 mg by mouth every 12 (twelve) hours as needed for mild pain or fever.    [provider]  albuterol (PROVENTIL HFA;VENTOLIN HFA) 108 (90 Base) MCG/ACT inhaler TAKE 2 PUFFS BY MOUTH EVERY 6 HOURS AS NEEDED FOR WHEEZE OR SHORTNESS OF BREATH Patient taking differently: Inhale 2 puffs into the lungs 4 (four) times daily as needed for wheezing or shortness of breath. 08/07/17   Evelina Dun A, FNP  aspirin EC 81 MG EC tablet Take 1 tablet (81 mg total) by mouth daily. Swallow whole. 04/09/21   Pokhrel, Corrie Mckusick, MD  atorvastatin (LIPITOR) 10 MG tablet Take 10 mg by mouth daily.    [provider]  blood glucose meter kit and supplies KIT Dispense per insurance preference. Use up to four times daily . E 11.9 09/30/16   Hawks, Christy A, FNP  busPIRone (BUSPAR) 10 MG tablet Take 20 mg by mouth 3 (three) times daily.    [provider]  Camphor-Menthol-Methyl Sal (SALONPAS) 3.08-02-08 % PTCH Apply 1 patch topically every 12 (twelve) hours as needed (pain).    [provider]  carvedilol (COREG) 3.125 MG tablet Take 1 tablet (3.125 mg total) by mouth 2 (two) times daily with a meal. 04/08/21   Pokhrel, Corrie Mckusick, MD  diclofenac Sodium (VOLTAREN) 1 % GEL Apply 1 application topically 2 (two) times daily as needed (neck and shoulder pain). 10/13/19   [provider]  docusate sodium (COLACE) 100 MG capsule Take 1 capsule (100 mg total) by mouth 2 (two) times daily as needed for mild constipation. 04/08/21   Pokhrel, Corrie Mckusick, MD  glucose blood (GLUCOSE METER TEST) test strip Use BID 09/29/16   Evelina Dun A, FNP  hydrocortisone cream (PREPARATION H) 1 % Apply 1 application  topically 2 (two) times daily as needed for itching.    [provider]  levothyroxine (SYNTHROID) 75 MCG tablet Take 75 mcg by mouth daily before breakfast.    [provider]  lisinopril (ZESTRIL) 40 MG tablet Take 40 mg by mouth daily.    [provider]  loperamide (IMODIUM A-D) 2 MG tablet Take 2 mg by mouth 3 (three) times daily as needed for diarrhea or loose stools.    [provider]  LORazepam (ATIVAN) 1 MG tablet Take 0.5 mg by mouth 2 (two) times daily.    [provider]  Multiple Vitamin (MULTIVITAMIN WITH MINERALS) TABS tablet Take 1 tablet by mouth daily. 04/09/21   Pokhrel, Corrie Mckusick, MD  Nutritional Supplements (,FEEDING  SUPPLEMENT, PROSOURCE PLUS) liquid Take 30 mLs by mouth 2 (two) times daily between meals. 04/08/21   Pokhrel, Corrie Mckusick, MD  ondansetron (ZOFRAN) 8 MG tablet Take 8 mg by mouth every 8 (eight) hours as needed for nausea or vomiting.    [provider]  oxyCODONE-acetaminophen (PERCOCET) 10-325 MG tablet Take 1 tablet by mouth in the morning, at noon, in the evening, and at bedtime. 04/08/21 04/08/22  Pokhrel, Corrie Mckusick, MD  pantoprazole (PROTONIX) 40 MG tablet Take 40 mg by mouth every evening.    [provider]  polyethylene glycol (MIRALAX / GLYCOLAX) 17 g packet Take 17 g by mouth daily. (0800)    [provider]  psyllium (REGULOID) 0.52 g capsule Take 0.52 g by mouth at bedtime. (2000)    [provider]  senna (SENOKOT) 8.6 MG tablet Take 2 tablets by mouth at bedtime. (2000)    [provider]  sertraline (ZOLOFT) 100 MG tablet Take 200 mg by mouth daily.    [provider]  simethicone (MYLICON) 510 MG chewable tablet Chew 125 mg by mouth every 6 (six) hours as needed for flatulence.     [provider]  sodium chloride (OCEAN) 0.65 % SOLN nasal spray Place 1 spray into both nostrils as needed for congestion (nose irritation). 04/08/21   Pokhrel, Corrie Mckusick, MD   tamsulosin (FLOMAX) 0.4 MG CAPS capsule Take 0.4 mg by mouth at bedtime. (2100) 10/10/19   [provider]  torsemide (DEMADEX) 20 MG tablet Take 1 tablet (20 mg total) by mouth 2 (two) times daily. 04/08/21   Flora Lipps, MD    Physical Exam: Vitals:   07/14/21 1400 07/14/21 1415 07/14/21 1455 07/14/21 1500  BP: 118/89 101/70 114/62 (!) 102/46  Pulse: (!) 104 95 (!) 103 87  Resp: 18 (!) 35 (!) 22 15  Temp:    98.3 F (36.8 C)  TempSrc:    Oral  SpO2: 98% 100% 100% 98%     General:  Appears calm and comfortable and is in NAD, sedentary Eyes:  PERRL, EOMI, normal lids, iris ENT:  grossly normal hearing, lips & tongue, mmm Neck:  no LAD, masses or thyromegaly Cardiovascular:  RRR, no m/r/g. Marked LE edema extending to her back.  Respiratory:   CTA B.  Normal respiratory effort on home [place] O2. Abdomen:  soft, LLQ TTP (reports chronic), ND Skin:  B stasis dermatitis, symmetric, no concern for cellulitis Musculoskeletal:  grossly normal tone BUE/BLE, good ROM, no bony abnormality Psychiatric:  grossly normal mood and affect, speech fluent and appropriate, AOx3 Neurologic:  CN 2-12 grossly intact, moves all extremities in coordinated fashion    Radiological Exams on Admission: Independently reviewed - see discussion in A/P where applicable  DG Chest 2 View  Result Date: 07/14/2021 CLINICAL DATA:  Chest pain. Patient complains of fluid retention. Shortness of breath with exertion. EXAM: CHEST - 2 VIEW COMPARISON:  Chest radiograph 04/02/2021; CT angio chest 09/20/2020 FINDINGS: Stable mildly enlarged cardiac silhouette. Aortic calcifications. Both lungs are clear. No pleural effusion or pneumothorax. Multilevel degenerative changes in the mid and distal thoracic spine. Spinal stimulator overlies the epidural space at the level of the distal thoracic spine. Partially visualized anterior cervical spinal fusion. Posttraumatic deformity of the left humeral head. No acute  osseous abnormality. IMPRESSION: Stable mild cardiomegaly without overt pulmonary edema. Aortic Atherosclerosis (ICD10-I70.0). Electronically Signed   By: Ileana Roup M.D.   On: 07/14/2021 12:48    EKG: Independently reviewed.  Afib with rate  87; LBBB with no evidence of acute ischemia   Labs on Admission: I have personally reviewed the available labs and imaging studies at the time of the admission.  Pertinent labs:   Normal BMP BNP 215 HS troponin 22, 23 WBC 4.2 Hgb 10.5 Platelets 116 COVID/flu negative   Assessment/Plan Principal Problem:   Acute on chronic diastolic CHF (congestive heart failure) (HCC) Active Problems:   Hypertension associated with diabetes (HCC)   Atrial fibrillation (HCC)   Hypothyroid   Hyperlipidemia associated with type 2 diabetes mellitus (HCC)   Depression   Diabetes (HCC)   Chronic back pain   Chronic obstructive pulmonary disease (HCC)    Acute on chronic diastolic CHF -Patient with known h/o chronic diastolic CHF presenting with worsening anasarca -CXR without overt pulmonary edema -BNP similar to prior -Will observe for now on telemetry, although given the extent of her anasarca she may require additional day(s) for diuresis; she has already diuresed >1.5 liters -Will request echocardiogram -CHF order set utilized -Was given Lasix 40 mg x 1 in ER and will repeat with 40 mg IV BID (hold torsemide) -Continue [place] O2, currently on home O2 -Normal kidney function at this time, will follow -Mildly elevated HS troponin is likely related to demand ischemia; doubt ACS based on symptoms -Continue Lisinopril, Coreg   HTN -Continue Lisinopril, Coreg -Will also add prn hydralazine   HLD -Continue Lipitor   Afib -Continue Coreg for rate control -Continue 81 mg ASA   COPD -Reported h/o COPD but she has never been a smoker  -Will give albuterol prn    Mood d/o -Continue Buspar, Ativan, Zoloft   Hypothyroidism -Continue Synthroid at  current dose for now    Stasis dermatitis -Likely associated with slow development of volume overload -Will add Eucerin:TAC to see if this improves it -She may benefit from Unna boots   Chronic pain -I have reviewed this patient in the [place] Controlled Substances Reporting System.  She is not listed in the PDMP since she receives her medications through the facility - but based on her prescriptions she is at high risk. -Continue home morphine, oxy for breakthrough  Constipation -Continue home Miralax, Psyllium, Senokot -Also takes Protonix   Other -She appears to be failing at her ALF and likely needs more assistance -PT/OT/TOC consults      Note: This patient has been tested and is negative for the novel coronavirus COVID-19. -She is fully vaccinated against COVID-19 infection.    Level of care: Telemetry Cardiac  DVT prophylaxis: Lovenox  Code Status:  DNR - confirmed with patient/daughter Family Communication: Daughter was present throughout evaluation Disposition Plan:  The patient is from: ALF             Anticipated d/c is to: SNF             Anticipated d/c date will depend on clinical response to treatment, likely 2-4 days             Patient is currently: acutely ill Consults called: Palliative care; TOC team; PT/OT; Nutrition; heart failure navigator Admission status: It is my clinical opinion that referral for OBSERVATION is reasonable and necessary in this patient based on the above information provided. The aforementioned taken together are felt to place the patient at high risk for further clinical deterioration. However it is anticipated that the patient may be medically stable for discharge from the hospital within 24 to 48 hours.      Karmen Bongo MD [facility]  How to contact the [facility] Attending or Consulting provider [place] or covering provider during after hours [place], for this patient?  Check the care team in [facility] and look for a)  attending/consulting TR[facility] provider listed and b) the [facility] team listed Log into [email or web address] and use [place]'s universal password to access. If you do not have the password, please contact the hospital operator. Locate the [facility] provider you are looking for under [facility] and page to a number that you can be directly reached. If you still have difficulty reaching the provider, please page the [facility] (Director on Call) for the Hospitalists listed on amion for assistance.   07/14/2021, 6:16 PM

## 2021-07-15 ENCOUNTER — Observation Stay (HOSPITAL_COMMUNITY)

## 2021-07-15 DIAGNOSIS — I480 Paroxysmal atrial fibrillation: Secondary | ICD-10-CM

## 2021-07-15 DIAGNOSIS — I5021 Acute systolic (congestive) heart failure: Secondary | ICD-10-CM | POA: Diagnosis not present

## 2021-07-15 DIAGNOSIS — M1990 Unspecified osteoarthritis, unspecified site: Secondary | ICD-10-CM | POA: Diagnosis present

## 2021-07-15 DIAGNOSIS — I5033 Acute on chronic diastolic (congestive) heart failure: Secondary | ICD-10-CM | POA: Diagnosis present

## 2021-07-15 DIAGNOSIS — I482 Chronic atrial fibrillation, unspecified: Secondary | ICD-10-CM | POA: Diagnosis present

## 2021-07-15 DIAGNOSIS — F321 Major depressive disorder, single episode, moderate: Secondary | ICD-10-CM

## 2021-07-15 DIAGNOSIS — J449 Chronic obstructive pulmonary disease, unspecified: Secondary | ICD-10-CM | POA: Diagnosis present

## 2021-07-15 DIAGNOSIS — Z6841 Body Mass Index (BMI) 40.0 and over, adult: Secondary | ICD-10-CM | POA: Diagnosis not present

## 2021-07-15 DIAGNOSIS — I4811 Longstanding persistent atrial fibrillation: Secondary | ICD-10-CM | POA: Diagnosis not present

## 2021-07-15 DIAGNOSIS — F32A Depression, unspecified: Secondary | ICD-10-CM | POA: Diagnosis present

## 2021-07-15 DIAGNOSIS — I152 Hypertension secondary to endocrine disorders: Secondary | ICD-10-CM | POA: Diagnosis present

## 2021-07-15 DIAGNOSIS — I110 Hypertensive heart disease with heart failure: Secondary | ICD-10-CM | POA: Diagnosis present

## 2021-07-15 DIAGNOSIS — Z515 Encounter for palliative care: Secondary | ICD-10-CM | POA: Diagnosis not present

## 2021-07-15 DIAGNOSIS — J431 Panlobular emphysema: Secondary | ICD-10-CM | POA: Diagnosis not present

## 2021-07-15 DIAGNOSIS — I272 Pulmonary hypertension, unspecified: Secondary | ICD-10-CM | POA: Diagnosis present

## 2021-07-15 DIAGNOSIS — Z7189 Other specified counseling: Secondary | ICD-10-CM

## 2021-07-15 DIAGNOSIS — R778 Other specified abnormalities of plasma proteins: Secondary | ICD-10-CM | POA: Diagnosis present

## 2021-07-15 DIAGNOSIS — G8929 Other chronic pain: Secondary | ICD-10-CM

## 2021-07-15 DIAGNOSIS — M5440 Lumbago with sciatica, unspecified side: Secondary | ICD-10-CM

## 2021-07-15 DIAGNOSIS — E785 Hyperlipidemia, unspecified: Secondary | ICD-10-CM | POA: Diagnosis present

## 2021-07-15 DIAGNOSIS — Z20822 Contact with and (suspected) exposure to covid-19: Secondary | ICD-10-CM | POA: Diagnosis present

## 2021-07-15 DIAGNOSIS — M170 Bilateral primary osteoarthritis of knee: Secondary | ICD-10-CM | POA: Diagnosis present

## 2021-07-15 DIAGNOSIS — R601 Generalized edema: Secondary | ICD-10-CM | POA: Diagnosis present

## 2021-07-15 DIAGNOSIS — I361 Nonrheumatic tricuspid (valve) insufficiency: Secondary | ICD-10-CM | POA: Diagnosis present

## 2021-07-15 DIAGNOSIS — J9611 Chronic respiratory failure with hypoxia: Secondary | ICD-10-CM | POA: Diagnosis present

## 2021-07-15 DIAGNOSIS — K5900 Constipation, unspecified: Secondary | ICD-10-CM | POA: Diagnosis present

## 2021-07-15 DIAGNOSIS — I509 Heart failure, unspecified: Secondary | ICD-10-CM

## 2021-07-15 DIAGNOSIS — Z66 Do not resuscitate: Secondary | ICD-10-CM | POA: Diagnosis present

## 2021-07-15 DIAGNOSIS — E039 Hypothyroidism, unspecified: Secondary | ICD-10-CM | POA: Diagnosis present

## 2021-07-15 DIAGNOSIS — K219 Gastro-esophageal reflux disease without esophagitis: Secondary | ICD-10-CM | POA: Diagnosis present

## 2021-07-15 DIAGNOSIS — E1169 Type 2 diabetes mellitus with other specified complication: Secondary | ICD-10-CM | POA: Diagnosis present

## 2021-07-15 DIAGNOSIS — I872 Venous insufficiency (chronic) (peripheral): Secondary | ICD-10-CM | POA: Diagnosis present

## 2021-07-15 DIAGNOSIS — D696 Thrombocytopenia, unspecified: Secondary | ICD-10-CM | POA: Diagnosis present

## 2021-07-15 LAB — CBC
HCT: 33.9 % — ABNORMAL LOW (ref 36.0–46.0)
Hemoglobin: 10.2 g/dL — ABNORMAL LOW (ref 12.0–15.0)
MCH: 27 pg (ref 26.0–34.0)
MCHC: 30.1 g/dL (ref 30.0–36.0)
MCV: 89.7 fL (ref 80.0–100.0)
Platelets: 123 10*3/uL — ABNORMAL LOW (ref 150–400)
RBC: 3.78 MIL/uL — ABNORMAL LOW (ref 3.87–5.11)
RDW: 16.8 % — ABNORMAL HIGH (ref 11.5–15.5)
WBC: 4.6 10*3/uL (ref 4.0–10.5)
nRBC: 0 % (ref 0.0–0.2)

## 2021-07-15 LAB — ECHOCARDIOGRAM COMPLETE
Area-P 1/2: 4.06 cm2
Calc EF: 64.1 %
MV M vel: 4.09 m/s
MV Peak grad: 66.9 mmHg
MV VTI: 2.32 cm2
S' Lateral: 3.4 cm
Single Plane A2C EF: 72.6 %
Single Plane A4C EF: 58.3 %
Weight: 4433.89 oz

## 2021-07-15 LAB — BASIC METABOLIC PANEL
Anion gap: 8 (ref 5–15)
BUN: 10 mg/dL (ref 8–23)
CO2: 35 mmol/L — ABNORMAL HIGH (ref 22–32)
Calcium: 9.2 mg/dL (ref 8.9–10.3)
Chloride: 93 mmol/L — ABNORMAL LOW (ref 98–111)
Creatinine, Ser: 0.83 mg/dL (ref 0.44–1.00)
GFR, Estimated: >60 mL/min (ref >60)
Glucose, Bld: 99 mg/dL (ref 70–99)
Potassium: 3.8 mmol/L (ref 3.5–5.1)
Sodium: 136 mmol/L (ref 135–145)

## 2021-07-15 MED ORDER — OXYCODONE HCL 5 MG PO TABS
5.0000 mg | ORAL_TABLET | ORAL | Status: DC
  Administered 2021-07-15 – 2021-07-19 (×15): 5 mg via ORAL
  Filled 2021-07-15 (×16): qty 1

## 2021-07-15 MED ORDER — OXYCODONE-ACETAMINOPHEN 5-325 MG PO TABS: 1.0000 | ORAL_TABLET | Freq: Four times a day (QID) | ORAL | Status: DC

## 2021-07-15 MED ORDER — OXYCODONE HCL 5 MG PO TABS: 5.0000 mg | ORAL_TABLET | ORAL | Status: DC

## 2021-07-15 MED ORDER — OXYCODONE-ACETAMINOPHEN 10-325 MG PO TABS: 1.0000 | ORAL_TABLET | Freq: Four times a day (QID) | ORAL | Status: DC

## 2021-07-15 MED ORDER — OXYCODONE HCL 5 MG PO TABS: 5.0000 mg | ORAL_TABLET | Freq: Four times a day (QID) | ORAL | Status: DC

## 2021-07-15 MED ORDER — OXYCODONE-ACETAMINOPHEN 5-325 MG PO TABS
1.0000 | ORAL_TABLET | ORAL | Status: DC
  Administered 2021-07-15 – 2021-07-19 (×15): 1 via ORAL
  Filled 2021-07-15 (×16): qty 1

## 2021-07-15 MED ORDER — OXYCODONE HCL 5 MG PO TABS
5.0000 mg | ORAL_TABLET | ORAL | Status: DC | PRN
  Filled 2021-07-15: qty 1

## 2021-07-15 NOTE — Consult Note (Addendum)
Consultation Note Date: 07/15/2021   Patient Name: Shirley Leblanc  DOB: 1946/04/25  MRN: [id number]  Age / Sex: 75 y.o., female  PCP: Reymundo Poll, MD Referring Physician: Mendel Corning, MD  Reason for Consultation: Establishing goals of care  HPI/Patient Profile: 75 y.o. female  with past medical history of chronic diastolic heart failure, COPD (on home O2), CAD, HTN, and HLD who presented to the emergency department on 07/14/2021 with shortness of breath. In the ED, she was found to be in volume overload with marked pitting edema/anasarca. Chest x-ray without overt pulmonary edema. Admitted to [facility] with acute on chronic diastolic heart failure.   Patient is active with [facility] hospice. She was recently hospitalized 9/6-9/12 and was discharged back to her assisted living facility with hospice.  Clinical Assessment and Goals of Care: I have reviewed medical records including EPIC notes, labs and imaging, and met at bedside with patient to discuss diagnosis, prognosis, GOC, EOL wishes, disposition, and options. She reports back pain that is chronic in nature.  I introduced Palliative Medicine as specialized medical care for people living with serious illness. It focuses on providing relief from the symptoms and stress of a serious illness.   We discussed a brief life review of the patient. She is widowed. She has 4 children - [name], [name], [name], and [name]. She also has a granddaughter/[name] who is very involved. Patient is unsure whether she has a documented HCPOA - she doesn't think so. She has difficulty telling me if she wants to name an HCPOA, but eventually is able to tell me she would designate [name].   Patient lives at [facility]. As far as functional status, there has been progressive decline over the past few months. She has had difficulty ambulating, perhaps due to progressive LE edema.  Discussed that there is concern whether the ALF can continue to meet her increasing care requirements.   We discussed her current illness and what it means in the larger context of her ongoing co-morbidities.  Natural disease trajectory of heart failure was discussed, emphasizing it is a progressive and non-curable illness characterized by acute exacerbations. Patient verbalizes understanding that overall prognosis is poor and states "I don't think I have long left".   Patient reports that she has been very satisfied with hospice services and wants to continue this care. We reviewed that hospice is about supporting a patient where they are, while allowing a natural course to occur. She verbalizes understanding.   19:30 - I spoke with patient's granddaughter/[name] by phone. [name] tells me she is patient's documented HCPOA, but that there has been discussion between patient and family about having this changed to [place]. Offered to assist with creating a new HCPOA document. Discussed concern whether the ALF can continue to meet her increasing care requirements. We also reviewed that hospice services would have to be rescinded if she goes to SNF/rehab. [name] reports that family is willing/able to cover long-term placement.    Primary decision maker: Patient can make her own decisions.  Granddaughter [name] is current documented HCPOA. Patient and family want this changed to daughter/[name].     SUMMARY OF RECOMMENDATIONS   Continue current care Spiritual care consult placed to assist with creating a new HCPOA document Family is willing/able to financially cover long-term SNF if needed Continue hospice services  No additional PMT needs at this time, please call [phone] if we can be of additional assistance or need to actively re-engage with this patient and family  Code Status/Advance Care Planning: DNR  Symptom Management:  Continue home pain med regimen  scheduled oxycodone and  oxycodone-acetaminophen 4 times daily Oxycodone every 4 hours prn for breakthrough pain  Additional Recommendations (Limitations, Scope, Preferences): Avoid Hospitalization  Prognosis:  < 6 months  Discharge Planning: To Be Determined      Primary Diagnoses: Present on Admission:  Acute on chronic diastolic CHF (congestive heart failure) (HCC)  Atrial fibrillation (HCC)  Chronic back pain  Chronic obstructive pulmonary disease (HCC)  Depression  Hypertension associated with diabetes ([place])  Hyperlipidemia associated with type 2 diabetes mellitus ([place])  Hypothyroid   I have reviewed the medical record, interviewed the patient and family, and examined the patient. The following aspects are pertinent.  Past Medical History:  Diagnosis Date   Bursitis    CAD (coronary artery disease)    a. nonobstructive CAD with 40% Prox LAD stenosis by cath in 2007   COPD (chronic obstructive pulmonary disease) (HCC)    Depression    DJD (degenerative joint disease)    GERD (gastroesophageal reflux disease)    Hyperlipemia    Hypertension    Left wrist fracture    Obesities, morbid (HCC)    Open left ankle fracture    Syncope 11/2017   Urinary incontinence      Family History  Problem Relation Age of Onset   Lung disease Mother    Heart attack Father    Lung disease Other    Heart attack Other    Cancer Daughter        breast   Scheduled Meds:  (feeding supplement) PROSource Plus  30 mL Oral BID BM   aspirin EC  81 mg Oral Daily   atorvastatin  10 mg Oral Daily   busPIRone  20 mg Oral TID   carvedilol  3.125 mg Oral BID WC   docusate sodium  100 mg Oral BID   enoxaparin (LOVENOX) injection  40 mg Subcutaneous Q24H   furosemide  40 mg Intravenous BID   levothyroxine  88 mcg Oral Q0600   LORazepam  0.5 mg Oral BID   oxyCODONE-acetaminophen  1 tablet Oral 4 times per day   And   oxyCODONE  5 mg Oral 4 times per day   pantoprazole  40 mg Oral QPM   polyethylene glycol   17 g Oral Daily   psyllium  1 packet Oral QHS   senna  2 tablet Oral QHS   sertraline  200 mg Oral Daily   sodium chloride flush  3 mL Intravenous Q12H   tamsulosin  0.4 mg Oral QHS   triamcinolone 0.1 % cream : eucerin   Topical BID    PRN Meds:.acetaminophen **OR** acetaminophen, albuterol, bisacodyl, hydrALAZINE, ondansetron **OR** ondansetron (ZOFRAN) IV, oxyCODONE, polyethylene glycol, traZODone   Allergies  Allergen Reactions   Iodinated Diagnostic Agents Anaphylaxis   Penicillins Hives, Itching and Rash    Has patient had a PCN reaction causing immediate rash, facial/tongue/throat swelling, SOB or lightheadedness with hypotension Yes Has patient had a  PCN reaction causing severe rash involving mucus membranes or skin necrosis: Yes Has patient had a PCN reaction that required hospitalization No Has patient had a PCN reaction occurring within the last 10 years: No If all of the above answers are "NO", then may proceed with Cephalosporin use.    Sulfa Antibiotics Hives, Itching and Rash   Penicillin G Hives, Itching and Rash   Review of Systems  Musculoskeletal:  Positive for back pain.   Physical Exam Vitals reviewed.  Constitutional:      General: She is not in acute distress.    Appearance: She is obese.     Comments: Chronically ill-appearing  Pulmonary:     Effort: Pulmonary effort is normal.  Musculoskeletal:     Right lower leg: Edema present.     Left lower leg: Edema present.  Neurological:     Mental Status: She is alert and oriented to person, place, and time.     Motor: Weakness present.  Psychiatric:        Behavior: Behavior normal.    Vital Signs: BP (!) 100/50 (BP Location: Left Wrist)    Pulse 82    Temp 98.1 F (36.7 C) (Oral)    Resp 19    Wt 125.7 kg    SpO2 91%    BMI 46.11 kg/m  Pain Scale: 0-10   Pain Score: 0-No pain   SpO2: SpO2: 91 % O2 Device:SpO2: 91 % O2 Flow Rate: .O2 Flow Rate (L/min): 2 L/min  IO: Intake/output summary:   Intake/Output Summary (Last 24 hours) at 07/15/2021 1729 Last data filed at 07/15/2021 1445 Gross per 24 hour  Intake 980 ml  Output 3800 ml  Net -2820 ml    LBM: Last BM Date: 07/13/21 Baseline Weight: Weight: 125.7 kg Most recent weight: Weight: 125.7 kg      Palliative Assessment/Data: PPS 30-40%     Time In: 1645 Time Out: 1730 Time In: 1930 Time Out: 2000 Time Total: 75 minutes Greater than 50%  of this time was spent counseling and coordinating care related to the above assessment and plan.  Signed by: Lavena Bullion, NP   Please contact Palliative Medicine Team phone at [phone] for questions and concerns.  For individual provider: See Shea Evans

## 2021-07-15 NOTE — Progress Notes (Addendum)
[place] [facility]) hospitalized hospice patient visit  [name] is a current [facility] hospice patient with a terminal diagnosis of hypertensive heart disease. Shirley Leblanc has been having worsening swelling and weakness over the last few days and began having chest pain along over the past 2 days. She was out to a family function and walked more than normal and said that this exacerbated her symptoms. Per request of the patient facility activated EMS and patient has been admitted to the hospital. She was admitted on 12.18 with a diagnosis of congestive heart failure. Per Dr. Gildardo Cranker with [facility] this is a related hospice admission.  Visited with patient and her granddaughter at bedside. She reports to feeling a little less short of breath today. She relays that she has been feeling worse over the last couple of weeks and that the weakness has been getting worse. Patient relays that it is her desire to return to [facility] but she does not know if she will be able too.  Patient is inpatient appropriate due to need for IV diuretics.   Vital Signs-  97.9/102/20   110/63   spO2 93% 2L Intake/Output- 740/4500 Abnormal labs-  BNP 215, Chloride 93, CO2 35, RBC 3.78, Hgb 10.2, Hct 33.9, Platelets 123 Diagnostics-  CHEST - 2 VIEW   COMPARISON:  Chest radiograph 04/02/2021; CT angio chest 09/20/2020   FINDINGS: Stable mildly enlarged cardiac silhouette. Aortic calcifications. Both lungs are clear. No pleural effusion or pneumothorax. Multilevel degenerative changes in the mid and distal thoracic spine. Spinal stimulator overlies the epidural space at the level of the distal thoracic spine. Partially visualized anterior cervical spinal fusion. Posttraumatic deformity of the left humeral head. No acute osseous abnormality.   IMPRESSION: Stable mild cardiomegaly without overt pulmonary edema.   IV/PRN Meds-  Lasix 40mg  BID, MSO4 2MG  x1,  Problem List   Acute on chronic  diastolic CHF (congestive heart failure) ([place]), severe TR -Chest x-ray without overt pulmonary edema, however presented with anasarca, volume overload, pitting edema.  BNP 215 -Received Lasix 40 mg IV x1 in ED, started on Lasix diuresis 40 mg IV twice daily, good output and diuresis, negative balance of 3.6 L.  Obtain daily weights -2D echo showed EF of 60 to 65%, mild LVH, no R WMA, interventricular septum is flattened in systole and diastole consistent with right ventricular pressure and volume overload.  Right ventricular systolic function moderately reduced.  Severe TR, mild MR, dilatation of the ascending aorta measuring 41 mm -Continue Coreg, lisinopril, monitor renal function closely Addendum: 5:25pm Mildly elevated D dimer, will check Venous doppler LE. If negative, hypoxia/tachycardia, will obtain CTA chest   Discharge Planning- Ongoing, patient evaluated today by PT/OT, possible rehab? Family Contact- granddaughter [name] at bedside IDT- Updated Goals of care - Ongoing, patient wants to return to her facility and relays that she feels her time is very limited  Should patient need ambulance transfer at discharge please use [facility]) as they contract this service for our active hospice patients.  Hospital transfer and medication list placed on patient's hard chart.  [name], Therapist, sports, BSN, [facility] Liaison [phone]

## 2021-07-15 NOTE — Evaluation (Signed)
Physical Therapy Evaluation Patient Details Name: Shirley Leblanc MRN: [id number] DOB: [birth year] Today's Date: 07/15/2021  History of Present Illness  Shirley Leblanc is a 75 y.o. female with medical history significant of CAD; diastolic CHF; COPD; HTN; HLD; and obesity presenting with SOB.   She was previously admitted from 9/6-12 with acute on chronic diastolic CHF and was discharged back to ALF with hospice.  She is increasingly non-ambulatory and has worsening edema.  Clinical Impression  Patient presents with decreased mobility due to generalized weakness, decreased balance and decreased activity tolerance.  She may benefit from skilled PT in the acute setting to maximize mobility, but may need higher level of care at d/c than ALF.  PT recommending SNF at d/c with Hospice following.         Recommendations for follow up therapy are one component of a multi-disciplinary discharge planning process, led by the attending physician.  Recommendations may be updated based on patient status, additional functional criteria and insurance authorization.  Follow Up Recommendations Skilled nursing-short term rehab (<3 hours/day) (STSNF with Hospice services)    Assistance Recommended at Discharge Intermittent Supervision/Assistance  Functional Status Assessment Patient has had a recent decline in their functional status and/or demonstrates limited ability to make significant improvements in function in a reasonable and predictable amount of time  Equipment Recommendations  None recommended by PT    Recommendations for Other Services       Precautions / Restrictions Precautions Precautions: Fall      Mobility  Bed Mobility Overal bed mobility: Needs Assistance Bed Mobility: Supine to Sit     Supine to sit: HOB elevated;Min assist     General bed mobility comments: increased time    Transfers Overall transfer level: Needs assistance Equipment used: Rolling walker (2 wheels) Transfers:  Sit to/from Stand;Bed to chair/wheelchair/BSC Sit to Stand: Min assist;From elevated surface Stand pivot transfers: Min assist         General transfer comment: assist for balance and safety stand step to recliner with RW, cues for positioning    Ambulation/Gait               General Gait Details: deferred due to pt fatigue  Stairs            Wheelchair Mobility    Modified Rankin (Stroke Patients Only)       Balance Overall balance assessment: Needs assistance Sitting-balance support: Feet supported Sitting balance-Leahy Scale: Fair     Standing balance support: Bilateral upper extremity supported Standing balance-Leahy Scale: Poor Standing balance comment: reliant on B UEs                             Pertinent Vitals/Pain Pain Assessment: Faces Faces Pain Scale: Hurts little more Pain Location: L shoulder with elevation Pain Descriptors / Indicators: Grimacing;Guarding Pain Intervention(s): Monitored during session;Limited activity within patient's tolerance    Home Living Family/patient expects to be discharged to:: Skilled nursing facility                   Additional Comments: per granddaughter, family thinks pt needs a higher level of care, pt prefers to go back to ALF    Prior Function Prior Level of Function : Needs assist             Mobility Comments: uses motorized w/c to get to dining room ADLs Comments: can self feed and grooming, assisted for bathing, dressing and  toileting     Hand Dominance   Dominant Hand: Right    Extremity/Trunk Assessment   Upper Extremity Assessment Upper Extremity Assessment: Defer to OT evaluation LUE Deficits / Details: longstanding shoulder limitations from prior fx LUE Coordination: decreased gross motor    Lower Extremity Assessment Lower Extremity Assessment: Generalized weakness (edema throughout LE's)    Cervical / Trunk Assessment Cervical / Trunk Assessment: Other  exceptions (obesity)  Communication   Communication: HOH  Cognition Arousal/Alertness: Awake/alert Behavior During Therapy: WFL for tasks assessed/performed Overall Cognitive Status: Impaired/Different from baseline Area of Impairment: Memory;Following commands;Safety/judgement;Awareness;Problem solving;Attention                   Current Attention Level: Sustained Memory: Decreased short-term memory Following Commands: Follows one step commands with increased time Safety/Judgement: Decreased awareness of safety;Decreased awareness of deficits Awareness: Emergent Problem Solving: Slow processing;Decreased initiation;Difficulty sequencing;Requires verbal cues          General Comments General comments (skin integrity, edema, etc.): grandaughter present and helping to encourage pt for OOB    Exercises     Assessment/Plan    PT Assessment Patient needs continued PT services  PT Problem List Decreased strength;Decreased mobility;Decreased activity tolerance;Decreased balance;Decreased knowledge of use of DME;Decreased safety awareness       PT Treatment Interventions DME instruction;Therapeutic activities;Gait training;Therapeutic exercise;Patient/family education;Balance training;Functional mobility training    PT Goals (Current goals can be found in the Care Plan section)  Acute Rehab PT Goals Patient Stated Goal: to have higher level of care PT Goal Formulation: With family Time For Goal Achievement: 07/29/21 Potential to Achieve Goals: Fair    Frequency Min 2X/week   Barriers to discharge        Co-evaluation PT/OT/SLP Co-Evaluation/Treatment: Yes Reason for Co-Treatment: To address functional/ADL transfers PT goals addressed during session: Mobility/safety with mobility;Balance OT goals addressed during session: ADL's and self-care       AM-PAC PT "6 Clicks" Mobility  Outcome Measure Help needed turning from your back to your side while in a flat bed  without using bedrails?: A Little Help needed moving from lying on your back to sitting on the side of a flat bed without using bedrails?: A Little Help needed moving to and from a bed to a chair (including a wheelchair)?: A Little Help needed standing up from a chair using your arms (e.g., wheelchair or bedside chair)?: A Little Help needed to walk in hospital room?: Total Help needed climbing 3-5 steps with a railing? : Total 6 Click Score: 14    End of Session Equipment Utilized During Treatment: Gait belt Activity Tolerance: Patient limited by fatigue Patient left: in chair;with chair alarm set;with call bell/phone within reach;with family/visitor present Nurse Communication: Mobility status PT Visit Diagnosis: Other abnormalities of gait and mobility (R26.89);Muscle weakness (generalized) (M62.81)    Time: 1210-1240 PT Time Calculation (min) (ACUTE ONLY): 30 min   Charges:   PT Evaluation $PT Eval Moderate Complexity: 1 Mod          Cyndi Abella Shugart, PT Acute Rehabilitation Services WCBJS:[phone] [phone] 07/15/2021   Reginia Naas 07/15/2021, 1:37 PM

## 2021-07-15 NOTE — Progress Notes (Signed)
Mobility Specialist Progress Note:   07/15/21 1331  Mobility  Activity Transferred:  Chair to bed  Level of Assistance Moderate assist, patient does 50-74%  Assistive Device Other (Comment) (HHA)  Distance Ambulated (ft) 2 ft  Mobility Ambulated with assistance in room  Mobility Response Tolerated well  Mobility performed by Mobility specialist  $Mobility charge 1 Mobility   Pt received in bed asking to go back to bed. No assistance to stand but pat needed modA to pivot to bed. Pt left in bed with call bell in reach and all needs met.   [facility] Public librarian Phone [phone] Secondary Phone [phone]

## 2021-07-15 NOTE — TOC Initial Note (Signed)
Transition of Care [facility]) - Initial/Assessment Note    Patient Details  Name: HAVEN PYLANT MRN: [id number] Date of Birth: Jan 26, 1946  Transition of Care [facility] CM/SW Contact:    Tresa Endo Phone Number: 07/15/2021, 4:20 PM  Clinical Narrative:                 CSW spoke with pt granddaughter who explained that pt would like to go back to [place] but the pt children thinks that the pt needs LTC. She is being followed but Hospice but family thinks she needs more help. CSW faxed pt out with granddaughters permission requesting LTC and will follow up with bed offers tomorrow.   Expected Discharge Plan: Skilled Nursing Facility Barriers to Discharge: Continued Medical Work up   Patient Goals and CMS Choice Patient states their goals for this hospitalization and ongoing recovery are:: Rehab CMS Medicare.gov Compare Post Acute Care list provided to:: Patient Choice offered to / list presented to : Patient  Expected Discharge Plan and Services Expected Discharge Plan: [place] In-house Referral: Clinical Social Work   Post Acute Care Choice: [place] Living arrangements for the past 2 months: [place]                                      Prior Living Arrangements/Services Living arrangements for the past 2 months: [place] Lives with:: Facility Resident Patient language and need for interpreter reviewed:: Yes Do you feel safe going back to the place where you live?: Yes      Need for Family Participation in Patient Care: Yes (Comment) Care giver support system in place?: Yes (comment)   Criminal Activity/Legal Involvement Pertinent to Current Situation/Hospitalization: No - Comment as needed  Activities of Daily Living Home Assistive Devices/Equipment: Wheelchair, Environmental consultant (specify type), Cane (specify quad or straight), Raised toilet seat with rails, Shower chair with back, Grab bars in shower, Grab  bars around toilet ADL Screening (condition at time of admission) Patient's cognitive ability adequate to safely complete daily activities?: No Is the patient deaf or have difficulty hearing?: Yes Does the patient have difficulty seeing, even when wearing glasses/contacts?: Yes Does the patient have difficulty concentrating, remembering, or making decisions?: No Patient able to express need for assistance with ADLs?: Yes Does the patient have difficulty dressing or bathing?: Yes Independently performs ADLs?: No Communication: Independent Dressing (OT): Needs assistance Is this a change from baseline?: Pre-admission baseline Grooming: Needs assistance Is this a change from baseline?: Pre-admission baseline Feeding: Independent Bathing: Needs assistance Is this a change from baseline?: Pre-admission baseline Toileting: Needs assistance Is this a change from baseline?: Pre-admission baseline In/Out Bed: Needs assistance Is this a change from baseline?: Pre-admission baseline Walks in Home: Needs assistance Is this a change from baseline?: Pre-admission baseline Does the patient have difficulty walking or climbing stairs?: Yes Weakness of Legs: Both Weakness of Arms/Hands: None  Permission Sought/Granted Permission sought to share information with : Facility Art therapist granted to share information with : Yes, Verbal Permission Granted  Share Information with NAME: Lamar Benes (Granddaughter)   [phone]  Permission granted to share info w AGENCY: SNF  Permission granted to share info w Relationship: Lamar Benes (Granddaughter)   [phone]  Permission granted to share info w Contact Information: Lamar Benes (Granddaughter)   [phone]  Emotional Assessment Appearance:: Appears stated age Attitude/Demeanor/Rapport: Engaged Affect (typically observed): Appropriate Orientation: :  Oriented to Self, Oriented to Place, Oriented to  Time, Oriented  to Situation Alcohol / Substance Use: Not Applicable Psych Involvement: No (comment)  Admission diagnosis:  Anasarca [R60.1] Acute on chronic diastolic CHF (congestive heart failure) (HCC) [I50.33] Acute CHF (congestive heart failure) (HCC) [I50.9] Patient Active Problem List   Diagnosis Date Noted   Acute CHF (congestive heart failure) (HCC) 07/15/2021   Chronic obstructive pulmonary disease (HCC)    Acute on chronic diastolic CHF (congestive heart failure) ([place]) 04/02/2021   Stasis dermatitis of both legs 04/02/2021   Dizziness    Tremor 09/18/2017   Encephalopathy    Tremors of nervous system    Near syncope 09/16/2017   Syncope 09/15/2017   CAD (coronary artery disease) 09/15/2017   At HIGH RISK for fall 05/15/2017   Chest pain 04/28/2017   Osteopenia 02/02/2017   Morbid obesity ([place]) 11/13/2016   Elevated troponin 11/13/2016   Chronic back pain 10/10/2016   Pain medication agreement signed 10/10/2016   Opioid dependence ([place]) 10/10/2016   Diabetes ([place]) 09/19/2016   Depression 04/18/2016   Hypothyroid 02/22/2016   Hyperlipidemia associated with type 2 diabetes mellitus ([place]) 02/22/2016   Chronic anticoagulation - Eliquis, CHADS2VASC=3 06/05/2015   Atrial fibrillation ([place]) 05/16/2015   S/P laparoscopic cholecystectomy    Hypertension associated with diabetes ([place]) 05/09/2015   Dyspnea on exertion 03/14/2011   PCP:  Reymundo Poll, MD Pharmacy:   [place], [place] - [address] [place] [address] Phone: [phone] Fax: [phone]     Social Determinants of Health (SDOH) Interventions    Readmission Risk Interventions No flowsheet data found.

## 2021-07-15 NOTE — NC FL2 (Signed)
[place] MEDICAID FL2 LEVEL OF CARE SCREENING TOOL     IDENTIFICATION  Patient Name: Shirley Leblanc Birthdate: 1945-10-15 Sex: female Admission Date (Current Location): 07/14/2021  [facility] [place] Number:  Herbalist and Address:  [place]. [facility] [place] [address], [place], [place] [address]      Provider Number: [id number]  Attending Physician Name and Address:  Mendel Corning, MD  Relative Name and Phone Number:  Lamar Benes (Granddaughter)   [phone]    Current Level of Care: Hospital Recommended Level of Care: [place] Prior Approval Number:    Date Approved/Denied:   PASRR Number:    Discharge Plan: SNF    Current Diagnoses: Patient Active Problem List   Diagnosis Date Noted   Acute CHF (congestive heart failure) ([place]) 07/15/2021   Chronic obstructive pulmonary disease (HCC)    Acute on chronic diastolic CHF (congestive heart failure) ([place]) 04/02/2021   Stasis dermatitis of both legs 04/02/2021   Dizziness    Tremor 09/18/2017   Encephalopathy    Tremors of nervous system    Near syncope 09/16/2017   Syncope 09/15/2017   CAD (coronary artery disease) 09/15/2017   At HIGH RISK for fall 05/15/2017   Chest pain 04/28/2017   Osteopenia 02/02/2017   Morbid obesity ([place]) 11/13/2016   Elevated troponin 11/13/2016   Chronic back pain 10/10/2016   Pain medication agreement signed 10/10/2016   Opioid dependence ([place]) 10/10/2016   Diabetes ([place]) 09/19/2016   Depression 04/18/2016   Hypothyroid 02/22/2016   Hyperlipidemia associated with type 2 diabetes mellitus ([place]) 02/22/2016   Chronic anticoagulation - Eliquis, CHADS2VASC=3 06/05/2015   Atrial fibrillation ([place]) 05/16/2015   S/P laparoscopic cholecystectomy    Hypertension associated with diabetes ([place]) 05/09/2015   Dyspnea on exertion 03/14/2011    Orientation RESPIRATION BLADDER Height & Weight     Self, Time, Situation, Place  O2 Incontinent,  External catheter Weight: 277 lb 1.9 oz (125.7 kg) Height:     BEHAVIORAL SYMPTOMS/MOOD NEUROLOGICAL BOWEL NUTRITION STATUS        Diet (See DC Summary)  AMBULATORY STATUS COMMUNICATION OF NEEDS Skin   Limited Assist Verbally                         Personal Care Assistance Level of Assistance  Bathing, Feeding, Dressing Bathing Assistance: Limited assistance Feeding assistance: Independent Dressing Assistance: Limited assistance     Functional Limitations Info  Sight, Hearing, Speech Sight Info: Adequate Hearing Info: Adequate Speech Info: Adequate    SPECIAL CARE FACTORS FREQUENCY  PT (By licensed PT), OT (By licensed OT)     PT Frequency: 5x a week OT Frequency: 5x a week            Contractures Contractures Info: Not present    Additional Factors Info  Code Status, Allergies Code Status Info: DNR Allergies Info: Iodinated Diagnostic Agents   Penicillins   Sulfa Antibiotics   Penicillin G           Current Medications (07/15/2021):  This is the current hospital active medication list Current Facility-Administered Medications  Medication Dose Route Frequency Provider Last Rate Last Admin   (feeding supplement) PROSource Plus liquid 30 mL  30 mL Oral BID BM Karmen Bongo, MD   30 mL at 07/15/21 1440   acetaminophen (TYLENOL) tablet 650 mg  650 mg Oral Q6H PRN Karmen Bongo, MD       Or  acetaminophen (TYLENOL) suppository 650 mg  650 mg Rectal Q6H PRN Karmen Bongo, MD       albuterol (PROVENTIL) (2.5 MG/3ML) 0.083% nebulizer solution 2.5 mg  2.5 mg Inhalation QID PRN Karmen Bongo, MD       aspirin EC tablet 81 mg  81 mg Oral Daily Karmen Bongo, MD   81 mg at 07/15/21 5916   atorvastatin (LIPITOR) tablet 10 mg  10 mg Oral Daily Karmen Bongo, MD   10 mg at 07/15/21 3846   bisacodyl (DULCOLAX) EC tablet 5 mg  5 mg Oral Daily PRN Karmen Bongo, MD       busPIRone (BUSPAR) tablet 20 mg  20 mg Oral TID Karmen Bongo, MD   20 mg at 07/15/21  0918   carvedilol (COREG) tablet 3.125 mg  3.125 mg Oral BID WC Karmen Bongo, MD   3.125 mg at 07/15/21 0858   docusate sodium (COLACE) capsule 100 mg  100 mg Oral BID Karmen Bongo, MD   100 mg at 07/15/21 0917   enoxaparin (LOVENOX) injection 40 mg  40 mg Subcutaneous Q24H Karmen Bongo, MD   40 mg at 07/15/21 1439   furosemide (LASIX) injection 40 mg  40 mg Intravenous BID Karmen Bongo, MD   40 mg at 07/15/21 0858   hydrALAZINE (APRESOLINE) injection 5 mg  5 mg Intravenous Q4H PRN Karmen Bongo, MD       levothyroxine (SYNTHROID) tablet 88 mcg  88 mcg Oral Q0600 Karmen Bongo, MD   88 mcg at 07/15/21 6599   lisinopril (ZESTRIL) tablet 40 mg  40 mg Oral Daily Karmen Bongo, MD   40 mg at 07/15/21 3570   LORazepam (ATIVAN) tablet 0.5 mg  0.5 mg Oral BID Karmen Bongo, MD   0.5 mg at 07/15/21 0918   ondansetron (ZOFRAN) tablet 4 mg  4 mg Oral Q6H PRN Karmen Bongo, MD       Or   ondansetron [facility]) injection 4 mg  4 mg Intravenous Q6H PRN Karmen Bongo, MD       oxyCODONE (Oxy IR/ROXICODONE) immediate release tablet 5 mg  5 mg Oral Q4H PRN Rai, Ripudeep K, MD       oxyCODONE-acetaminophen (PERCOCET/ROXICET) 5-325 MG per tablet 1 tablet  1 tablet Oral 4 times per day Rai, Ripudeep K, MD       And   oxyCODONE (Oxy IR/ROXICODONE) immediate release tablet 5 mg  5 mg Oral 4 times per day Rai, Ripudeep K, MD       pantoprazole (PROTONIX) EC tablet 40 mg  40 mg Oral QPM Karmen Bongo, MD   40 mg at 07/14/21 2109   polyethylene glycol (MIRALAX / GLYCOLAX) packet 17 g  17 g Oral Daily Karmen Bongo, MD   17 g at 07/15/21 1779   polyethylene glycol (MIRALAX / GLYCOLAX) packet 17 g  17 g Oral Daily PRN Karmen Bongo, MD       psyllium (HYDROCIL/METAMUCIL) 1 packet  1 packet Oral Ivery Quale, MD   1 packet at 07/14/21 2109   senna (SENOKOT) tablet 17.2 mg  2 tablet Oral Ivery Quale, MD   17.2 mg at 07/14/21 2109   sertraline (ZOLOFT) tablet 200 mg  200 mg Oral  Daily Karmen Bongo, MD   200 mg at 07/15/21 0917   sodium chloride flush (NS) 0.9 % injection 3 mL  3 mL Intravenous Lillia [place], MD   3 mL at 07/15/21 0900   tamsulosin (FLOMAX) capsule 0.4 mg  0.4  mg Oral Ivery Quale, MD   0.4 mg at 07/14/21 2109   traZODone (DESYREL) tablet 25 mg  25 mg Oral QHS PRN Karmen Bongo, MD       triamcinolone 0.1 % cream : eucerin cream, 1:1   Topical BID Karmen Bongo, MD   Given at 07/15/21 [phone]     Discharge Medications: Please see discharge summary for a list of discharge medications.  Relevant Imaging Results:  Relevant Lab Results:   Additional Information [place], [place]

## 2021-07-15 NOTE — Progress Notes (Addendum)
[facility]                                                                              Patient Demographics  Shirley Leblanc, is a 75 y.o. female, DOB - 1946/01/27, KGM:[id number]  Admit date - 07/14/2021   Admitting Physician Karmen Bongo, MD  Outpatient Primary MD for the patient is Reymundo Poll, MD  Outpatient specialists:   LOS - 0  days   Medical records reviewed and are as summarized below:    Chief Complaint  Patient presents with   Chest Pain   Shortness of Breath   Edema       Brief summary   Patient is a 75 year old female with history of CAD, diastolic CHF, COPD, hypertension, hyperlipidemia presented with shortness of breath.  Patient was previously admitted from 9/6-9/12 with acute on chronic diastolic CHF and was discharged to ALF with hospice.  Patient and daughter voiced concerns about the facility's ability to care for her.  She is increasingly nonambulatory and has been having worsening edema.  Patient reported dyspnea on exertion and is on her usual home O2. Patient was found to be in volume overload, mild pitting edema/anasarca.  She has been taking torsemide daily.  She was given Lasix 40 mg IV x1   Assessment & Plan    Principal Problem:   Acute on chronic diastolic CHF (congestive heart failure) ([place]), severe TR -Chest x-ray without overt pulmonary edema, however presented with anasarca, volume overload, pitting edema.  BNP 215 -Received Lasix 40 mg IV x1 in ED, started on Lasix diuresis 40 mg IV twice daily, good output and diuresis, negative balance of 3.6 L.  Obtain daily weights -2D echo showed EF of 60 to 65%, mild LVH, no R WMA, interventricular septum is flattened in systole and diastole consistent with right ventricular pressure and volume overload.  Right ventricular systolic function moderately reduced.  Severe TR, mild MR, dilatation of the ascending aorta measuring 41 mm -Continue Coreg, lisinopril, monitor renal function  closely Addendum: 5:25pm Mildly elevated D dimer, will check Venous doppler LE. If negative, hypoxia/tachycardia, will obtain CTA chest    Active Problems:   Hypertension associated with diabetes ([place]) -BP currently stable, continue lisinopril, Coreg, Lasix, hydralazine as needed    Atrial fibrillation (HCC) -Heart rate controlled, continue Coreg - not on anticoagulation, continue 81 mg aspirin     Hypothyroidism -Continue Synthroid, follow TSH    Hyperlipidemia associated with type 2 diabetes mellitus ([place]) -Continue Lipitor    Depression -Continue BuSpar, Ativan, Zoloft     Diabetes mellitus type II, ([place]) not on any medications Hemoglobin A1c 6.2 on 04/02/2021, repeat in a.m. -Follow CBGs closely, may need sliding scale insulin if trending higher.  Fasting blood sugar 99 this a.m.    Chronic back pain, osteoarthritis of the knees -Patient very concerned about her pain medications, does not want to be on home morphine oral.  She states that oxycodone works better.   -will continue Percocet per her schedule, a.m., lunch, evening and bedtime, oxycodone as needed as needed for breakthrough pain -Monitor closely for any respiratory depression, excessive sedation.  Chronic obstructive pulmonary disease (HCC) Currently stable, no wheezing  Constipation -Per patient, no BM in the last 2 to 3 days, continue MiraLAX, psyllium, senna   Stasis dermatitis -Continue triamcinolone cream -Unna boots once peripheral edema is improved  Obesity Estimated body mass index is 46.11 kg/m as calculated from the following:   Height as of 04/02/21: 5\' 5"  (1.651 m).   Weight as of this encounter: 125.7 kg. -PT OT evaluation, may need higher level of care, was an ALF with hospice  Code Status: DNR DVT Prophylaxis:  enoxaparin (LOVENOX) injection 40 mg Start: 07/14/21 1515   Level of Care: Level of care: Telemetry Cardiac Family Communication: Discussed all imaging results, lab results,  explained to the patient    Disposition Plan:     Status is: Observation  The patient remains OBS appropriate and will d/c before 2 midnights.     Time Spent in minutes   35 minutes Procedures:  2D echo  Consultants:   None  Antimicrobials:   Anti-infectives (From admission, onward)    None          Medications  Scheduled Meds:  (feeding supplement) PROSource Plus  30 mL Oral BID BM   aspirin EC  81 mg Oral Daily   atorvastatin  10 mg Oral Daily   busPIRone  20 mg Oral TID   carvedilol  3.125 mg Oral BID WC   docusate sodium  100 mg Oral BID   enoxaparin (LOVENOX) injection  40 mg Subcutaneous Q24H   furosemide  40 mg Intravenous BID   levothyroxine  88 mcg Oral Q0600   lisinopril  40 mg Oral Daily   LORazepam  0.5 mg Oral BID   oxyCODONE-acetaminophen  1 tablet Oral 4 times per day   And   oxyCODONE  5 mg Oral 4 times per day   pantoprazole  40 mg Oral QPM   polyethylene glycol  17 g Oral Daily   psyllium  1 packet Oral QHS   senna  2 tablet Oral QHS   sertraline  200 mg Oral Daily   sodium chloride flush  3 mL Intravenous Q12H   tamsulosin  0.4 mg Oral QHS   triamcinolone 0.1 % cream : eucerin   Topical BID   Continuous Infusions: PRN Meds:.acetaminophen **OR** acetaminophen, albuterol, bisacodyl, hydrALAZINE, ondansetron **OR** ondansetron (ZOFRAN) IV, oxyCODONE, polyethylene glycol, traZODone      Subjective:   Shirley Leblanc was seen and examined today.  States does not like morphine sublingual and wants to continue Percocet and oxycodone outpatient regimen.   Denies any dizziness, chest pain,  abdominal pain, N/V, + constipation Objective:   Vitals:   07/14/21 2321 07/15/21 0406 07/15/21 0734 07/15/21 1100  BP: 119/71 (!) 114/58 110/63 106/62  Pulse: 100 83 (!) 102 87  Resp: (!) 21 19 20 19   Temp: 98.1 F (36.7 C) 98 F (36.7 C) 97.9 F (36.6 C)   TempSrc:   Oral   SpO2: 99% 96% 95% 93%  Weight:  125.7 kg      Intake/Output Summary  (Last 24 hours) at 07/15/2021 1151 Last data filed at 07/15/2021 1131 Gross per 24 hour  Intake 740 ml  Output 4500 ml  Net -3760 ml     Wt Readings from Last 3 Encounters:  07/15/21 125.7 kg  04/08/21 116.9 kg  12/25/20 129.3 kg     Exam General: Alert and oriented x 3, NAD Cardiovascular: S1 S2 auscultated, RRR Respiratory: Diminished breath sounds at bases Gastrointestinal:  Soft, NT, ND, NBS Ext: 2+ pedal edema bilaterally Neuro: no new deficits Skin: Bilateral stasis dermatitis Psych: Normal affect and demeanor, alert and oriented x3    Data Reviewed:  I have personally reviewed following labs and imaging studies  Micro Results Recent Results (from the past 240 hour(s))  Resp Panel by RT-PCR (Flu A&B, Covid) Nasopharyngeal Swab     Status: None   Collection Time: 07/14/21  1:08 PM   Specimen: Nasopharyngeal Swab; Nasopharyngeal(NP) swabs in vial transport medium  Result Value Ref Range Status   SARS Coronavirus 2 by RT PCR NEGATIVE NEGATIVE Final    Comment: (NOTE) SARS-CoV-2 target nucleic acids are NOT DETECTED.  The SARS-CoV-2 RNA is generally detectable in upper respiratory specimens during the acute phase of infection. The lowest concentration of SARS-CoV-2 viral copies this assay can detect is 138 copies/mL. A negative result does not preclude SARS-Cov-2 infection and should not be used as the sole basis for treatment or other patient management decisions. A negative result may occur with  improper specimen collection/handling, submission of specimen other than nasopharyngeal swab, presence of viral mutation(s) within the areas targeted by this assay, and inadequate number of viral copies(<138 copies/mL). A negative result must be combined with clinical observations, patient history, and epidemiological information. The expected result is Negative.  Fact Sheet for Patients:  EntrepreneurPulse.com.au  Fact Sheet for Healthcare  Providers:  IncredibleEmployment.be  This test is no t yet approved or cleared by the [place] FDA and  has been authorized for detection and/or diagnosis of SARS-CoV-2 by FDA under an Emergency Use Authorization (EUA). This EUA will remain  in effect (meaning this test can be used) for the duration of the COVID-19 declaration under Section 564(b)(1) of the Act, 21 U.S.C.section 360bbb-3(b)(1), unless the authorization is terminated  or revoked sooner.       Influenza A by PCR NEGATIVE NEGATIVE Final   Influenza B by PCR NEGATIVE NEGATIVE Final    Comment: (NOTE) The Xpert Xpress SARS-CoV-2/FLU/RSV plus assay is intended as an aid in the diagnosis of influenza from Nasopharyngeal swab specimens and should not be used as a sole basis for treatment. Nasal washings and aspirates are unacceptable for Xpert Xpress SARS-CoV-2/FLU/RSV testing.  Fact Sheet for Patients: EntrepreneurPulse.com.au  Fact Sheet for Healthcare Providers: IncredibleEmployment.be  This test is not yet approved or cleared by the [place] FDA and has been authorized for detection and/or diagnosis of SARS-CoV-2 by FDA under an Emergency Use Authorization (EUA). This EUA will remain in effect (meaning this test can be used) for the duration of the COVID-19 declaration under Section 564(b)(1) of the Act, 21 U.S.C. section 360bbb-3(b)(1), unless the authorization is terminated or revoked.  Performed at [facility], [place] [address] [place], [place] [address]     Radiology Reports DG Chest 2 View  Result Date: 07/14/2021 CLINICAL DATA:  Chest pain. Patient complains of fluid retention. Shortness of breath with exertion. EXAM: CHEST - 2 VIEW COMPARISON:  Chest radiograph 04/02/2021; CT angio chest 09/20/2020 FINDINGS: Stable mildly enlarged cardiac silhouette. Aortic calcifications. Both lungs are clear. No pleural effusion or pneumothorax.  Multilevel degenerative changes in the mid and distal thoracic spine. Spinal stimulator overlies the epidural space at the level of the distal thoracic spine. Partially visualized anterior cervical spinal fusion. Posttraumatic deformity of the left humeral head. No acute osseous abnormality. IMPRESSION: Stable mild cardiomegaly without overt pulmonary edema. Aortic Atherosclerosis (ICD10-I70.0). Electronically Signed   By: Nadeen Landau.D.  On: 07/14/2021 12:48   ECHOCARDIOGRAM COMPLETE  Result Date: 07/15/2021    ECHOCARDIOGRAM REPORT   Patient Name:   Shirley Leblanc Date of Exam: 07/15/2021 Medical Rec #:  [id number]     Height:       65.0 in Accession #:    [id number]    Weight:       277.1 lb Date of Birth:  08-07-1945      BSA:          2.272 m Patient Age:    68 years      BP:           110/63 mmHg Patient Gender: F             HR:           88 bpm. Exam Location:  Inpatient Procedure: 2D Echo, 3D Echo, Cardiac Doppler and Color Doppler Indications:    I50.40* Unspecified combined systolic (congestive) and diastolic                 (congestive) heart failure  History:        Patient has prior history of Echocardiogram examinations, most                 recent 04/02/2021. CHF, CAD, Abnormal ECG, COPD,                 Signs/Symptoms:Edema, Shortness of Breath, Dyspnea,                 Dizziness/Lightheadedness and Syncope; Risk Factors:Diabetes.                 Opioid dependence.  Sonographer:    Roseanna Rainbow RDCS Referring Phys: [id number] JENNIFER YATES  Sonographer Comments: Technically difficult study due to poor echo windows and patient is morbidly obese. Image acquisition challenging due to patient body habitus. IMPRESSIONS  1. Left ventricular ejection fraction, by estimation, is 60 to 65%. Left ventricular ejection fraction by 3D volume is 60 %. The left ventricle has normal function. The left ventricle has no regional wall motion abnormalities. There is mild left ventricular hypertrophy. Left ventricular  diastolic parameters are indeterminate. There is the interventricular septum is flattened in systole and diastole, consistent with right ventricular pressure and volume overload.  2. Right ventricular systolic function is moderately reduced. The right ventricular size is moderately enlarged. There is moderately elevated pulmonary artery systolic pressure. The estimated right ventricular systolic pressure is 85.4 mmHg.  3. The mitral valve is degenerative. Mild mitral valve regurgitation. Mild mitral stenosis. The mean mitral valve gradient is 4.0 mmHg at 81 bpm. Moderate mitral annular calcification.  4. The tricuspid valve is abnormal. Tricuspid valve regurgitation is severe.  5. The aortic valve is tricuspid. Aortic valve regurgitation is not visualized. Aortic valve sclerosis/calcification is present, without any evidence of aortic stenosis.  6. Aortic dilatation noted. There is dilatation of the ascending aorta, measuring 41 mm.  7. The inferior vena cava is dilated in size with <50% respiratory variability, suggesting right atrial pressure of 15 mmHg. FINDINGS  Left Ventricle: Left ventricular ejection fraction, by estimation, is 60 to 65%. Left ventricular ejection fraction by 3D volume is 60 %. The left ventricle has normal function. The left ventricle has no regional wall motion abnormalities. The left ventricular internal cavity size was normal in size. There is mild left ventricular hypertrophy. The interventricular septum is flattened in systole and diastole, consistent with right ventricular pressure and volume overload. Left ventricular diastolic  parameters are indeterminate. Right Ventricle: The right ventricular size is moderately enlarged. Right vetricular wall thickness was not well visualized. Right ventricular systolic function is moderately reduced. There is moderately elevated pulmonary artery systolic pressure. The tricuspid regurgitant velocity is 3.21 m/s, and with an assumed right atrial  pressure of 15 mmHg, the estimated right ventricular systolic pressure is 57.9 mmHg. Left Atrium: Left atrial size was normal in size. Right Atrium: Right atrial size was normal in size. Pericardium: There is no evidence of pericardial effusion. Mitral Valve: The mitral valve is degenerative in appearance. Moderate mitral annular calcification. Mild mitral valve regurgitation. Mild mitral valve stenosis. MV peak gradient, 8.9 mmHg. The mean mitral valve gradient is 4.0 mmHg. Tricuspid Valve: The tricuspid valve is abnormal. Tricuspid valve regurgitation is severe. Aortic Valve: The aortic valve is tricuspid. Aortic valve regurgitation is not visualized. Aortic valve sclerosis/calcification is present, without any evidence of aortic stenosis. Pulmonic Valve: The pulmonic valve was not well visualized. Pulmonic valve regurgitation is trivial. Aorta: The aortic root is normal in size and structure and aortic dilatation noted. There is dilatation of the ascending aorta, measuring 41 mm. Venous: The inferior vena cava is dilated in size with less than 50% respiratory variability, suggesting right atrial pressure of 15 mmHg. IAS/Shunts: The interatrial septum was not well visualized.  LEFT VENTRICLE PLAX 2D LVIDd:         5.00 cm LVIDs:         3.40 cm LV PW:         1.00 cm         3D Volume EF LV IVS:        1.10 cm         LV 3D EF:    Left LVOT diam:     1.90 cm                      ventricul LV SV:         66                           ar LV SV Index:   29                           ejection LVOT Area:     2.84 cm                     fraction                                             by 3D                                             volume is LV Volumes (MOD)                            60 %. LV vol d, MOD    112.0 ml A2C: LV vol d, MOD    70.7 ml       3D Volume EF: A4C:  3D EF:        60 % LV vol s, MOD    30.7 ml       LV EDV:       140 ml A2C:                           LV ESV:       57 ml  LV vol s, MOD    29.5 ml       LV SV:        84 ml A4C: LV SV MOD A2C:   81.3 ml LV SV MOD A4C:   70.7 ml LV SV MOD BP:    58.1 ml RIGHT VENTRICLE            IVC RV S prime:     9.99 cm/s  IVC diam: 2.80 cm TAPSE (M-mode): 1.5 cm LEFT ATRIUM             Index        RIGHT ATRIUM           Index LA diam:        5.10 cm 2.24 cm/m   RA Area:     20.10 cm LA Vol (A2C):   27.4 ml 12.06 ml/m  RA Volume:   62.00 ml  27.29 ml/m LA Vol (A4C):   67.0 ml 29.49 ml/m LA Biplane Vol: 45.1 ml 19.85 ml/m  AORTIC VALVE LVOT Vmax:   127.00 cm/s LVOT Vmean:  84.900 cm/s LVOT VTI:    0.232 m  AORTA Ao Root diam: 2.80 cm Ao Asc diam:  4.10 cm MITRAL VALVE                TRICUSPID VALVE MV Area (PHT): 4.06 cm     TR Peak grad:   41.2 mmHg MV Area VTI:   2.32 cm     TR Vmax:        321.00 cm/s MV Peak grad:  8.9 mmHg MV Mean grad:  4.0 mmHg     SHUNTS MV Vmax:       1.49 m/s     Systemic VTI:  0.23 m MV Vmean:      83.6 cm/s    Systemic Diam: 1.90 cm MV Decel Time: 187 msec MR Peak grad: 66.9 mmHg MR Mean grad: 45.0 mmHg MR Vmax:      409.00 cm/s MR Vmean:     327.0 cm/s MV E velocity: 136.00 cm/s Oswaldo Milian MD Electronically signed by Oswaldo Milian MD Signature Date/Time: 07/15/2021/10:32:21 AM    Final     Lab Data:  CBC: Recent Labs  Lab 07/14/21 1132 07/15/21 0132  WBC 4.2 4.6  HGB 10.5* 10.2*  HCT 35.5* 33.9*  MCV 91.0 89.7  PLT 116* 373*   Basic Metabolic Panel: Recent Labs  Lab 07/14/21 1132 07/15/21 0132  NA 135 136  K 4.1 3.8  CL 97* 93*  CO2 30 35*  GLUCOSE 94 99  BUN 12 10  CREATININE 0.89 0.83  CALCIUM 9.2 9.2   GFR: Estimated Creatinine Clearance: 78.1 mL/min (by C-G formula based on SCr of 0.83 mg/dL). Liver Function Tests: No results for input(s): AST, ALT, ALKPHOS, BILITOT, PROT, ALBUMIN in the last 168 hours. No results for input(s): LIPASE, AMYLASE in the last 168 hours. No results for input(s): AMMONIA in the last 168 hours. Coagulation Profile: No results for  input(s): INR, PROTIME in the last 168 hours. Cardiac  Enzymes: No results for input(s): CKTOTAL, CKMB, CKMBINDEX, TROPONINI in the last 168 hours. BNP (last 3 results) No results for input(s): PROBNP in the last 8760 hours. HbA1C: No results for input(s): HGBA1C in the last 72 hours. CBG: No results for input(s): GLUCAP in the last 168 hours. Lipid Profile: No results for input(s): CHOL, HDL, LDLCALC, TRIG, CHOLHDL, LDLDIRECT in the last 72 hours. Thyroid Function Tests: No results for input(s): TSH, T4TOTAL, FREET4, T3FREE, THYROIDAB in the last 72 hours. Anemia Panel: No results for input(s): VITAMINB12, FOLATE, FERRITIN, TIBC, IRON, RETICCTPCT in the last 72 hours. Urine analysis:    Component Value Date/Time   COLORURINE YELLOW 09/20/2020 2220   APPEARANCEUR CLEAR 09/20/2020 2220   APPEARANCEUR Clear 09/18/2016 1627   LABSPEC 1.011 09/20/2020 2220   PHURINE 7.0 09/20/2020 2220   GLUCOSEU NEGATIVE 09/20/2020 2220   HGBUR NEGATIVE 09/20/2020 2220   BILIRUBINUR NEGATIVE 09/20/2020 2220   BILIRUBINUR Negative 09/18/2016 1627   KETONESUR NEGATIVE 09/20/2020 2220   PROTEINUR NEGATIVE 09/20/2020 2220   UROBILINOGEN 0.2 05/09/2015 1033   NITRITE NEGATIVE 09/20/2020 [place] 09/20/2020 2220     Retal Tonkinson M.D. [facility] 07/15/2021, 11:51 AM  Available via Epic secure chat 7am-7pm After 7 pm, please refer to night coverage provider listed on amion.

## 2021-07-15 NOTE — Progress Notes (Signed)
Initial Nutrition Assessment  DOCUMENTATION CODES:   Obesity unspecified  INTERVENTION:  - Continue 30 ml ProSource Plus BID, each supplement provides 100 kcals and 15 grams protein.   - Encourage PO intake  - Spoke with pt about importance of low sodium foods to aid in fluid retention  NUTRITION DIAGNOSIS:   Increased nutrient needs related to chronic illness (acute on chronic CHF) as evidenced by estimated needs.  GOAL:   Patient will meet greater than or equal to 90% of their needs  MONITOR:   PO intake, Supplement acceptance, Labs, Weight trends  REASON FOR ASSESSMENT:   Consult Other (Comment) (nutrition goals)  ASSESSMENT:   Pt admitted from ALF with SOB secondary to acute on chronic CHF. Pt had previous hospital admission 9/6-9/12 with CHF exacerbation. PMH includes CAD, CHF, COPD, HTN, HLD, and obesity.  Pt with granddaughter at bedside. She reports eating well. She has been eating about 2 meals per day at her ALF but also reports sharing some of her meals with another resident. Pt denies any changes in appetite or PO intake. During last admission she was receiving ProSource and was taking it well when mixed with another drink and is agreeable to continue receiving during this admission. Noted meal completions of 100%.  Pt with significant fluid and states that she has been unable to walk for about 2 weeks. She endorses weight loss but unsure how much is d/t to fluid fluctuations versus actual dry weight loss.   Current weight: 125.7 kg  Medications: colace, lasix, synthroid, protonix, miralax, psyllium, senna  Labs: reviewed  UOP: 3668mL x24 hours I/O's: -2930mL since admission  NUTRITION - FOCUSED PHYSICAL EXAM:  Flowsheet Row Most Recent Value  Orbital Region No depletion  Upper Arm Region No depletion  Thoracic and Lumbar Region No depletion  Buccal Region No depletion  Temple Region Mild depletion  Clavicle Bone Region No depletion  Clavicle and  Acromion Bone Region No depletion  Scapular Bone Region No depletion  Dorsal Hand No depletion  Patellar Region Unable to assess  [pitting edema BLE]  Anterior Thigh Region Unable to assess  Posterior Calf Region Unable to assess  [pitting edema BLE]  Edema (RD Assessment) Moderate  Hair Reviewed  Eyes Reviewed  Mouth Reviewed  Skin Reviewed  Nails Reviewed      Diet Order:   Diet Order             Diet Heart Room service appropriate? Yes; Fluid consistency: Thin; Fluid restriction: 1500 mL Fluid  Diet effective now                   EDUCATION NEEDS:   Education needs have been addressed  Skin:  Skin Assessment: Reviewed RN Assessment  Last BM:  PTA  Height:   Ht Readings from Last 1 Encounters:  04/02/21 5\' 5"  (1.651 m)    Weight:   Wt Readings from Last 1 Encounters:  07/15/21 125.7 kg    Ideal Body Weight:  56.8 kg  BMI:  Body mass index is 46.11 kg/m.  Estimated Nutritional Needs:   Kcal:  1650-1850  Protein:  80-90g  Fluid:  >/=1.6L  Clayborne Dana, RDN, LDN Clinical Nutrition

## 2021-07-15 NOTE — Progress Notes (Signed)
°  Echocardiogram 2D Echocardiogram has been performed.  Bobbye Charleston 07/15/2021, 8:57 AM

## 2021-07-15 NOTE — TOC Progression Note (Addendum)
Transition of Care [facility]) - Progression Note    Patient Details  Name: Shirley Leblanc MRN: [id number] Date of Birth: 02/02/1946  Transition of Care [facility]) CM/SW Contact  Reece Agar, [place] Phone Number: 07/15/2021, 9:43 AM  Clinical Narrative:     Transition of Care [facility]) Screening Note   Patient Details  Name: Shirley Leblanc Date of Birth: [birth year]   Transition of Care [facility]) CM/SW Contact:    Tresa Endo Phone Number: 07/15/2021, 9:43 AM    Pt is from [place] and her emergency contact is her Granddaughter Allyson Sabal [phone]). PT has not seen pt for recommendation, CSW following.          Expected Discharge Plan and Services                                                 Social Determinants of Health (SDOH) Interventions    Readmission Risk Interventions No flowsheet data found.

## 2021-07-15 NOTE — Evaluation (Signed)
Occupational Therapy Evaluation Patient Details Name: Shirley Leblanc MRN: [id number] DOB: 03/17/1946 Today's Date: 07/15/2021   History of Present Illness Shirley Leblanc is a 75 y.o. female with medical history significant of CAD; diastolic CHF; COPD; HTN; HLD; and obesity presenting with SOB.   She was previously admitted from 9/6-12 with acute on chronic diastolic CHF and was discharged back to ALF with hospice.  She is increasingly non-ambulatory and has worsening edema.   Clinical Impression   Pt was primarily reliant on w/c for getting to dining room at ALF. She was assisted for bathing and dressing, but could groom and self feed. Pt presents with generalized weakness, impaired cognition (likely baseline), decreased activity tolerance and impaired standing balance. She requires min assist for bed level and OOB mobility to chair with RW and set up to total assist for ADL. Family states pt needs a higher level of care than that of ALF, recommending SNF and resumption of hospice services.      Recommendations for follow up therapy are one component of a multi-disciplinary discharge planning process, led by the attending physician.  Recommendations may be updated based on patient status, additional functional criteria and insurance authorization.   Follow Up Recommendations  Skilled nursing-short term rehab (<3 hours/day) Hospice care   Assistance Recommended at Discharge Frequent or constant Supervision/Assistance  Functional Status Assessment  Patient has had a recent decline in their functional status and/or demonstrates limited ability to make significant improvements in function in a reasonable and predictable amount of time  Equipment Recommendations  None recommended by OT    Recommendations for Other Services       Precautions / Restrictions Precautions Precautions: Fall      Mobility Bed Mobility Overal bed mobility: Needs Assistance Bed Mobility: Supine to Sit     Supine  to sit: HOB elevated;Min assist     General bed mobility comments: increased time    Transfers Overall transfer level: Needs assistance Equipment used: Rolling walker (2 wheels) Transfers: Sit to/from Stand;Bed to chair/wheelchair/BSC Sit to Stand: Min assist;From elevated surface Stand pivot transfers: Min assist         General transfer comment: assist for balance and safety stand step to recliner with RW, cues for positioning      Balance Overall balance assessment: Needs assistance Sitting-balance support: Feet supported Sitting balance-Leahy Scale: Fair     Standing balance support: Bilateral upper extremity supported Standing balance-Leahy Scale: Poor Standing balance comment: reliant on B UEs                           ADL either performed or assessed with clinical judgement   ADL Overall ADL's : Needs assistance/impaired Eating/Feeding: Set up;Sitting   Grooming: Brushing hair;Oral care;Sitting;Set up   Upper Body Bathing: Moderate assistance;Sitting   Lower Body Bathing: Total assistance;Sit to/from stand   Upper Body Dressing : Minimal assistance;Sitting   Lower Body Dressing: Total assistance;Bed level   Toilet Transfer: Minimal assistance;Stand-pivot;BSC/3in1;Rolling walker (2 wheels)   Toileting- Clothing Manipulation and Hygiene: Total assistance;Sit to/from stand               Vision Ability to See in Adequate Light: 0 Adequate Patient Visual Report: No change from baseline       Perception     Praxis      Pertinent Vitals/Pain Pain Assessment: Faces Faces Pain Scale: Hurts little more Pain Location: L shoulder with elevation Pain Descriptors / Indicators:  Grimacing;Guarding Pain Intervention(s): Monitored during session     Hand Dominance Right   Extremity/Trunk Assessment Upper Extremity Assessment Upper Extremity Assessment: Generalized weakness;LUE deficits/detail LUE Deficits / Details: longstanding shoulder  limitations from prior fx LUE: Shoulder pain with ROM LUE Coordination: decreased gross motor   Lower Extremity Assessment Lower Extremity Assessment: Defer to PT evaluation   Cervical / Trunk Assessment Cervical / Trunk Assessment: Other exceptions (obesity)   Communication Communication Communication: HOH   Cognition Arousal/Alertness: Awake/alert Behavior During Therapy: WFL for tasks assessed/performed Overall Cognitive Status: Impaired/Different from baseline Area of Impairment: Memory;Following commands;Safety/judgement;Awareness;Problem solving;Attention                   Current Attention Level: Sustained Memory: Decreased short-term memory Following Commands: Follows one step commands with increased time Safety/Judgement: Decreased awareness of safety;Decreased awareness of deficits Awareness: Emergent Problem Solving: Slow processing;Decreased initiation;Difficulty sequencing;Requires verbal cues       General Comments  grandaughter present and helping to encourage pt for OOB    Exercises     Shoulder Instructions      Home Living Family/patient expects to be discharged to:: Skilled nursing facility                                 Additional Comments: per granddaughter, family thinks pt needs a higher level of care, pt prefers to go back to ALF      Prior Functioning/Environment Prior Level of Function : Needs assist             Mobility Comments: uses motorized w/c to get to dining room ADLs Comments: can self feed and grooming, assisted for bathing, dressing and toileting        OT Problem List: Impaired balance (sitting and/or standing);Decreased cognition;Decreased knowledge of use of DME or AE;Obesity      OT Treatment/Interventions: Self-care/ADL training;DME and/or AE instruction;Patient/family education;Balance training    OT Goals(Current goals can be found in the care plan section) Acute Rehab OT Goals OT Goal  Formulation: With patient/family Time For Goal Achievement: 08/05/21 Potential to Achieve Goals: Fair ADL Goals Pt Will Perform Upper Body Bathing: with min assist;sitting Pt Will Perform Upper Body Dressing: with min assist;sitting Pt Will Transfer to Toilet: with supervision;stand pivot transfer;bedside commode Additional ADL Goal #1: Pt will perform bed mobility with supervision using feature of hospital bed in preparation for ADL.  OT Frequency: Min 2X/week   Barriers to D/C:            Co-evaluation PT/OT/SLP Co-Evaluation/Treatment: Yes Reason for Co-Treatment: To address functional/ADL transfers PT goals addressed during session: Mobility/safety with mobility;Balance OT goals addressed during session: ADL's and self-care      AM-PAC OT "6 Clicks" Daily Activity     Outcome Measure Help from another person eating meals?: A Little Help from another person taking care of personal grooming?: A Little Help from another person toileting, which includes using toliet, bedpan, or urinal?: A Lot Help from another person bathing (including washing, rinsing, drying)?: A Lot Help from another person to put on and taking off regular upper body clothing?: A Little Help from another person to put on and taking off regular lower body clothing?: Total 6 Click Score: 14   End of Session Equipment Utilized During Treatment: Rolling walker (2 wheels);Gait belt;Oxygen (3L) Nurse Communication: Mobility status  Activity Tolerance: Patient tolerated treatment well Patient left: in chair;with call bell/phone within reach;with chair  alarm set;with family/visitor present  OT Visit Diagnosis: Unsteadiness on feet (R26.81);Other abnormalities of gait and mobility (R26.89);Muscle weakness (generalized) (M62.81);Other symptoms and signs involving cognitive function                Time: 8921-1941 OT Time Calculation (min): 26 min Charges:  OT General Charges $OT Visit: 1 Visit OT Evaluation $OT  Eval Moderate Complexity: 1 Mod  Nestor Lewandowsky, OTR/L Acute Rehabilitation Services Pager: [phone] Office: [phone]   Malka So 07/15/2021, 1:51 PM

## 2021-07-16 ENCOUNTER — Encounter (HOSPITAL_COMMUNITY)

## 2021-07-16 DIAGNOSIS — I5021 Acute systolic (congestive) heart failure: Secondary | ICD-10-CM

## 2021-07-16 LAB — BASIC METABOLIC PANEL
Anion gap: 5 (ref 5–15)
BUN: 14 mg/dL (ref 8–23)
CO2: 38 mmol/L — ABNORMAL HIGH (ref 22–32)
Calcium: 8.9 mg/dL (ref 8.9–10.3)
Chloride: 94 mmol/L — ABNORMAL LOW (ref 98–111)
Creatinine, Ser: 0.86 mg/dL (ref 0.44–1.00)
GFR, Estimated: >60 mL/min (ref >60)
Glucose, Bld: 107 mg/dL — ABNORMAL HIGH (ref 70–99)
Potassium: 4.1 mmol/L (ref 3.5–5.1)
Sodium: 137 mmol/L (ref 135–145)

## 2021-07-16 LAB — HEMOGLOBIN A1C
Hgb A1c MFr Bld: 5.9 % — ABNORMAL HIGH (ref 4.8–5.6)
Mean Plasma Glucose: 122.63 mg/dL

## 2021-07-16 LAB — TSH: TSH: 2.909 u[IU]/mL (ref 0.350–4.500)

## 2021-07-16 MED ORDER — CLOTRIMAZOLE 1 % VA CREA
1.0000 | TOPICAL_CREAM | Freq: Every day | VAGINAL | Status: DC
  Administered 2021-07-16 – 2021-07-17 (×2): 1 via VAGINAL
  Filled 2021-07-16 (×2): qty 45

## 2021-07-16 MED ORDER — CLOTRIMAZOLE 1 % EX CREA: TOPICAL_CREAM | Freq: Two times a day (BID) | CUTANEOUS | Status: DC

## 2021-07-16 MED ORDER — FLUCONAZOLE 150 MG PO TABS
150.0000 mg | ORAL_TABLET | Freq: Once | ORAL | Status: AC
  Administered 2021-07-16: 18:00:00 150 mg via ORAL
  Filled 2021-07-16: qty 1

## 2021-07-16 MED ORDER — FUROSEMIDE 10 MG/ML IJ SOLN
40.0000 mg | Freq: Every day | INTRAMUSCULAR | Status: DC
  Administered 2021-07-16 – 2021-07-17 (×2): 40 mg via INTRAVENOUS
  Filled 2021-07-16 (×2): qty 4

## 2021-07-16 NOTE — Progress Notes (Signed)
[facility]                                                                              Patient Demographics  Shirley Leblanc, is a 75 y.o. female, DOB - 04-Oct-1945, ZMO:[id number]  Admit date - 07/14/2021   Admitting Physician Karmen Bongo, MD  Outpatient Primary MD for the patient is Reymundo Poll, MD  Outpatient specialists:   LOS - 1  days   Medical records reviewed and are as summarized below:    Chief Complaint  Patient presents with   Chest Pain   Shortness of Breath   Edema       Brief summary   Patient is a 75 year old female with history of CAD, diastolic CHF, COPD, hypertension, hyperlipidemia presented with shortness of breath.  Patient was previously admitted from 9/6-9/12 with acute on chronic diastolic CHF and was discharged to ALF with hospice.  Patient and daughter voiced concerns about the facility's ability to care for her.  She is increasingly nonambulatory and has been having worsening edema.  Patient reported dyspnea on exertion and is on her usual home O2. Patient was found to be in volume overload, mild pitting edema/anasarca.  She has been taking torsemide daily.  She was given Lasix 40 mg IV x1   Assessment & Plan    Principal Problem:   Acute on chronic diastolic CHF (congestive heart failure) ([place]), severe TR -Chest x-ray without overt pulmonary edema, however presented with anasarca, volume overload, pitting edema.  BNP 215 -2D echo showed EF of 60 to 65%, mild LVH, no R WMA, interventricular septum is flattened in systole and diastole consistent with right ventricular pressure and volume overload.  Right ventricular systolic function moderately reduced.  Severe TR, mild MR, dilatation of the ascending aorta measuring 41 mm -Received IV Lasix in ED, then placed on 40 mg  IV BID, negative balance of 4.5 L, -BP soft, will decrease Lasix to 40 mg IV daily -Continue Coreg, low-dose 3.125 mg twice daily, hold lisinopril due to borderline  BP.  Active Problems:   Hypertension associated with diabetes ([place]) -Borderline BP, continue Coreg, Lasix, hold lisinopril    Atrial fibrillation (HCC) -Heart rate controlled, continue Coreg - not on anticoagulation, continue 81 mg aspirin    Hypothyroidism -Continue Synthroid, TSH 2.9    Hyperlipidemia associated with type 2 diabetes mellitus (HCC) -Continue Lipitor    Depression -Continue BuSpar, Ativan, Zoloft    Diabetes mellitus type II, ([place]) not on any medications -Hemoglobin A1c 5.9 on 12/20  -CBGs are     Chronic back pain, osteoarthritis of the knees -Patient very concerned about her pain medications, does not want to be on home morphine oral.  She states that oxycodone works better.   -Continue Percocet per her schedule, a.m., lunch, evening and bedtime, oxycodone as needed for breakthrough pain -Currently doing well, states with current regimen of pain medications is working well and does not want it to be altered at the time of discharge.    Chronic obstructive pulmonary disease (HCC) Currently stable, no wheezing  Constipation -Continue MiraLAX, psyllium, senna  Stasis dermatitis -Continue triamcinolone cream -Unna boots  once peripheral edema is improved  Obesity Estimated body mass index is 46.74 kg/m as calculated from the following:   Height as of 04/02/21: 5\' 5"  (1.651 m).   Weight as of this encounter: 127.4 kg. -PT OT evaluation, may need higher level of care, was an ALF with hospice  Code Status: DNR DVT Prophylaxis:  enoxaparin (LOVENOX) injection 40 mg Start: 07/14/21 1515   Level of Care: Level of care: Telemetry Cardiac Family Communication: Discussed all imaging results, lab results, explained to the patient and grand daughter, Lamar Benes on the phone Family prefers her not to go back to assisted living facility as she is requiring more care than provided by ALF.  TOC aware.   Disposition Plan:     Status is: Inpatient.   Time Spent  in minutes 25 minutes Procedures:  2D echo  Consultants:   None  Antimicrobials:   Anti-infectives (From admission, onward)    None          Medications  Scheduled Meds:  (feeding supplement) PROSource Plus  30 mL Oral BID BM   aspirin EC  81 mg Oral Daily   atorvastatin  10 mg Oral Daily   busPIRone  20 mg Oral TID   carvedilol  3.125 mg Oral BID WC   docusate sodium  100 mg Oral BID   enoxaparin (LOVENOX) injection  40 mg Subcutaneous Q24H   furosemide  40 mg Intravenous Daily   levothyroxine  88 mcg Oral Q0600   LORazepam  0.5 mg Oral BID   oxyCODONE-acetaminophen  1 tablet Oral 4 times per day   And   oxyCODONE  5 mg Oral 4 times per day   pantoprazole  40 mg Oral QPM   polyethylene glycol  17 g Oral Daily   psyllium  1 packet Oral QHS   senna  2 tablet Oral QHS   sertraline  200 mg Oral Daily   sodium chloride flush  3 mL Intravenous Q12H   tamsulosin  0.4 mg Oral QHS   triamcinolone 0.1 % cream : eucerin   Topical BID   Continuous Infusions: PRN Meds:.acetaminophen **OR** acetaminophen, albuterol, bisacodyl, hydrALAZINE, ondansetron **OR** ondansetron (ZOFRAN) IV, oxyCODONE, polyethylene glycol, traZODone      Subjective:   Shirley Leblanc was seen and examined today.  No complaints, states pain medication is still working better now.  No acute chest pain, dizziness nausea vomiting or abdominal pain.   Objective:   Vitals:   07/15/21 1953 07/16/21 0403 07/16/21 0833 07/16/21 1154  BP: (!) 108/49 (!) 96/50 (!) 97/50 95/68  Pulse: 81 82  82  Resp: 18 19  17   Temp: 98 F (36.7 C) 97.6 F (36.4 C)  98 F (36.7 C)  TempSrc: Oral Oral  Oral  SpO2: 96% 90%    Weight:  127.4 kg      Intake/Output Summary (Last 24 hours) at 07/16/2021 1248 Last data filed at 07/16/2021 1156 Gross per 24 hour  Intake 520 ml  Output 1450 ml  Net -930 ml     Wt Readings from Last 3 Encounters:  07/16/21 127.4 kg  04/08/21 116.9 kg  12/25/20 129.3 kg    Physical Exam General: Alert and oriented x 3, NAD Cardiovascular: S1 S2 clear, RRR.  Respiratory: Diminished breath sound at the bases Gastrointestinal: Soft, nontender, nondistended, NBS Ext: 2+ pedal edema bilaterally Neuro: no new FND's   Data Reviewed:  I have personally reviewed following labs and imaging studies  Micro Results Recent Results (  from the past 240 hour(s))  Resp Panel by RT-PCR (Flu A&B, Covid) Nasopharyngeal Swab     Status: None   Collection Time: 07/14/21  1:08 PM   Specimen: Nasopharyngeal Swab; Nasopharyngeal(NP) swabs in vial transport medium  Result Value Ref Range Status   SARS Coronavirus 2 by RT PCR NEGATIVE NEGATIVE Final    Comment: (NOTE) SARS-CoV-2 target nucleic acids are NOT DETECTED.  The SARS-CoV-2 RNA is generally detectable in upper respiratory specimens during the acute phase of infection. The lowest concentration of SARS-CoV-2 viral copies this assay can detect is 138 copies/mL. A negative result does not preclude SARS-Cov-2 infection and should not be used as the sole basis for treatment or other patient management decisions. A negative result may occur with  improper specimen collection/handling, submission of specimen other than nasopharyngeal swab, presence of viral mutation(s) within the areas targeted by this assay, and inadequate number of viral copies(<138 copies/mL). A negative result must be combined with clinical observations, patient history, and epidemiological information. The expected result is Negative.  Fact Sheet for Patients:  EntrepreneurPulse.com.au  Fact Sheet for Healthcare Providers:  IncredibleEmployment.be  This test is no t yet approved or cleared by the [place] FDA and  has been authorized for detection and/or diagnosis of SARS-CoV-2 by FDA under an Emergency Use Authorization (EUA). This EUA will remain  in effect (meaning this test can be used) for the  duration of the COVID-19 declaration under Section 564(b)(1) of the Act, 21 U.S.C.section 360bbb-3(b)(1), unless the authorization is terminated  or revoked sooner.       Influenza A by PCR NEGATIVE NEGATIVE Final   Influenza B by PCR NEGATIVE NEGATIVE Final    Comment: (NOTE) The Xpert Xpress SARS-CoV-2/FLU/RSV plus assay is intended as an aid in the diagnosis of influenza from Nasopharyngeal swab specimens and should not be used as a sole basis for treatment. Nasal washings and aspirates are unacceptable for Xpert Xpress SARS-CoV-2/FLU/RSV testing.  Fact Sheet for Patients: EntrepreneurPulse.com.au  Fact Sheet for Healthcare Providers: IncredibleEmployment.be  This test is not yet approved or cleared by the [place] FDA and has been authorized for detection and/or diagnosis of SARS-CoV-2 by FDA under an Emergency Use Authorization (EUA). This EUA will remain in effect (meaning this test can be used) for the duration of the COVID-19 declaration under Section 564(b)(1) of the Act, 21 U.S.C. section 360bbb-3(b)(1), unless the authorization is terminated or revoked.  Performed at [facility], [place] [address], [place], [place] [address]     Radiology Reports DG Chest 2 View  Result Date: 07/14/2021 CLINICAL DATA:  Chest pain. Patient complains of fluid retention. Shortness of breath with exertion. EXAM: CHEST - 2 VIEW COMPARISON:  Chest radiograph 04/02/2021; CT angio chest 09/20/2020 FINDINGS: Stable mildly enlarged cardiac silhouette. Aortic calcifications. Both lungs are clear. No pleural effusion or pneumothorax. Multilevel degenerative changes in the mid and distal thoracic spine. Spinal stimulator overlies the epidural space at the level of the distal thoracic spine. Partially visualized anterior cervical spinal fusion. Posttraumatic deformity of the left humeral head. No acute osseous abnormality. IMPRESSION: Stable mild  cardiomegaly without overt pulmonary edema. Aortic Atherosclerosis (ICD10-I70.0). Electronically Signed   By: Ileana Roup M.D.   On: 07/14/2021 12:48   ECHOCARDIOGRAM COMPLETE  Result Date: 07/15/2021    ECHOCARDIOGRAM REPORT   Patient Name:   BREXLEE HEBERLEIN Gramlich Date of Exam: 07/15/2021 Medical Rec #:  [id number]     Height:       65.0  in Accession #:    [id number]    Weight:       277.1 lb Date of Birth:  09/13/1945      BSA:          2.272 m Patient Age:    51 years      BP:           110/63 mmHg Patient Gender: F             HR:           88 bpm. Exam Location:  Inpatient Procedure: 2D Echo, 3D Echo, Cardiac Doppler and Color Doppler Indications:    I50.40* Unspecified combined systolic (congestive) and diastolic                 (congestive) heart failure  History:        Patient has prior history of Echocardiogram examinations, most                 recent 04/02/2021. CHF, CAD, Abnormal ECG, COPD,                 Signs/Symptoms:Edema, Shortness of Breath, Dyspnea,                 Dizziness/Lightheadedness and Syncope; Risk Factors:Diabetes.                 Opioid dependence.  Sonographer:    Roseanna Rainbow RDCS Referring Phys: [id number] JENNIFER YATES  Sonographer Comments: Technically difficult study due to poor echo windows and patient is morbidly obese. Image acquisition challenging due to patient body habitus. IMPRESSIONS  1. Left ventricular ejection fraction, by estimation, is 60 to 65%. Left ventricular ejection fraction by 3D volume is 60 %. The left ventricle has normal function. The left ventricle has no regional wall motion abnormalities. There is mild left ventricular hypertrophy. Left ventricular diastolic parameters are indeterminate. There is the interventricular septum is flattened in systole and diastole, consistent with right ventricular pressure and volume overload.  2. Right ventricular systolic function is moderately reduced. The right ventricular size is moderately enlarged. There is moderately elevated  pulmonary artery systolic pressure. The estimated right ventricular systolic pressure is 38.7 mmHg.  3. The mitral valve is degenerative. Mild mitral valve regurgitation. Mild mitral stenosis. The mean mitral valve gradient is 4.0 mmHg at 81 bpm. Moderate mitral annular calcification.  4. The tricuspid valve is abnormal. Tricuspid valve regurgitation is severe.  5. The aortic valve is tricuspid. Aortic valve regurgitation is not visualized. Aortic valve sclerosis/calcification is present, without any evidence of aortic stenosis.  6. Aortic dilatation noted. There is dilatation of the ascending aorta, measuring 41 mm.  7. The inferior vena cava is dilated in size with <50% respiratory variability, suggesting right atrial pressure of 15 mmHg. FINDINGS  Left Ventricle: Left ventricular ejection fraction, by estimation, is 60 to 65%. Left ventricular ejection fraction by 3D volume is 60 %. The left ventricle has normal function. The left ventricle has no regional wall motion abnormalities. The left ventricular internal cavity size was normal in size. There is mild left ventricular hypertrophy. The interventricular septum is flattened in systole and diastole, consistent with right ventricular pressure and volume overload. Left ventricular diastolic parameters are indeterminate. Right Ventricle: The right ventricular size is moderately enlarged. Right vetricular wall thickness was not well visualized. Right ventricular systolic function is moderately reduced. There is moderately elevated pulmonary artery systolic pressure. The tricuspid regurgitant velocity is 3.21 m/s, and with an assumed  right atrial pressure of 15 mmHg, the estimated right ventricular systolic pressure is 26.3 mmHg. Left Atrium: Left atrial size was normal in size. Right Atrium: Right atrial size was normal in size. Pericardium: There is no evidence of pericardial effusion. Mitral Valve: The mitral valve is degenerative in appearance. Moderate mitral  annular calcification. Mild mitral valve regurgitation. Mild mitral valve stenosis. MV peak gradient, 8.9 mmHg. The mean mitral valve gradient is 4.0 mmHg. Tricuspid Valve: The tricuspid valve is abnormal. Tricuspid valve regurgitation is severe. Aortic Valve: The aortic valve is tricuspid. Aortic valve regurgitation is not visualized. Aortic valve sclerosis/calcification is present, without any evidence of aortic stenosis. Pulmonic Valve: The pulmonic valve was not well visualized. Pulmonic valve regurgitation is trivial. Aorta: The aortic root is normal in size and structure and aortic dilatation noted. There is dilatation of the ascending aorta, measuring 41 mm. Venous: The inferior vena cava is dilated in size with less than 50% respiratory variability, suggesting right atrial pressure of 15 mmHg. IAS/Shunts: The interatrial septum was not well visualized.  LEFT VENTRICLE PLAX 2D LVIDd:         5.00 cm LVIDs:         3.40 cm LV PW:         1.00 cm         3D Volume EF LV IVS:        1.10 cm         LV 3D EF:    Left LVOT diam:     1.90 cm                      ventricul LV SV:         66                           ar LV SV Index:   29                           ejection LVOT Area:     2.84 cm                     fraction                                             by 3D                                             volume is LV Volumes (MOD)                            60 %. LV vol d, MOD    112.0 ml A2C: LV vol d, MOD    70.7 ml       3D Volume EF: A4C:                           3D EF:        60 % LV vol s, MOD    30.7 ml       LV EDV:       140  ml A2C:                           LV ESV:       57 ml LV vol s, MOD    29.5 ml       LV SV:        84 ml A4C: LV SV MOD A2C:   81.3 ml LV SV MOD A4C:   70.7 ml LV SV MOD BP:    58.1 ml RIGHT VENTRICLE            IVC RV S prime:     9.99 cm/s  IVC diam: 2.80 cm TAPSE (M-mode): 1.5 cm LEFT ATRIUM             Index        RIGHT ATRIUM           Index LA diam:        5.10 cm  2.24 cm/m   RA Area:     20.10 cm LA Vol (A2C):   27.4 ml 12.06 ml/m  RA Volume:   62.00 ml  27.29 ml/m LA Vol (A4C):   67.0 ml 29.49 ml/m LA Biplane Vol: 45.1 ml 19.85 ml/m  AORTIC VALVE LVOT Vmax:   127.00 cm/s LVOT Vmean:  84.900 cm/s LVOT VTI:    0.232 m  AORTA Ao Root diam: 2.80 cm Ao Asc diam:  4.10 cm MITRAL VALVE                TRICUSPID VALVE MV Area (PHT): 4.06 cm     TR Peak grad:   41.2 mmHg MV Area VTI:   2.32 cm     TR Vmax:        321.00 cm/s MV Peak grad:  8.9 mmHg MV Mean grad:  4.0 mmHg     SHUNTS MV Vmax:       1.49 m/s     Systemic VTI:  0.23 m MV Vmean:      83.6 cm/s    Systemic Diam: 1.90 cm MV Decel Time: 187 msec MR Peak grad: 66.9 mmHg MR Mean grad: 45.0 mmHg MR Vmax:      409.00 cm/s MR Vmean:     327.0 cm/s MV E velocity: 136.00 cm/s Oswaldo Milian MD Electronically signed by Oswaldo Milian MD Signature Date/Time: 07/15/2021/10:32:21 AM    Final     Lab Data:  CBC: Recent Labs  Lab 07/14/21 1132 07/15/21 0132  WBC 4.2 4.6  HGB 10.5* 10.2*  HCT 35.5* 33.9*  MCV 91.0 89.7  PLT 116* 503*   Basic Metabolic Panel: Recent Labs  Lab 07/14/21 1132 07/15/21 0132 07/16/21 0140  NA 135 136 137  K 4.1 3.8 4.1  CL 97* 93* 94*  CO2 30 35* 38*  GLUCOSE 94 99 107*  BUN 12 10 14   CREATININE 0.89 0.83 0.86  CALCIUM 9.2 9.2 8.9   GFR: Estimated Creatinine Clearance: 76 mL/min (by C-G formula based on SCr of 0.86 mg/dL). Liver Function Tests: No results for input(s): AST, ALT, ALKPHOS, BILITOT, PROT, ALBUMIN in the last 168 hours. No results for input(s): LIPASE, AMYLASE in the last 168 hours. No results for input(s): AMMONIA in the last 168 hours. Coagulation Profile: No results for input(s): INR, PROTIME in the last 168 hours. Cardiac Enzymes: No results for input(s): CKTOTAL, CKMB, CKMBINDEX, TROPONINI in the last 168 hours. BNP (last 3 results) No results for input(s): PROBNP in the last  8760 hours. HbA1C: Recent Labs    07/16/21 0140   HGBA1C 5.9*   CBG: No results for input(s): GLUCAP in the last 168 hours. Lipid Profile: No results for input(s): CHOL, HDL, LDLCALC, TRIG, CHOLHDL, LDLDIRECT in the last 72 hours. Thyroid Function Tests: Recent Labs    07/16/21 0140  TSH 2.909   Anemia Panel: No results for input(s): VITAMINB12, FOLATE, FERRITIN, TIBC, IRON, RETICCTPCT in the last 72 hours. Urine analysis:    Component Value Date/Time   COLORURINE YELLOW 09/20/2020 2220   APPEARANCEUR CLEAR 09/20/2020 2220   APPEARANCEUR Clear 09/18/2016 1627   LABSPEC 1.011 09/20/2020 2220   PHURINE 7.0 09/20/2020 2220   GLUCOSEU NEGATIVE 09/20/2020 2220   HGBUR NEGATIVE 09/20/2020 2220   BILIRUBINUR NEGATIVE 09/20/2020 2220   BILIRUBINUR Negative 09/18/2016 1627   KETONESUR NEGATIVE 09/20/2020 2220   PROTEINUR NEGATIVE 09/20/2020 2220   UROBILINOGEN 0.2 05/09/2015 1033   NITRITE NEGATIVE 09/20/2020 [place] 09/20/2020 2220     Inda Mcglothen M.D. [facility] 07/16/2021, 12:48 PM  Available via Epic secure chat 7am-7pm After 7 pm, please refer to night coverage provider listed on amion.

## 2021-07-16 NOTE — Progress Notes (Signed)
Heart Failure Navigator Progress Note  Assessed for Heart & Vascular TOC clinic readiness.  Patient does not meet criteria due to prior to hospitalization pt active with [facility] living at ALF.   Navigator available for reassessment of patient or educational resources.   Pricilla Holm, MSN, RN Heart Failure Nurse Navigator [phone]

## 2021-07-16 NOTE — Progress Notes (Addendum)
[place] 3E[facility] hospitalized hospice patient note  [name] is a current [facility] hospice patient with a terminal diagnosis of hypertensive heart disease. Shirley Leblanc has been having worsening swelling and weakness over the last few days and began having chest pain along over the past 2 days. She was out to a family function and walked more than normal and said that this exacerbated her symptoms. Per request of the patient facility activated EMS and patient has been admitted to the hospital. She was admitted on 12.18 with a diagnosis of congestive heart failure. Per Dr. Gildardo Cranker with [facility] this is a related hospice admission.   Visited patient, not family present. Patient as sitting up in bed, eating. No apparent distress. Patient states she would like to return to [place] where she has lived for the past 3 years. She states she does not want to go to rehab at [facility].    Currently remains appropriate for inpatient for symptom management and coordination of SNF Placement for rehab.   VS: 98, 95/68, 82, 17, 90% 3lnc I/O: 920/1500  Abnormal labs: 07/16/21 01:40 Chloride: 94 (L) CO2: 38 (H) Glucose: 107 (H) 07/15/21 01:32 RBC: 3.78 (L) Hemoglobin: 10.2 (L) HCT: 33.9 (L) RDW: 16.8 (H) Platelets: 123 (L)  No new diagnostics today.  IV/PRN Medications: lasix 40mg  QD  Problem List:  Acute on chronic diastolic CHF (congestive heart failure) (HCC), severe TR -Chest x-ray without overt pulmonary edema, however presented with anasarca, volume overload, pitting edema.  BNP 215 -2D echo showed EF of 60 to 65%, mild LVH, no R WMA, interventricular septum is flattened in systole and diastole consistent with right ventricular pressure and volume overload.  Right ventricular systolic function moderately reduced.  Severe TR, mild MR, dilatation of the ascending aorta measuring 41 mm -Received IV Lasix in ED, then placed on 40 mg  IV BID, negative balance of 4.5 L, -BP soft,  will decrease Lasix to 40 mg IV daily -Continue Coreg, low-dose 3.125 mg twice daily, hold lisinopril due to borderline BP.   Active Problems:  Hypertension associated with diabetes ([place]) -Borderline BP, continue Coreg, Lasix, hold lisinopril   Atrial fibrillation (HCC) -Heart rate controlled, continue Coreg - not on anticoagulation, continue 81 mg aspirin   Hypothyroidism -Continue Synthroid, TSH 2.9    Hyperlipidemia associated with type 2 diabetes mellitus (HCC) -Continue Lipitor   Depression -Continue BuSpar, Ativan, Zoloft   Diabetes mellitus type II, ([place]) not on any medications -Hemoglobin A1c 5.9 on 12/20  -CBGs are   Chronic back pain, osteoarthritis of the knees -Patient very concerned about her pain medications, does not want to be on home morphine oral.  She states that oxycodone works better.   -Continue Percocet per her schedule, a.m., lunch, evening and bedtime, oxycodone as needed for breakthrough pain -Currently doing well, states with current regimen of pain medications is working well and does not want it to be altered at the time of discharge.   Chronic obstructive pulmonary disease (HCC) Currently stable, no wheezing   Constipation -Continue MiraLAX, psyllium, senna   Stasis dermatitis -Continue triamcinolone cream -Unna boots once peripheral edema is improved  Obesity Estimated body mass index is 46.74 kg/m as calculated from the following:   Height as of 04/02/21: 5\' 5"  (1.651 m).   Weight as of this encounter: 127.4 kg. -PT OT evaluation, may need higher level of care, was an ALF with hospice  Discharge Planning- Ongoing, patient evaluated today by PT/OT, possible rehab? Family  Contact- left VM for granddaughter [name]  IDT- Updated Goals of care - Ongoing, patient states she wants to return to [facility] with hospice  Please do not hesitate to call with questions.    Thank you,   Farrel Gordon, RN, [facility] Liaison    [phone]

## 2021-07-16 NOTE — TOC Progression Note (Signed)
Transition of Care [facility]) - Progression Note    Patient Details  Name: Shirley Leblanc MRN: [id number] Date of Birth: 02/27/1946  [facility] CM/SW Contact  Reece Agar, [place] Phone Number: 07/16/2021, 12:49 PM  Clinical Narrative:    CSW spoke to pt granddaughter Shirley Leblanc and provided her with the contact information for [facility] since they were the only facility so far to accept pt for LTC. Shirley Leblanc will contact the facility about out of pocket costs and follow up with CSW to discuss DC plan.   Expected Discharge Plan: [place] Barriers to Discharge: Continued Medical Work up  Expected Discharge Plan and Services Expected Discharge Plan: [place] In-house Referral: Clinical Social Work   Post Acute Care Choice: [place] Living arrangements for the past 2 months: [place]                                       Social Determinants of Health (SDOH) Interventions    Readmission Risk Interventions No flowsheet data found.

## 2021-07-17 DIAGNOSIS — J431 Panlobular emphysema: Secondary | ICD-10-CM

## 2021-07-17 DIAGNOSIS — I4811 Longstanding persistent atrial fibrillation: Secondary | ICD-10-CM

## 2021-07-17 DIAGNOSIS — D696 Thrombocytopenia, unspecified: Secondary | ICD-10-CM

## 2021-07-17 DIAGNOSIS — I5033 Acute on chronic diastolic (congestive) heart failure: Secondary | ICD-10-CM

## 2021-07-17 LAB — BASIC METABOLIC PANEL
Anion gap: 6 (ref 5–15)
BUN: 17 mg/dL (ref 8–23)
CO2: 37 mmol/L — ABNORMAL HIGH (ref 22–32)
Calcium: 9.4 mg/dL (ref 8.9–10.3)
Chloride: 93 mmol/L — ABNORMAL LOW (ref 98–111)
Creatinine, Ser: 0.87 mg/dL (ref 0.44–1.00)
GFR, Estimated: >60 mL/min (ref >60)
Glucose, Bld: 103 mg/dL — ABNORMAL HIGH (ref 70–99)
Potassium: 5 mmol/L (ref 3.5–5.1)
Sodium: 136 mmol/L (ref 135–145)

## 2021-07-17 MED ORDER — SENNOSIDES-DOCUSATE SODIUM 8.6-50 MG PO TABS
2.0000 | ORAL_TABLET | Freq: Two times a day (BID) | ORAL | Status: DC
  Administered 2021-07-17 – 2021-07-19 (×5): 2 via ORAL
  Filled 2021-07-17 (×5): qty 2

## 2021-07-17 MED ORDER — FUROSEMIDE 10 MG/ML IJ SOLN
40.0000 mg | Freq: Two times a day (BID) | INTRAMUSCULAR | Status: DC
  Administered 2021-07-17 – 2021-07-18 (×2): 40 mg via INTRAVENOUS
  Filled 2021-07-17 (×2): qty 4

## 2021-07-17 NOTE — Progress Notes (Signed)
PROGRESS NOTE    Shirley Leblanc  VFI:[id number] DOB: 1946/03/28 DOA: 07/14/2021 PCP: Reymundo Poll, MD   Chief complaint.  Shortness of breath. Brief Narrative:  Patient is a 75 year old female with history of CAD, diastolic CHF, COPD, hypertension, hyperlipidemia presented with shortness of breath.   She is increasingly nonambulatory and has been having worsening edema.  Patient reported dyspnea on exertion and is on her usual home O2. Patient was found to be in volume overload, mild pitting edema/anasarca.  She has been taking torsemide daily.  She was given Lasix 40 mg IV x1   Assessment & Plan:   Principal Problem:   Acute on chronic diastolic CHF (congestive heart failure) (HCC) Active Problems:   Hypertension associated with diabetes ([place])   Atrial fibrillation (HCC)   Hypothyroid   Hyperlipidemia associated with type 2 diabetes mellitus (HCC)   Depression   Diabetes (HCC)   Chronic back pain   Chronic obstructive pulmonary disease (HCC)   Acute CHF (congestive heart failure) (HCC)  Acute on chronic diastolic congestive heart failure. Severe tricuspid regurgitation. Patient echocardiogram showed ejection fraction 60 to 65%.  Patient still has significant volume overload, I will increase Lasix to back to 40 mg twice a day. Continue Coreg.  COPD  Chronic hypoxemic respiratory failure. Patient does not have bronchospasm, oxygenation seems to be at baseline.  Chronic atrial fibrillation. Continue Coreg and aspirin.  Type 2 diabetes. Well-controlled, continue sliding scale insulin.   DVT prophylaxis: Lovenox Code Status: DNR Family Communication:  Disposition Plan:    Status is: Inpatient  Remains inpatient appropriate because: Severity of disease, IV Lasix.        I/O last 3 completed shifts: In: 4166 [P.O.:1420] Out: 1250 [Urine:1250] Total I/O In: 100 [P.O.:100] Out: 700 [Urine:700]     Consultants:  None  Procedures: None  Antimicrobials:  None  Subjective: Patient still has signal short of breath with exertion, on 2 L oxygen. She has no cough. She has no abdominal pain nausea vomiting. No fever or chills. No dysuria hematuria  Objective: Vitals:   07/16/21 1941 07/17/21 0031 07/17/21 0418 07/17/21 1049  BP: 109/65  115/66 124/65  Pulse: 82  79 69  Resp: 18  18 18   Temp: 98.3 F (36.8 C)  97.6 F (36.4 C) (!) 97 F (36.1 C)  TempSrc: Oral  Oral Oral  SpO2: 93%  92%   Weight:  126 kg      Intake/Output Summary (Last 24 hours) at 07/17/2021 1109 Last data filed at 07/17/2021 1053 Gross per 24 hour  Intake 1060 ml  Output 1500 ml  Net -440 ml   Filed Weights   07/15/21 0406 07/16/21 0403 07/17/21 0031  Weight: 125.7 kg 127.4 kg 126 kg    Examination:  General exam: Appears calm and comfortable  Respiratory system: Crackles in the base bilaterally. Respiratory effort normal. Cardiovascular system: Irregular, no JVD, murmurs, rubs, gallops or clicks.  Gastrointestinal system: Abdomen is nondistended, soft and nontender. No organomegaly or masses felt. Normal bowel sounds heard. Central nervous system: Alert and oriented x3. No focal neurological deficits. Extremities: Chronic lymphedema Skin: No rashes, lesions or ulcers Psychiatry: Mood & affect appropriate.     Data Reviewed: I have personally reviewed following labs and imaging studies  CBC: Recent Labs  Lab 07/14/21 1132 07/15/21 0132  WBC 4.2 4.6  HGB 10.5* 10.2*  HCT 35.5* 33.9*  MCV 91.0 89.7  PLT 116* 063*   Basic Metabolic Panel: Recent Labs  Lab 07/14/21  1132 07/15/21 0132 07/16/21 0140  NA 135 136 137  K 4.1 3.8 4.1  CL 97* 93* 94*  CO2 30 35* 38*  GLUCOSE 94 99 107*  BUN 12 10 14   CREATININE 0.89 0.83 0.86  CALCIUM 9.2 9.2 8.9   GFR: Estimated Creatinine Clearance: 75.5 mL/min (by C-G formula based on SCr of 0.86 mg/dL). Liver Function Tests: No results for input(s): AST, ALT, ALKPHOS, BILITOT, PROT, ALBUMIN in the  last 168 hours. No results for input(s): LIPASE, AMYLASE in the last 168 hours. No results for input(s): AMMONIA in the last 168 hours. Coagulation Profile: No results for input(s): INR, PROTIME in the last 168 hours. Cardiac Enzymes: No results for input(s): CKTOTAL, CKMB, CKMBINDEX, TROPONINI in the last 168 hours. BNP (last 3 results) No results for input(s): PROBNP in the last 8760 hours. HbA1C: Recent Labs    07/16/21 0140  HGBA1C 5.9*   CBG: No results for input(s): GLUCAP in the last 168 hours. Lipid Profile: No results for input(s): CHOL, HDL, LDLCALC, TRIG, CHOLHDL, LDLDIRECT in the last 72 hours. Thyroid Function Tests: Recent Labs    07/16/21 0140  TSH 2.909   Anemia Panel: No results for input(s): VITAMINB12, FOLATE, FERRITIN, TIBC, IRON, RETICCTPCT in the last 72 hours. Sepsis Labs: No results for input(s): PROCALCITON, LATICACIDVEN in the last 168 hours.  Recent Results (from the past 240 hour(s))  Resp Panel by RT-PCR (Flu A&B, Covid) Nasopharyngeal Swab     Status: None   Collection Time: 07/14/21  1:08 PM   Specimen: Nasopharyngeal Swab; Nasopharyngeal(NP) swabs in vial transport medium  Result Value Ref Range Status   SARS Coronavirus 2 by RT PCR NEGATIVE NEGATIVE Final    Comment: (NOTE) SARS-CoV-2 target nucleic acids are NOT DETECTED.  The SARS-CoV-2 RNA is generally detectable in upper respiratory specimens during the acute phase of infection. The lowest concentration of SARS-CoV-2 viral copies this assay can detect is 138 copies/mL. A negative result does not preclude SARS-Cov-2 infection and should not be used as the sole basis for treatment or other patient management decisions. A negative result may occur with  improper specimen collection/handling, submission of specimen other than nasopharyngeal swab, presence of viral mutation(s) within the areas targeted by this assay, and inadequate number of viral copies(<138 copies/mL). A negative  result must be combined with clinical observations, patient history, and epidemiological information. The expected result is Negative.  Fact Sheet for Patients:  EntrepreneurPulse.com.au  Fact Sheet for Healthcare Providers:  IncredibleEmployment.be  This test is no t yet approved or cleared by the [place] FDA and  has been authorized for detection and/or diagnosis of SARS-CoV-2 by FDA under an Emergency Use Authorization (EUA). This EUA will remain  in effect (meaning this test can be used) for the duration of the COVID-19 declaration under Section 564(b)(1) of the Act, 21 U.S.C.section 360bbb-3(b)(1), unless the authorization is terminated  or revoked sooner.       Influenza A by PCR NEGATIVE NEGATIVE Final   Influenza B by PCR NEGATIVE NEGATIVE Final    Comment: (NOTE) The Xpert Xpress SARS-CoV-2/FLU/RSV plus assay is intended as an aid in the diagnosis of influenza from Nasopharyngeal swab specimens and should not be used as a sole basis for treatment. Nasal washings and aspirates are unacceptable for Xpert Xpress SARS-CoV-2/FLU/RSV testing.  Fact Sheet for Patients: EntrepreneurPulse.com.au  Fact Sheet for Healthcare Providers: IncredibleEmployment.be  This test is not yet approved or cleared by the [place] and has been authorized for  detection and/or diagnosis of SARS-CoV-2 by FDA under an Emergency Use Authorization (EUA). This EUA will remain in effect (meaning this test can be used) for the duration of the COVID-19 declaration under Section 564(b)(1) of the Act, 21 U.S.C. section 360bbb-3(b)(1), unless the authorization is terminated or revoked.  Performed at [facility], [place] [address] [place], [place] [address]          Radiology Studies: No results found.      Scheduled Meds:  (feeding supplement) PROSource Plus  30 mL Oral BID BM   aspirin EC  81 mg  Oral Daily   atorvastatin  10 mg Oral Daily   busPIRone  20 mg Oral TID   carvedilol  3.125 mg Oral BID WC   clotrimazole  1 Applicatorful Vaginal QHS   docusate sodium  100 mg Oral BID   enoxaparin (LOVENOX) injection  40 mg Subcutaneous Q24H   furosemide  40 mg Intravenous Q12H   levothyroxine  88 mcg Oral Q0600   LORazepam  0.5 mg Oral BID   oxyCODONE-acetaminophen  1 tablet Oral 4 times per day   And   oxyCODONE  5 mg Oral 4 times per day   pantoprazole  40 mg Oral QPM   polyethylene glycol  17 g Oral Daily   psyllium  1 packet Oral QHS   senna  2 tablet Oral QHS   sertraline  200 mg Oral Daily   sodium chloride flush  3 mL Intravenous Q12H   tamsulosin  0.4 mg Oral QHS   triamcinolone 0.1 % cream : eucerin   Topical BID   Continuous Infusions:   LOS: 2 days    Time spent: 32 minutes    Sharen Hones, MD [facility]   To contact the attending provider between 7A-7P or the covering provider during after hours 7P-7A, please log into the web site [email or web address] and access using universal [place] password for that web site. If you do not have the password, please call the hospital operator.  07/17/2021, 11:09 AM

## 2021-07-17 NOTE — TOC Progression Note (Addendum)
Transition of Care [facility]) - Progression Note    Patient Details  Name: Shirley Leblanc MRN: [id number] Date of Birth: April 27, 1946  [facility]) CM/SW [place], [place] Phone Number: 07/17/2021, 3:09 PM  Clinical Narrative:    CSW spoke with pt granddaughter via phone to ask about pt final DC plan. PT granddaughter informed CSW that pt daughter wants to take that pt home with her with hospice following. CSW attempted to call pt daughter twice but call dropped, CSW went to pt room and no one was in the room either. CSW will follow up with pt daughter to confirm DC home tomorrow.  CSW spoke with pt daughter Shirley Leblanc who confirmed she was taking pt home with Hospice continuing to follow. [name]'s contact information is [phone].    Expected Discharge Plan: [place] Barriers to Discharge: Continued Medical Work up  Expected Discharge Plan and Services Expected Discharge Plan: [place] In-house Referral: Clinical Social Work   Post Acute Care Choice: [place] Living arrangements for the past 2 months: [place]                                       Social Determinants of Health (SDOH) Interventions    Readmission Risk Interventions No flowsheet data found.

## 2021-07-17 NOTE — Progress Notes (Addendum)
This chaplain responded to PMT consult for updating the Pt. HCPOA.  The Pt. is awake and willing to participate in the education for an AD update.  The chaplain understands the Pt. desired HCPOA is [employer].  The chaplain called [name] per the Pt. request. [name] agreed to the update. The chaplain filled out the [facility] documentation with the Pt. The chaplain is waiting on the notary and witnesses.  The chaplain understands  Lynelle Smoke is requesting conversation with the Pt. SW.  Lillia [place] [phone]  **[phone] This chaplain joined the Pt., notary, and witnesses for the notarizing of the Pt. HCPOA.  The Pt. was given the original HCPOA along with one copy. The chaplain scanned the documents into EMR.

## 2021-07-17 NOTE — Progress Notes (Signed)
[place] 3E11 [facility] hospitalized hospice patient note   [name] is a current [facility] hospice patient with a terminal diagnosis of hypertensive heart disease. [name] has been having worsening swelling and weakness over the last few days and began having chest pain along over the past 2 days. She was out to a family function and walked more than normal and said that this exacerbated her symptoms. Per request of the patient facility activated EMS and patient has been admitted to the hospital. She was admitted on 12.18 with a diagnosis of congestive heart failure. Per Dr. Gildardo Leblanc with [facility] this is a related hospice admission.  Visited patient at bedside. Hospital chaplain was at bedside also, assisted pateint with completing a HCPOA with her daughter Shirley Leblanc as her HCPOA.  No family in room at time of visit.  Patient denied discomfort and appeared comfortable.   Currently remains appropriate for inpatient for symptom management and coordination of SNF Placement for rehab or return home.   VS: 97, 12466, 69, 18, 92% RA I/O: 1400/1100  Abnormal labs: 07/17/21 11:31 Chloride: 93 (L) CO2: 37 (H) Glucose: 103 (H)  No new diagnostics today.   IV/PRN Medications: lasix 40mg  QD  Problem list:   Acute on chronic diastolic congestive heart failure. Severe tricuspid regurgitation. Patient echocardiogram showed ejection fraction 60 to 65%.  Patient still has significant volume overload, I will increase Lasix to back to 40 mg twice a day. Continue Coreg.   COPD  Chronic hypoxemic respiratory failure. Patient does not have bronchospasm, oxygenation seems to be at baseline.  Chronic atrial fibrillation. Continue Coreg and aspirin.   Discharge Planning- Ongoing, patient evaluated today by PT/OT, possible rehab? Home?  Family Contact- left VM for granddaughter Shirley Leblanc  IDT- Updated Goals of care - Ongoing, patient states she wants to return to [facility] with  hospice   Please do not hesitate to call with questions.     Thank you,   Shirley Gordon, RN, [facility] Liaison   [phone]

## 2021-07-18 LAB — CBC WITH DIFFERENTIAL/PLATELET
Abs Immature Granulocytes: 0.01 10*3/uL (ref 0.00–0.07)
Basophils Absolute: 0 10*3/uL (ref 0.0–0.1)
Basophils Relative: 1 %
Eosinophils Absolute: 0.1 10*3/uL (ref 0.0–0.5)
Eosinophils Relative: 2 %
HCT: 35.2 % — ABNORMAL LOW (ref 36.0–46.0)
Hemoglobin: 10.7 g/dL — ABNORMAL LOW (ref 12.0–15.0)
Immature Granulocytes: 0 %
Lymphocytes Relative: 28 %
Lymphs Abs: 1.2 10*3/uL (ref 0.7–4.0)
MCH: 27.2 pg (ref 26.0–34.0)
MCHC: 30.4 g/dL (ref 30.0–36.0)
MCV: 89.6 fL (ref 80.0–100.0)
Monocytes Absolute: 0.5 10*3/uL (ref 0.1–1.0)
Monocytes Relative: 12 %
Neutro Abs: 2.4 10*3/uL (ref 1.7–7.7)
Neutrophils Relative %: 57 %
Platelets: 102 10*3/uL — ABNORMAL LOW (ref 150–400)
RBC: 3.93 MIL/uL (ref 3.87–5.11)
RDW: 16.5 % — ABNORMAL HIGH (ref 11.5–15.5)
WBC: 4.2 10*3/uL (ref 4.0–10.5)
nRBC: 0 % (ref 0.0–0.2)

## 2021-07-18 LAB — MAGNESIUM: Magnesium: 1.7 mg/dL (ref 1.7–2.4)

## 2021-07-18 MED ORDER — ALBUTEROL SULFATE HFA 108 (90 BASE) MCG/ACT IN AERS: 1.0000 | INHALATION_SPRAY | RESPIRATORY_TRACT | 0 refills | Status: DC | PRN

## 2021-07-18 MED ORDER — LEVOTHYROXINE SODIUM 88 MCG PO TABS: 88.0000 ug | ORAL_TABLET | Freq: Every day | ORAL | 0 refills | Status: DC

## 2021-07-18 MED ORDER — DIVALPROEX SODIUM 125 MG PO DR TAB: 125.0000 mg | DELAYED_RELEASE_TABLET | Freq: Every day | ORAL | 0 refills | Status: DC

## 2021-07-18 MED ORDER — ATORVASTATIN CALCIUM 10 MG PO TABS: 10.0000 mg | ORAL_TABLET | Freq: Every morning | ORAL | 0 refills | Status: DC

## 2021-07-18 MED ORDER — PANTOPRAZOLE SODIUM 40 MG PO TBEC: 40.0000 mg | DELAYED_RELEASE_TABLET | Freq: Every evening | ORAL | 0 refills | Status: DC

## 2021-07-18 MED ORDER — POLYETHYLENE GLYCOL 3350 17 G PO PACK: 17.0000 g | PACK | Freq: Every morning | ORAL | 0 refills | Status: DC

## 2021-07-18 MED ORDER — ASPIRIN 81 MG PO TBEC: 81.0000 mg | DELAYED_RELEASE_TABLET | Freq: Every day | ORAL | 0 refills | Status: DC

## 2021-07-18 MED ORDER — CARVEDILOL 3.125 MG PO TABS: 3.1250 mg | ORAL_TABLET | Freq: Two times a day (BID) | ORAL | 0 refills | Status: DC

## 2021-07-18 MED ORDER — LORAZEPAM 0.5 MG PO TABS
0.5000 mg | ORAL_TABLET | Freq: Two times a day (BID) | ORAL | 0 refills | Status: DC | PRN
Start: 2021-07-18 — End: 2022-09-30

## 2021-07-18 MED ORDER — TAMSULOSIN HCL 0.4 MG PO CAPS: 0.4000 mg | ORAL_CAPSULE | Freq: Every day | ORAL | 0 refills | Status: DC

## 2021-07-18 MED ORDER — TORSEMIDE 20 MG PO TABS: 20.0000 mg | ORAL_TABLET | Freq: Two times a day (BID) | ORAL | Status: DC

## 2021-07-18 MED ORDER — BUSPIRONE HCL 10 MG PO TABS: 20.0000 mg | ORAL_TABLET | Freq: Three times a day (TID) | ORAL | 0 refills | Status: DC

## 2021-07-18 MED ORDER — SERTRALINE HCL 100 MG PO TABS: 200.0000 mg | ORAL_TABLET | Freq: Every day | ORAL | 0 refills | Status: DC

## 2021-07-18 MED ORDER — TORSEMIDE 20 MG PO TABS
20.0000 mg | ORAL_TABLET | Freq: Two times a day (BID) | ORAL | Status: DC
  Administered 2021-07-18 – 2021-07-19 (×2): 20 mg via ORAL
  Filled 2021-07-18 (×2): qty 1

## 2021-07-18 NOTE — Discharge Summary (Addendum)
Physician Discharge Summary  Patient ID: Shirley Leblanc MRN: [id number] DOB/AGE: 1946-02-08 75 y.o.  Admit date: 07/14/2021 Discharge date: 07/18/2021  Admission Diagnoses:  Discharge Diagnoses:  Principal Problem:   Acute on chronic diastolic CHF (congestive heart failure) (HCC) Active Problems:   Hypertension associated with diabetes ([place])   Atrial fibrillation (HCC)   Hypothyroid   Hyperlipidemia associated with type 2 diabetes mellitus (HCC)   Depression   Diabetes (HCC)   Chronic back pain   Chronic obstructive pulmonary disease (HCC)   Acute CHF (congestive heart failure) (HCC)   Thrombocytopenia ([place])   Discharged Condition: good  Hospital Course:  Patient is a 75 year old female with history of CAD, diastolic CHF, COPD, hypertension, hyperlipidemia presented with shortness of breath.   She is increasingly nonambulatory and has been having worsening edema.  Patient reported dyspnea on exertion and is on her usual home O2. Patient was found to be in volume overload, mild pitting edema/anasarca.  She has been taking torsemide daily.  She was given Lasix 40 mg IV x1  Acute on chronic diastolic congestive heart failure. Severe tricuspid regurgitation. Moderate pulmonary hypertension. Patient echocardiogram showed ejection fraction 60 to 65%, moderate pulmonary hypertension and severe tricuspid regurgitation. Patient received IV Lasix, volume status much better. Will continue Coreg, blood pressure is borderline, hold off ACE inhibitor for now.  May restart in a week time.  I will also restart torsemide 20 mg twice a day.  Patient be followed by PCP in 1 week time.  Patient will be also followed by outpatient hospice.   COPD  Chronic hypoxemic respiratory failure. Patient does not have bronchospasm, oxygenation seems to be at baseline.  Chronic atrial fibrillation. Continue Coreg and aspirin.  Type 2 diabetes. Well-controlled.      Consults: None  Significant  Diagnostic Studies:  Echo: Left ventricular ejection fraction, by estimation, is 60 to 65%. Left ventricular ejection fraction by 3D volume is 60 %. The left ventricle has normal function. The left ventricle has no regional wall motion abnormalities. There is mild left ventricular hypertrophy. Left ventricular diastolic parameters are indeterminate. There is the interventricular septum is flattened in systole and diastole, consistent with right ventricular pressure and volume overload. 1. Right ventricular systolic function is moderately reduced. The right ventricular size is moderately enlarged. There is moderately elevated pulmonary artery systolic pressure. The estimated right ventricular systolic pressure is 52.8 mmHg. 2. The mitral valve is degenerative. Mild mitral valve regurgitation. Mild mitral stenosis. The mean mitral valve gradient is 4.0 mmHg at 81 bpm. Moderate mitral annular calcification. 3. 4. The tricuspid valve is abnormal. Tricuspid valve regurgitation is severe. The aortic valve is tricuspid. Aortic valve regurgitation is not visualized. Aortic valve sclerosis/calcification is present, without any evidence of aortic stenosis. 5. 6. Aortic dilatation noted. There is dilatation of the ascending aorta, measuring 41 mm. The inferior vena cava is dilated in size with <50% respiratory variability, suggesting right atrial pressure of 15 mmHg.  Treatments: IV lasix  Discharge Exam: Blood pressure 115/77, pulse 72, temperature 97.9 F (36.6 C), temperature source Oral, resp. rate 20, weight 123.6 kg, SpO2 96 %. General appearance: alert and cooperative Resp: clear to auscultation bilaterally Cardio: irregular,  no murmur, click, rub or gallop GI: soft, non-tender; bowel sounds normal; no masses,  no organomegaly Extremities: 1+ edema with chronic venous stasis  Disposition: Discharge disposition: 50-Hospice/Home       Discharge Instructions     Diet - low  sodium heart healthy   Complete  by: As directed    Increase activity slowly   Complete by: As directed       Allergies as of 07/18/2021       Reactions   Iodinated Diagnostic Agents Anaphylaxis   Penicillins Hives, Itching, Rash   Has patient had a PCN reaction causing immediate rash, facial/tongue/throat swelling, SOB or lightheadedness with hypotension Yes Has patient had a PCN reaction causing severe rash involving mucus membranes or skin necrosis: Yes Has patient had a PCN reaction that required hospitalization No Has patient had a PCN reaction occurring within the last 10 years: No If all of the above answers are "NO", then may proceed with Cephalosporin use.   Sulfa Antibiotics Hives, Itching, Rash   Penicillin G Hives, Itching, Rash        Medication List     STOP taking these medications    furosemide 20 MG tablet Commonly known as: LASIX   furosemide 40 MG tablet Commonly known as: LASIX   lisinopril 40 MG tablet Commonly known as: ZESTRIL   morphine CONCENTRATE 10 mg / 0.5 ml concentrated solution   multivitamin with minerals Tabs tablet   quinapril 40 MG tablet Commonly known as: ACCUPRIL       TAKE these medications    (feeding supplement) PROSource Plus liquid Take 30 mLs by mouth 2 (two) times daily between meals.   acetaminophen 325 MG tablet Commonly known as: TYLENOL Take 650 mg by mouth every 12 (twelve) hours as needed for mild pain or fever.   albuterol 108 (90 Base) MCG/ACT inhaler Commonly known as: VENTOLIN HFA TAKE 2 PUFFS BY MOUTH EVERY 6 HOURS AS NEEDED FOR WHEEZE OR SHORTNESS OF BREATH What changed: See the new instructions.   aspirin 81 MG EC tablet Take 1 tablet (81 mg total) by mouth daily. Swallow whole.   atorvastatin 10 MG tablet Commonly known as: LIPITOR Take 10 mg by mouth every morning.   blood glucose meter kit and supplies Kit Dispense per insurance preference. Use up to four times daily . E 11.9   busPIRone  10 MG tablet Commonly known as: BUSPAR Take 20 mg by mouth 3 (three) times daily.   carvedilol 3.125 MG tablet Commonly known as: COREG Take 1 tablet (3.125 mg total) by mouth 2 (two) times daily with a meal.   diclofenac Sodium 1 % Gel Commonly known as: VOLTAREN Apply 1 application topically 2 (two) times daily as needed (neck and shoulder pain).   divalproex 125 MG DR tablet Commonly known as: DEPAKOTE Take 125 mg by mouth at bedtime.   docusate sodium 100 MG capsule Commonly known as: COLACE Take 1 capsule (100 mg total) by mouth 2 (two) times daily as needed for mild constipation.   glucose blood test strip Commonly known as: Glucose Meter Test Use BID   hydrocortisone cream 1 % Apply 1 application topically 2 (two) times daily as needed for itching. Preparation H hydrocortisone   levothyroxine 88 MCG tablet Commonly known as: SYNTHROID Take 88 mcg by mouth daily before breakfast. What changed: Another medication with the same name was removed. Continue taking this medication, and follow the directions you see here.   loperamide 2 MG tablet Commonly known as: IMODIUM A-D Take 2 mg by mouth 3 (three) times daily as needed for diarrhea or loose stools.   LORazepam 0.5 MG tablet Commonly known as: ATIVAN Take 0.5 mg by mouth 2 (two) times daily. What changed: Another medication with the same name was removed. Continue taking  this medication, and follow the directions you see here.   ondansetron 8 MG tablet Commonly known as: ZOFRAN Take 8 mg by mouth every 8 (eight) hours as needed for nausea or vomiting.   oxyCODONE 5 MG immediate release tablet Commonly known as: Oxy IR/ROXICODONE Take 5 mg by mouth every 2 (two) hours as needed (pain).   oxyCODONE-acetaminophen 10-325 MG tablet Commonly known as: PERCOCET Take 1 tablet by mouth in the morning, at noon, in the evening, and at bedtime.   pantoprazole 40 MG tablet Commonly known as: PROTONIX Take 40 mg by  mouth every evening. 1600   polyethylene glycol 17 g packet Commonly known as: MIRALAX / GLYCOLAX Take 17 g by mouth every morning.   PSYLLIUM PO Take 1 capsule by mouth at bedtime. Take with 8 oz of water   Salonpas 3.08-02-08 % Ptch Generic drug: Camphor-Menthol-Methyl Sal Apply 1 patch topically every 12 (twelve) hours as needed (left shoulder pain).   senna 8.6 MG tablet Commonly known as: SENOKOT Take 2 tablets by mouth at bedtime.   sertraline 100 MG tablet Commonly known as: ZOLOFT Take 200 mg by mouth at bedtime.   simethicone 125 MG chewable tablet Commonly known as: MYLICON Chew 183 mg by mouth every 6 (six) hours as needed for flatulence.   sodium chloride 0.65 % Soln nasal spray Commonly known as: OCEAN Place 1 spray into both nostrils as needed for congestion (nose irritation).   tamsulosin 0.4 MG Caps capsule Commonly known as: FLOMAX Take 0.4 mg by mouth at bedtime.   torsemide 20 MG tablet Commonly known as: DEMADEX Take 1 tablet (20 mg total) by mouth 2 (two) times daily.        Follow-up Information     Reymundo Poll, MD Follow up in 1 week(s).   Specialty: Family Medicine Contact information: [place]. [address] [place] [address] [phone]                32 minutes Signed: Sharen Hones 07/18/2021, 9:44 AM

## 2021-07-18 NOTE — Progress Notes (Signed)
[place] 3E11 [facility] hospitalized hospice patient note  [place] Liaison ordered oxygen to be delivered to address of her daughter Lynelle Smoke for discharge this evening.  Lacretia Leigh, CSW aware and will arrange transport via [facility] for patient once oxygen delivery is confirmed.   Please do not hesitate to call with questions.     Thank you,   Farrel Gordon, RN, [facility] Liaison   [phone]

## 2021-07-18 NOTE — TOC Progression Note (Signed)
Transition of Care [facility]) - Progression Note    Patient Details  Name: SITLALI KOERNER MRN: [id number] Date of Birth: 04-12-1946  Transition of Care [facility]) CM/SW [place], RN Phone Number:[phone]  07/18/2021, 3:26 PM  Clinical Narrative:    CM received call from [facility] supervisor Olga Coaster stating that CN wanted to speak with CM. Charge nurse inquiring about CM setting up transport for patient discharging home. This writer told CN that she  would follow up for PTAR transport. CM read previous note from SW stating that [facility] is waiting for word from Hospice on the DME to be delivered. Cm spoke with SW Symone to verify that this is still the plan. CM has made charge nurse Tai aware that transport has not been schedules due to DME has not been delivered. TOC supervisor Olga Coaster has also been updated. TOC will continue to follow.    Expected Discharge Plan: [place] Barriers to Discharge: Continued Medical Work up  Expected Discharge Plan and Services Expected Discharge Plan: [place] In-house Referral: Clinical Social Work   Post Acute Care Choice: [place] Living arrangements for the past 2 months: [place] Expected Discharge Date: 07/18/21                                     Social Determinants of Health (SDOH) Interventions    Readmission Risk Interventions No flowsheet data found.

## 2021-07-18 NOTE — TOC Progression Note (Addendum)
Transition of Care [facility]) - Progression Note    Patient Details  Name: Shirley Leblanc MRN: [id number] Date of Birth: 1946/04/20  Transition of Care [facility]) CM/SW [place], [place] Phone Number: 07/18/2021, 1:24 PM  Clinical Narrative:    CSW spoke with [name] (pt daughter) who is in the process of getting pt a smaller hospital bed at the home she will be living in. CSW inquired about transport and [name] stated that she would like to use [facility]. The pt new address will be [place] in [place], [place]. Pt meds are to go to the [facility] in [place].  CSW spoke with Audrea Muscat with Hospice about concerns of [name]'s DC plan. Audrea Muscat did not know anything about this plan with the daughter. CSW contacted [name] back to inform her that pt cannot be Dc'ed without her oxygen and DME. Pt is not wanting to send the pt back to [facility] by any means and does not like any SNF's. CSW contacted Audrea Muscat to provide update, Audrea Muscat stated she would get pt oxygen sent over today but is not sure that all equipment can be delivered today. Audrea Muscat will contact CSW if she gets an ETA on o2  CSW will then contact [facility] for transport.   Expected Discharge Plan: [place] Barriers to Discharge: Continued Medical Work up  Expected Discharge Plan and Services Expected Discharge Plan: [place] In-house Referral: Clinical Social Work   Post Acute Care Choice: [place] Living arrangements for the past 2 months: [place] Expected Discharge Date: 07/18/21                                     Social Determinants of Health (SDOH) Interventions    Readmission Risk Interventions No flowsheet data found.

## 2021-07-18 NOTE — Progress Notes (Signed)
Physical Therapy Treatment Patient Details Name: Shirley Leblanc MRN: [id number] DOB: [birth year] Today's Date: 07/18/2021   History of Present Illness [facility] [place] is a 75 y.o. female with medical history significant of CAD; diastolic CHF; COPD; HTN; HLD; and obesity presenting with SOB.   She was previously admitted from 9/6-12 with acute on chronic diastolic CHF and was discharged back to ALF with hospice.  She is increasingly non-ambulatory and has worsening edema.    PT Comments    Pt is now planning to go home with hospice services. Pt reporting and granddaughter confirming via phone call that pt is planning to d/c to live with her daughter's mother-in-law, who is a retired Therapist, sports, in a home with a walk-in shower. RN reports the family has reported there is a ramp entrance option. Granddaughter confirmed pt has a w/c, RW, and rollator already. Pt would benefit from a 3-in-1 to act as a shower chair and bedside commode. Educated pt's granddaughter on pt's impulsivity to sit, tendency to fatigue quickly, and need for constant physical assistance for mobility for safety. Educated her that pt does better with RW than HHA. Pt was able to come to stand and walk up to ~5 ft with a RW and minA this date. Will continue to follow acutely if pt does not d/c home as planned today.    Recommendations for follow up therapy are one component of a multi-disciplinary discharge planning process, led by the attending physician.  Recommendations may be updated based on patient status, additional functional criteria and insurance authorization.  Follow Up Recommendations  Skilled nursing-short term rehab (<3 hours/day) (but planning for going home with hospice instead)     Assistance Recommended at Discharge Intermittent Supervision/Assistance  Equipment Recommendations  BSC/3in1    Recommendations for Other Services       Precautions / Restrictions Precautions Precautions: Fall Restrictions Weight Bearing  Restrictions: No     Mobility  Bed Mobility               General bed mobility comments: RN was assisting pt up to EOB upon arrival.    Transfers Overall transfer level: Needs assistance Equipment used: Rolling walker (2 wheels);2 person hand held assist Transfers: Sit to/from Stand;Bed to chair/wheelchair/BSC Sit to Stand: Min assist;+2 safety/equipment     Step pivot transfers: Min assist;+2 safety/equipment     General transfer comment: RN assisting throughout for safety as pt can impulsive to sit prematurely at times. Pt a bit tremulous and unstable, needing minA for stability coming to stand, 1x from EOB to bil HHA and 3x from commode either to HHA or RW. Stand step to L 2x with bil HHA first rep and RW second rep with minA for stability and guidance, needing cues to lign up body with seat prior to sitting. Pt needed guidance of her buttocks to fully make the seat at one point. Improved stability with using the RW noted.    Ambulation/Gait Ambulation/Gait assistance: Min assist;+2 safety/equipment Gait Distance (Feet): 5 Feet (x2 bouts of ~2 ft > ~5 ft) Assistive device: Rolling walker (2 wheels) Gait Pattern/deviations: Step-through pattern;Decreased stride length;Shuffle;Trunk flexed Gait velocity: reduced Gait velocity interpretation: <1.31 ft/sec, indicative of household ambulator   General Gait Details: Pt with slow, shuffling side steps and a few anterior and posterior steps for transfers bed > commode > recliner. Pt with tremulous/unsteady movement, needing minA for stability and to safely direct her to target surfaces.   Stairs  Wheelchair Mobility    Modified Rankin (Stroke Patients Only)       Balance Overall balance assessment: Needs assistance Sitting-balance support: Feet supported;No upper extremity supported Sitting balance-Leahy Scale: Fair     Standing balance support: Bilateral upper extremity supported Standing  balance-Leahy Scale: Poor Standing balance comment: reliant on B UEs                            Cognition Arousal/Alertness: Awake/alert Behavior During Therapy: WFL for tasks assessed/performed;Impulsive (mildly) Overall Cognitive Status: Impaired/Different from baseline Area of Impairment: Memory;Following commands;Safety/judgement;Awareness;Problem solving;Attention                   Current Attention Level: Sustained Memory: Decreased short-term memory Following Commands: Follows one step commands with increased time Safety/Judgement: Decreased awareness of safety;Decreased awareness of deficits Awareness: Emergent Problem Solving: Slow processing;Decreased initiation;Difficulty sequencing;Requires verbal cues General Comments: Pt can be mildly impulsive, leaning anteriorly or coming to stand prior to therapist being ready at times. Pt needs cues to maintain safety and sequence transfers. Slow processing and poor attention span, needing redirecting at times. Pt repeats comments and contradicts herself at times.        Exercises      General Comments General comments (skin integrity, edema, etc.): Contacted pt's granddaughter to review need for someone to assist the pt with all mobility due to safety concerns and pt impulsivity and quick fatigue. Discussed equipment needs also.      Pertinent Vitals/Pain Pain Assessment: Faces Faces Pain Scale: No hurt Pain Intervention(s): Monitored during session    Home Living                          Prior Function            PT Goals (current goals can now be found in the care plan section) Acute Rehab PT Goals Patient Stated Goal: to go home PT Goal Formulation: With patient/family Time For Goal Achievement: 07/29/21 Potential to Achieve Goals: Fair Progress towards PT goals: Progressing toward goals    Frequency    Min 3X/week      PT Plan Frequency needs to be updated;Discharge plan needs  to be updated;Equipment recommendations need to be updated    Co-evaluation              AM-PAC PT "6 Clicks" Mobility   Outcome Measure  Help needed turning from your back to your side while in a flat bed without using bedrails?: A Little Help needed moving from lying on your back to sitting on the side of a flat bed without using bedrails?: A Little Help needed moving to and from a bed to a chair (including a wheelchair)?: A Little Help needed standing up from a chair using your arms (e.g., wheelchair or bedside chair)?: A Little Help needed to walk in hospital room?: Total Help needed climbing 3-5 steps with a railing? : Total 6 Click Score: 14    End of Session Equipment Utilized During Treatment: Gait belt Activity Tolerance: Patient limited by fatigue Patient left: in chair;with call bell/phone within reach;with chair alarm set Nurse Communication: Mobility status PT Visit Diagnosis: Other abnormalities of gait and mobility (R26.89);Muscle weakness (generalized) (M62.81);Unsteadiness on feet (R26.81);Difficulty in walking, not elsewhere classified (R26.2)     Time: 1411-1430 PT Time Calculation (min) (ACUTE ONLY): 19 min  Charges:  $Therapeutic Activity: 8-22 mins  Moishe Spice, PT, DPT Acute Rehabilitation Services  Pager: [phone] Office: [place] 07/18/2021, 3:22 PM

## 2021-07-19 NOTE — Progress Notes (Signed)
[place] 3E11 [facility] hospitalized hospice patient note   [name] is a current [facility] hospice patient with a terminal diagnosis of hypertensive heart disease. [name] has been having worsening swelling and weakness over the last few days and began having chest pain along over the past 2 days. She was out to a family function and walked more than normal and said that this exacerbated her symptoms. Per request of the patient facility activated EMS and patient has been admitted to the hospital. She was admitted on 12.18 with a diagnosis of congestive heart failure. Per Dr. Gildardo Cranker with [facility] this is a related hospice admission.   Patient is alert and in no apparent distress. She is planned to discharge today.    VS: 98.3, 137/82, 97, 20, 90% RA I/O: 417/1450  Abnormal Labs: 07/18/21 02:55 Magnesium: 1.7 Hemoglobin: 10.7 (L) HCT: 35.2 (L) RDW: 16.5 (H) Platelets: 102 (L)  IV/PRN Medications: lasix 40mg  QD   Problem list:   Acute on chronic diastolic congestive heart failure. Severe tricuspid regurgitation. Patient echocardiogram showed ejection fraction 60 to 65%.  Patient still has significant volume overload, I will increase Lasix to back to 40 mg twice a day. Continue Coreg.   COPD  Chronic hypoxemic respiratory failure. Patient does not have bronchospasm, oxygenation seems to be at baseline.  Chronic atrial fibrillation. Continue Coreg and aspirin.    Discharge Planning- Patient to discharge to daughter, Shirley Leblanc's home tonight after oxygen is delivered. Family Contact- Spoke with daughter Shirley Leblanc  IDT- Updated Goals of care - Clear. DNR   Please do not hesitate to call with questions.     Thank you,   Farrel Gordon, RN, [facility] Liaison   [phone]

## 2021-07-19 NOTE — Progress Notes (Signed)
Occupational Therapy Treatment Patient Details Name: Shirley Leblanc MRN: [id number] DOB: Mar 20, 1946 Today's Date: 07/19/2021   History of present illness Shirley Leblanc is a 75 y.o. female with medical history significant of CAD; diastolic CHF; COPD; HTN; HLD; and obesity presenting with SOB.   She was previously admitted from 9/6-12 with acute on chronic diastolic CHF and was discharged back to ALF with hospice.  She is increasingly non-ambulatory and has worsening edema.   OT comments  Pt. Seen for skilled OT treatment session.  Max redirection and assistance with calming pt. Secondary to notable confusion and STM issues related to her d/c plans for the day.  Able to complete bed mobility in/out of bed with min a.  Including scooting several times towards hob prior to laying down.  Current recommendations remain appropriate.     Recommendations for follow up therapy are one component of a multi-disciplinary discharge planning process, led by the attending physician.  Recommendations may be updated based on patient status, additional functional criteria and insurance authorization.    Follow Up Recommendations  Skilled nursing-short term rehab (<3 hours/day)    Assistance Recommended at Discharge Frequent or constant Supervision/Assistance  Equipment Recommendations  None recommended by OT    Recommendations for Other Services      Precautions / Restrictions Precautions Precautions: Fall Restrictions Weight Bearing Restrictions: No       Mobility Bed Mobility Overal bed mobility: Needs Assistance Bed Mobility: Rolling;Sidelying to Sit;Sit to Sidelying Rolling: Min assist Sidelying to sit: Min assist Supine to sit: [facility] elevated;Min assist     General bed mobility comments: able to follow verbal and tactile cues for sequencing and safe tech. for sitting eob and then returning to bed    Transfers                   General transfer comment: able to scoot x4 towards hob  prior to laying back down     Balance                                           ADL either performed or assessed with clinical judgement   ADL Overall ADL's : Needs assistance/impaired                                       General ADL Comments: focus of session was bed mobility in preparation for increasing safety and independence with mobility progression and integration of adls.  min cues for sequencing and light min a to initiate roll and reaching for bed rails.  able to pull self upright and initiate scooting with cues.  also able to assist with back to bed    Extremity/Trunk Assessment              Vision       Perception     Praxis      Cognition Arousal/Alertness: Awake/alert Behavior During Therapy: [facility] for tasks assessed/performed;Impulsive Overall Cognitive Status: Impaired/Different from baseline Area of Impairment: Memory;Following commands;Safety/judgement;Awareness;Problem solving;Attention                   Current Attention Level: Sustained Memory: Decreased short-term memory Following Commands: Follows one step commands with increased time Safety/Judgement: Decreased awareness of safety;Decreased awareness of deficits   Problem Solving: Slow  processing;Decreased initiation;Difficulty sequencing;Requires verbal cues General Comments: perseverating on if she will get to go home.  requesting to call multiple family members with no recollection that she had already spoken with them per rn report. stating "they are trying to keep me here" would not explain who. would not believe that she was going home today with multiple attempts at reasurance.          Exercises     Shoulder Instructions       General Comments      Pertinent Vitals/ Pain       Pain Assessment: No/denies pain Breathing: normal Negative Vocalization: none Facial Expression: smiling or inexpressive Body Language: relaxed Consolability: no  need to console PAINAD Score: 0  Home Living                                          Prior Functioning/Environment              Frequency  Min 2X/week        Progress Toward Goals  OT Goals(current goals can now be found in the care plan section)  Progress towards OT goals: Progressing toward goals     Plan Discharge plan remains appropriate    Co-evaluation                 AM-PAC OT "6 Clicks" Daily Activity     Outcome Measure   Help from another person eating meals?: A Little Help from another person taking care of personal grooming?: A Little Help from another person toileting, which includes using toliet, bedpan, or urinal?: A Lot Help from another person bathing (including washing, rinsing, drying)?: A Lot Help from another person to put on and taking off regular upper body clothing?: A Little Help from another person to put on and taking off regular lower body clothing?: Total 6 Click Score: 14    End of Session    OT Visit Diagnosis: Unsteadiness on feet (R26.81);Other abnormalities of gait and mobility (R26.89);Muscle weakness (generalized) (M62.81);Other symptoms and signs involving cognitive function   Activity Tolerance Patient tolerated treatment well   Patient Left in bed;with call bell/phone within reach;with bed alarm set   Nurse Communication Other (comment) (spoke with rn with pts. concern about d/c, rn was aware and reports she had also reviewed with tp. she was going home and assisted her with speaking with family members.)        Time: 0867-6195 OT Time Calculation (min): 13 min  Charges: OT General Charges $OT Visit: 1 Visit OT Treatments $Self Care/Home Management : 8-22 mins  Sonia Baller, COTA/L Acute Rehabilitation [phone]   Tanya Nones 07/19/2021, 11:21 AM

## 2021-07-19 NOTE — Discharge Summary (Signed)
Physician Discharge Summary  Patient ID: Shirley Leblanc MRN: [id number] DOB/AGE: Apr 05, 1946 75 y.o.  Admit date: 07/14/2021 Discharge date: 07/19/2021  Admission Diagnoses:  Discharge Diagnoses:  Principal Problem:   Acute on chronic diastolic CHF (congestive heart failure) (HCC) Active Problems:   Hypertension associated with diabetes ([place])   Atrial fibrillation (HCC)   Hypothyroid   Hyperlipidemia associated with type 2 diabetes mellitus (HCC)   Depression   Diabetes (HCC)   Chronic back pain   Chronic obstructive pulmonary disease (HCC)   Acute CHF (congestive heart failure) (HCC)   Thrombocytopenia (HCC)   Discharged Condition: good  Patient is a 75 year old female with history of CAD, diastolic CHF, COPD, hypertension, hyperlipidemia presented with shortness of breath.   She is increasingly nonambulatory and has been having worsening edema.  Patient reported dyspnea on exertion and is on her usual home O2. Patient was found to be in volume overload, mild pitting edema/anasarca.  She has been taking torsemide daily.  She was given Lasix 40 mg IV x1   Acute on chronic diastolic congestive heart failure. Severe tricuspid regurgitation. Moderate pulmonary hypertension. Patient echocardiogram showed ejection fraction 60 to 65%, moderate pulmonary hypertension and severe tricuspid regurgitation. Patient received IV Lasix, volume status much better. Will continue Coreg, blood pressure is borderline, hold off ACE inhibitor for now.  May restart in a week time.  I will also restart torsemide 20 mg twice a day.  Patient be followed by PCP in 1 week time.  Patient will be also followed by outpatient hospice.   COPD  Chronic hypoxemic respiratory failure. Patient does not have bronchospasm, oxygenation seems to be at baseline.  Chronic atrial fibrillation. Continue Coreg and aspirin.  Type 2 diabetes. Well-controlled.   Patient was scheduled to be transferred home with hospice  yesterday, due to weather, transportation was not arranged.  Patient will be discharged home today.  Patient overnight had some confusion as she could not sleep.  No worsening of congestive heart failure.  She is still medically stable to be discharged.       Consults: None   Significant Diagnostic Studies:  Echo: Left ventricular ejection fraction, by estimation, is 60 to 65%. Left ventricular ejection fraction by 3D volume is 60 %. The left ventricle has normal function. The left ventricle has no regional wall motion abnormalities. There is mild left ventricular hypertrophy. Left ventricular diastolic parameters are indeterminate. There is the interventricular septum is flattened in systole and diastole, consistent with right ventricular pressure and volume overload. 1. Right ventricular systolic function is moderately reduced. The right ventricular size is moderately enlarged. There is moderately elevated pulmonary artery systolic pressure. The estimated right ventricular systolic pressure is 41.3 mmHg. 2. The mitral valve is degenerative. Mild mitral valve regurgitation. Mild mitral stenosis. The mean mitral valve gradient is 4.0 mmHg at 81 bpm. Moderate mitral annular calcification. 3. 4. The tricuspid valve is abnormal. Tricuspid valve regurgitation is severe. The aortic valve is tricuspid. Aortic valve regurgitation is not visualized. Aortic valve sclerosis/calcification is present, without any evidence of aortic stenosis. 5. 6. Aortic dilatation noted. There is dilatation of the ascending aorta, measuring 41 mm. The inferior vena cava is dilated in size with <50% respiratory variability, suggesting right atrial pressure of 15 mmHg.   Treatments: IV lasix Discharge Exam: Blood pressure 137/82, pulse 97, temperature 98.3 F (36.8 C), temperature source Oral, resp. rate 20, weight 123.6 kg, SpO2 90 %. General appearance: alert, cooperative, and confused Resp: clear to  auscultation  bilaterally Cardio: regular rate and rhythm, S1, S2 normal, no murmur, click, rub or gallop GI: soft, non-tender; bowel sounds normal; no masses,  no organomegaly Extremities: edema 1+  Disposition: Discharge disposition: 50-Hospice/Home       Discharge Instructions     Diet - low sodium heart healthy   Complete by: As directed    Increase activity slowly   Complete by: As directed       Allergies as of 07/19/2021       Reactions   Iodinated Diagnostic Agents Anaphylaxis   Penicillins Hives, Itching, Rash   Has patient had a PCN reaction causing immediate rash, facial/tongue/throat swelling, SOB or lightheadedness with hypotension Yes Has patient had a PCN reaction causing severe rash involving mucus membranes or skin necrosis: Yes Has patient had a PCN reaction that required hospitalization No Has patient had a PCN reaction occurring within the last 10 years: No If all of the above answers are "NO", then may proceed with Cephalosporin use.   Sulfa Antibiotics Hives, Itching, Rash   Penicillin G Hives, Itching, Rash        Medication List     STOP taking these medications    furosemide 20 MG tablet Commonly known as: LASIX   furosemide 40 MG tablet Commonly known as: LASIX   lisinopril 40 MG tablet Commonly known as: ZESTRIL   morphine CONCENTRATE 10 mg / 0.5 ml concentrated solution   multivitamin with minerals Tabs tablet   oxyCODONE 5 MG immediate release tablet Commonly known as: Oxy IR/ROXICODONE   oxyCODONE-acetaminophen 10-325 MG tablet Commonly known as: PERCOCET   quinapril 40 MG tablet Commonly known as: ACCUPRIL       TAKE these medications    (feeding supplement) PROSource Plus liquid Take 30 mLs by mouth 2 (two) times daily between meals.   acetaminophen 325 MG tablet Commonly known as: TYLENOL Take 650 mg by mouth every 12 (twelve) hours as needed for mild pain or fever.   albuterol 108 (90 Base) MCG/ACT  inhaler Commonly known as: VENTOLIN HFA Inhale 1 puff into the lungs every 4 (four) hours as needed for wheezing or shortness of breath. TAKE 2 PUFFS BY MOUTH EVERY 6 HOURS AS NEEDED FOR WHEEZE OR SHORTNESS OF BREATH Strength: 108 (90 Base) MCG/ACT What changed: See the new instructions.   aspirin 81 MG EC tablet Take 1 tablet (81 mg total) by mouth daily. Swallow whole.   atorvastatin 10 MG tablet Commonly known as: LIPITOR Take 1 tablet (10 mg total) by mouth every morning.   blood glucose meter kit and supplies Kit Dispense per insurance preference. Use up to four times daily . E 11.9   busPIRone 10 MG tablet Commonly known as: BUSPAR Take 2 tablets (20 mg total) by mouth 3 (three) times daily.   carvedilol 3.125 MG tablet Commonly known as: COREG Take 1 tablet (3.125 mg total) by mouth 2 (two) times daily with a meal.   diclofenac Sodium 1 % Gel Commonly known as: VOLTAREN Apply 1 application topically 2 (two) times daily as needed (neck and shoulder pain).   divalproex 125 MG DR tablet Commonly known as: DEPAKOTE Take 1 tablet (125 mg total) by mouth at bedtime.   docusate sodium 100 MG capsule Commonly known as: COLACE Take 1 capsule (100 mg total) by mouth 2 (two) times daily as needed for mild constipation.   glucose blood test strip Commonly known as: Glucose Meter Test Use BID   hydrocortisone cream 1 % Apply 1  application topically 2 (two) times daily as needed for itching. Preparation H hydrocortisone   levothyroxine 88 MCG tablet Commonly known as: SYNTHROID Take 1 tablet (88 mcg total) by mouth daily before breakfast. What changed: Another medication with the same name was removed. Continue taking this medication, and follow the directions you see here.   loperamide 2 MG tablet Commonly known as: IMODIUM A-D Take 2 mg by mouth 3 (three) times daily as needed for diarrhea or loose stools.   LORazepam 0.5 MG tablet Commonly known as: ATIVAN Take 1  tablet (0.5 mg total) by mouth 2 (two) times daily as needed for anxiety. What changed:  when to take this reasons to take this Another medication with the same name was removed. Continue taking this medication, and follow the directions you see here.   ondansetron 8 MG tablet Commonly known as: ZOFRAN Take 8 mg by mouth every 8 (eight) hours as needed for nausea or vomiting.   pantoprazole 40 MG tablet Commonly known as: PROTONIX Take 1 tablet (40 mg total) by mouth every evening. 1600   polyethylene glycol 17 g packet Commonly known as: MIRALAX / GLYCOLAX Take 17 g by mouth every morning.   PSYLLIUM PO Take 1 capsule by mouth at bedtime. Take with 8 oz of water   Salonpas 3.08-02-08 % Ptch Generic drug: Camphor-Menthol-Methyl Sal Apply 1 patch topically every 12 (twelve) hours as needed (left shoulder pain).   senna 8.6 MG tablet Commonly known as: SENOKOT Take 2 tablets by mouth at bedtime.   sertraline 100 MG tablet Commonly known as: ZOLOFT Take 2 tablets (200 mg total) by mouth at bedtime.   simethicone 125 MG chewable tablet Commonly known as: MYLICON Chew 035 mg by mouth every 6 (six) hours as needed for flatulence.   sodium chloride 0.65 % Soln nasal spray Commonly known as: OCEAN Place 1 spray into both nostrils as needed for congestion (nose irritation).   tamsulosin 0.4 MG Caps capsule Commonly known as: FLOMAX Take 1 capsule (0.4 mg total) by mouth at bedtime.   torsemide 20 MG tablet Commonly known as: DEMADEX Take 1 tablet (20 mg total) by mouth 2 (two) times daily.        Follow-up Information     Reymundo Poll, MD Follow up in 1 week(s).   Specialty: Family Medicine Contact information: [address] [place] [address] [phone]                 Signed: Sharen Hones 07/19/2021, 8:54 AM

## 2021-07-19 NOTE — Care Management Important Message (Signed)
Important Message  Patient Details  Name: Shirley Leblanc MRN: [id number] Date of Birth: Aug 02, 1945   Medicare Important Message Given:  Yes     Shelda Altes 07/19/2021, 8:01 AM

## 2021-08-02 ENCOUNTER — Emergency Department (HOSPITAL_COMMUNITY)
Admission: EM | Admit: 2021-08-02 | Discharge: 2021-08-03 | Disposition: A | Discharge status: Other Acute Inpatient Hospital | Attending: Emergency Medicine | Admitting: Emergency Medicine

## 2021-08-02 DIAGNOSIS — R300 Dysuria: Secondary | ICD-10-CM | POA: Diagnosis present

## 2021-08-02 DIAGNOSIS — R3915 Urgency of urination: Secondary | ICD-10-CM | POA: Diagnosis not present

## 2021-08-02 DIAGNOSIS — R319 Hematuria, unspecified: Secondary | ICD-10-CM | POA: Diagnosis not present

## 2021-08-02 DIAGNOSIS — Z7982 Long term (current) use of aspirin: Secondary | ICD-10-CM | POA: Insufficient documentation

## 2021-08-02 DIAGNOSIS — N3000 Acute cystitis without hematuria: Secondary | ICD-10-CM

## 2021-08-02 DIAGNOSIS — R1030 Lower abdominal pain, unspecified: Secondary | ICD-10-CM | POA: Diagnosis not present

## 2021-08-02 LAB — CBC
HCT: 37.8 % (ref 36.0–46.0)
Hemoglobin: 11.4 g/dL — ABNORMAL LOW (ref 12.0–15.0)
MCH: 27.7 pg (ref 26.0–34.0)
MCHC: 30.2 g/dL (ref 30.0–36.0)
MCV: 91.7 fL (ref 80.0–100.0)
Platelets: 140 10*3/uL — ABNORMAL LOW (ref 150–400)
RBC: 4.12 MIL/uL (ref 3.87–5.11)
RDW: 17.2 % — ABNORMAL HIGH (ref 11.5–15.5)
WBC: 5.8 10*3/uL (ref 4.0–10.5)
nRBC: 0 % (ref 0.0–0.2)

## 2021-08-02 LAB — BASIC METABOLIC PANEL
Anion gap: 5 (ref 5–15)
BUN: 10 mg/dL (ref 8–23)
CO2: 31 mmol/L (ref 22–32)
Calcium: 9.6 mg/dL (ref 8.9–10.3)
Chloride: 100 mmol/L (ref 98–111)
Creatinine, Ser: 0.72 mg/dL (ref 0.44–1.00)
GFR, Estimated: >60 mL/min (ref >60)
Glucose, Bld: 106 mg/dL — ABNORMAL HIGH (ref 70–99)
Potassium: 4.2 mmol/L (ref 3.5–5.1)
Sodium: 136 mmol/L (ref 135–145)

## 2021-08-02 NOTE — ED Provider Notes (Signed)
[facility] EMERGENCY DEPARTMENT Provider Note   CSN: [id number] Arrival date & time: 08/02/21  1646     History  Chief Complaint  Patient presents with   Urinary Tract Infection    Shirley Leblanc is a 76 y.o. female presents to the ED due to dysuria, urinary urgency, and difficulties urinating for the past few weeks.  Patient also endorses hematuria.  Denies fever or chills.  Admits to suprapubic pain.  No vaginal symptoms.  During my initial evaluation, it appears patient is slightly confused however, unsure what patient's baseline is.  Attempted to call living facility with no answer.Denies back pain. No chest pain or shortness of breath. Difficult to obtain HPI. Per triage note, patient has had some nausea and vomiting over the past week.  Level 5 caveat secondary to confusion.     Home Medications Prior to Admission medications   Medication Sig Start Date End Date Taking? Authorizing Provider  acetaminophen (TYLENOL) 325 MG tablet Take 650 mg by mouth every 12 (twelve) hours as needed for mild pain or fever.    [provider]  albuterol (VENTOLIN HFA) 108 (90 Base) MCG/ACT inhaler Inhale 1 puff into the lungs every 4 (four) hours as needed for wheezing or shortness of breath. TAKE 2 PUFFS BY MOUTH EVERY 6 HOURS AS NEEDED FOR WHEEZE OR SHORTNESS OF BREATH Strength: 108 (90 Base) MCG/ACT 07/18/21   Sharen Hones, MD  aspirin 81 MG EC tablet Take 1 tablet (81 mg total) by mouth daily. Swallow whole. 07/18/21   Sharen Hones, MD  atorvastatin (LIPITOR) 10 MG tablet Take 1 tablet (10 mg total) by mouth every morning. 07/18/21   Sharen Hones, MD  blood glucose meter kit and supplies KIT Dispense per insurance preference. Use up to four times daily . E 11.9 09/30/16   Hawks, Christy A, FNP  busPIRone (BUSPAR) 10 MG tablet Take 2 tablets (20 mg total) by mouth 3 (three) times daily. 07/18/21   Sharen Hones, MD  Camphor-Menthol-Methyl Sal (SALONPAS) 3.08-02-08 % [facility]  Apply 1 patch topically every 12 (twelve) hours as needed (left shoulder pain).    [provider]  carvedilol (COREG) 3.125 MG tablet Take 1 tablet (3.125 mg total) by mouth 2 (two) times daily with a meal. 07/18/21   Sharen Hones, MD  diclofenac Sodium (VOLTAREN) 1 % GEL Apply 1 application topically 2 (two) times daily as needed (neck and shoulder pain). 10/13/19   [provider]  divalproex (DEPAKOTE) 125 MG DR tablet Take 1 tablet (125 mg total) by mouth at bedtime. 07/18/21   Sharen Hones, MD  docusate sodium (COLACE) 100 MG capsule Take 1 capsule (100 mg total) by mouth 2 (two) times daily as needed for mild constipation. Patient not taking: Reported on 07/15/2021 04/08/21   Flora Lipps, MD  glucose blood (GLUCOSE METER TEST) test strip Use BID 09/29/16   Evelina Dun A, FNP  hydrocortisone cream 1 % Apply 1 application topically 2 (two) times daily as needed for itching. Preparation H hydrocortisone    [provider]  levothyroxine (SYNTHROID) 88 MCG tablet Take 1 tablet (88 mcg total) by mouth daily before breakfast. 07/18/21   Sharen Hones, MD  loperamide (IMODIUM A-D) 2 MG tablet Take 2 mg by mouth 3 (three) times daily as needed for diarrhea or loose stools.    [provider]  LORazepam (ATIVAN) 0.5 MG tablet Take 1 tablet (0.5 mg total) by mouth 2 (two) times daily as needed for anxiety.  07/18/21   Sharen Hones, MD  Nutritional Supplements (,FEEDING SUPPLEMENT, PROSOURCE PLUS) liquid Take 30 mLs by mouth 2 (two) times daily between meals. Patient not taking: Reported on 07/15/2021 04/08/21   Pokhrel, Corrie Mckusick, MD  ondansetron (ZOFRAN) 8 MG tablet Take 8 mg by mouth every 8 (eight) hours as needed for nausea or vomiting.    [provider]  pantoprazole (PROTONIX) 40 MG tablet Take 1 tablet (40 mg total) by mouth every evening. 1600 07/18/21   Sharen Hones, MD  polyethylene glycol (MIRALAX / GLYCOLAX) 17 g packet Take 17 g by mouth every  morning. 07/18/21   Sharen Hones, MD  PSYLLIUM PO Take 1 capsule by mouth at bedtime. Take with 8 oz of water    [provider]  senna (SENOKOT) 8.6 MG tablet Take 2 tablets by mouth at bedtime.    [provider]  sertraline (ZOLOFT) 100 MG tablet Take 2 tablets (200 mg total) by mouth at bedtime. 07/18/21   Sharen Hones, MD  simethicone (MYLICON) 982 MG chewable tablet Chew 125 mg by mouth every 6 (six) hours as needed for flatulence.     [provider]  sodium chloride (OCEAN) 0.65 % SOLN nasal spray Place 1 spray into both nostrils as needed for congestion (nose irritation). Patient not taking: Reported on 07/15/2021 04/08/21   Flora Lipps, MD  tamsulosin (FLOMAX) 0.4 MG CAPS capsule Take 1 capsule (0.4 mg total) by mouth at bedtime. 07/18/21   Sharen Hones, MD  torsemide (DEMADEX) 20 MG tablet Take 1 tablet (20 mg total) by mouth 2 (two) times daily. 07/18/21   Sharen Hones, MD      Allergies    Iodinated contrast media, Penicillins, Sulfa antibiotics, and Penicillin g    Review of Systems   Review of Systems  Constitutional:  Negative for chills and fever.  Gastrointestinal:  Positive for abdominal pain, nausea and vomiting. Negative for diarrhea.  Genitourinary:  Positive for difficulty urinating, dysuria, frequency, hematuria and urgency.  All other systems reviewed and are negative.  Physical Exam Updated Vital Signs BP (!) 150/81    Pulse 91    Temp 98.2 F (36.8 C) (Oral)    Resp 18    SpO2 95%  Physical Exam Vitals and nursing note reviewed.  Constitutional:      General: She is not in acute distress.    Appearance: She is not ill-appearing.  HENT:     Head: Normocephalic.  Eyes:     Pupils: Pupils are equal, round, and reactive to light.  Cardiovascular:     Rate and Rhythm: Normal rate and regular rhythm.     Pulses: Normal pulses.     Heart sounds: Normal heart sounds. No murmur heard.   No friction rub. No gallop.  Pulmonary:      Effort: Pulmonary effort is normal.     Breath sounds: Normal breath sounds.  Abdominal:     General: Abdomen is flat. There is no distension.     Palpations: Abdomen is soft.     Tenderness: There is abdominal tenderness. There is no guarding or rebound.     Comments: Suprapubic tenderness. No CVA tenderness  Musculoskeletal:        General: Normal range of motion.     Cervical back: Neck supple.  Skin:    General: Skin is warm and dry.  Neurological:     General: No focal deficit present.     Mental Status: She is alert.  Psychiatric:  Mood and Affect: Mood normal.        Behavior: Behavior normal.    ED Results / Procedures / Treatments   Labs (all labs ordered are listed, but only abnormal results are displayed) Labs Reviewed  BASIC METABOLIC PANEL - Abnormal; Notable for the following components:      Result Value   Glucose, Bld 106 (*)    All other components within normal limits  CBC - Abnormal; Notable for the following components:   Hemoglobin 11.4 (*)    RDW 17.2 (*)    Platelets 140 (*)    All other components within normal limits  URINE CULTURE  URINALYSIS, ROUTINE W REFLEX MICROSCOPIC  LIPASE, BLOOD  URINALYSIS, ROUTINE W REFLEX MICROSCOPIC  HEPATIC FUNCTION PANEL  LACTIC ACID, PLASMA  LACTIC ACID, PLASMA    EKG None  Radiology No results found.  Procedures Procedures    Medications Ordered in ED Medications - No data to display  ED Course/ Medical Decision Making/ A&P Clinical Course as of 08/03/21 0000  Fri Aug 02, 2021  2149 Attempted to call living facility with no answer.  [CA]    Clinical Course User Index [CA] Suzy Bouchard, PA-C                           Medical Decision Making 76 year old female presents to the ED due to dysuria and urinary frequency for the past few weeks.  Patient also endorses suprapubic pain.  Upon arrival, stable vitals.  Patient in no acute distress.  Physical exam significant for mild suprapubic  tenderness.  Labs ordered at triage.  Will hold off on CT abdomen given my suspicion for acute abdomen is low and patient has a contrast allergy.  Will continue to attempt to call living facility for collateral information.   This patient presents to the ED for concern of dysuria, this involves an extensive number of treatment options, and is a complaint that carries with it a high risk of complications and morbidity.  The differential diagnosis includes UTI, pyelonephritis, acute abdomen  CBC with no evidence of leukocytosis.  Mild anemia with hemoglobin at 11.4. BMP with normal renal function. No major electrolyte abnormalities.   Patient handed off to Dr. Betsey Holiday at shift change pending labs and UA. At shift change, still unable to reach living facility after numerous attempts for collateral information.         Final Clinical Impression(s) / ED Diagnoses Final diagnoses:  Dysuria    Rx / DC Orders ED Discharge Orders     None         Suzy Bouchard, PA-C 08/03/21 0000    Pattricia Boss, MD 08/03/21 1747

## 2021-08-02 NOTE — ED Triage Notes (Signed)
Pt here via EMS d/t possible UTI. Painful urination, urgency to urinate and difficult to stop. X1 month.   140/80 P92 RR 20 93% RA CBG 154

## 2021-08-02 NOTE — ED Provider Triage Note (Signed)
Emergency Medicine Provider Triage Evaluation Note  Shirley Leblanc , a 76 y.o. female  was evaluated in triage.  Pt complains of dysuria.  Patient states that for 2 weeks she has had urgency, dysuria and decreased urine output.  She states that she has had associated abdominal pain and subjective fevers.  She states that she has had nausea and vomiting for the past week.  Denies diarrhea.  Denies flank pain.  Review of Systems  Positive: See above Negative:   Physical Exam  BP 121/71 (BP Location: Left Arm)    Pulse (!) 54    Temp 97.9 F (36.6 C) (Oral)    Resp 18    SpO2 91%  Gen:   Awake, no distress   Resp:  Normal effort  MSK:   Moves extremities without difficulty  Other:  Abdomen is rounded, soft and nontender to palpation.  Medical Decision Making  Medically screening exam initiated at 6:05 PM.  Appropriate orders placed.  LORAL CAMPI was informed that the remainder of the evaluation will be completed by another provider, this initial triage assessment does not replace that evaluation, and the importance of remaining in the ED until their evaluation is complete.     Mickie Hillier, PA-C 08/02/21 1806

## 2021-08-03 LAB — HEPATIC FUNCTION PANEL
ALT: 12 U/L (ref 0–44)
AST: 17 U/L (ref 15–41)
Albumin: 3.7 g/dL (ref 3.5–5.0)
Alkaline Phosphatase: 91 U/L (ref 38–126)
Bilirubin, Direct: 0.3 mg/dL — ABNORMAL HIGH (ref 0.0–0.2)
Indirect Bilirubin: 0.7 mg/dL (ref 0.3–0.9)
Total Bilirubin: 1 mg/dL (ref 0.3–1.2)
Total Protein: 6.9 g/dL (ref 6.5–8.1)

## 2021-08-03 LAB — URINALYSIS, ROUTINE W REFLEX MICROSCOPIC
Bilirubin Urine: NEGATIVE
Glucose, UA: NEGATIVE mg/dL
Ketones, ur: 15 mg/dL — AB
Nitrite: NEGATIVE
Protein, ur: 100 mg/dL — AB
Specific Gravity, Urine: 1.02 (ref 1.005–1.030)
pH: 6 (ref 5.0–8.0)

## 2021-08-03 LAB — URINALYSIS, MICROSCOPIC (REFLEX)

## 2021-08-03 LAB — LACTIC ACID, PLASMA: Lactic Acid, Venous: 1.1 mmol/L (ref 0.5–1.9)

## 2021-08-03 LAB — LIPASE, BLOOD: Lipase: 28 U/L (ref 11–51)

## 2021-08-03 MED ORDER — FOSFOMYCIN TROMETHAMINE 3 G PO PACK
3.0000 g | PACK | Freq: Once | ORAL | Status: AC
  Administered 2021-08-03: 3 g via ORAL
  Filled 2021-08-03: qty 3

## 2021-08-03 NOTE — ED Notes (Signed)
[facility] called for report. Calls rolled over to voicemail. This RN left detailed message and call back for facility RN.

## 2021-08-03 NOTE — ED Notes (Addendum)
Pt O2 sat decrease to upper 80's while sleeping. 2 L [place] applied. 02 sat now 97%

## 2021-08-03 NOTE — ED Notes (Signed)
Patient verbalizes understanding of discharge instructions. Opportunity for questioning and answers were provided. Armband removed by staff, pt discharged from ED via [facility] To return to [facility]

## 2021-08-05 LAB — URINE CULTURE: Culture: 70000 — AB

## 2021-08-06 ENCOUNTER — Telehealth: Payer: Self-pay

## 2021-08-06 NOTE — Telephone Encounter (Signed)
Post ED Visit - Positive Culture Follow-up  Culture report reviewed by antimicrobial stewardship pharmacist: [place] Team []  Nathan Batchelder, Pharm.D. [x]  [address], Pharm.D., BCPS AQ-ID []  Heide Guile, Pharm.D., BCPS []  Parks Neptune, Pharm.D., BCPS []  [place], Pharm.D., BCPS, AAHIVP []  [place], Pharm.D., BCPS, AAHIVP []  Legrand Como, PharmD, BCPS []  Salome Arnt, PharmD, BCPS []  Johnnette Gourd, PharmD, BCPS []  Hughes Better, PharmD []  Leeroy Cha, PharmD, BCPS []  Laqueta Linden, PharmD  [place] Team []  [address], PharmD []  Leodis Sias, PharmD []  Lindell Spar, PharmD []  Royetta Asal, Rph []  Graylin Shiver) Rema Fendt, PharmD []  Glennon Mac, PharmD []  Arlyn Dunning, PharmD []  Netta Cedars, PharmD []  Dia Sitter, PharmD []  Leone Haven, PharmD []  Gretta Arab, PharmD []  Theodis Shove, PharmD []  Peggyann Juba, PharmD   Positive urine culture Treated with Fosfomycin, organism sensitive to the same and no further patient follow-up is required at this time.  Reuel Boom 08/06/2021, 10:08 AM

## 2022-06-20 ENCOUNTER — Emergency Department (HOSPITAL_COMMUNITY)
Admission: EM | Admit: 2022-06-20 | Discharge: 2022-06-20 | Disposition: A | Discharge status: Home / Self Care | Attending: Emergency Medicine | Admitting: Emergency Medicine

## 2022-06-20 DIAGNOSIS — R4182 Altered mental status, unspecified: Secondary | ICD-10-CM | POA: Diagnosis present

## 2022-06-20 DIAGNOSIS — T402X5A Adverse effect of other opioids, initial encounter: Secondary | ICD-10-CM | POA: Insufficient documentation

## 2022-06-20 DIAGNOSIS — T50905A Adverse effect of unspecified drugs, medicaments and biological substances, initial encounter: Secondary | ICD-10-CM

## 2022-06-20 DIAGNOSIS — Z515 Encounter for palliative care: Secondary | ICD-10-CM

## 2022-06-20 DIAGNOSIS — Z7982 Long term (current) use of aspirin: Secondary | ICD-10-CM | POA: Diagnosis not present

## 2022-06-20 DIAGNOSIS — N390 Urinary tract infection, site not specified: Secondary | ICD-10-CM | POA: Insufficient documentation

## 2022-06-20 DIAGNOSIS — D72829 Elevated white blood cell count, unspecified: Secondary | ICD-10-CM | POA: Insufficient documentation

## 2022-06-20 LAB — URINALYSIS, ROUTINE W REFLEX MICROSCOPIC
Bacteria, UA: NONE SEEN
Bilirubin Urine: NEGATIVE
Glucose, UA: NEGATIVE mg/dL
Hgb urine dipstick: NEGATIVE
Ketones, ur: NEGATIVE mg/dL
Nitrite: NEGATIVE
Protein, ur: NEGATIVE mg/dL
Specific Gravity, Urine: 1.013 (ref 1.005–1.030)
pH: 5 (ref 5.0–8.0)

## 2022-06-20 LAB — CBG MONITORING, ED: Glucose-Capillary: 89 mg/dL (ref 70–99)

## 2022-06-20 MED ORDER — FOSFOMYCIN TROMETHAMINE 3 G PO PACK
3.0000 g | PACK | Freq: Once | ORAL | Status: AC
  Administered 2022-06-20: 3 g via ORAL
  Filled 2022-06-20: qty 3

## 2022-06-20 NOTE — ED Notes (Signed)
[facility] notified for transport 

## 2022-06-20 NOTE — ED Triage Notes (Signed)
Pt BIB [facility] from [facility]. Pt morphine changed to '20mg'$  every 4 hours. Change occurred on Tuesday. Pt had been confused and lethargic every since the change.  100/60 BP 118 CBG 50 Hr 18 RR O2sats 87 RA pty placed 4 L [place] sats 98.

## 2022-06-20 NOTE — ED Provider Notes (Signed)
[facility] DEPT Provider Note   CSN: [id number] Arrival date & time: 06/20/22  0024     History  Chief Complaint  Patient presents with   Altered Mental Status    Shirley Leblanc is a 76 y.o. female.  The history is provided by the EMS personnel.  Altered Mental Status Most recent episode:  Today Episode history:  Single Timing:  Constant Progression:  Unchanged Chronicity:  New Context: nursing home resident   Context comment:  Patient is on oxygen at nursing home that she has not been wearing Associated symptoms: no fever, no rash and no vomiting   Patient is a hospice patient at a nursing home who had her morphine increased to 20 mg every 4 hours this week and has been off her oxygen who had low oxygen (not on her baseline oxygen) and increased lethargy.  Hospice wanted patient evaluated.  Patient is currently awake, alert with normal vital signs on oxygen and conversant.  She is denying any and all complaints.       Home Medications Prior to Admission medications   Medication Sig Start Date End Date Taking? Authorizing Provider  acetaminophen (TYLENOL) 325 MG tablet Take 650 mg by mouth every 12 (twelve) hours as needed for mild pain or fever.    [provider]  albuterol (VENTOLIN HFA) 108 (90 Base) MCG/ACT inhaler Inhale 1 puff into the lungs every 4 (four) hours as needed for wheezing or shortness of breath. TAKE 2 PUFFS BY MOUTH EVERY 6 HOURS AS NEEDED FOR WHEEZE OR SHORTNESS OF BREATH Strength: 108 (90 Base) MCG/ACT Patient taking differently: Inhale 2 puffs into the lungs 4 (four) times daily. 07/18/21   Sharen Hones, MD  aspirin 81 MG EC tablet Take 1 tablet (81 mg total) by mouth daily. Swallow whole. Patient not taking: Reported on 08/03/2021 07/18/21   Sharen Hones, MD  atorvastatin (LIPITOR) 10 MG tablet Take 1 tablet (10 mg total) by mouth every morning. 07/18/21   Sharen Hones, MD  blood glucose meter kit and supplies KIT  Dispense per insurance preference. Use up to four times daily . E 11.9 09/30/16   Hawks, Christy A, FNP  busPIRone (BUSPAR) 10 MG tablet Take 2 tablets (20 mg total) by mouth 3 (three) times daily. 07/18/21   Sharen Hones, MD  Camphor-Menthol-Methyl Sal (SALONPAS) 3.08-02-08 % PTCH Apply 1 patch topically every 12 (twelve) hours as needed (pain).    [provider]  carvedilol (COREG) 3.125 MG tablet Take 1 tablet (3.125 mg total) by mouth 2 (two) times daily with a meal. Patient not taking: Reported on 08/03/2021 07/18/21   Sharen Hones, MD  diclofenac Sodium (VOLTAREN) 1 % GEL Apply 1 application topically 2 (two) times daily as needed (neck and shoulder pain). 10/13/19   [provider]  divalproex (DEPAKOTE) 125 MG DR tablet Take 1 tablet (125 mg total) by mouth at bedtime. 07/18/21   Sharen Hones, MD  docusate sodium (COLACE) 100 MG capsule Take 1 capsule (100 mg total) by mouth 2 (two) times daily as needed for mild constipation. Patient not taking: Reported on 07/15/2021 04/08/21   Flora Lipps, MD  glucose blood (GLUCOSE METER TEST) test strip Use BID 09/29/16   Evelina Dun A, FNP  hydrocortisone cream 1 % Apply 1 application topically 2 (two) times daily as needed for itching. Preparation H hydrocortisone    [provider]  levothyroxine (SYNTHROID) 88 MCG tablet Take 1 tablet (88 mcg total) by mouth daily  before breakfast. 07/18/21   Sharen Hones, MD  loperamide (IMODIUM A-D) 2 MG tablet Take 2 mg by mouth as needed for diarrhea or loose stools.    [provider]  LORazepam (ATIVAN) 0.5 MG tablet Take 1 tablet (0.5 mg total) by mouth 2 (two) times daily as needed for anxiety. Patient taking differently: Take 0.5 mg by mouth 2 (two) times daily. 07/18/21   Sharen Hones, MD  Nutritional Supplements (,FEEDING SUPPLEMENT, PROSOURCE PLUS) liquid Take 30 mLs by mouth 2 (two) times daily between meals. Patient not taking: Reported on 07/15/2021 04/08/21   Pokhrel,  Corrie Mckusick, MD  ondansetron (ZOFRAN) 8 MG tablet Take 8 mg by mouth every 8 (eight) hours as needed for nausea or vomiting.    [provider]  oxyCODONE (OXY IR/ROXICODONE) 5 MG immediate release tablet Take 5 mg by mouth every 2 (two) hours as needed for moderate pain.    [provider]  oxyCODONE-acetaminophen (PERCOCET) 10-325 MG tablet Take 1 tablet by mouth every 4 (four) hours.    [provider]  OXYGEN Inhale 2 L into the lungs continuous.    [provider]  pantoprazole (PROTONIX) 40 MG tablet Take 1 tablet (40 mg total) by mouth every evening. 1600 07/18/21   Sharen Hones, MD  polyethylene glycol (MIRALAX / GLYCOLAX) 17 g packet Take 17 g by mouth every morning. 07/18/21   Sharen Hones, MD  PSYLLIUM PO Take 1 capsule by mouth at bedtime. Take with 8 oz of water    [provider]  senna (SENOKOT) 8.6 MG tablet Take 2 tablets by mouth at bedtime.    [provider]  sertraline (ZOLOFT) 100 MG tablet Take 2 tablets (200 mg total) by mouth at bedtime. 07/18/21   Sharen Hones, MD  simethicone (MYLICON) 778 MG chewable tablet Chew 125 mg by mouth every 6 (six) hours as needed for flatulence.     [provider]  sodium chloride (OCEAN) 0.65 % SOLN nasal spray Place 1 spray into both nostrils as needed for congestion (nose irritation). Patient not taking: Reported on 07/15/2021 04/08/21   Flora Lipps, MD  tamsulosin (FLOMAX) 0.4 MG CAPS capsule Take 1 capsule (0.4 mg total) by mouth at bedtime. 07/18/21   Sharen Hones, MD  torsemide (DEMADEX) 20 MG tablet Take 1 tablet (20 mg total) by mouth 2 (two) times daily. Patient not taking: Reported on 08/03/2021 07/18/21   Sharen Hones, MD      Allergies    Iodinated contrast media, Penicillins, Sulfa antibiotics, and Penicillin g    Review of Systems   Review of Systems  Constitutional:  Negative for fever.  HENT:  Negative for facial swelling.   Respiratory:  Negative for cough,  wheezing and stridor.   Gastrointestinal:  Negative for vomiting.  Musculoskeletal:  Negative for neck stiffness.  Skin:  Negative for rash.    Physical Exam Updated Vital Signs BP 98/82   Pulse 98   Temp 97.7 F (36.5 C) (Oral)   Resp 16   SpO2 98%  Physical Exam Vitals and nursing note reviewed. Exam conducted with a chaperone present.  Constitutional:      General: She is not in acute distress.    Appearance: She is well-developed.  HENT:     Head: Normocephalic and atraumatic.     Nose: Nose normal.     Mouth/Throat:     Mouth: Mucous membranes are moist.  Eyes:     Pupils: Pupils are equal, round, and reactive  to light.  Cardiovascular:     Rate and Rhythm: Normal rate and regular rhythm.     Pulses: Normal pulses.     Heart sounds: Normal heart sounds.  Pulmonary:     Effort: Pulmonary effort is normal. No respiratory distress.     Breath sounds: Normal breath sounds. No wheezing, rhonchi or rales.  Abdominal:     General: Bowel sounds are normal. There is no distension.     Palpations: Abdomen is soft.     Tenderness: There is no abdominal tenderness. There is no guarding or rebound.  Genitourinary:    Vagina: No vaginal discharge.  Musculoskeletal:        General: Normal range of motion.     Cervical back: Neck supple.     Right lower leg: No edema.     Left lower leg: No edema.  Skin:    General: Skin is warm and dry.     Capillary Refill: Capillary refill takes less than 2 seconds.     Findings: No erythema or rash.  Neurological:     General: No focal deficit present.     Mental Status: She is oriented to person, place, and time.     Deep Tendon Reflexes: Reflexes normal.  Psychiatric:        Mood and Affect: Mood normal.        Behavior: Behavior normal.     ED Results / Procedures / Treatments   Labs (all labs ordered are listed, but only abnormal results are displayed) Labs Reviewed  URINALYSIS, ROUTINE W REFLEX MICROSCOPIC - Abnormal;  Notable for the following components:      Result Value   Leukocytes,Ua MODERATE (*)    All other components within normal limits  URINE CULTURE  CBG MONITORING, ED    EKG None  Radiology No results found.  Procedures Procedures    Medications Ordered in ED Medications  fosfomycin (MONUROL) packet 3 g (has no administration in time range)    ED Course/ Medical Decision Making/ A&P                           Medical Decision Making Hospice patient who had her morphine increased significantly this week and has not been on her O2 at the facility sent in  Amount and/or Complexity of Data Reviewed Independent Historian: EMS    Details: See above  External Data Reviewed: notes.    Details: Previous notes reviewed  Labs: ordered.    Details: Very mild UTI  Risk Prescription drug management. Risk Details: I have discussed the case with on call hospice nurse.  I have offered to give narcan, though patient's mentation is appropriate.  Hospice did not want this.  This is not sepsis.  The patient is awake and alert.  I have counseled hospice that this is likely secondary to the marked increase in morphine and the lack of use of the patient's oxygen.  She has been observed and has been treated in the ED and is stable for discharge back to hospice.      Final Clinical Impression(s) / ED Diagnoses Final diagnoses:  Hospice care patient  Adverse effect of drug, initial encounter  Urinary tract infection without hematuria, site unspecified   Return for intractable cough, coughing up blood, fevers > 100.4 unrelieved by medication, shortness of breath, intractable vomiting, chest pain, shortness of breath, weakness, numbness, changes in speech, facial asymmetry, abdominal pain, passing out, Inability to  tolerate liquids or food, cough, altered mental status or any concerns. No signs of systemic illness or infection. The patient is nontoxic-appearing on exam and vital signs are within  normal limits.  I have reviewed the triage vital signs and the nursing notes. Pertinent labs & imaging results that were available during my care of the patient were reviewed by me and considered in my medical decision making (see chart for details). After history, exam, and medical workup I feel the patient has been appropriately medically screened and is safe for discharge home. Pertinent diagnoses were discussed with the patient. Patient was given return precautions.    Rx / DC Orders ED Discharge Orders     None         Andreu Drudge, MD 06/20/22 [phone]

## 2022-06-21 LAB — URINE CULTURE
Culture: <10000 — AB
Special Requests: NORMAL

## 2022-06-27 ENCOUNTER — Emergency Department (HOSPITAL_COMMUNITY)
Admission: EM | Admit: 2022-06-27 | Discharge: 2022-06-27 | Disposition: A | Discharge status: Skilled Nursing Facility | Attending: Emergency Medicine | Admitting: Emergency Medicine

## 2022-06-27 ENCOUNTER — Other Ambulatory Visit: Payer: Self-pay

## 2022-06-27 ENCOUNTER — Emergency Department (HOSPITAL_COMMUNITY)

## 2022-06-27 DIAGNOSIS — I2510 Atherosclerotic heart disease of native coronary artery without angina pectoris: Secondary | ICD-10-CM | POA: Diagnosis not present

## 2022-06-27 DIAGNOSIS — I509 Heart failure, unspecified: Secondary | ICD-10-CM | POA: Insufficient documentation

## 2022-06-27 DIAGNOSIS — Z515 Encounter for palliative care: Secondary | ICD-10-CM | POA: Diagnosis not present

## 2022-06-27 DIAGNOSIS — I110 Hypertensive heart disease with heart failure: Secondary | ICD-10-CM | POA: Diagnosis not present

## 2022-06-27 DIAGNOSIS — R110 Nausea: Secondary | ICD-10-CM | POA: Insufficient documentation

## 2022-06-27 DIAGNOSIS — J449 Chronic obstructive pulmonary disease, unspecified: Secondary | ICD-10-CM | POA: Insufficient documentation

## 2022-06-27 DIAGNOSIS — Z79899 Other long term (current) drug therapy: Secondary | ICD-10-CM | POA: Insufficient documentation

## 2022-06-27 DIAGNOSIS — Z7982 Long term (current) use of aspirin: Secondary | ICD-10-CM | POA: Insufficient documentation

## 2022-06-27 LAB — COMPREHENSIVE METABOLIC PANEL
ALT: 14 U/L (ref 0–44)
AST: 21 U/L (ref 15–41)
Albumin: 3.2 g/dL — ABNORMAL LOW (ref 3.5–5.0)
Alkaline Phosphatase: 74 U/L (ref 38–126)
Anion gap: 9 (ref 5–15)
BUN: 9 mg/dL (ref 8–23)
CO2: 29 mmol/L (ref 22–32)
Calcium: 9.6 mg/dL (ref 8.9–10.3)
Chloride: 99 mmol/L (ref 98–111)
Creatinine, Ser: 0.75 mg/dL (ref 0.44–1.00)
GFR, Estimated: >60 mL/min (ref >60)
Glucose, Bld: 121 mg/dL — ABNORMAL HIGH (ref 70–99)
Potassium: 3.4 mmol/L — ABNORMAL LOW (ref 3.5–5.1)
Sodium: 137 mmol/L (ref 135–145)
Total Bilirubin: 1.5 mg/dL — ABNORMAL HIGH (ref 0.3–1.2)
Total Protein: 6.5 g/dL (ref 6.5–8.1)

## 2022-06-27 LAB — CBC WITH DIFFERENTIAL/PLATELET
Abs Immature Granulocytes: 0.06 10*3/uL (ref 0.00–0.07)
Basophils Absolute: 0 10*3/uL (ref 0.0–0.1)
Basophils Relative: 0 %
Eosinophils Absolute: 0.1 10*3/uL (ref 0.0–0.5)
Eosinophils Relative: 1 %
HCT: 40.1 % (ref 36.0–46.0)
Hemoglobin: 13.2 g/dL (ref 12.0–15.0)
Immature Granulocytes: 1 %
Lymphocytes Relative: 12 %
Lymphs Abs: 1 10*3/uL (ref 0.7–4.0)
MCH: 31.1 pg (ref 26.0–34.0)
MCHC: 32.9 g/dL (ref 30.0–36.0)
MCV: 94.4 fL (ref 80.0–100.0)
Monocytes Absolute: 0.6 10*3/uL (ref 0.1–1.0)
Monocytes Relative: 7 %
Neutro Abs: 6.6 10*3/uL (ref 1.7–7.7)
Neutrophils Relative %: 79 %
Platelets: 141 10*3/uL — ABNORMAL LOW (ref 150–400)
RBC: 4.25 MIL/uL (ref 3.87–5.11)
RDW: 13.7 % (ref 11.5–15.5)
WBC: 8.3 10*3/uL (ref 4.0–10.5)
nRBC: 0 % (ref 0.0–0.2)

## 2022-06-27 LAB — URINALYSIS, ROUTINE W REFLEX MICROSCOPIC
Bilirubin Urine: NEGATIVE
Glucose, UA: NEGATIVE mg/dL
Hgb urine dipstick: NEGATIVE
Ketones, ur: NEGATIVE mg/dL
Leukocytes,Ua: NEGATIVE
Nitrite: NEGATIVE
Protein, ur: 30 mg/dL — AB
Specific Gravity, Urine: 1.011 (ref 1.005–1.030)
pH: 8 (ref 5.0–8.0)

## 2022-06-27 MED ORDER — ONDANSETRON 4 MG PO TBDP: 4.0000 mg | ORAL_TABLET | Freq: Three times a day (TID) | ORAL | 0 refills | Status: DC | PRN

## 2022-06-27 MED ORDER — ONDANSETRON HCL 4 MG/2ML IJ SOLN
4.0000 mg | Freq: Once | INTRAMUSCULAR | Status: AC
  Administered 2022-06-27: 4 mg via INTRAVENOUS
  Filled 2022-06-27: qty 2

## 2022-06-27 NOTE — Progress Notes (Signed)
Manufacturing engineer [facility]) Hospital Liaison Note  This is a current [facility] hospice patient with a hospice diagnosis of Hypertensive heart disease with diastolic heart failure. Please call with any questions or concerns. Thank you  Roselee Nova, [facility] Liaison [phone]

## 2022-06-27 NOTE — ED Notes (Signed)
Attempted to call [facility] for report, transferred with no answer from nurse

## 2022-06-27 NOTE — Discharge Instructions (Addendum)
Please feed patient soft foods.

## 2022-06-27 NOTE — ED Triage Notes (Signed)
BIB [facility] from [facility] care unit for morphine withdrawal due to saying people want to kill her. last dose of morphine was midnight, pt reports refusing 4am dose due to last dose making her nauseated. Pt reports decreased appetite due not being provided soft foods and unable to eat he foods they provide.  Pt is hospice/DNR

## 2022-06-27 NOTE — ED Provider Notes (Signed)
[facility] DEPT Provider Note   CSN: [id number] Arrival date & time: 06/27/22  1019     History  Chief Complaint  Patient presents with   Nausea    Shirley Leblanc is a 76 y.o. female.  Pt is a 76 yo female with a pmhx significant for djd, hld, depression, gerd, htn, cad, copd and afib (not on thinners), and chf.  Pt is on hospice and was in the ED on 11/24 for being altered due to too much morphine, so her dose was decreased.  Pt did not take her dose earlier today because it's been making her nauseous.  She said she's not been able to eat what the facility has been giving her because of the nausea.  She said she asked for something soft, but nobody brought it.  EMS said facility said she was confused.       Home Medications Prior to Admission medications   Medication Sig Start Date End Date Taking? Authorizing Provider  ondansetron (ZOFRAN-ODT) 4 MG disintegrating tablet Take 1 tablet (4 mg total) by mouth every 8 (eight) hours as needed. 06/27/22  Yes Isla Pence, MD  acetaminophen (TYLENOL) 325 MG tablet Take 650 mg by mouth every 12 (twelve) hours as needed for mild pain or fever.    [provider]  albuterol (VENTOLIN HFA) 108 (90 Base) MCG/ACT inhaler Inhale 1 puff into the lungs every 4 (four) hours as needed for wheezing or shortness of breath. TAKE 2 PUFFS BY MOUTH EVERY 6 HOURS AS NEEDED FOR WHEEZE OR SHORTNESS OF BREATH Strength: 108 (90 Base) MCG/ACT Patient taking differently: Inhale 2 puffs into the lungs 4 (four) times daily. 07/18/21   Sharen Hones, MD  aspirin 81 MG EC tablet Take 1 tablet (81 mg total) by mouth daily. Swallow whole. Patient not taking: Reported on 08/03/2021 07/18/21   Sharen Hones, MD  atorvastatin (LIPITOR) 10 MG tablet Take 1 tablet (10 mg total) by mouth every morning. 07/18/21   Sharen Hones, MD  blood glucose meter kit and supplies KIT Dispense per insurance preference. Use up to four times daily . E  11.9 09/30/16   Hawks, Christy A, FNP  busPIRone (BUSPAR) 10 MG tablet Take 2 tablets (20 mg total) by mouth 3 (three) times daily. 07/18/21   Sharen Hones, MD  Camphor-Menthol-Methyl Sal (SALONPAS) 3.08-02-08 % PTCH Apply 1 patch topically every 12 (twelve) hours as needed (pain).    [provider]  carvedilol (COREG) 3.125 MG tablet Take 1 tablet (3.125 mg total) by mouth 2 (two) times daily with a meal. Patient not taking: Reported on 08/03/2021 07/18/21   Sharen Hones, MD  diclofenac Sodium (VOLTAREN) 1 % GEL Apply 1 application topically 2 (two) times daily as needed (neck and shoulder pain). 10/13/19   [provider]  divalproex (DEPAKOTE) 125 MG DR tablet Take 1 tablet (125 mg total) by mouth at bedtime. 07/18/21   Sharen Hones, MD  docusate sodium (COLACE) 100 MG capsule Take 1 capsule (100 mg total) by mouth 2 (two) times daily as needed for mild constipation. Patient not taking: Reported on 07/15/2021 04/08/21   Flora Lipps, MD  glucose blood (GLUCOSE METER TEST) test strip Use BID 09/29/16   Evelina Dun A, FNP  hydrocortisone cream 1 % Apply 1 application topically 2 (two) times daily as needed for itching. Preparation H hydrocortisone    [provider]  levothyroxine (SYNTHROID) 88 MCG tablet Take 1 tablet (88 mcg total) by mouth daily  before breakfast. 07/18/21   Sharen Hones, MD  loperamide (IMODIUM A-D) 2 MG tablet Take 2 mg by mouth as needed for diarrhea or loose stools.    [provider]  LORazepam (ATIVAN) 0.5 MG tablet Take 1 tablet (0.5 mg total) by mouth 2 (two) times daily as needed for anxiety. Patient taking differently: Take 0.5 mg by mouth 2 (two) times daily. 07/18/21   Sharen Hones, MD  Nutritional Supplements (,FEEDING SUPPLEMENT, PROSOURCE PLUS) liquid Take 30 mLs by mouth 2 (two) times daily between meals. Patient not taking: Reported on 07/15/2021 04/08/21   Pokhrel, Corrie Mckusick, MD  ondansetron (ZOFRAN) 8 MG tablet Take 8 mg by mouth  every 8 (eight) hours as needed for nausea or vomiting.    [provider]  oxyCODONE (OXY IR/ROXICODONE) 5 MG immediate release tablet Take 5 mg by mouth every 2 (two) hours as needed for moderate pain.    [provider]  oxyCODONE-acetaminophen (PERCOCET) 10-325 MG tablet Take 1 tablet by mouth every 4 (four) hours.    [provider]  OXYGEN Inhale 2 L into the lungs continuous.    [provider]  pantoprazole (PROTONIX) 40 MG tablet Take 1 tablet (40 mg total) by mouth every evening. 1600 07/18/21   Sharen Hones, MD  polyethylene glycol (MIRALAX / GLYCOLAX) 17 g packet Take 17 g by mouth every morning. 07/18/21   Sharen Hones, MD  PSYLLIUM PO Take 1 capsule by mouth at bedtime. Take with 8 oz of water    [provider]  senna (SENOKOT) 8.6 MG tablet Take 2 tablets by mouth at bedtime.    [provider]  sertraline (ZOLOFT) 100 MG tablet Take 2 tablets (200 mg total) by mouth at bedtime. 07/18/21   Sharen Hones, MD  simethicone (MYLICON) 932 MG chewable tablet Chew 125 mg by mouth every 6 (six) hours as needed for flatulence.     [provider]  sodium chloride (OCEAN) 0.65 % SOLN nasal spray Place 1 spray into both nostrils as needed for congestion (nose irritation). Patient not taking: Reported on 07/15/2021 04/08/21   Flora Lipps, MD  tamsulosin (FLOMAX) 0.4 MG CAPS capsule Take 1 capsule (0.4 mg total) by mouth at bedtime. 07/18/21   Sharen Hones, MD  torsemide (DEMADEX) 20 MG tablet Take 1 tablet (20 mg total) by mouth 2 (two) times daily. Patient not taking: Reported on 08/03/2021 07/18/21   Sharen Hones, MD      Allergies    Iodinated contrast media, Penicillins, Sulfa antibiotics, and Penicillin g    Review of Systems   Review of Systems  Gastrointestinal:  Positive for nausea.  All other systems reviewed and are negative.   Physical Exam Updated Vital Signs BP (!) 180/91 (BP Location: Right Arm)   Pulse 61    Temp 98 F (36.7 C) (Oral)   Resp 20   Ht _0  (1.651 m)   Wt 123 kg   SpO2 90%   BMI 45.12 kg/m  Physical Exam Vitals and nursing note reviewed.  Constitutional:      Appearance: Normal appearance. She is obese.  HENT:     Head: Normocephalic and atraumatic.     Right Ear: External ear normal.     Left Ear: External ear normal.     Nose: Nose normal.     Mouth/Throat:     Mouth: Mucous membranes are dry.  Eyes:     Extraocular Movements: Extraocular movements intact.     Conjunctiva/sclera: Conjunctivae  normal.     Pupils: Pupils are equal, round, and reactive to light.  Cardiovascular:     Rate and Rhythm: Normal rate and regular rhythm.     Pulses: Normal pulses.     Heart sounds: Normal heart sounds.  Pulmonary:     Effort: Pulmonary effort is normal.     Breath sounds: Normal breath sounds.  Abdominal:     General: Abdomen is flat. Bowel sounds are normal.     Palpations: Abdomen is soft.  Musculoskeletal:        General: Normal range of motion.     Cervical back: Normal range of motion and neck supple.  Skin:    General: Skin is warm.     Capillary Refill: Capillary refill takes less than 2 seconds.  Neurological:     General: No focal deficit present.     Mental Status: She is alert and oriented to person, place, and time.  Psychiatric:        Mood and Affect: Mood normal.        Behavior: Behavior normal.     ED Results / Procedures / Treatments   Labs (all labs ordered are listed, but only abnormal results are displayed) Labs Reviewed  COMPREHENSIVE METABOLIC PANEL - Abnormal; Notable for the following components:      Result Value   Potassium 3.4 (*)    Glucose, Bld 121 (*)    Albumin 3.2 (*)    Total Bilirubin 1.5 (*)    All other components within normal limits  CBC WITH DIFFERENTIAL/PLATELET - Abnormal; Notable for the following components:   Platelets 141 (*)    All other components within normal limits  URINALYSIS, ROUTINE W REFLEX  MICROSCOPIC - Abnormal; Notable for the following components:   Protein, ur 30 (*)    Bacteria, UA RARE (*)    All other components within normal limits    EKG EKG Interpretation  Date/Time:  Friday June 27 2022 10:28:31 EST Ventricular Rate:  64 PR Interval:    QRS Duration: 134 QT Interval:  480 QTC Calculation: 496 R Axis:   -59 Text Interpretation: Atrial fibrillation Left bundle branch block No significant change since last tracing Confirmed by Isla Pence [phone]) on 06/27/2022 10:32:57 AM  Radiology DG Chest Portable 1 View  Result Date: 06/27/2022 CLINICAL DATA:  Nausea EXAM: PORTABLE CHEST 1 VIEW COMPARISON:  CT Chest 09/20/20 FINDINGS: Low lung volumes. Partially visualized cervical spinal fusion hardware in place. Spinal nerve stimulator in place. There are prominent bilateral interstitial opacities which may represent changes related to a combination of low lung volumes and mild pulmonary edema. Visualized upper abdomen is unremarkable. No displaced rib fractures. IMPRESSION: Low lung volumes with possible superimposed mild pulmonary edema versus atypical infection. Electronically Signed   By: Marin Roberts M.D.   On: 06/27/2022 11:35    Procedures Procedures    Medications Ordered in ED Medications  ondansetron (ZOFRAN) injection 4 mg (4 mg Intravenous Given 06/27/22 1053)    ED Course/ Medical Decision Making/ A&P                           Medical Decision Making Amount and/or Complexity of Data Reviewed Labs: ordered. Radiology: ordered.  Risk Prescription drug management.   This patient presents to the ED for concern of nausea, this involves an extensive number of treatment options, and is a complaint that carries with it a high risk of complications and morbidity.  The differential diagnosis includes infection, electrolyte abn   Co morbidities that complicate the patient evaluation  djd, hld, depression, gerd, htn, cad, copd and afib (not on  thinners), and chf   Additional history obtained:  Additional history obtained from epic chart review External records from outside source obtained and reviewed including EMS report   Lab Tests:  I Ordered, and personally interpreted labs.  The pertinent results include:  cbc nl, cmp nl, UA nl   Imaging Studies ordered:  I ordered imaging studies including cxr  I independently visualized and interpreted imaging which showed  Low lung volumes with possible superimposed mild pulmonary edema  versus atypical infection.   I agree with the radiologist interpretation   Cardiac Monitoring:  The patient was maintained on a cardiac monitor.  I personally viewed and interpreted the cardiac monitored which showed an underlying rhythm of: afib   Medicines ordered and prescription drug management:  I ordered medication including zofran  for nausea  Reevaluation of the patient after these medicines showed that the patient improved I have reviewed the patients home medicines and have made adjustments as needed   Problem List / ED Course:  Nausea:  pt is feeling better after zofran.  She is able to eat applesauce.  She has no evidence of infection.  She is given a rx for zofran.  I have asked the snf to feed her soft foods.  Pt is to return if worse.  F/u with pcp.   Reevaluation:  After the interventions noted above, I reevaluated the patient and found that they have :improved   Social Determinants of Health:  Hospice pt/snf   Dispostion:  After consideration of the diagnostic results and the patients response to treatment, I feel that the patent would benefit from discharge with outpatient f/u.          Final Clinical Impression(s) / ED Diagnoses Final diagnoses:  Nausea  Hospice care patient    Rx / DC Orders ED Discharge Orders          Ordered    ondansetron (ZOFRAN-ODT) 4 MG disintegrating tablet  Every 8 hours PRN        06/27/22 1316               Isla Pence, MD 06/27/22 1318

## 2022-06-27 NOTE — ED Notes (Signed)
[facility] called  

## 2022-07-31 ENCOUNTER — Emergency Department (HOSPITAL_COMMUNITY)

## 2022-07-31 ENCOUNTER — Emergency Department (HOSPITAL_COMMUNITY)
Admission: EM | Admit: 2022-07-31 | Discharge: 2022-07-31 | Disposition: A | Discharge status: Skilled Nursing Facility | Attending: Emergency Medicine | Admitting: Emergency Medicine

## 2022-07-31 ENCOUNTER — Encounter (HOSPITAL_COMMUNITY): Payer: Self-pay | Admitting: Emergency Medicine

## 2022-07-31 DIAGNOSIS — I10 Essential (primary) hypertension: Secondary | ICD-10-CM | POA: Insufficient documentation

## 2022-07-31 DIAGNOSIS — I2510 Atherosclerotic heart disease of native coronary artery without angina pectoris: Secondary | ICD-10-CM | POA: Diagnosis not present

## 2022-07-31 DIAGNOSIS — M25552 Pain in left hip: Secondary | ICD-10-CM | POA: Insufficient documentation

## 2022-07-31 DIAGNOSIS — W06XXXA Fall from bed, initial encounter: Secondary | ICD-10-CM | POA: Diagnosis not present

## 2022-07-31 DIAGNOSIS — J449 Chronic obstructive pulmonary disease, unspecified: Secondary | ICD-10-CM | POA: Diagnosis not present

## 2022-07-31 DIAGNOSIS — W19XXXA Unspecified fall, initial encounter: Secondary | ICD-10-CM

## 2022-07-31 LAB — BASIC METABOLIC PANEL
Anion gap: 9 (ref 5–15)
BUN: 9 mg/dL (ref 8–23)
CO2: 30 mmol/L (ref 22–32)
Calcium: 9.1 mg/dL (ref 8.9–10.3)
Chloride: 99 mmol/L (ref 98–111)
Creatinine, Ser: 0.83 mg/dL (ref 0.44–1.00)
GFR, Estimated: >60 mL/min (ref >60)
Glucose, Bld: 123 mg/dL — ABNORMAL HIGH (ref 70–99)
Potassium: 4 mmol/L (ref 3.5–5.1)
Sodium: 138 mmol/L (ref 135–145)

## 2022-07-31 LAB — CBC
HCT: 39.9 % (ref 36.0–46.0)
Hemoglobin: 13 g/dL (ref 12.0–15.0)
MCH: 30.8 pg (ref 26.0–34.0)
MCHC: 32.6 g/dL (ref 30.0–36.0)
MCV: 94.5 fL (ref 80.0–100.0)
Platelets: 151 10*3/uL (ref 150–400)
RBC: 4.22 MIL/uL (ref 3.87–5.11)
RDW: 14.5 % (ref 11.5–15.5)
WBC: 8.4 10*3/uL (ref 4.0–10.5)
nRBC: 0 % (ref 0.0–0.2)

## 2022-07-31 MED ORDER — LORAZEPAM 1 MG PO TABS
1.0000 mg | ORAL_TABLET | Freq: Once | ORAL | Status: AC
  Administered 2022-07-31: 1 mg via ORAL
  Filled 2022-07-31: qty 1

## 2022-07-31 MED ORDER — HYDROMORPHONE HCL 1 MG/ML IJ SOLN
1.0000 mg | Freq: Once | INTRAMUSCULAR | Status: DC
  Filled 2022-07-31: qty 1

## 2022-07-31 MED ORDER — HYDROMORPHONE HCL 1 MG/ML IJ SOLN
1.0000 mg | Freq: Once | INTRAMUSCULAR | Status: AC
  Administered 2022-07-31: 1 mg via INTRAVENOUS

## 2022-07-31 NOTE — ED Notes (Signed)
[facility] called for transport.

## 2022-07-31 NOTE — ED Provider Notes (Signed)
[facility] Emergency Department Provider Note MRN:  [id number]  Arrival date & time: 07/31/22     Chief Complaint   Hip Pain and Fall   History of Present Illness   Shirley Leblanc is a 77 y.o. year-old female with a history of CAD, COPD, hypertension presenting to the ED with chief complaint of hip pain and fall.  Slipped and fell off the bed onto left hip.  Denies head trauma or loss of consciousness, no neck or back pain, no chest pain or shortness of breath, no abdominal pain.  Isolated left hip pain that is severe.  Review of Systems  A thorough review of systems was obtained and all systems are negative except as noted in the HPI and PMH.   Patient's Health History    Past Medical History:  Diagnosis Date   Bursitis    CAD (coronary artery disease)    a. nonobstructive CAD with 40% Prox LAD stenosis by cath in 2007   COPD (chronic obstructive pulmonary disease) (HCC)    Depression    DJD (degenerative joint disease)    GERD (gastroesophageal reflux disease)    Hyperlipemia    Hypertension    Left wrist fracture    Obesities, morbid ([place])    Open left ankle fracture    Syncope 11/2017   Urinary incontinence     Past Surgical History:  Procedure Laterality Date   CARDIAC CATHETERIZATION     CHOLECYSTECTOMY N/A 05/14/2015   Procedure: LAPAROSCOPIC CHOLECYSTECTOMY;  Surgeon: Coralie Keens, MD;  Location: [place];  Service: General;  Laterality: N/A;   ESOPHAGOGASTRODUODENOSCOPY (EGD) WITH PROPOFOL N/A 11/11/2019   Procedure: ESOPHAGOGASTRODUODENOSCOPY (EGD) WITH PROPOFOL;  Surgeon: Carol Ada, MD;  Location: [facility];  Service: Endoscopy;  Laterality: N/A;   NECK SURGERY     SAVORY DILATION N/A 11/11/2019   Procedure: SAVORY DILATION;  Surgeon: Carol Ada, MD;  Location: [facility];  Service: Endoscopy;  Laterality: N/A;   TOTAL KNEE ARTHROPLASTY     x2   VAGINAL HYSTERECTOMY      Family History  Problem Relation Age of Onset    Lung disease Mother    Heart attack Father    Lung disease Other    Heart attack Other    Cancer Daughter        breast    Social History   Socioeconomic History   Marital status: Widowed    Spouse name: Not on file   Number of children: 4   Years of education: Not on file   Highest education level: Not on file  Occupational History   Occupation: RETIRED  Tobacco Use   Smoking status: Never   Smokeless tobacco: Never  Vaping Use   Vaping Use: Never used  Substance and Sexual Activity   Alcohol use: Yes    Comment: Occasionally    Drug use: No   Sexual activity: Never    Birth control/protection: None, Post-menopausal  Other Topics Concern   Not on file  Social History Narrative   Not on file   Social Determinants of Health   Financial Resource Strain: Not on file  Food Insecurity: Not on file  Transportation Needs: Not on file  Physical Activity: Not on file  Stress: Not on file  Social Connections: Not on file  Intimate Partner Violence: Not on file     Physical Exam   Vitals:   07/31/22 0120  BP: 139/72  Pulse: 93  Resp: 18  Temp: 97.9 F (  36.6 C)  SpO2: 95%    CONSTITUTIONAL: Well-appearing, NAD NEURO/PSYCH:  Alert and oriented x 3, no focal deficits EYES:  eyes equal and reactive ENT/NECK:  no LAD, no JVD CARDIO: Regular rate, well-perfused, normal S1 and S2 PULM:  CTAB no wheezing or rhonchi GI/GU:  non-distended, non-tender MSK/SPINE:  No gross deformities, no edema SKIN:  no rash, atraumatic   *Additional and/or pertinent findings included in MDM below  Diagnostic and Interventional Summary    EKG Interpretation  Date/Time:    Ventricular Rate:    PR Interval:    QRS Duration:   QT Interval:    QTC Calculation:   R Axis:     Text Interpretation:         Labs Reviewed  BASIC METABOLIC PANEL - Abnormal; Notable for the following components:      Result Value   Glucose, Bld 123 (*)    All other components within normal limits   CBC    DG Hip Unilat W or Wo Pelvis 2-3 Views Left  Final Result      Medications  LORazepam (ATIVAN) tablet 1 mg (has no administration in time range)  HYDROmorphone (DILAUDID) injection 1 mg (1 mg Intravenous Given 07/31/22 0353)     Procedures  /  Critical Care Procedures  ED Course and Medical Decision Making  Initial Impression and Ddx Suspicious for fracture or dislocation of the left hip.  Awaiting x-ray.  The limb is neurovascularly intact.  Past medical/surgical history that increases complexity of ED encounter: CAD, COPD  Interpretation of Diagnostics I personally reviewed the hip x-ray and my interpretation is as follows: No fracture  Labs reassuring with no significant blood count or electrolyte disturbance.  Patient Reassessment and Ultimate Disposition/Management     Patient is largely nonambulatory at baseline.  I am able to range the hip and knee without eliciting pain and so doubt occult fracture.  Appropriate for discharge.  Patient management required discussion with the following services or consulting groups:  None  Complexity of Problems Addressed Acute illness or injury that poses threat of life of bodily function  Additional Data Reviewed and Analyzed Further history obtained from: Further history from spouse/family member  Additional Factors Impacting ED Encounter Risk None  Barth Kirks. [place] Small, [place] [email or web address]  Final Clinical Impressions(s) / ED Diagnoses     ICD-10-CM   1. Fall, initial encounter  W19.XXXA     2. Pain of left hip  M25.552       ED Discharge Orders     None        Discharge Instructions Discussed with and Provided to Patient:     Discharge Instructions      You were evaluated in the Emergency Department and after careful evaluation, we did not find any emergent condition requiring admission or further testing in the hospital.  Your  exam/testing today is overall reassuring.  X-ray did not show any broken bones.  Recommend Tylenol at home for discomfort.  Please return to the Emergency Department if you experience any worsening of your condition.   Thank you for allowing [place] to be a part of your care.       Maudie Flakes, MD 07/31/22 [phone]

## 2022-07-31 NOTE — ED Triage Notes (Signed)
Pt comes in from [facility]. Pt is covered by [employer]. Pt is currently on hospice. Pt fell yesterday denies hitting head. [facility] called for transport as pt was complaining of hip pain.   No deformity  A and o x2   Bp - 160 palpated HR- 72 Spo2- 94%

## 2022-07-31 NOTE — Discharge Instructions (Addendum)
You were evaluated in the Emergency Department and after careful evaluation, we did not find any emergent condition requiring admission or further testing in the hospital.  Your exam/testing today is overall reassuring.  X-ray did not show any broken bones.  Recommend Tylenol at home for discomfort.  Please return to the Emergency Department if you experience any worsening of your condition.   Thank you for allowing [place] to be a part of your care.

## 2022-08-28 DEATH — deceased
